# Patient Record
Sex: Male | Born: 1950 | Race: Black or African American | Hispanic: No | Marital: Married | Smoking: Former smoker
Health system: Southern US, Community
[De-identification: ages and names within clinical notes are randomized; demographics above are authoritative.]

## PROBLEM LIST (undated history)

## (undated) DIAGNOSIS — E039 Hypothyroidism, unspecified: Secondary | ICD-10-CM

## (undated) DIAGNOSIS — T7840XA Allergy, unspecified, initial encounter: Secondary | ICD-10-CM

## (undated) DIAGNOSIS — E669 Obesity, unspecified: Secondary | ICD-10-CM

## (undated) DIAGNOSIS — R112 Nausea with vomiting, unspecified: Secondary | ICD-10-CM

## (undated) DIAGNOSIS — I10 Essential (primary) hypertension: Secondary | ICD-10-CM

## (undated) DIAGNOSIS — E119 Type 2 diabetes mellitus without complications: Secondary | ICD-10-CM

## (undated) DIAGNOSIS — G4730 Sleep apnea, unspecified: Secondary | ICD-10-CM

## (undated) DIAGNOSIS — M1990 Unspecified osteoarthritis, unspecified site: Secondary | ICD-10-CM

## (undated) DIAGNOSIS — Z9889 Other specified postprocedural states: Secondary | ICD-10-CM

## (undated) DIAGNOSIS — E785 Hyperlipidemia, unspecified: Secondary | ICD-10-CM

## (undated) DIAGNOSIS — G4733 Obstructive sleep apnea (adult) (pediatric): Secondary | ICD-10-CM

## (undated) DIAGNOSIS — R079 Chest pain, unspecified: Secondary | ICD-10-CM

## (undated) DIAGNOSIS — K219 Gastro-esophageal reflux disease without esophagitis: Secondary | ICD-10-CM

## (undated) HISTORY — DX: Sleep apnea, unspecified: G47.30

## (undated) HISTORY — DX: Unspecified osteoarthritis, unspecified site: M19.90

## (undated) HISTORY — PX: SPINE SURGERY: SHX786

## (undated) HISTORY — DX: Hyperlipidemia, unspecified: E78.5

## (undated) HISTORY — DX: Type 2 diabetes mellitus without complications: E11.9

## (undated) HISTORY — PX: CERVICAL FUSION: SHX112

## (undated) HISTORY — DX: Essential (primary) hypertension: I10

## (undated) HISTORY — DX: Allergy, unspecified, initial encounter: T78.40XA

## (undated) HISTORY — DX: Chest pain, unspecified: R07.9

## (undated) HISTORY — DX: Obesity, unspecified: E66.9

## (undated) HISTORY — PX: CARDIAC CATHETERIZATION: SHX172

## (undated) HISTORY — DX: Obstructive sleep apnea (adult) (pediatric): G47.33

## (undated) HISTORY — PX: HERNIA REPAIR: SHX51

---

## 1982-05-31 HISTORY — PX: KNEE ARTHROSCOPY: SUR90

## 1999-08-14 ENCOUNTER — Encounter: Payer: Self-pay | Admitting: Interventional Cardiology

## 1999-08-14 ENCOUNTER — Emergency Department (HOSPITAL_COMMUNITY): Admission: EM | Admit: 1999-08-14 | Discharge: 1999-08-14 | Payer: Self-pay | Admitting: Emergency Medicine

## 1999-08-25 ENCOUNTER — Ambulatory Visit (HOSPITAL_COMMUNITY): Admission: RE | Admit: 1999-08-25 | Discharge: 1999-08-25 | Payer: Self-pay | Admitting: Interventional Cardiology

## 2008-04-18 ENCOUNTER — Encounter: Admission: RE | Admit: 2008-04-18 | Discharge: 2008-04-18 | Payer: Self-pay | Admitting: Family Medicine

## 2009-05-15 ENCOUNTER — Ambulatory Visit: Payer: Self-pay | Admitting: Vascular Surgery

## 2009-07-16 ENCOUNTER — Ambulatory Visit (HOSPITAL_COMMUNITY): Admission: RE | Admit: 2009-07-16 | Discharge: 2009-07-17 | Payer: Self-pay | Admitting: Orthopaedic Surgery

## 2009-09-19 ENCOUNTER — Emergency Department (HOSPITAL_COMMUNITY): Admission: EM | Admit: 2009-09-19 | Discharge: 2009-09-19 | Payer: Self-pay | Admitting: Emergency Medicine

## 2010-01-02 ENCOUNTER — Emergency Department (HOSPITAL_COMMUNITY): Admission: EM | Admit: 2010-01-02 | Discharge: 2010-01-02 | Payer: Self-pay | Admitting: Emergency Medicine

## 2010-08-14 LAB — COMPREHENSIVE METABOLIC PANEL
AST: 24 U/L (ref 0–37)
Albumin: 3.9 g/dL (ref 3.5–5.2)
Alkaline Phosphatase: 64 U/L (ref 39–117)
BUN: 8 mg/dL (ref 6–23)
CO2: 28 mEq/L (ref 19–32)
GFR calc non Af Amer: >60 mL/min (ref >60)
Glucose, Bld: 148 mg/dL — ABNORMAL HIGH (ref 70–99)
Potassium: 3.7 mEq/L (ref 3.5–5.1)
Sodium: 137 mEq/L (ref 135–145)
Total Bilirubin: 0.6 mg/dL (ref 0.3–1.2)

## 2010-08-14 LAB — URINE MICROSCOPIC-ADD ON

## 2010-08-14 LAB — URINALYSIS, ROUTINE W REFLEX MICROSCOPIC
Glucose, UA: NEGATIVE mg/dL
Ketones, ur: NEGATIVE mg/dL
Urobilinogen, UA: 0.2 mg/dL (ref 0.0–1.0)

## 2010-08-14 LAB — CULTURE, BLOOD (ROUTINE X 2): Culture: NO GROWTH

## 2010-08-14 LAB — DIFFERENTIAL
Basophils Relative: 0 % (ref 0–1)
Lymphs Abs: 1 10*3/uL (ref 0.7–4.0)
Monocytes Absolute: 0.9 10*3/uL (ref 0.1–1.0)
Neutrophils Relative %: 82 % — ABNORMAL HIGH (ref 43–77)

## 2010-08-14 LAB — CBC
MCV: 93.9 fL (ref 78.0–100.0)
Platelets: 151 10*3/uL (ref 150–400)
RBC: 4.59 MIL/uL (ref 4.22–5.81)
RDW: 13.5 % (ref 11.5–15.5)

## 2010-08-18 LAB — DIFFERENTIAL
Basophils Absolute: 0 10*3/uL (ref 0.0–0.1)
Eosinophils Relative: 3 % (ref 0–5)
Lymphs Abs: 2.4 10*3/uL (ref 0.7–4.0)
Monocytes Relative: 8 % (ref 3–12)
Neutrophils Relative %: 45 % (ref 43–77)

## 2010-08-18 LAB — BASIC METABOLIC PANEL
BUN: 6 mg/dL (ref 6–23)
Calcium: 9 mg/dL (ref 8.4–10.5)
Chloride: 106 mEq/L (ref 96–112)
Creatinine, Ser: 0.84 mg/dL (ref 0.4–1.5)
GFR calc Af Amer: >60 mL/min (ref >60)
Potassium: 4.2 mEq/L (ref 3.5–5.1)
Sodium: 141 mEq/L (ref 135–145)

## 2010-08-18 LAB — D-DIMER, QUANTITATIVE: D-Dimer, Quant: 0.33 ug/mL-FEU (ref 0.00–0.48)

## 2010-08-18 LAB — CBC
HCT: 40.5 % (ref 39.0–52.0)
Hemoglobin: 13.7 g/dL (ref 13.0–17.0)
MCHC: 33.8 g/dL (ref 30.0–36.0)

## 2010-08-18 LAB — POCT CARDIAC MARKERS
CKMB, poc: 1.2 ng/mL (ref 1.0–8.0)
Troponin i, poc: <0.05 ng/mL (ref 0.00–0.09)

## 2010-08-21 LAB — COMPREHENSIVE METABOLIC PANEL
ALT: 27 U/L (ref 0–53)
AST: 19 U/L (ref 0–37)
Albumin: 3.9 g/dL (ref 3.5–5.2)
CO2: 26 mEq/L (ref 19–32)
Calcium: 8.6 mg/dL (ref 8.4–10.5)
GFR calc Af Amer: >60 mL/min (ref >60)
GFR calc non Af Amer: >60 mL/min (ref >60)
Potassium: 3.9 mEq/L (ref 3.5–5.1)
Sodium: 135 mEq/L (ref 135–145)
Total Protein: 6.9 g/dL (ref 6.0–8.3)

## 2010-08-21 LAB — CBC
HCT: 42.7 % (ref 39.0–52.0)
MCHC: 34.6 g/dL (ref 30.0–36.0)
Platelets: 150 10*3/uL (ref 150–400)
RBC: 4.5 MIL/uL (ref 4.22–5.81)
RDW: 12.9 % (ref 11.5–15.5)

## 2010-08-21 LAB — URINALYSIS, ROUTINE W REFLEX MICROSCOPIC
Bilirubin Urine: NEGATIVE
Glucose, UA: NEGATIVE mg/dL
Hgb urine dipstick: NEGATIVE
Ketones, ur: NEGATIVE mg/dL
Nitrite: NEGATIVE
Protein, ur: NEGATIVE mg/dL
Specific Gravity, Urine: 1.022 (ref 1.005–1.030)
pH: 7.5 (ref 5.0–8.0)

## 2010-08-21 LAB — GLUCOSE, CAPILLARY

## 2010-10-13 NOTE — Procedures (Signed)
CAROTID DUPLEX EXAM   INDICATION:  Vertigo, dizziness.   HISTORY:  Diabetes:  Yes.  Cardiac:  No.  Hypertension:  Yes.  Smoking:  Previous.  Quit 15 years ago.  Previous Surgery:  No.  CV History:  No.  Amaurosis Fugax No, Paresthesias No, Hemiparesis No                                       RIGHT             LEFT  Brachial systolic pressure:         118               110  Brachial Doppler waveforms:         WNL               WNL  Vertebral direction of flow:        Antegrade         Antegrade  DUPLEX VELOCITIES (cm/sec)  CCA peak systolic                   103               119  ECA peak systolic                   110               104  ICA peak systolic                   83                85  ICA end diastolic                   16                37  PLAQUE MORPHOLOGY:                  Heterogeneous     Heterogeneous  PLAQUE AMOUNT:                      Mild              Mild  PLAQUE LOCATION:                    ICA, CCA          ICA    IMPRESSION:  1. Bilateral internal carotid arteries suggest 1%-39% stenosis.  2. Antegrade flow in bilateral vertebrals.        ___________________________________________  Di Kindle. Edilia Bo, M.D.   CB/MEDQ  D:  05/15/2009  T:  05/15/2009  Job:  [id number]

## 2010-10-13 NOTE — Consult Note (Signed)
VASCULAR SURGERY CONSULTATION   Gregory West, Gregory West A  DOB:  Dec 19, 1950                                       05/15/2009  ZOXWR#:[id number]   I saw the patient in the office today in consultation concerning his  dizziness.  He was referred by Dr. Zachery Dauer.  This is a pleasant 60-year-  old gentleman who states that he noted the gradual onset of dizziness  approximately a year ago.  Over the last 3-6 months his symptoms have  been progressively worse.  He states that he generally experiences  dizziness when he turns his head to the left side.  There are really no  other aggravating or alleviating factors that he is aware of.  He does  have a history of neck pain and has had previous surgery in his neck  back in 1997.  He believes he had a fusion in his neck with bone taken  from the hip.  He has had no history of stroke, TIAs, expressive or  receptive aphasia or amaurosis fugax.  He is unaware of any history of  anemia or inner ear problems.   PAST MEDICAL HISTORY:  Significant for type 2 diabetes.  He does take  insulin.  He denies any history of hypertension, hypercholesterolemia,  history of previous myocardial infarction, history of congestive heart  failure or history of COPD.   PAST SURGICAL HISTORY:  Significant for neck surgery in 1997, surgery on  both feet in 1989, surgery on both knees in 1984 and a hernia repair in  1972.   FAMILY HISTORY:  His mother had a stroke at age 77 and again at age 13.  He is unaware of any history of premature cardiovascular disease.   SOCIAL HISTORY:  He is married.  He has 2 children.  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  He quit tobacco in 1995.  He had  smoked up to 2 packs per day for 10 years and smoked for a total of 30  years total.   ALLERGIES:  Are Crestor and Lipitor which cause myalgias.   MEDICATIONS:  Are documented on the medical history form in his chart.   REVIEW OF SYSTEMS:  GENERAL:  He  has had no weight loss, weight gain or  problems with his appetite.  CARDIOVASCULAR:  He has had no chest pain, chest pressure, palpitations  or arrhythmias.  He has had some mild claudication in his legs with no  rest pain or history of nonhealing ulcers.  He has had no history of DVT  or phlebitis.  GU:  He has occasional urinary frequency.  NEUROLOGIC:  He has occasional headaches in the posterior aspect of his  head and occasional frontal headaches.  He has had no blackouts or  seizures.  He has had the dizziness as stated above.  MUSCULOSKELETAL:  He has had arthritis and joint pain.  ENT:  He has had occasional nosebleeds.  Pulmonary, GI, psychiatric, hematologic, skin review of systems is  unremarkable and is documented on the medical history form in his chart.   PHYSICAL EXAMINATION:  General:  This is a pleasant 60 year old  gentleman who appears his stated age.  Vital signs:  Blood pressure is  102/67 in the right arm and 109/72 in the left arm.  Heart rate is 73,  temperature is 98.  HEENT:  Pupils are equal, round, reactive to light  and accommodation.  Extraocular motions are intact.  Conjunctivae are  normal.  Neck:  There is no jugular venous distention.  Lungs:  Are  clear bilaterally to auscultation without rales, rhonchi or wheezing.  Cardiovascular:  I do not detect any carotid bruits.  He has a regular  rate and rhythm without murmur or gallop appreciated.  He has no  significant peripheral edema.  He has palpable radial, femoral and  posterior tibial pulses bilaterally.  Abdomen:  Soft and nontender with  no masses appreciated.  No aneurysm appreciated.  He has normal pitched  bowel sounds.  Musculoskeletal:  There are no major deformities or  cyanosis.  Neurological:  He has no focal weakness or paresthesias.  Skin:  There are no ulcers or rashes.  Lymphatic:  He has no significant  cervical, axillary or inguinal lymphadenopathy.   He did have a carotid duplex  scan in our office today which I  independently interpreted and this shows essentially equal arm pressures  with normally directed flow in both vertebral arteries.  Thus there was  no evidence of vertebrobasilar insufficiency.  He has no evidence of  carotid stenosis on either side.  I have also reviewed his previous  laboratory evaluation done by his primary care physician which shows a  cholesterol level of 199 with an LDL of 130.  His creatinine is 0.9.   IMPRESSION:  Based on his carotid duplex scan he has no evidence of  vertebrobasilar insufficiency or subclavian steal syndrome.  He has no  carotid disease either.  Thus I think it is unlikely that he has a  vascular source for his dizziness although it is somewhat suspicious  that he experienced these symptoms only when he turns his head to the  left side.  I think more likely his symptoms could be related to his  disk problems in his neck and he is going to try to follow up with his  orthopedic surgeon concerning this.  This could also potentially explain  his headaches.  If no other potential source for his dizziness could be  found the only other potential test we could consider would be a  cerebral arteriogram in which we could obtain images with him neutral  and then with his head turned to the left to see if there was any  evidence of kinking although I think the yield of this would be quite  low.  In addition, this would be associated with small risk of cerebral  arteriography including a 1-2% risk of stroke.  He is agreeable with  this plan.  I will be happy to see him back at any time if his symptoms  persist and no other etiology can be found.   Di Kindle. Edilia Bo, M.D.  Electronically Signed  CSD/MEDQ  D:  05/15/2009  T:  05/16/2009  Job:  [id number]   cc:   Dossie Der, MD

## 2010-10-16 NOTE — Cardiovascular Report (Signed)
[place]. [facility]  Patient:    Gregory West, Gregory West                   MRN: [id number] Proc. Date: 08/25/99 Attending:  Darci Needle, M.D. CC:         Anna Genre. Little, M.D.                        Cardiac Catheterization  CINE NUMBER:  [id number]  PROCEDURE: 1. Left heart catheterization. 2. Selective coronary angiography. 3. Left ventriculography.  INDICATIONS:  [name] is 60 years of age, and has been having atypical chest pain.  He was referred for a Cardiolite study that demonstrated perfusion abnormalities that probably represented soft tissue attenuation, although with continued symptoms with indeterminate scan, and risks factors for coronary disease, it was felt that coronary angiography was necessary to exclude important coronary obstructive disease.  DESCRIPTION OF PROCEDURE:  After informed consent, a 6-French sheath was inserted into the right femoral artery using the modified Seldinger technique.  A 6-French A2 multipurpose catheter was then used for hemodynamic recordings, left ventriculography, and selective left and right coronary angiography.  Perclose as used for percutaneous arteriotomy closure.  No complications occurred.  1 g of Ancef was given IV postprocedure.  RESULTS:  HEMODYNAMIC DATA:  Aortic pressure 111/68, left ventricular pressure 114/17 mmHg.  LEFT VENTRICULOGRAPHY:  The left ventricle was normal size and demonstrated normal contractility with ejection fraction of 65%.  SELECTIVE CORONARY ANGIOGRAPHY:  Left main coronary normal.  Left anterior descending coronary - the left anterior descending coronary artery was a large vessel that wraps around the left ventricular apex.  It gives origin to one significant diagonal branch, and two smaller more difficult diagonal branches. The entire LAD system is normal.  Ramus branch - a large bifurcating ramus intermedius coronary arises from  the left main and was free of any significant obstruction.  Circumflex artery - this vessel is large and gives origin to three moderate size obtuse marginal branches.  The circumflex system is normal.  Right coronary artery is relatively small.  Small PDA arises distally.  The vessel is free of any coronary atherosclerosis.  CONCLUSIONS: 1. Normal coronary arteries. 2. Normal left ventricular function.  RECOMMENDATIONS:  No further cardiac evaluation.  Suspect that chest discomfort is gastrointestinal or musculoskeletal.  No evidence of coronary disease was identified. DD:  08/25/99 TD:  08/25/99 Job: [id number]

## 2010-10-16 NOTE — H&P (Signed)
[place]. [facility]  Patient:    Gregory West, Gregory West                 MRN: [id number] Adm. Date:  [id number] Attending:  Lyn Records. Iii Dictator:   Anselm Lis, N.P. CC:         Anna Genre. Little, M.D., [facility]                         History and Physical  DATE OF PROCEDURE:  August 25, 1999.  HISTORY OF PRESENT ILLNESS:  Gregory West is a very pleasant 60 year old male  with possible history for dyslipidemia who was experiencing atypical chest discomfort in right anterior chest which radiates to the substernal area and is  described as sharp, initially transient but then increasing in duration, though it does have a waxing and waning quality to it.  About one month ago, however, he began  experiencing more of a typical anginal symptoms of left anterior chest pressure though not particularly associated with activity.  It can occur with exertion.  He has noted increasing wakening fatigue over the last 12 years possibly related to symptoms suspicious for sleep apnea.  The patient had followup stress Cardiolite in clinic which was indeterminate for CAD, although there was some evidence of possible inferior wall and anterior wall possibly related to attenuation, though ischemia could not be excluded.  PAST MEDICAL HISTORY:  Possible sleep apnea; investigation for this is pending completion of this cardiac workup.  He denies history of hypertension, diabetes mellitus, thyroid disease, and asthma.  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  ALLERGIES:  MULBERRY TREES.  Okay with seafood/shellfish and ______ products. No known allergies to medications.  MEDICATIONS:  Protonix 40 mg p.o. q.d.  SOCIAL HISTORY AND HABITS:  He has been married for 25 years and has a daughter and son  alive and well.  He works in Materials engineer, often working off site. Tobacco: Negative.  ETOH: Negative.  Caffeine: Not excessive.  FAMILY HISTORY:  His dad is 63 and has Parkinsons disease.  His mother is 41, has history of diabetes mellitus and hypothyroidism/goiter.  His mom did have a mild heart attack in her mid 15s.  He has three sisters without CAD.  One brother died of suicide at age 46.  REVIEW OF SYSTEMS:  Episodic lightheadedness associated with fast position changes, otherwise no significant complaints.  Negative dysphagia to food or fluid.  No complaints of diarrhea, melena, nor bright red blood per rectum.  No dysuria nor hematuria.  He denies arthritic-type complaints.  The patient does suffer from daytime fatigue; his wife states he snores wildly at night, and he is currently  undergoing workup for sleep apnea.  Of most concern to his family is increased pigmentation on his face and lower extremities noted to be gradually progressive since his 26s.  PHYSICAL EXAMINATION:  VITAL SIGNS:  Blood pressure 127/69, heart rate 78, respiratory rate 20, temperature 97.9.  He is 5 feet 9-1/2 inches and weighs 290 pounds.  GENERAL:  He is a moderately overweight, 60 year old male in no apparent distress. He is accompanied today by his wife and daughter.  NECK:  Brisk bilateral upstrokes without bruit.  No significant JVD nor thyromegaly.  CHEST:  Lung sounds clear with equal bilateral excursion.  Negative CVA tenderness.  CARDIAC:  Regular rate and rhythm without murmur, rub, or gallop.  Normal S1 and S2.  ABDOMEN:  Soft, nondistended, normoactive bowel sounds.  Negative abdominal aortic, renal, and femoral bruit.  EXTREMITIES:   Distal pulses intact.  Alert and oriented x 3.  GENITOURINARY/RECTAL:  Deferred.  LABORATORY TESTS AND DATA:  Stress Cardiolite from August 18, 1999, indeterminate with decreased uptake in the inferior wall and the anterior  wall, possibly related to attenuation, although myocardial ischemia could not be excluded particularly  involving the inferior wall.  Normal left ventricular function with ejection fraction of 55%.  Chest x-ray from August 14, 1999, was without acute abnormality.  Lab work from August 24, 1999, revealed glucose 95, BUN 10, creatinine 1.0, sodium 138, potassium 4.1, chloride 100, CO2 29, calcium 8.7.  LFTs within normal range though alkaline phosphatase slightly elevated at 66.  Hemoglobin 15.3, hematocrit 44.9, platelets 179, WBC 4.3.  Pro time 11.9 with INR 1.03 and PTT of 33.  His EKG reveals normal sinus rhythm at 78 beats per minute without ischemic changes.  IMPRESSION: 1. Chest discomfort, atypical and typical features for angina pectoris in this    60 year old obese male with possible history of dyslipidemia and with    borderline suspicious followup myocardial scan. 2. Question of dyslipidemia. 3. ______ .  PLAN:  The patient has been counselled to undergo and has accepted plans for coronary angiography with possible percutaneous intervention if indicated and able.  Risks, potential complications, benefits, and alternatives of the procedure were discussed in detail with the patient and his wife.  The Cunninghams indicate their questions and concerns have been addressed and are agreeable to proceed. DD:  08/25/99 TD:  08/25/99 Job: [id number]

## 2011-08-30 ENCOUNTER — Other Ambulatory Visit: Payer: Self-pay | Admitting: Endocrinology

## 2011-08-30 DIAGNOSIS — E291 Testicular hypofunction: Secondary | ICD-10-CM

## 2011-09-06 ENCOUNTER — Ambulatory Visit
Admission: RE | Admit: 2011-09-06 | Discharge: 2011-09-06 | Disposition: A | Discharge status: Home / Self Care | Payer: 59 | Source: Ambulatory Visit | Attending: Endocrinology | Admitting: Endocrinology

## 2011-09-06 DIAGNOSIS — E291 Testicular hypofunction: Secondary | ICD-10-CM

## 2011-09-06 MED ORDER — GADOBENATE DIMEGLUMINE 529 MG/ML IV SOLN
15.0000 mL | Freq: Once | INTRAVENOUS | Status: AC | PRN
  Administered 2011-09-06: 15 mL via INTRAVENOUS

## 2011-10-21 DIAGNOSIS — H40009 Preglaucoma, unspecified, unspecified eye: Secondary | ICD-10-CM | POA: Insufficient documentation

## 2012-05-17 ENCOUNTER — Encounter: Payer: Self-pay | Admitting: Vascular Surgery

## 2013-12-24 ENCOUNTER — Ambulatory Visit (INDEPENDENT_AMBULATORY_CARE_PROVIDER_SITE_OTHER): Payer: 59 | Admitting: Family Medicine

## 2013-12-24 ENCOUNTER — Ambulatory Visit (INDEPENDENT_AMBULATORY_CARE_PROVIDER_SITE_OTHER): Payer: 59

## 2013-12-24 VITALS — BP 118/80 | HR 85 | Temp 97.7°F | Resp 18 | Ht 70.5 in | Wt 287.2 lb

## 2013-12-24 DIAGNOSIS — S93601A Unspecified sprain of right foot, initial encounter: Secondary | ICD-10-CM

## 2013-12-24 DIAGNOSIS — M79671 Pain in right foot: Secondary | ICD-10-CM

## 2013-12-24 DIAGNOSIS — S93609A Unspecified sprain of unspecified foot, initial encounter: Secondary | ICD-10-CM

## 2013-12-24 DIAGNOSIS — M79609 Pain in unspecified limb: Secondary | ICD-10-CM

## 2013-12-24 MED ORDER — TRAMADOL HCL 50 MG PO TABS: 50.0000 mg | ORAL_TABLET | Freq: Three times a day (TID) | ORAL | Status: DC | PRN

## 2013-12-24 NOTE — Progress Notes (Addendum)
[facility] [address], [place] [address] [phone]  Date:  12/24/2013   Name:  Gregory ReadyRichard A West   DOB:  1950/08/08   MRN:  [id number]  PCP:  No primary provider on file.    Chief Complaint: Foot Injury   History of Present Illness:  Gregory West is a 63 y.o. very pleasant male patient who presents with the following:  Here today as a new pt with a foot injury.  He was playing softball yestreday and hurt his right foot and ankle.  He was trying to catch a fly ball and somehow hurt his right foot and ankle.  He is not quite sure of what he did but he had immediate pain, and now has pain mostly in the lateral foot.   He can walk on it "barely."  He is limping He has had surgery on his right foot for a congenital problem in 1989; he has a pin somewhere.  Not quite sure what type of surgery he had. No other injury.    He sees Dr. Osa CraverAlthmeimer for his DM.   There are no active problems to display for this patient.   Past Medical History  Diagnosis Date  . Arthritis   . Diabetes mellitus without complication     Past Surgical History  Procedure Laterality Date  . Hernia repair    . Knee arthroscopy  1984    both knee    History  Substance Use Topics  . Smoking status: Never Smoker   . Smokeless tobacco: Not on file  . Alcohol Use: No    Family History  Problem Relation Age of Onset  . Diabetes Mother   . Heart disease Mother   . Hypertension Mother   . Stroke Mother   . Cancer Father   . Heart disease Father   . Hyperlipidemia Sister   . Hypertension Sister   . Diabetes Maternal Grandmother   . Stroke Maternal Grandmother   . Diabetes Maternal Grandfather   . Hypertension Maternal Grandfather   . Diabetes Sister   . Hypertension Sister     Allergies  Allergen Reactions  . Statins     Cramps    Medication list has been reviewed and updated.  No current outpatient prescriptions on file prior to visit.   No  current facility-administered medications on file prior to visit.    Review of Systems:  As per HPI- otherwise negative.   Physical Examination: Filed Vitals:   12/24/13 1534  BP: 118/80  Pulse: 85  Temp: 97.7 F (36.5 C)  Resp: 18   Filed Vitals:   12/24/13 1534  Height: 5' 10.5" (1.791 m)  Weight: 287 lb 3.2 oz (130.273 kg)   Body mass index is 40.61 kg/(m^2). Ideal Body Weight: Weight in (lb) to have BMI = 25: 176.4  GEN: WDWN, NAD, Non-toxic, A & O x 3, obese, looks well HEENT: Atraumatic, Normocephalic. Neck supple. No masses, No LAD. Ears and Nose: No external deformity. CV: RRR, No M/G/R. No JVD. No thrill. No extra heart sounds. PULM: CTA B, no wheezes, crackles, rhonchi. No retractions. No resp. distress. No accessory muscle use. EXTR: No c/c/e NEURO favoring right foot PSYCH: Normally interactive. Conversant. Not depressed or anxious appearing.  Calm demeanor.  Right foot: quite tender over the 5th MT.  No swelling or bruise.  Mild tenderness lateral ankle.  Achilles is intact.  Normal sensation and perfusion of toes   [facility] reading (PRIMARY)  by  Dr. Patsy Lager. Right ankle: negative Right foot: post- op changes of 5th MT.  There are some calcification adjacent to the proximal 5th MT- likely chronic but cannot exclude a new avulsion fracture  RIGHT ANKLE - COMPLETE 3+ VIEW  COMPARISON: None.  FINDINGS: Three views of the right ankle submitted. No acute fracture or subluxation. Ankle mortise is preserved. There is posterior spurring of calcaneus.  IMPRESSION: Negative.  CLINICAL DATA: Right foot injury, pain.  EXAM: RIGHT FOOT COMPLETE - 3+ VIEW  COMPARISON: None.  FINDINGS: There is no acute bony or joint abnormality. Screw in the head of the fifth metatarsal is noted. 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 Soft tissue structures are unremarkable.  IMPRESSION: No acute  finding.  First MTP and midfoot degenerative change.  Postoperative change head of the fifth metatarsal.  Given crutches and a CAM boot; the boot felt very good to him Assessment and Plan: Right foot pain - Plan: DG Foot Complete Right, DG Ankle Complete Right  Right foot sprain, initial encounter - Plan: traMADol (ULTRAM) 50 MG tablet  Foot sprain with possible 5th MT avulsion fracture Felt much better in CAM boot.  Use this and crutches, tramadol as needed for more severe pain.  Follow-up if not better in the next 5-7 days.    Signed Abbe Amsterdam, MD

## 2013-12-24 NOTE — Patient Instructions (Signed)
Use your boot and crutches as needed.  Ice and elevate your foot when you can.    Please come and see me if your foot is still bothering you in about one week Use the tramadol as needed for pain- remember it can make you drowsy Do NOT drive with your boot on!

## 2013-12-25 ENCOUNTER — Encounter: Payer: Self-pay | Admitting: Family Medicine

## 2013-12-31 ENCOUNTER — Ambulatory Visit (INDEPENDENT_AMBULATORY_CARE_PROVIDER_SITE_OTHER): Payer: 59 | Admitting: Family Medicine

## 2013-12-31 VITALS — BP 104/70 | HR 77 | Temp 98.1°F | Resp 16 | Ht 70.0 in | Wt 289.0 lb

## 2013-12-31 DIAGNOSIS — Z5189 Encounter for other specified aftercare: Secondary | ICD-10-CM

## 2013-12-31 DIAGNOSIS — M25579 Pain in unspecified ankle and joints of unspecified foot: Secondary | ICD-10-CM

## 2013-12-31 DIAGNOSIS — S93601D Unspecified sprain of right foot, subsequent encounter: Secondary | ICD-10-CM

## 2013-12-31 DIAGNOSIS — Z8739 Personal history of other diseases of the musculoskeletal system and connective tissue: Secondary | ICD-10-CM

## 2013-12-31 DIAGNOSIS — Z8639 Personal history of other endocrine, nutritional and metabolic disease: Secondary | ICD-10-CM

## 2013-12-31 DIAGNOSIS — S93609A Unspecified sprain of unspecified foot, initial encounter: Secondary | ICD-10-CM

## 2013-12-31 DIAGNOSIS — M25571 Pain in right ankle and joints of right foot: Secondary | ICD-10-CM

## 2013-12-31 DIAGNOSIS — Z862 Personal history of diseases of the blood and blood-forming organs and certain disorders involving the immune mechanism: Secondary | ICD-10-CM

## 2013-12-31 LAB — POCT SEDIMENTATION RATE: POCT SED RATE: 30 mm/hr — AB (ref 0–22)

## 2013-12-31 MED ORDER — TRAMADOL HCL 50 MG PO TABS: 50.0000 mg | ORAL_TABLET | Freq: Three times a day (TID) | ORAL | Status: DC | PRN

## 2013-12-31 MED ORDER — COLCHICINE 0.6 MG PO TABS: ORAL_TABLET | ORAL | Status: DC

## 2013-12-31 NOTE — Progress Notes (Signed)
Subjective: Patient was here last week after injuring his foot and ankle when playing softball. He's been wearing his cam walker and using crutches. He did okay for a few days, then the last 2 days she's had increased pain which has been pretty severe in the fourth and fifth digits.  [facility].  Objective: The x-rays from last visit were negative. I review the films and did not see any fractures. Ankle has fair range of motion and is nontender. The heart has a little tenderness. There is mild erythema on the top of her foot wear the Cam Walker has given some pressure in 2 spots. The right fourth and fifth MTP joints are very tender.  Assessment: Suspicion for gout Foot pain History of possible gout  Plan: Colchicine RTC if not improving  Re xray if needed next week

## 2013-12-31 NOTE — Patient Instructions (Addendum)
Take colchicine 2 pills initially, then one twice daily. May cause diarrhea. If necessary decrease dose.  Use the tramadol for pain  Return if not much better over the next week please return  We will let you know the results of your labs

## 2014-01-01 LAB — URIC ACID: URIC ACID, SERUM: 3.8 mg/dL — AB (ref 4.0–7.8)

## 2014-05-15 ENCOUNTER — Encounter: Payer: Self-pay | Admitting: Interventional Cardiology

## 2014-05-15 ENCOUNTER — Ambulatory Visit (INDEPENDENT_AMBULATORY_CARE_PROVIDER_SITE_OTHER): Payer: 59 | Admitting: Interventional Cardiology

## 2014-05-15 VITALS — BP 118/62 | HR 76 | Ht 70.0 in | Wt 280.8 lb

## 2014-05-15 DIAGNOSIS — R0789 Other chest pain: Secondary | ICD-10-CM | POA: Insufficient documentation

## 2014-05-15 DIAGNOSIS — G4733 Obstructive sleep apnea (adult) (pediatric): Secondary | ICD-10-CM

## 2014-05-15 DIAGNOSIS — E785 Hyperlipidemia, unspecified: Secondary | ICD-10-CM

## 2014-05-15 DIAGNOSIS — E1149 Type 2 diabetes mellitus with other diabetic neurological complication: Secondary | ICD-10-CM | POA: Insufficient documentation

## 2014-05-15 DIAGNOSIS — E118 Type 2 diabetes mellitus with unspecified complications: Secondary | ICD-10-CM

## 2014-05-15 NOTE — Progress Notes (Signed)
Patient ID: Gregory Readyichard A Fern, male   DOB: 1950-09-09, 63 y.o.   MRN: [id number]    [address] [place], [place]  [address] Phone: [phone]) [phone] Fax:  [phone]  Date:  05/15/2014   ID:  Gregory ReadyRichard A Kwiecinski, DOB 1950-09-09, MRN [id number]  PCP:  Gaye AlkenBARNES,ELIZABETH STEWART, MD   ASSESSMENT:  1. Chest pain with exertion and at rest 2. Diabetes mellitus 3. Hyperlipidemia 4. Family history of coronary artery disease  PLAN:  1. Exercise treadmill test   SUBJECTIVE: Gregory West is a 63 y.o. male who is 63 years of age. His been previously evaluated by me but this is been greater than 6 years ago. When I saw him on his last visit was for exertional dyspnea. More recently he has been having chest discomfort with activity and at rest. The discomfort is poorly characterized. He did have a nuclear stress test performed in 2001 that was "abnormal". This led to coronary angiography which revealed normal arteries. He subsequently underwent an excised treadmill test in 2012 with indication being vague chest discomfort. The test was negative for evidence of ischemia. He is now back with a complaint of chest discomfort. There is some present with exertion but can also occur at rest. Episodes can last hours but also can be only for seconds to a minute or so. There is no radiation. He does have dyspnea on exertion. He has neck and arm discomfort as seem to be dissociated from chest discomfort complaint.   Wt Readings from Last 3 Encounters:  05/15/14 280 lb 12.8 oz (127.37 kg)  12/31/13 289 lb (131.09 kg)  12/24/13 287 lb 3.2 oz (130.273 kg)     Past Medical History  Diagnosis Date  . Arthritis   . Diabetes mellitus without complication   . Obstructive sleep apnea 05/15/2014    Currently untreated   . Hyperlipidemia 05/15/2014    Current Outpatient Prescriptions  Medication Sig Dispense Refill  . aspirin 81 MG tablet Take 81 mg by mouth daily.    . Canagliflozin  (INVOKANA) 300 MG TABS Take by mouth.    . colchicine 0.6 MG tablet Take 2 initially, then 1 twice daily for probable gout 20 tablet 0  . etodolac (LODINE XL) 500 MG 24 hr tablet Take 500 mg by mouth daily.    . insulin glargine (LANTUS) 100 UNIT/ML injection Inject 20 Units into the skin at bedtime.    Marland Kitchen. LIVALO 2 MG TABS Take 1 tablet by mouth.     . metFORMIN (GLUCOPHAGE) 500 MG tablet Take 1,500 mg by mouth 2 (two) times daily with a meal.    . pioglitazone (ACTOS) 15 MG tablet Take 15 mg by mouth daily.     . traMADol (ULTRAM) 50 MG tablet Take 1 tablet (50 mg total) by mouth every 8 (eight) hours as needed. 30 tablet 0  . Vitamin D, Ergocalciferol, (DRISDOL) 50000 UNITS CAPS capsule Take 50,000 Units by mouth every 7 (seven) days.     No current facility-administered medications for this visit.    Allergies:    Allergies  Allergen Reactions  . Statins     Muscle cramping all over body.    Social History:  The patient  reports that he has never smoked. He does not have any smokeless tobacco history on file. He reports that he does not drink alcohol or use illicit drugs.   ROS:  Please see the history of present illness.   Denies claudication, orthopnea, PND,  wheezing, syncope, palpitations, edema, transient neurological symptoms, and cough with wheezing. Does have sleep apnea but is not using his C Pap device.   All other systems reviewed and negative.   OBJECTIVE: VS:  BP 118/62 mmHg  Pulse 76  Ht 5\' 10"  (1.778 m)  Wt 280 lb 12.8 oz (127.37 kg)  BMI 40.29 kg/m2 Well nourished, well developed, in no acute distress, will be obesity HEENT: normal Neck: JVD flat. Carotid bruit absent  Cardiac:  normal S1, S2; RRR; no murmur Lungs:  clear to auscultation bilaterally, no wheezing, rhonchi or rales Abd: soft, nontender, no hepatomegaly Ext: Edema trace bilateral. Pulses 2+ and symmetric Skin: warm and dry Neuro:  CNs 2-12 intact, no focal abnormalities noted  EKG:  Normal sinus  rhythm with normal appearance to the tracing       Signed, Darci NeedleHenry W. B. Smith III, MD 05/15/2014 2:08 PM

## 2014-05-15 NOTE — Patient Instructions (Signed)
Your physician recommends that you continue on your current medications as directed. Please refer to the Current Medication list given to you today.  Your physician has requested that you have an exercise tolerance test. For further information please visit [email or web address]. Please also follow instruction sheet, as given.  Your physician recommends that you schedule a follow-up appointment pending results  

## 2014-06-27 ENCOUNTER — Ambulatory Visit (INDEPENDENT_AMBULATORY_CARE_PROVIDER_SITE_OTHER): Payer: 59 | Admitting: Physician Assistant

## 2014-06-27 DIAGNOSIS — R0789 Other chest pain: Secondary | ICD-10-CM

## 2014-06-27 NOTE — Progress Notes (Signed)
Exercise Treadmill Test  Pre-Exercise Testing Evaluation Rhythm: normal sinus  Rate: 87 bpm     Test  Exercise Tolerance Test Ordering MD: Verdis PrimeHenry Smith, MD  Interpreting MD: Tereso NewcomerScott Easter Kennebrew, PA-C  Unique Test No: 1  Treadmill:  1  Indication for ETT: chest pain - rule out ischemia  Contraindication to ETT: No   Stress Modality: exercise - treadmill  Cardiac Imaging Performed: non   Protocol: standard Bruce - maximal  Max BP:  230/82  Max MPHR (bpm):  158 85% MPR (bpm):  134  MPHR obtained (bpm):  137 % MPHR obtained:  86  Reached 85% MPHR (min:sec):  2:42 Total Exercise Time (min-sec):  3:00  Workload in METS:  4.6 Borg Scale: 15  Reason ETT Terminated:  exaggerated hypertensive response    ST Segment Analysis At Rest: normal ST segments - no evidence of significant ST depression With Exercise: non-specific ST changes  Other Information Arrhythmia:  No Angina during ETT:  present (1) Quality of ETT:  diagnostic  ETT Interpretation:  normal - no evidence of ischemia by ST analysis  Comments: Fair exercise capacity. Patient did c/o chest pain ("burning"). Exaggerated hypertensive BP response to exercise (138/95 >> 230/82). Insignificant up-sloping ST depression.  No significant ST changes to suggest ischemia.  Recommendations: FU with Dr. Verdis PrimeHenry West as directed. Monitor BP and FU with PCP. Signed,  Tereso NewcomerScott Safir Michalec, PA-C   06/27/2014 9:29 AM

## 2014-08-08 ENCOUNTER — Emergency Department (HOSPITAL_COMMUNITY): Payer: 59

## 2014-08-08 ENCOUNTER — Emergency Department (HOSPITAL_COMMUNITY)
Admission: EM | Admit: 2014-08-08 | Discharge: 2014-08-08 | Disposition: A | Discharge status: Home / Self Care | Payer: 59 | Attending: Emergency Medicine | Admitting: Emergency Medicine

## 2014-08-08 ENCOUNTER — Encounter (HOSPITAL_COMMUNITY): Payer: Self-pay | Admitting: *Deleted

## 2014-08-08 DIAGNOSIS — Z794 Long term (current) use of insulin: Secondary | ICD-10-CM | POA: Insufficient documentation

## 2014-08-08 DIAGNOSIS — R42 Dizziness and giddiness: Secondary | ICD-10-CM | POA: Diagnosis not present

## 2014-08-08 DIAGNOSIS — E119 Type 2 diabetes mellitus without complications: Secondary | ICD-10-CM | POA: Diagnosis not present

## 2014-08-08 DIAGNOSIS — Z7982 Long term (current) use of aspirin: Secondary | ICD-10-CM | POA: Diagnosis not present

## 2014-08-08 DIAGNOSIS — Z79899 Other long term (current) drug therapy: Secondary | ICD-10-CM | POA: Insufficient documentation

## 2014-08-08 DIAGNOSIS — M1990 Unspecified osteoarthritis, unspecified site: Secondary | ICD-10-CM | POA: Insufficient documentation

## 2014-08-08 DIAGNOSIS — R079 Chest pain, unspecified: Secondary | ICD-10-CM

## 2014-08-08 DIAGNOSIS — R0789 Other chest pain: Secondary | ICD-10-CM | POA: Diagnosis not present

## 2014-08-08 DIAGNOSIS — Z8669 Personal history of other diseases of the nervous system and sense organs: Secondary | ICD-10-CM | POA: Insufficient documentation

## 2014-08-08 LAB — COMPREHENSIVE METABOLIC PANEL
ALBUMIN: 3.4 g/dL — AB (ref 3.5–5.2)
ALK PHOS: 72 U/L (ref 39–117)
ALT: 24 U/L (ref 0–53)
AST: 21 U/L (ref 0–37)
Anion gap: 3 — ABNORMAL LOW (ref 5–15)
BILIRUBIN TOTAL: 0.4 mg/dL (ref 0.3–1.2)
BUN: 10 mg/dL (ref 6–23)
CHLORIDE: 108 mmol/L (ref 96–112)
CO2: 28 mmol/L (ref 19–32)
CREATININE: 0.93 mg/dL (ref 0.50–1.35)
Calcium: 8.7 mg/dL (ref 8.4–10.5)
GFR calc Af Amer: >90 mL/min (ref >90)
GFR, EST NON AFRICAN AMERICAN: 88 mL/min — AB (ref >90)
Glucose, Bld: 131 mg/dL — ABNORMAL HIGH (ref 70–99)
Potassium: 3.9 mmol/L (ref 3.5–5.1)
Sodium: 139 mmol/L (ref 135–145)
TOTAL PROTEIN: 6.9 g/dL (ref 6.0–8.3)

## 2014-08-08 LAB — CBC
HEMATOCRIT: 39.7 % (ref 39.0–52.0)
Hemoglobin: 13.1 g/dL (ref 13.0–17.0)
MCH: 29.1 pg (ref 26.0–34.0)
MCHC: 33 g/dL (ref 30.0–36.0)
MCV: 88.2 fL (ref 78.0–100.0)
PLATELETS: 164 10*3/uL (ref 150–400)
RBC: 4.5 MIL/uL (ref 4.22–5.81)
RDW: 14.2 % (ref 11.5–15.5)
WBC: 4.2 10*3/uL (ref 4.0–10.5)

## 2014-08-08 LAB — I-STAT TROPONIN, ED
TROPONIN I, POC: 0 ng/mL (ref 0.00–0.08)
Troponin i, poc: 0 ng/mL (ref 0.00–0.08)

## 2014-08-08 LAB — D-DIMER, QUANTITATIVE (NOT AT ARMC)

## 2014-08-08 NOTE — ED Notes (Signed)
Pt with L sided chest pain when he woke up this am, accompanied by dizziness.   Denies nausea or sob.

## 2014-08-08 NOTE — ED Provider Notes (Signed)
CSN: [id number]     Arrival date & time 08/08/14  1153 History   First MD Initiated Contact with Patient 08/08/14 1237     Chief Complaint  Patient presents with  . Chest Pain     (Consider location/radiation/quality/duration/timing/severity/associated sxs/prior Treatment) HPI Comments: Patient is a 64 year old male with a past medical history of diabetes, OSA, and hyperlipidemia who presents with chest pain that started around 4:30am this morning. The pain started suddenly and remained constant since the onset.  The pain is sharp and moderate to severe and located in his left chest with radiation to his left shoulder blade. He reports associated lightheadedness. Turning his head to the right makes the pain worse. No alleviating factors. No diaphoresis, nausea, or SOB.    Past Medical History  Diagnosis Date  . Arthritis   . Diabetes mellitus without complication   . Obstructive sleep apnea 05/15/2014    Currently untreated   . Hyperlipidemia 05/15/2014   Past Surgical History  Procedure Laterality Date  . Hernia repair    . Knee arthroscopy  1984    both knee  . Cervical fusion     Family History  Problem Relation Age of Onset  . Diabetes Mother   . Heart disease Mother   . Hypertension Mother   . Stroke Mother   . Cancer Father   . Heart disease Father   . Hyperlipidemia Sister   . Hypertension Sister   . Diabetes Maternal Grandmother   . Stroke Maternal Grandmother   . Diabetes Maternal Grandfather   . Hypertension Maternal Grandfather   . Diabetes Sister   . Hypertension Sister    History  Substance Use Topics  . Smoking status: Never Smoker   . Smokeless tobacco: Not on file  . Alcohol Use: No    Review of Systems  Constitutional: Negative for fever, chills and fatigue.  HENT: Negative for trouble swallowing.   Eyes: Negative for visual disturbance.  Respiratory: Negative for shortness of breath.   Cardiovascular: Positive for chest pain. Negative for  palpitations.  Gastrointestinal: Negative for nausea, vomiting, abdominal pain and diarrhea.  Genitourinary: Negative for dysuria and difficulty urinating.  Musculoskeletal: Negative for arthralgias and neck pain.  Skin: Negative for color change.  Neurological: Positive for light-headedness. Negative for dizziness and weakness.  Psychiatric/Behavioral: Negative for dysphoric mood.      Allergies  Statins  Home Medications   Prior to Admission medications   Medication Sig Start Date End Date Taking? Authorizing Provider  aspirin 81 MG tablet Take 81 mg by mouth daily.    Historical Provider, MD  Canagliflozin (INVOKANA) 300 MG TABS Take by mouth.    Historical Provider, MD  colchicine 0.6 MG tablet Take 2 initially, then 1 twice daily for probable gout 12/31/13   Peyton Najjaravid H Hopper, MD  etodolac (LODINE XL) 500 MG 24 hr tablet Take 500 mg by mouth daily.    Historical Provider, MD  insulin glargine (LANTUS) 100 UNIT/ML injection Inject 20 Units into the skin at bedtime.    Historical Provider, MD  LIVALO 2 MG TABS Take 1 tablet by mouth.  05/10/14   Historical Provider, MD  metFORMIN (GLUCOPHAGE) 500 MG tablet Take 1,500 mg by mouth 2 (two) times daily with a meal.    Historical Provider, MD  metFORMIN (GLUCOPHAGE-XR) 500 MG 24 hr tablet Take 500 mg by mouth 3 (three) times daily. 07/25/14   Historical Provider, MD  ONETOUCH VERIO test strip  07/30/14  Historical Provider, MD  pioglitazone (ACTOS) 15 MG tablet Take 15 mg by mouth daily.  04/07/14   Historical Provider, MD  traMADol (ULTRAM) 50 MG tablet Take 1 tablet (50 mg total) by mouth every 8 (eight) hours as needed. 12/31/13   Peyton Najjar, MD  Vitamin D, Ergocalciferol, (DRISDOL) 50000 UNITS CAPS capsule Take 50,000 Units by mouth every 7 (seven) days.    Historical Provider, MD   BP 130/58 mmHg  Pulse 72  Temp(Src) 98.2 F (36.8 C) (Oral)  Resp 18  SpO2 93% Physical Exam  Constitutional: He is oriented to person, place, and time.  He appears well-developed and well-nourished. No distress.  HENT:  Head: Normocephalic and atraumatic.  Eyes: Conjunctivae and EOM are normal.  Neck: Normal range of motion.  Cardiovascular: Normal rate and regular rhythm.  Exam reveals no gallop and no friction rub.   No murmur heard. Pulmonary/Chest: Effort normal and breath sounds normal. He has no wheezes. He has no rales. He exhibits no tenderness.  Abdominal: Soft. He exhibits no distension. There is no tenderness. There is no rebound.  Musculoskeletal: Normal range of motion.  1+ pitting edema of bilateral lower extremities.   Neurological: He is alert and oriented to person, place, and time. Coordination normal.  Speech is goal-oriented. Moves limbs without ataxia.   Skin: Skin is warm and dry.  Psychiatric: He has a normal mood and affect. His behavior is normal.  Nursing note and vitals reviewed.   ED Course  Procedures (including critical care time) Labs Review Labs Reviewed  COMPREHENSIVE METABOLIC PANEL - Abnormal; Notable for the following:    Glucose, Bld 131 (*)    Albumin 3.4 (*)    GFR calc non Af Amer 88 (*)    Anion gap 3 (*)    All other components within normal limits  CBC  D-DIMER, QUANTITATIVE  I-STAT TROPOININ, ED  Rosezena Sensor, ED    Imaging Review Dg Chest 2 View  08/08/2014   CLINICAL DATA:  Left-sided chest pain.  Hypertension.  Diabetes.  EXAM: CHEST  2 VIEW  COMPARISON:  01/02/2010  FINDINGS: Midline trachea. Normal heart size and mediastinal contours. No pleural effusion or pneumothorax. Mild biapical pleural thickening. Diffuse peribronchial thickening. Clear lungs.  IMPRESSION: No acute cardiopulmonary disease.  Chronic interstitial thickening. Clinical history describes the patient is a nonsmoker. Therefore, this could relate to chronic bronchitis or asthma.   Electronically Signed   By: Jeronimo Greaves M.D.   On: 08/08/2014 13:46     EKG Interpretation   Date/Time:  Thursday August 08 2014  11:56:19 EST Ventricular Rate:  75 PR Interval:  158 QRS Duration: 82 QT Interval:  390 QTC Calculation: 435 R Axis:   42 Text Interpretation:  Normal sinus rhythm Normal ECG No significant change  since last tracing Confirmed by Rhunette Croft, MD, Janey Genta [phone]) on 08/08/2014  4:07:21 PM      MDM   Final diagnoses:  Chest pain    3:15 PM Patient's labs and chest xray shows no acute changes. Vitals stable and patient afebrile. D-dimer negative. Patient will have delta troponin.   4:14 PM Delta trop negative. Patient will be discharged with PCP follow up.   Emilia Beck, PA-C 08/08/14 1614  Derwood Kaplan, MD 08/10/14 1655

## 2014-08-08 NOTE — Discharge Instructions (Signed)
Follow up with your doctor for further evaluation. Refer to attached documents for more information. Return to the ED with worsening or concerning symptoms.  °

## 2014-11-29 LAB — VITAMIN D 25 HYDROXY (VIT D DEFICIENCY, FRACTURES): Vit D, 25-Hydroxy: 33.3

## 2014-11-29 LAB — HEMOGLOBIN A1C: Hgb A1c MFr Bld: 6.9 % — AB (ref 4.0–6.0)

## 2014-12-30 LAB — LIPID PANEL
Cholesterol: 162 mg/dL (ref 0–200)
HDL: 44 mg/dL (ref 35–70)
LDL Cholesterol: 101 mg/dL
Triglycerides: 85 mg/dL (ref 40–160)

## 2014-12-30 LAB — TESTOSTERONE: TESTOSTERONE: 139

## 2015-03-04 ENCOUNTER — Ambulatory Visit (INDEPENDENT_AMBULATORY_CARE_PROVIDER_SITE_OTHER): Payer: 59 | Admitting: Family Medicine

## 2015-03-04 ENCOUNTER — Encounter: Payer: Self-pay | Admitting: Family Medicine

## 2015-03-04 VITALS — BP 159/92 | HR 74 | Ht 70.0 in | Wt 288.0 lb

## 2015-03-04 DIAGNOSIS — G8929 Other chronic pain: Secondary | ICD-10-CM

## 2015-03-04 DIAGNOSIS — M549 Dorsalgia, unspecified: Secondary | ICD-10-CM

## 2015-03-04 DIAGNOSIS — E118 Type 2 diabetes mellitus with unspecified complications: Secondary | ICD-10-CM | POA: Diagnosis not present

## 2015-03-04 DIAGNOSIS — Z794 Long term (current) use of insulin: Secondary | ICD-10-CM

## 2015-03-04 DIAGNOSIS — E785 Hyperlipidemia, unspecified: Secondary | ICD-10-CM

## 2015-03-04 DIAGNOSIS — G4733 Obstructive sleep apnea (adult) (pediatric): Secondary | ICD-10-CM | POA: Diagnosis not present

## 2015-03-04 DIAGNOSIS — M47816 Spondylosis without myelopathy or radiculopathy, lumbar region: Secondary | ICD-10-CM | POA: Insufficient documentation

## 2015-03-04 NOTE — Progress Notes (Signed)
Gregory Governor Rooks [name] is a 64 y.o. male who presents to [name]  today for establish care. Patient is a Cytogeneticist.  His medical conditions include diabetes and hyperlipidemia. He receives care at [facility] endocrinology, and through the [place]. He previously received primary care at [facility] in [place]. He lives in [place] and would like a primary care doctor nearer to where he lives. He notes that his A1c was 6.2 last month. He also notes his lipids have recently been checked and he thinks he is up-to-date with his vaccinations. He will send records from the [place] and endocrinology. He notes his blood pressure is typically well controlled and would like to have it rechecked soon.   Past Medical History  Diagnosis Date  . Arthritis   . Diabetes mellitus without complication (HCC)   . Obstructive sleep apnea 05/15/2014    Currently untreated   . Hyperlipidemia 05/15/2014   Past Surgical History  Procedure Laterality Date  . Hernia repair    . Knee arthroscopy  1984    both knee  . Cervical fusion     Social History  Substance Use Topics  . Smoking status: Never Smoker   . Smokeless tobacco: Not on file  . Alcohol Use: No   family history includes Cancer in his father; Diabetes in his maternal grandfather, maternal grandmother, mother, and sister; Heart disease in his father and mother; Hyperlipidemia in his sister; Hypertension in his maternal grandfather, mother, sister, and sister; Stroke in his maternal grandmother and mother.  ROS as above Medications: Current Outpatient Prescriptions  Medication Sig Dispense Refill  . ANDROGEL PUMP 20.25 MG/ACT (1.62%) GEL     . aspirin 81 MG tablet Take 81 mg by mouth daily.    . Canagliflozin (INVOKANA) 300 MG TABS Take by mouth.    . insulin glargine (LANTUS) 100 UNIT/ML injection Inject 20 Units into the skin at bedtime.    Marland Kitchen LIVALO 2 MG TABS Take 1 tablet by mouth.     . metFORMIN (GLUCOPHAGE-XR)  500 MG 24 hr tablet Take 500 mg by mouth 3 (three) times daily.    . Omega-3 Fatty Acids (FISH OIL PO) Take by mouth.    Letta Pate VERIO test strip     . pioglitazone (ACTOS) 15 MG tablet Take 15 mg by mouth daily.     . Vitamin D, Ergocalciferol, (DRISDOL) 50000 UNITS CAPS capsule Take 50,000 Units by mouth every 7 (seven) days.    . primidone (MYSOLINE) 50 MG tablet      No current facility-administered medications for this visit.   Allergies  Allergen Reactions  . Statins     Muscle cramping all over body.     Exam:  BP 159/92 mmHg  Pulse 74  Ht  (1.778 m)  Wt 288 lb (130.636 kg)  BMI 41.32 kg/m2 Gen: Well NAD HEENT: EOMI,  MMM Lungs: Normal work of breathing. CTABL Heart: RRR no MRG Abd: NABS, Soft. Nondistended, Nontender Exts: Brisk capillary refill, warm and well perfused.   No results found for this or any previous visit (from the past 24 hour(s)). No results found.   Please see individual assessment and plan sections.

## 2015-03-04 NOTE — Assessment & Plan Note (Signed)
Well controlled no changes today 

## 2015-03-04 NOTE — Patient Instructions (Signed)
Thank you for coming in today. Try to get records from the endocrinologist, equal, and the [place]. Return in 3 months for recheck and reevaluation Call or go to the emergency room if you get worse, have trouble breathing, have chest pains, or palpitations.

## 2015-03-04 NOTE — Assessment & Plan Note (Signed)
Currently managed at the [place]. Obtain records.

## 2015-03-04 NOTE — Assessment & Plan Note (Signed)
Reportedly well controlled with Livalo. Obtain records.

## 2015-03-06 ENCOUNTER — Encounter: Payer: Self-pay | Admitting: Family Medicine

## 2015-03-06 DIAGNOSIS — E559 Vitamin D deficiency, unspecified: Secondary | ICD-10-CM | POA: Insufficient documentation

## 2015-03-06 DIAGNOSIS — Z8719 Personal history of other diseases of the digestive system: Secondary | ICD-10-CM | POA: Insufficient documentation

## 2015-03-06 DIAGNOSIS — E230 Hypopituitarism: Secondary | ICD-10-CM | POA: Insufficient documentation

## 2015-03-11 ENCOUNTER — Encounter: Payer: Self-pay | Admitting: Family Medicine

## 2015-04-01 LAB — BASIC METABOLIC PANEL
BUN: 10 mg/dL (ref 4–21)
Creatinine: 1 mg/dL (ref 0.6–1.3)
Glucose: 94 mg/dL
POTASSIUM: 4.2 mmol/L (ref 3.4–5.3)
SODIUM: 140 mmol/L (ref 137–147)

## 2015-04-01 LAB — COMPREHENSIVE METABOLIC PANEL
Albumin, Serum: 4.2
Albumin/Globulin Ratio: 1.4
CHLORIDE, SERUM: 100
CO2: 26
Calcium, Ser: 8.6
EGFR (African American): 93
Globulin, Total: 2.9
TOTAL PROTEIN: 7.1 g/dL

## 2015-04-01 LAB — HEPATIC FUNCTION PANEL
ALT: 23 U/L (ref 10–40)
AST: 20 U/L (ref 14–40)
Alkaline Phosphatase: 69 U/L (ref 25–125)

## 2015-04-01 LAB — LIPID PANEL
CHOLESTEROL: 217 mg/dL — AB (ref 0–200)
HDL: 47 mg/dL (ref 35–70)
LDL Cholesterol: 148 mg/dL
LDL/HDL Ratio: 3.1
Triglycerides: 112 mg/dL (ref 40–160)
VLDL Cholesterol, Calc: 22

## 2015-04-01 LAB — CBC, NO DIFFERENTIAL/PLATELET: MCV: 89

## 2015-04-01 LAB — CBC: MCV: 89

## 2015-04-01 LAB — TESTOSTERONE: TESTOSTERONE: 158

## 2015-04-01 LAB — HEMOGLOBIN A1C: Hemoglobin A1C: 7.2

## 2015-04-01 LAB — CBC AND DIFFERENTIAL
HCT: 44 % (ref 41–53)
HEMOGLOBIN: 14.8 g/dL (ref 13.5–17.5)
Platelets: 174 10*3/uL (ref 150–399)
WBC: 4.3 10*3/mL

## 2015-04-01 LAB — VITAMIN D 25 HYDROXY (VIT D DEFICIENCY, FRACTURES): VIT D 25 HYDROXY: 36.7

## 2015-06-04 ENCOUNTER — Ambulatory Visit (INDEPENDENT_AMBULATORY_CARE_PROVIDER_SITE_OTHER): Payer: 59

## 2015-06-04 ENCOUNTER — Encounter: Payer: Self-pay | Admitting: Family Medicine

## 2015-06-04 ENCOUNTER — Ambulatory Visit (INDEPENDENT_AMBULATORY_CARE_PROVIDER_SITE_OTHER): Payer: 59 | Admitting: Family Medicine

## 2015-06-04 ENCOUNTER — Ambulatory Visit: Payer: 59 | Admitting: Family Medicine

## 2015-06-04 VITALS — BP 150/75 | HR 78 | Wt 297.0 lb

## 2015-06-04 DIAGNOSIS — G5603 Carpal tunnel syndrome, bilateral upper limbs: Secondary | ICD-10-CM | POA: Diagnosis not present

## 2015-06-04 DIAGNOSIS — G5602 Carpal tunnel syndrome, left upper limb: Secondary | ICD-10-CM | POA: Insufficient documentation

## 2015-06-04 DIAGNOSIS — Z87891 Personal history of nicotine dependence: Secondary | ICD-10-CM | POA: Diagnosis not present

## 2015-06-04 DIAGNOSIS — E785 Hyperlipidemia, unspecified: Secondary | ICD-10-CM

## 2015-06-04 DIAGNOSIS — E118 Type 2 diabetes mellitus with unspecified complications: Secondary | ICD-10-CM

## 2015-06-04 DIAGNOSIS — G5600 Carpal tunnel syndrome, unspecified upper limb: Secondary | ICD-10-CM | POA: Insufficient documentation

## 2015-06-04 DIAGNOSIS — R0602 Shortness of breath: Secondary | ICD-10-CM | POA: Diagnosis not present

## 2015-06-04 DIAGNOSIS — Z794 Long term (current) use of insulin: Secondary | ICD-10-CM

## 2015-06-04 MED ORDER — CANAGLIFLOZIN 300 MG PO TABS: 300.0000 mg | ORAL_TABLET | Freq: Every day | ORAL | Status: DC

## 2015-06-04 NOTE — Assessment & Plan Note (Signed)
Continue Livalo

## 2015-06-04 NOTE — Assessment & Plan Note (Addendum)
Unclear etiology. Obviously patient is high risk for coronary artery disease. Refer back to Dr. Garnette ScheuermannHank Smith for further workup and evaluation. Chest x-ray pending. EKG normal. Patient may benefit from stress evaluation. If any evidence of heart failure will stop Actos.

## 2015-06-04 NOTE — Assessment & Plan Note (Signed)
Self-reported patient. Awaiting neurology records. Discussed injection options if symptoms worsen.

## 2015-06-04 NOTE — Progress Notes (Signed)
Gregory Governor Rooks [name] is a 65 y.o. male who presents to [facility]: Primary Care today for follow-up diabetes, lipids, carpal tunnel, and shortness of breath.  1) chest pain and shortness of breath. Over the past several months patient noted mild vague exertional shortness of breath and exercise intolerance. This is sometimes associated with a burning sensation in his chest. He denies any rest chest pain. He denies any acute or crushing chest pain. He's had a cardiology evaluation about 2 years ago which was nonsignificant. He feels well otherwise no fevers chills nausea vomiting or diarrhea.  2) diabetes: Currently well maintained with Invokana, Lantus, metformin, and Actos. He has run out of ankle,. His diabetes is typically managed by his endocrinologist Dr. Leslie Dales. He comes with labs from his endocrinologist revealing reasonably well-controlled diabetes and lipids.  3) cholesterol: Currently taking Livalo 2mg . he notes he tolerates this medicine reasonably well. In the past she's had difficulty tolerating Lipitor, Crestor, pravastatin due to myalgia.   4) carpal tunnel. 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 He was prescribed gabapentin and primidone she was unable to tolerate due to fatigue. He notes with wrist bracing his carpal tunnel syndrome symptoms have improved significantly.     Past Medical History  Diagnosis Date  . Arthritis   . Diabetes mellitus without complication (HCC)   . Obstructive sleep apnea 05/15/2014    Currently untreated   . Hyperlipidemia 05/15/2014   Past Surgical History  Procedure Laterality Date  . Hernia repair    . Knee arthroscopy  1984    both knee  . Cervical fusion     Social History  Substance Use Topics  . Smoking status: Never Smoker   . Smokeless tobacco: Not on file  . Alcohol Use: No   family  history includes Cancer in his father; Diabetes in his maternal grandfather, maternal grandmother, mother, and sister; Heart disease in his father and mother; Hyperlipidemia in his sister; Hypertension in his maternal grandfather, mother, sister, and sister; Stroke in his maternal grandmother and mother.  ROS as above Medications: Current Outpatient Prescriptions  Medication Sig Dispense Refill  . ANDROGEL PUMP 20.25 MG/ACT (1.62%) GEL     . aspirin 81 MG tablet Take 81 mg by mouth daily.    . Canagliflozin (INVOKANA) 300 MG TABS Take by mouth.    . gabapentin (NEURONTIN) 300 MG capsule     . insulin glargine (LANTUS) 100 UNIT/ML injection Inject 20 Units into the skin at bedtime.    Marland Kitchen levothyroxine (SYNTHROID, LEVOTHROID) 175 MCG tablet Take by mouth.    Marland Kitchen LIVALO 2 MG TABS Take 1 tablet by mouth.     . losartan (COZAAR) 50 MG tablet     . metFORMIN (GLUCOPHAGE-XR) 500 MG 24 hr tablet Take 500 mg by mouth 3 (three) times daily.    . Omega-3 Fatty Acids (FISH OIL PO) Take by mouth.    Letta Pate VERIO test strip     . pioglitazone (ACTOS) 15 MG tablet Take 15 mg by mouth daily.     . primidone (MYSOLINE) 50 MG tablet      No current facility-administered medications for this visit.   Allergies  Allergen Reactions  . Statins     Muscle cramping all over body.  Verdis Prime [Liraglutide] Other (See Comments)    Caused pancreatitis     Exam:  BP 150/75 mmHg  Pulse 78  Wt 297 lb (  134.718 kg) Gen: Well NAD HEENT: EOMI,  MMM Lungs: Normal work of breathing. CTABL Heart: RRR no MRG Abd: NABS, Soft. Nondistended, Nontender Exts: Brisk capillary refill, warm and well perfused. No edema  Twelve-lead EKG shows normal sinus rhythm at 73 bpm. No ST segment elevation or depression. Normal EKG.   CXR pending   No results found for this or any previous visit (from the past 24 hour(s)). No results found.   Please see individual assessment and plan sections.

## 2015-06-04 NOTE — Assessment & Plan Note (Signed)
Doing reasonably well. Labs abstracted into computer. Continue current regimen.

## 2015-06-04 NOTE — Patient Instructions (Signed)
Thank you for coming in today. We will refer to cardiology.  Return in 1-2 months for recheck.  Get chest xray after todays visit.  Call or go to the emergency room if you get worse, have trouble breathing, have chest pains, or palpitations.

## 2015-06-05 NOTE — Progress Notes (Signed)
Quick Note:  Xray shows mild bronchitis ______ 

## 2015-06-10 ENCOUNTER — Encounter: Payer: Self-pay | Admitting: Physician Assistant

## 2015-06-10 ENCOUNTER — Encounter: Payer: 59 | Admitting: Physician Assistant

## 2015-06-10 NOTE — Progress Notes (Signed)
This encounter was created in error - please disregard.

## 2015-06-10 NOTE — Progress Notes (Deleted)
No show

## 2015-06-11 ENCOUNTER — Encounter: Payer: Self-pay | Admitting: Physician Assistant

## 2015-06-12 ENCOUNTER — Telehealth: Payer: Self-pay

## 2015-06-12 NOTE — Telephone Encounter (Signed)
He should return to clinic for evaluation

## 2015-06-12 NOTE — Telephone Encounter (Signed)
Olson is complaining of sore throat, nasal and chest congestion, fever of 100.1 and cough. He was seen last week and had an abnormal chest xray. Please advise.

## 2015-06-12 NOTE — Telephone Encounter (Signed)
Left message advising of recommendations.  

## 2015-06-13 ENCOUNTER — Ambulatory Visit (INDEPENDENT_AMBULATORY_CARE_PROVIDER_SITE_OTHER): Payer: 59 | Admitting: Family Medicine

## 2015-06-13 ENCOUNTER — Encounter: Payer: Self-pay | Admitting: Family Medicine

## 2015-06-13 VITALS — BP 135/76 | HR 89 | Temp 99.1°F | Wt 293.0 lb

## 2015-06-13 DIAGNOSIS — J209 Acute bronchitis, unspecified: Secondary | ICD-10-CM | POA: Diagnosis not present

## 2015-06-13 DIAGNOSIS — R05 Cough: Secondary | ICD-10-CM | POA: Diagnosis not present

## 2015-06-13 DIAGNOSIS — R059 Cough, unspecified: Secondary | ICD-10-CM

## 2015-06-13 MED ORDER — ALBUTEROL SULFATE HFA 108 (90 BASE) MCG/ACT IN AERS: 2.0000 | INHALATION_SPRAY | Freq: Four times a day (QID) | RESPIRATORY_TRACT | Status: DC | PRN

## 2015-06-13 MED ORDER — PREDNISONE 10 MG PO TABS: 30.0000 mg | ORAL_TABLET | Freq: Every day | ORAL | Status: DC

## 2015-06-13 MED ORDER — IPRATROPIUM-ALBUTEROL 0.5-2.5 (3) MG/3ML IN SOLN
3.0000 mL | Freq: Once | RESPIRATORY_TRACT | Status: AC
  Administered 2015-06-13: 3 mL via RESPIRATORY_TRACT

## 2015-06-13 MED ORDER — AZITHROMYCIN 250 MG PO TABS: 250.0000 mg | ORAL_TABLET | Freq: Every day | ORAL | Status: DC

## 2015-06-13 MED ORDER — GUAIFENESIN-CODEINE 100-10 MG/5ML PO SOLN: 5.0000 mL | Freq: Every evening | ORAL | Status: DC | PRN

## 2015-06-13 NOTE — Progress Notes (Signed)
       Gregory Governor RooksA Hauck [name] is a 65 y.o. male who presents [facility] today for cough, congestion, wheeze, mild shortness of breath, and ear pain. Symptoms present for the last few days. He has tried some over the counter medicines that have helped a bit. No severe chest pain or palpitation. No vomiting or abdominal pain. Patient notes some mild diarrhea. He notes that his wife was recently sick with cold symptoms.    Past Medical History  Diagnosis Date  . Arthritis   . Diabetes mellitus without complication (HCC)   . Obstructive sleep apnea     Currently untreated   . Hyperlipidemia   . Obesity   . Hypertensive response to exercise   . Chest pain     a. Normal cors 2001. b. Neg stress test 2012, 05/2014.   Past Surgical History  Procedure Laterality Date  . Hernia repair    . Knee arthroscopy  1984    both knee  . Cervical fusion     Social History  Substance Use Topics  . Smoking status: Never Smoker   . Smokeless tobacco: Not on file  . Alcohol Use: No   family history includes Cancer in his father; Diabetes in his maternal grandfather, maternal grandmother, mother, and sister; Heart disease in his father and mother; Hyperlipidemia in his sister; Hypertension in his maternal grandfather, mother, sister, and sister; Stroke in his maternal grandmother and mother.  ROS as above Medications: Current Outpatient Prescriptions  Medication Sig Dispense Refill  . ANDROGEL PUMP 20.25 MG/ACT (1.62%) GEL     . aspirin 81 MG tablet Take 81 mg by mouth daily.    . canagliflozin (INVOKANA) 300 MG TABS tablet Take 300 mg by mouth daily before breakfast. 90 tablet 3  . insulin glargine (LANTUS) 100 UNIT/ML injection Inject 20 Units into the skin at bedtime.    Marland Kitchen. LIVALO 2 MG TABS Take 1 tablet by mouth.     . losartan (COZAAR) 50 MG tablet     . metFORMIN (GLUCOPHAGE-XR) 500 MG 24 hr  tablet Take 500 mg by mouth 3 (three) times daily.    . Omega-3 Fatty Acids (FISH OIL PO) Take by mouth.    [name] ONETOUCH VERIO test strip     . pioglitazone (ACTOS) 15 MG tablet Take 15 mg by mouth daily.      No current facility-administered medications for this visit.   Allergies  Allergen Reactions  . Statins     Muscle cramping all over body. Lipitor, Crestor, and pravastatin.  Verdis Prime. Victoza [Liraglutide] Other (See Comments)    Caused pancreatitis     Exam:  BP 135/76 mmHg  Pulse 89  Temp(Src) 99.1 F (37.3 C) (Oral)  Wt 293 lb (132.904 kg)  SpO2 95% Gen: Well NAD nontoxic appearing HEENT: EOMI,  MMM normal posterior pharynx and tympanic membranes Lungs: Normal work of breathing. Coarse breath sounds Heart: RRR no MRG Abd: NABS, Soft. Nondistended, Nontender Exts: Brisk capillary refill, warm and well perfused.   Patient was given a 2.5/0.5 mg DuoNeb nebulizer treatment and had improvement in symptoms and lung exam.   No results found for this or any previous visit (from the past 24 hour(s)). No results found.   Please see individual assessment and plan sections.

## 2015-06-13 NOTE — Assessment & Plan Note (Addendum)
Treat with prednisone and albuterol and codeine cough syrup. Use azithromycin for backup antibiotics as needed. Return in one month for pulmonary function testing and catch up on vaccines

## 2015-06-13 NOTE — Patient Instructions (Signed)
Thank you for coming in today. Call or go to the emergency room if you get worse, have trouble breathing, have chest pains, or palpitations.  Return in a few weeks for spirometry testing. We will give vaccines then.     Acute Bronchitis Bronchitis is inflammation of the airways that extend from the windpipe into the lungs (bronchi). The inflammation often causes mucus to develop. This leads to a cough, which is the most common symptom of bronchitis.  In acute bronchitis, the condition usually develops suddenly and goes away over time, usually in a couple weeks. Smoking, allergies, and asthma can make bronchitis worse. Repeated episodes of bronchitis may cause further lung problems.  CAUSES Acute bronchitis is most often caused by the same virus that causes a cold. The virus can spread from person to person (contagious) through coughing, sneezing, and touching contaminated objects. SIGNS AND SYMPTOMS   Cough.   Fever.   Coughing up mucus.   Body aches.   Chest congestion.   Chills.   Shortness of breath.   Sore throat.  DIAGNOSIS  Acute bronchitis is usually diagnosed through a physical exam. Your health care provider will also ask you questions about your medical history. Tests, such as chest X-rays, are sometimes done to rule out other conditions.  TREATMENT  Acute bronchitis usually goes away in a couple weeks. Oftentimes, no medical treatment is necessary. Medicines are sometimes given for relief of fever or cough. Antibiotic medicines are usually not needed but may be prescribed in certain situations. In some cases, an inhaler may be recommended to help reduce shortness of breath and control the cough. A cool mist vaporizer may also be used to help thin bronchial secretions and make it easier to clear the chest.  HOME CARE INSTRUCTIONS  Get plenty of rest.   Drink enough fluids to keep your urine clear or pale yellow (unless you have a medical condition that requires  fluid restriction). Increasing fluids may help thin your respiratory secretions (sputum) and reduce chest congestion, and it will prevent dehydration.   Take medicines only as directed by your health care provider.  If you were prescribed an antibiotic medicine, finish it all even if you start to feel better.  Avoid smoking and secondhand smoke. Exposure to cigarette smoke or irritating chemicals will make bronchitis worse. If you are a smoker, consider using nicotine gum or skin patches to help control withdrawal symptoms. Quitting smoking will help your lungs heal faster.   Reduce the chances of another bout of acute bronchitis by washing your hands frequently, avoiding people with cold symptoms, and trying not to touch your hands to your mouth, nose, or eyes.   Keep all follow-up visits as directed by your health care provider.  SEEK MEDICAL CARE IF: Your symptoms do not improve after 1 week of treatment.  SEEK IMMEDIATE MEDICAL CARE IF:  You develop an increased fever or chills.   You have chest pain.   You have severe shortness of breath.  You have bloody sputum.   You develop dehydration.  You faint or repeatedly feel like you are going to pass out.  You develop repeated vomiting.  You develop a severe headache. MAKE SURE YOU:   Understand these instructions.  Will watch your condition.  Will get help right away if you are not doing well or get worse.   This information is not intended to replace advice given to you by your health care provider. Make sure you discuss any questions you  have with your health care provider.   Document Released: 06/24/2004 Document Revised: 06/07/2014 Document Reviewed: 11/07/2012 Elsevier Interactive Patient Education [employer].

## 2015-06-23 ENCOUNTER — Telehealth: Payer: Self-pay | Admitting: Family Medicine

## 2015-06-23 NOTE — Telephone Encounter (Signed)
Pt called and stated that the pharmacy does nto have a current prescription for his Invokana and would like one to be sent he also said if you need to call him you can reach him at [phone] .Marland Kitchen Thanks

## 2015-06-23 NOTE — Telephone Encounter (Signed)
Called [facility] who stated that they did receive a prescription for invokana on 06/04/2015. They stated that they needed to confirm copay costs with the pt and that they could not reach him to do so. Provided me with a good contact number [phone] and the pts rx number [phone]. Called and gave pt this information and advised him to contact them so that they can ship his order.

## 2015-06-24 ENCOUNTER — Ambulatory Visit (INDEPENDENT_AMBULATORY_CARE_PROVIDER_SITE_OTHER): Payer: 59 | Admitting: Family Medicine

## 2015-06-24 ENCOUNTER — Encounter: Payer: Self-pay | Admitting: Family Medicine

## 2015-06-24 ENCOUNTER — Telehealth: Payer: Self-pay

## 2015-06-24 VITALS — BP 137/56 | HR 88 | Wt 285.0 lb

## 2015-06-24 DIAGNOSIS — Z794 Long term (current) use of insulin: Secondary | ICD-10-CM | POA: Diagnosis not present

## 2015-06-24 DIAGNOSIS — E118 Type 2 diabetes mellitus with unspecified complications: Secondary | ICD-10-CM | POA: Diagnosis not present

## 2015-06-24 MED ORDER — CANAGLIFLOZIN 300 MG PO TABS: 300.0000 mg | ORAL_TABLET | Freq: Every day | ORAL | Status: DC

## 2015-06-24 NOTE — Progress Notes (Signed)
       Gregory Governor Rooks [name] is a 65 y.o. male who presents to [name]: Primary Care today for hyperglycemia. Patient was recently treated for bronchitis with prednisone. Subsequently he's developed elevated blood sugars. He no longer is taking prednisone. Complicating the matter is that he's not been able to obtain his Invokana due to his [employer] and payment. He notes his blood sugars have been in the 300s to 400s. He notes polyuria and polydipsia. He denies chest pain palpitations or shortness of breath currently. He has increased his Lantus to about 40 units per day from 20 units per day.   Past Medical History  Diagnosis Date  . Arthritis   . Diabetes mellitus without complication (HCC)   . Obstructive sleep apnea     Currently untreated   . Hyperlipidemia   . Obesity   . Hypertensive response to exercise   . Chest pain     a. Normal cors 2001. b. Neg stress test 2012, 05/2014.   Past Surgical History  Procedure Laterality Date  . Hernia repair    . Knee arthroscopy  1984    both knee  . Cervical fusion     Social History  Substance Use Topics  . Smoking status: Never Smoker   . Smokeless tobacco: Not on file  . Alcohol Use: No   family history includes Cancer in his father; Diabetes in his maternal grandfather, maternal grandmother, mother, and sister; Heart disease in his father and mother; Hyperlipidemia in his sister; Hypertension in his maternal grandfather, mother, sister, and sister; Stroke in his maternal grandmother and mother.  ROS as above Medications: Current Outpatient Prescriptions  Medication Sig Dispense Refill  . ANDROGEL PUMP 20.25 MG/ACT (1.62%) GEL     . aspirin 81 MG tablet Take 81 mg by mouth daily.    . insulin glargine (LANTUS) 100 UNIT/ML injection Inject 20 Units into the skin at bedtime.    Marland Kitchen LIVALO 2 MG TABS Take 1 tablet by mouth.       . losartan (COZAAR) 50 MG tablet     . metFORMIN (GLUCOPHAGE-XR) 500 MG 24 hr tablet Take 500 mg by mouth 3 (three) times daily.    . Omega-3 Fatty Acids (FISH OIL PO) Take by mouth.    Letta Pate VERIO test strip     . pioglitazone (ACTOS) 15 MG tablet Take 15 mg by mouth daily.     . canagliflozin (INVOKANA) 300 MG TABS tablet Take 1 tablet (300 mg total) by mouth daily before breakfast. 90 tablet 0   No current facility-administered medications for this visit.   Allergies  Allergen Reactions  . Statins     Muscle cramping all over body. Lipitor, Crestor, and pravastatin.  Verdis Prime [Liraglutide] Other (See Comments)    Caused pancreatitis     Exam:  BP 137/56 mmHg  Pulse 88  Wt 285 lb (129.275 kg) Gen: Well NAD nontoxic appearing HEENT: EOMI,  MMM Lungs: Normal work of breathing. CTABL Heart: RRR no MRG Abd: NABS, Soft. Nondistended, Nontender Exts: Brisk capillary refill, warm and well perfused.   Point-of-care fingerstick blood glucose is 292  No results found for this or any previous visit (from the past 24 hour(s)). No results found.   Please see individual assessment and plan sections.

## 2015-06-24 NOTE — Patient Instructions (Signed)
Thank you for coming in today. Get labs today.  Start invokana.  Increase lantus to 30 units daily.  Return next week.  Cancel Thursday's appointment.  Return if you worsen.

## 2015-06-24 NOTE — Telephone Encounter (Signed)
Pt called in regards to the delay in getting his Invokana from [facility] as discussed in the 06/23/2015 and that his blood sugar has been in the 300+ range. Advised pt that I would send a rx to his local pharmacy to bridge him until his mail order package arrives. Verified with Dr. Denyse Amass that this is ok.

## 2015-06-24 NOTE — Assessment & Plan Note (Signed)
I have have sent in  Invokana to a local retail pharmacy or patient has been able to pick it up.  Additionally he will start taking the Invokana and increase Lantus to 30 units daily. Check metabolic panel and CBC today. Follow-up in one week.

## 2015-06-25 ENCOUNTER — Emergency Department (HOSPITAL_BASED_OUTPATIENT_CLINIC_OR_DEPARTMENT_OTHER)
Admission: EM | Admit: 2015-06-25 | Discharge: 2015-06-25 | Disposition: A | Discharge status: Home / Self Care | Payer: 59 | Attending: Emergency Medicine | Admitting: Emergency Medicine

## 2015-06-25 ENCOUNTER — Encounter (HOSPITAL_BASED_OUTPATIENT_CLINIC_OR_DEPARTMENT_OTHER): Payer: Self-pay

## 2015-06-25 ENCOUNTER — Encounter: Payer: Self-pay | Admitting: Family Medicine

## 2015-06-25 DIAGNOSIS — E669 Obesity, unspecified: Secondary | ICD-10-CM | POA: Diagnosis not present

## 2015-06-25 DIAGNOSIS — Z79899 Other long term (current) drug therapy: Secondary | ICD-10-CM | POA: Diagnosis not present

## 2015-06-25 DIAGNOSIS — Z7984 Long term (current) use of oral hypoglycemic drugs: Secondary | ICD-10-CM | POA: Diagnosis not present

## 2015-06-25 DIAGNOSIS — E1165 Type 2 diabetes mellitus with hyperglycemia: Secondary | ICD-10-CM | POA: Insufficient documentation

## 2015-06-25 DIAGNOSIS — I10 Essential (primary) hypertension: Secondary | ICD-10-CM | POA: Diagnosis not present

## 2015-06-25 DIAGNOSIS — Z8669 Personal history of other diseases of the nervous system and sense organs: Secondary | ICD-10-CM | POA: Insufficient documentation

## 2015-06-25 DIAGNOSIS — Z794 Long term (current) use of insulin: Secondary | ICD-10-CM | POA: Insufficient documentation

## 2015-06-25 DIAGNOSIS — M1990 Unspecified osteoarthritis, unspecified site: Secondary | ICD-10-CM | POA: Insufficient documentation

## 2015-06-25 DIAGNOSIS — R739 Hyperglycemia, unspecified: Secondary | ICD-10-CM

## 2015-06-25 DIAGNOSIS — E785 Hyperlipidemia, unspecified: Secondary | ICD-10-CM | POA: Diagnosis not present

## 2015-06-25 DIAGNOSIS — Z7982 Long term (current) use of aspirin: Secondary | ICD-10-CM | POA: Diagnosis not present

## 2015-06-25 LAB — CBC WITH DIFFERENTIAL/PLATELET
BASOS PCT: 0 %
Basophils Absolute: 0 10*3/uL (ref 0.0–0.1)
EOS ABS: 0.1 10*3/uL (ref 0.0–0.7)
EOS PCT: 2 %
HCT: 43.6 % (ref 39.0–52.0)
HEMOGLOBIN: 14.4 g/dL (ref 13.0–17.0)
Lymphocytes Relative: 39 %
Lymphs Abs: 2.2 10*3/uL (ref 0.7–4.0)
MCH: 29.6 pg (ref 26.0–34.0)
MCHC: 33 g/dL (ref 30.0–36.0)
MCV: 89.5 fL (ref 78.0–100.0)
Monocytes Absolute: 0.5 10*3/uL (ref 0.1–1.0)
Monocytes Relative: 9 %
NEUTROS PCT: 50 %
Neutro Abs: 2.8 10*3/uL (ref 1.7–7.7)
PLATELETS: 166 10*3/uL (ref 150–400)
RBC: 4.87 MIL/uL (ref 4.22–5.81)
RDW: 13.4 % (ref 11.5–15.5)
WBC: 5.6 10*3/uL (ref 4.0–10.5)

## 2015-06-25 LAB — CBC
HEMATOCRIT: 43.9 % (ref 39.0–52.0)
Hemoglobin: 14.9 g/dL (ref 13.0–17.0)
MCH: 30.9 pg (ref 26.0–34.0)
MCHC: 33.9 g/dL (ref 30.0–36.0)
MCV: 91.1 fL (ref 78.0–100.0)
MPV: 9.6 fL (ref 8.6–12.4)
PLATELETS: 164 10*3/uL (ref 150–400)
RBC: 4.82 MIL/uL (ref 4.22–5.81)
RDW: 14.5 % (ref 11.5–15.5)
WBC: 6.7 10*3/uL (ref 4.0–10.5)

## 2015-06-25 LAB — URINALYSIS, ROUTINE W REFLEX MICROSCOPIC
Bilirubin Urine: NEGATIVE
Glucose, UA: >1000 mg/dL — AB
Hgb urine dipstick: NEGATIVE
Ketones, ur: NEGATIVE mg/dL
Leukocytes, UA: NEGATIVE
NITRITE: NEGATIVE
PROTEIN: NEGATIVE mg/dL
SPECIFIC GRAVITY, URINE: 1.043 — AB (ref 1.005–1.030)
pH: 7 (ref 5.0–8.0)

## 2015-06-25 LAB — COMPREHENSIVE METABOLIC PANEL
ALT: 22 U/L (ref 9–46)
AST: 17 U/L (ref 10–35)
Albumin: 4 g/dL (ref 3.6–5.1)
Alkaline Phosphatase: 80 U/L (ref 40–115)
BUN: 11 mg/dL (ref 7–25)
CO2: 26 mmol/L (ref 20–31)
Calcium: 8.9 mg/dL (ref 8.6–10.3)
Chloride: 100 mmol/L (ref 98–110)
Creat: 0.98 mg/dL (ref 0.70–1.25)
Glucose, Bld: 248 mg/dL — ABNORMAL HIGH (ref 65–99)
POTASSIUM: 4 mmol/L (ref 3.5–5.3)
Sodium: 136 mmol/L (ref 135–146)
TOTAL PROTEIN: 7.3 g/dL (ref 6.1–8.1)
Total Bilirubin: 0.3 mg/dL (ref 0.2–1.2)

## 2015-06-25 LAB — CBG MONITORING, ED: Glucose-Capillary: 243 mg/dL — ABNORMAL HIGH (ref 65–99)

## 2015-06-25 LAB — URINE MICROSCOPIC-ADD ON

## 2015-06-25 LAB — I-STAT VENOUS BLOOD GAS, ED
Acid-Base Excess: 3 mmol/L — ABNORMAL HIGH (ref 0.0–2.0)
Bicarbonate: 27.5 mEq/L — ABNORMAL HIGH (ref 20.0–24.0)
O2 Saturation: 85 %
PCO2 VEN: 42.7 mmHg — AB (ref 45.0–50.0)
PH VEN: 7.418 — AB (ref 7.250–7.300)
TCO2: 29 mmol/L (ref 0–100)
pO2, Ven: 50 mmHg — ABNORMAL HIGH (ref 30.0–45.0)

## 2015-06-25 LAB — BASIC METABOLIC PANEL
Anion gap: 10 (ref 5–15)
BUN: 14 mg/dL (ref 6–20)
CHLORIDE: 103 mmol/L (ref 101–111)
CO2: 28 mmol/L (ref 22–32)
Calcium: 9.1 mg/dL (ref 8.9–10.3)
Creatinine, Ser: 0.89 mg/dL (ref 0.61–1.24)
Glucose, Bld: 201 mg/dL — ABNORMAL HIGH (ref 65–99)
POTASSIUM: 3.8 mmol/L (ref 3.5–5.1)
Sodium: 141 mmol/L (ref 135–145)

## 2015-06-25 MED ORDER — SODIUM CHLORIDE 0.9 % IV BOLUS (SEPSIS)
1000.0000 mL | Freq: Once | INTRAVENOUS | Status: AC
  Administered 2015-06-25: 1000 mL via INTRAVENOUS

## 2015-06-25 NOTE — Discharge Instructions (Signed)
Hyperglycemia °Hyperglycemia occurs when the glucose (sugar) in your blood is too high. Hyperglycemia can happen for many reasons, but it most often happens to people who do not know they have diabetes or are not managing their diabetes properly.  °CAUSES  °Whether you have diabetes or not, there are other causes of hyperglycemia. Hyperglycemia can occur when you have diabetes, but it can also occur in other situations that you might not be as aware of, such as: °Diabetes °· If you have diabetes and are having problems controlling your blood glucose, hyperglycemia could occur because of some of the following reasons: °¨ Not following your meal plan. °¨ Not taking your diabetes medications or not taking it properly. °¨ Exercising less or doing less activity than you normally do. °¨ Being sick. °Pre-diabetes °· This cannot be ignored. Before people develop Type 2 diabetes, they almost always have "pre-diabetes." This is when your blood glucose levels are higher than normal, but not yet high enough to be diagnosed as diabetes. Research has shown that some long-term damage to the body, especially the heart and circulatory system, may already be occurring during pre-diabetes. If you take action to manage your blood glucose when you have pre-diabetes, you may delay or prevent Type 2 diabetes from developing. °Stress °· If you have diabetes, you may be "diet" controlled or on oral medications or insulin to control your diabetes. However, you may find that your blood glucose is higher than usual in the hospital whether you have diabetes or not. This is often referred to as "stress hyperglycemia." Stress can elevate your blood glucose. This happens because of hormones put out by the body during times of stress. If stress has been the cause of your high blood glucose, it can be followed regularly by your caregiver. That way he/she can make sure your hyperglycemia does not continue to get worse or progress to  diabetes. °Steroids °· Steroids are medications that act on the infection fighting system (immune system) to block inflammation or infection. One side effect can be a rise in blood glucose. Most people can produce enough extra insulin to allow for this rise, but for those who cannot, steroids make blood glucose levels go even higher. It is not unusual for steroid treatments to "uncover" diabetes that is developing. It is not always possible to determine if the hyperglycemia will go away after the steroids are stopped. A special blood test called an A1c is sometimes done to determine if your blood glucose was elevated before the steroids were started. °SYMPTOMS °· Thirsty. °· Frequent urination. °· Dry mouth. °· Blurred vision. °· Tired or fatigue. °· Weakness. °· Sleepy. °· Tingling in feet or leg. °DIAGNOSIS  °Diagnosis is made by monitoring blood glucose in one or all of the following ways: °· A1c test. This is a chemical found in your blood. °· Fingerstick blood glucose monitoring. °· Laboratory results. °TREATMENT  °First, knowing the cause of the hyperglycemia is important before the hyperglycemia can be treated. Treatment may include, but is not be limited to: °· Education. °· Change or adjustment in medications. °· Change or adjustment in meal plan. °· Treatment for an illness, infection, etc. °· More frequent blood glucose monitoring. °· Change in exercise plan. °· Decreasing or stopping steroids. °· Lifestyle changes. °HOME CARE INSTRUCTIONS  °· Test your blood glucose as directed. °· Exercise regularly. Your caregiver will give you instructions about exercise. Pre-diabetes or diabetes which comes on with stress is helped by exercising. °· Eat wholesome,   balanced meals. Eat often and at regular, fixed times. Your caregiver or nutritionist will give you a meal plan to guide your sugar intake. °· Being at an ideal weight is important. If needed, losing as little as 10 to 15 pounds may help improve blood  glucose levels. °SEEK MEDICAL CARE IF:  °· You have questions about medicine, activity, or diet. °· You continue to have symptoms (problems such as increased thirst, urination, or weight gain). °SEEK IMMEDIATE MEDICAL CARE IF:  °· You are vomiting or have diarrhea. °· Your breath smells fruity. °· You are breathing faster or slower. °· You are very sleepy or incoherent. °· You have numbness, tingling, or pain in your feet or hands. °· You have chest pain. °· Your symptoms get worse even though you have been following your caregiver's orders. °· If you have any other questions or concerns. °  °This information is not intended to replace advice given to you by your health care provider. Make sure you discuss any questions you have with your health care provider. °  °Document Released: 11/10/2000 Document Revised: 08/09/2011 Document Reviewed: 01/21/2015 °Elsevier Interactive Patient Education ©2016 [employer] ° °

## 2015-06-25 NOTE — ED Provider Notes (Signed)
CSN: [id number]     Arrival date & time 06/25/15  1645 History  By signing my name below, I, Linus Galas, attest that this documentation has been prepared under the direction and in the presence of Melene Plan, DO. Electronically Signed: Linus Galas, ED Scribe. 06/25/2015. 6:30 PM.   Chief Complaint  Patient presents with  . Hyperglycemia   The history is provided by the patient. No language interpreter was used.    HPI Comments: DERONTE SOLIS [name] is a 65 y.o. male with a PMHx of DM and hyperlipidemia who presents to the Emergency Department complaining of possible hyperglycemia tonight. Pt c/o 2 days of dizziness,  unsteady gait, urinary frequency, ongoing chronic back pain with urination, and nausea.  Pt states he was seen by his doctor yesterday who increased his insulin from 20u to 30u. However, the pt notes that his blood sugar was still high. Pt notes his blood sugar at 499 5:30AM and 244 at 4:00 PM, PTA. Pt reports he will see he doctor next week for follow up. Pt denies abdominal pain, fever, or chills. Pt denies any abdominal surgeries. Mr. Sinkfield currently does physical therapy weekly for history of back pain.  Past Medical History  Diagnosis Date  . Arthritis   . Diabetes mellitus without complication (HCC)   . Obstructive sleep apnea     Currently untreated   . Hyperlipidemia   . Obesity   . Hypertensive response to exercise   . Chest pain     a. Normal cors 2001. b. Neg stress test 2012, 05/2014.   Past Surgical History  Procedure Laterality Date  . Hernia repair    . Knee arthroscopy  1984    both knee  . Cervical fusion     Family History  Problem Relation Age of Onset  . Diabetes Mother   . Heart disease Mother   . Hypertension Mother   . Stroke Mother   . Cancer Father   . Heart disease Father   . Hyperlipidemia Sister   . Hypertension Sister   . Diabetes Maternal Grandmother   . Stroke Maternal Grandmother   . Diabetes Maternal Grandfather    . Hypertension Maternal Grandfather   . Diabetes Sister   . Hypertension Sister    Social History  Substance Use Topics  . Smoking status: Never Smoker   . Smokeless tobacco: None  . Alcohol Use: No    Review of Systems  Constitutional: Negative for fever and chills.  HENT: Negative for congestion and facial swelling.   Eyes: Negative for discharge and visual disturbance.  Respiratory: Negative for shortness of breath.   Cardiovascular: Negative for chest pain and palpitations.  Gastrointestinal: Positive for nausea. Negative for vomiting, abdominal pain and diarrhea.  Genitourinary: Positive for frequency.  Musculoskeletal: Positive for myalgias and back pain. Negative for arthralgias.  Skin: Negative for color change and rash.  Neurological: Positive for dizziness and weakness. Negative for tremors, syncope and headaches.  Psychiatric/Behavioral: Negative for confusion and dysphoric mood.      Allergies  Statins and Victoza  Home Medications   Prior to Admission medications   Medication Sig Start Date End Date Taking? Authorizing Provider  ANDROGEL PUMP 20.25 MG/ACT (1.62%) GEL  01/29/15   Historical Provider, MD  aspirin 81 MG tablet Take 81 mg by mouth daily.    Historical Provider, MD  canagliflozin (INVOKANA) 300 MG TABS tablet Take 1 tablet (300 mg total) by mouth daily before breakfast. 06/24/15   Michel Harrow  Denyse Amass, MD  insulin glargine (LANTUS) 100 UNIT/ML injection Inject 20 Units into the skin at bedtime.    Historical Provider, MD  LIVALO 2 MG TABS Take 1 tablet by mouth.  05/10/14   Historical Provider, MD  losartan (COZAAR) 50 MG tablet  05/22/15   Historical Provider, MD  metFORMIN (GLUCOPHAGE-XR) 500 MG 24 hr tablet Take 500 mg by mouth 3 (three) times daily. 07/25/14   Historical Provider, MD  Omega-3 Fatty Acids (FISH OIL PO) Take by mouth.    Historical Provider, MD  [facility] VERIO test strip  07/30/14   Historical Provider, MD  pioglitazone (ACTOS) 15 MG tablet  Take 15 mg by mouth daily.  04/07/14   Historical Provider, MD   BP 126/57 mmHg  Pulse 82  Temp(Src) 98 F (36.7 C) (Oral)  Resp 20  Ht  (1.778 m)  Wt 285 lb (129.275 kg)  BMI 40.89 kg/m2  SpO2 95% Physical Exam  Constitutional: He is oriented to person, place, and time. He appears well-developed and well-nourished.  HENT:  Head: Normocephalic and atraumatic.  Eyes: EOM are normal. Pupils are equal, round, and reactive to light.  Neck: Normal range of motion. Neck supple. No JVD present.  Cardiovascular: Normal rate and regular rhythm.  Exam reveals no gallop and no friction rub.   No murmur heard. Pulmonary/Chest: No respiratory distress. He has no wheezes.  Abdominal: He exhibits no distension. There is no rebound and no guarding.  RLQ tenderness, no guarding, mild upper quadrant tendermess.  Musculoskeletal: Normal range of motion.  Complaining of pain to the right paraspinal lumbar region. No midline tenderness   Neurological: He is alert and oriented to person, place, and time.  Skin: No rash noted. No pallor.  Psychiatric: He has a normal mood and affect. His behavior is normal.  Nursing note and vitals reviewed.   ED Course  Procedures  .DIAGNOSTIC STUDIES: Oxygen Saturation is 94% on room air, normal by my interpretation.    COORDINATION OF CARE: 6:29 PM Will order blood gas screening. Discussed treatment plan with pt at bedside and pt agreed to plan.   Labs Review Labs Reviewed  BASIC METABOLIC PANEL - Abnormal; Notable for the following:    Glucose, Bld 201 (*)    All other components within normal limits  URINALYSIS, ROUTINE W REFLEX MICROSCOPIC (NOT AT [facility]) - Abnormal; Notable for the following:    Specific Gravity, Urine 1.043 (*)    Glucose, UA >1000 (*)    All other components within normal limits  URINE MICROSCOPIC-ADD ON - Abnormal; Notable for the following:    Squamous Epithelial / LPF 0-5 (*)    Bacteria, UA RARE (*)    All other components  within normal limits  CBG MONITORING, ED - Abnormal; Notable for the following:    Glucose-Capillary 243 (*)    All other components within normal limits  I-STAT VENOUS BLOOD GAS, ED - Abnormal; Notable for the following:    pH, Ven 7.418 (*)    pCO2, Ven 42.7 (*)    pO2, Ven 50.0 (*)    Bicarbonate 27.5 (*)    Acid-Base Excess 3.0 (*)    All other components within normal limits  CBC WITH DIFFERENTIAL/PLATELET    Imaging Review No results found. I have personally reviewed and evaluated these images and lab results as part of my medical decision-making.   EKG Interpretation None      MDM   Final diagnoses:  Hyperglycemia    64  yo M with a chief complaint of hyperglycemia. Patient saw his family doctor yesterday and had his medications changed. Feels that his blood sugar continues to be elevated. Having a little bit of lightheadedness associated with this. Patient was given a fluid bolus and feels much better. Blood sugar here is in the 200s. No anion gap. No ketones. Patient is non-DKA. Feel safe for discharge home. Well-appearing and nontoxic. Able to take by mouth without difficulty. Had some abdominal pain on exam but no noted abdominal pain at baseline.  I personally performed the services described in this documentation, which was scribed in my presence. The recorded information has been reviewed and is accurate.    I have discussed the diagnosis/risks/treatment options with the patient and family and believe the pt to be eligible for discharge home to follow-up with PCP. We also discussed returning to the ED immediately if new or worsening sx occur. We discussed the sx which are most concerning (e.g., sudden worsening pain, fever, inability to tolerate by mouth) that necessitate immediate return. Medications administered to the patient during their visit and any new prescriptions provided to the patient are listed below.  Medications given during this visit Medications   sodium chloride 0.9 % bolus 1,000 mL (0 mLs Intravenous Stopped 06/25/15 1926)    Discharge Medication List as of 06/25/2015  7:11 PM      The patient appears reasonably screen and/or stabilized for discharge and I doubt any other medical condition or other [facility] requiring further screening, evaluation, or treatment in the ED at this time prior to discharge.     Melene Plan, DO 06/25/15 2325

## 2015-06-25 NOTE — ED Notes (Addendum)
C/o elevated BS, dizziness, nausea, HA x 2-3 days-NAD-steady gait to triage-seen by PCP yesterday for same-increased lantus from 20u 30u

## 2015-06-25 NOTE — ED Notes (Signed)
Pt given a warm blanket 

## 2015-06-25 NOTE — Progress Notes (Signed)
Quick Note:  Labs look ok ______ 

## 2015-06-26 ENCOUNTER — Ambulatory Visit: Payer: 59 | Admitting: Family Medicine

## 2015-07-01 ENCOUNTER — Encounter: Payer: Self-pay | Admitting: Physician Assistant

## 2015-07-01 ENCOUNTER — Ambulatory Visit (INDEPENDENT_AMBULATORY_CARE_PROVIDER_SITE_OTHER): Payer: 59 | Admitting: Physician Assistant

## 2015-07-01 VITALS — BP 110/60 | HR 80 | Ht 70.0 in | Wt 284.4 lb

## 2015-07-01 DIAGNOSIS — E785 Hyperlipidemia, unspecified: Secondary | ICD-10-CM

## 2015-07-01 DIAGNOSIS — R0602 Shortness of breath: Secondary | ICD-10-CM | POA: Diagnosis not present

## 2015-07-01 DIAGNOSIS — I10 Essential (primary) hypertension: Secondary | ICD-10-CM

## 2015-07-01 DIAGNOSIS — R079 Chest pain, unspecified: Secondary | ICD-10-CM | POA: Diagnosis not present

## 2015-07-01 DIAGNOSIS — E669 Obesity, unspecified: Secondary | ICD-10-CM | POA: Diagnosis not present

## 2015-07-01 NOTE — Progress Notes (Signed)
Cardiology Office Note Date:  07/01/2015  Patient ID:  Gregory WANDLER [name], DOB 1950-08-16, MRN [id number] PCP:  Clementeen Graham, MD  Cardiologist:  Dr. Katrinka Blazing  Chief Complaint: chest pain  History of Present Illness: Gregory GOOSTREE [name] is a 65 y.o. male with history of DM, OSA, HLD, obesity, prior hypertensive response to exercise, and chest pain with prior negative cardiac workup who presents for f/u. He had a nuclear stress test in 2001 that was reportedly abnormal leading to cardiac cath which revealed normal coronary arteries. He underwent a f/u exercise treadmill test in 2012 for vague chest discomfort which was negative for ischemia. He was last seen by cardiology in 04/2014 for recurrent atypical chest discomfort. ETT 05/2014 showed fair exercise capacity with chest burning in the setting of hypertensive response to exercise but no significant ST changes to suggest ischemia.   He recently saw his PCP with complaints of episodic SOB and chest discomfort. He described the CP as "burning"/congestion. His wife began to notice he was having to breathe harder with exertion. BP on 06/04/15 was up to 150/75 and his weight had increased to 293lb. CXR showed mild bronchitic changes. He was treated with antibiotic and steroids with gradual improvement in his chest discomfort and dyspnea. He also noticed his symptoms seemed to be easing off when his weight returned to baseline around 284-285. However, he has noticed residual dyspnea while doing PT for his low back. While doing the bike, towards the very end of exercise, he's had residual lingering dyspnea and possibly some chest discomfort as well (although describes this as a feeling of SOB rather than pain). He does feel like he is gaining endurance on the bike -- started out at "level 3," now at "level 5.5" -- but has not returned to baseline yet. BMET 06/25/15 showed glu 201, otherwise normal along with normal CBC.    Past Medical History    Diagnosis Date  . Arthritis   . Diabetes mellitus without complication (HCC)   . Obstructive sleep apnea     Currently untreated   . Hyperlipidemia   . Obesity   . Hypertensive response to exercise   . Chest pain     a. Normal cors 2001. b. Neg stress test 2012, 05/2014.    Past Surgical History  Procedure Laterality Date  . Hernia repair    . Knee arthroscopy  1984    both knee  . Cervical fusion      Current Outpatient Prescriptions  Medication Sig Dispense Refill  . ANDROGEL PUMP 20.25 MG/ACT (1.62%) GEL     . aspirin 81 MG tablet Take 81 mg by mouth daily.    . canagliflozin (INVOKANA) 300 MG TABS tablet Take 1 tablet (300 mg total) by mouth daily before breakfast. 90 tablet 0  . Cholecalciferol (VITAMIN D3) 1000 units CAPS Take 6,000 Units by mouth daily.    . insulin glargine (LANTUS) 100 UNIT/ML injection Inject 30 Units into the skin 2 (two) times daily.     Marland Kitchen LIVALO 2 MG TABS Take 1 tablet by mouth.     . losartan (COZAAR) 50 MG tablet Take 50 mg by mouth daily.     . metFORMIN (GLUCOPHAGE-XR) 500 MG 24 hr tablet Take 1,500 mg by mouth daily with breakfast.     . Omega-3 Fatty Acids (FISH OIL PO) Take 2,000 mg by mouth daily.     Letta Pate VERIO test strip 1 each by Other route as needed.     Marland Kitchen  pioglitazone (ACTOS) 15 MG tablet Take 15 mg by mouth daily.      No current facility-administered medications for this visit.    Allergies:   Statins and Victoza   Social History:  The patient  reports that he has never smoked. He does not have any smokeless tobacco history on file. He reports that he does not drink alcohol or use illicit drugs.   Family History:  The patient's family history includes Cancer in his father; Diabetes in his maternal grandfather, maternal grandmother, mother, and sister; Heart attack in his maternal grandfather, maternal grandmother, mother, paternal grandfather, and paternal grandmother; Heart disease in his father and mother; Hyperlipidemia  in his sister; Hypertension in his maternal grandfather, mother, sister, and sister; Stroke in his maternal grandmother and mother.  ROS:  Please see the history of present illness.  All other systems are reviewed and otherwise negative.   PHYSICAL EXAM:  VS:  BP 110/60 mmHg  Pulse 80  Ht  (1.778 m)  Wt 284 lb 6.4 oz (129.003 kg)  BMI 40.81 kg/m2 BMI: Body mass index is 40.81 kg/(m^2). Recheck BP by me was 132/82. Well nourished, well developed obese AAM, in no acute distress HEENT: normocephalic, atraumatic Neck: no JVD, carotid bruits or masses Cardiac:  normal S1, S2; RRR; no murmurs, rubs, or gallops Lungs:  clear to auscultation bilaterally, no wheezing, rhonchi or rales Abd: soft, nontender, no hepatomegaly, + BS MS: no deformity or atrophy Ext: no edema Skin: warm and dry, no rash Neuro:  moves all extremities spontaneously, no focal abnormalities noted, follows commands Psych: euthymic mood, full affect  EKG:  Done today shows NSR 80bpm, nonspecific T Wave changes avL, otherwise nonacute.  Recent Labs: 06/24/2015: ALT 22 06/25/2015: BUN 14; Creatinine, Ser 0.89; Hemoglobin 14.4; Platelets 166; Potassium 3.8; Sodium 141  04/01/2015: Cholesterol 217*; HDL 47; LDL Cholesterol 148; Triglycerides 112; VLDL Cholesterol, Calc 22   Estimated Creatinine Clearance: 114.6 mL/min (by C-G formula based on Cr of 0.89).   Wt Readings from Last 3 Encounters:  07/01/15 284 lb 6.4 oz (129.003 kg)  06/25/15 285 lb (129.275 kg)  06/24/15 285 lb (129.275 kg)     Other studies reviewed: Additional studies/records reviewed today include: summarized above  ASSESSMENT AND PLAN:  1. Chest pain/SOB - symptoms have gradually improved with treatment of antibiotics, steroids, weight loss, and physical therapy. However, he does not feel he has completely returned to baseline. We discussed options of surveillance (given gradual improvement) versus proceeding with updated stress testing to  further evaluate. He has elected the latter. He says he can walk on a treadmill so will order exercise myoview. Continue risk factor reduction. 2. Essential HTN - recent BP's have improved. Continue current regimen.  3. Obesity with OSA - we discussed recommendation of long-term weight loss. He has not been compliant with his Bipap for several months. His PCP recommended he try putting it on before bed so that he can get accustomed to the sensation before he lays down for the night. 4. Hyperlipidemia - followed by PCP/endocrinologist.  Disposition: F/u with Dr. Katrinka Blazing in 6 months.  Current medicines are reviewed at length with the patient today.  The patient did not have any concerns regarding medicines.  Thomasene Mohair PA-C 07/01/2015 4:18 PM     [facility] [address] [place] [address] [phone] (office)  [phone] (fax)

## 2015-07-01 NOTE — Patient Instructions (Addendum)
Medication Instructions:  Your physician recommends that you continue on your current medications as directed. Please refer to the Current Medication list given to you today.   Labwork: NONE ORDERED  Testing/Procedures: Your physician has requested that you have en exercise stress myoview (EARLY A.M. TO HOLD MEDICATIONS UNTIL AFTERWARDS)  . For further information please visit [email or web address] Please follow instruction sheet, as given.    Follow-Up: Your physician wants you to follow-up in: 6 MONTHS WITH DR. Katrinka Blazing.  You will receive a reminder letter in the mail two months in advance. If you don't receive a letter, please call our office to schedule the follow-up appointment.   Any Other Special Instructions Will Be Listed Below (If Applicable).   If you need a refill on your cardiac medications before your next appointment, please call your pharmacy.

## 2015-07-02 ENCOUNTER — Encounter: Payer: Self-pay | Admitting: Family Medicine

## 2015-07-02 ENCOUNTER — Telehealth (HOSPITAL_COMMUNITY): Payer: Self-pay | Admitting: *Deleted

## 2015-07-02 ENCOUNTER — Ambulatory Visit (INDEPENDENT_AMBULATORY_CARE_PROVIDER_SITE_OTHER): Payer: 59 | Admitting: Family Medicine

## 2015-07-02 VITALS — BP 130/80 | HR 76 | Wt 284.0 lb

## 2015-07-02 DIAGNOSIS — Z794 Long term (current) use of insulin: Secondary | ICD-10-CM | POA: Diagnosis not present

## 2015-07-02 DIAGNOSIS — E119 Type 2 diabetes mellitus without complications: Secondary | ICD-10-CM

## 2015-07-02 DIAGNOSIS — E118 Type 2 diabetes mellitus with unspecified complications: Secondary | ICD-10-CM | POA: Diagnosis not present

## 2015-07-02 LAB — GLUCOSE, POCT (MANUAL RESULT ENTRY): POC GLUCOSE: 177 mg/dL — AB (ref 70–99)

## 2015-07-02 NOTE — Telephone Encounter (Signed)
Left message on voicemail in reference to upcoming appointment scheduled for 07/08/15. Phone number given for a call back so details instructions can be given. Ayush Boulet J Aidel Davisson, RN 

## 2015-07-02 NOTE — Telephone Encounter (Signed)
Patient given detailed instructions per Myocardial Perfusion Study Information Sheet for the test on 07/08/15 at 1230. Patient notified to arrive 15 minutes early and that it is imperative to arrive on time for appointment to keep from having the test rescheduled.  If you need to cancel or reschedule your appointment, please call the office within 24 hours of your appointment. Failure to do so may result in a cancellation of your appointment, and a $50 no show fee. Patient verbalized understanding.Antionette Char, RN

## 2015-07-02 NOTE — Assessment & Plan Note (Signed)
Doing well. Follow-up with endocrinology. Get records sent over. Recheck in a few months.

## 2015-07-02 NOTE — Progress Notes (Signed)
Gregory Governor Gregory [name] is a 65 y.o. male who presents to [name] today for follow-up diabetes exacerbation. Patient was recently seen for hyperglycemia due to a combination of not being able to get his Invokana and steroids. He's feeling much better. He has accessed Invokana. No chest pain palpitations shortness of breath fevers or chills.   Past Medical History  Diagnosis Date  . Arthritis   . Diabetes mellitus without complication (HCC)   . Obstructive sleep apnea     Currently untreated   . Hyperlipidemia   . Obesity   . Hypertensive response to exercise   . Chest pain     a. Normal cors 2001. b. Neg stress test 2012, 05/2014.   Past Surgical History  Procedure Laterality Date  . Hernia repair    . Knee arthroscopy  1984    both knee  . Cervical fusion     Social History  Substance Use Topics  . Smoking status: Never Smoker   . Smokeless tobacco: Not on file  . Alcohol Use: No   family history includes Cancer in his father; Diabetes in his maternal grandfather, maternal grandmother, mother, and sister; Heart attack in his maternal grandfather, maternal grandmother, mother, paternal grandfather, and paternal grandmother; Heart disease in his father and mother; Hyperlipidemia in his sister; Hypertension in his maternal grandfather, mother, sister, and sister; Stroke in his maternal grandmother and mother.  ROS as above Medications: Current Outpatient Prescriptions  Medication Sig Dispense Refill  . ANDROGEL PUMP 20.25 MG/ACT (1.62%) GEL     . aspirin 81 MG tablet Take 81 mg by mouth daily.    . canagliflozin (INVOKANA) 300 MG TABS tablet Take 1 tablet (300 mg total) by mouth daily before breakfast. 90 tablet 0  . Cholecalciferol (VITAMIN D3) 1000 units CAPS Take 6,000 Units by mouth daily.    . insulin glargine (LANTUS) 100 UNIT/ML injection Inject 30 Units into the  skin 2 (two) times daily.     Marland Kitchen LIVALO 2 MG TABS Take 1 tablet by mouth.     . losartan (COZAAR) 50 MG tablet Take 50 mg by mouth daily.     . metFORMIN (GLUCOPHAGE-XR) 500 MG 24 hr tablet Take 1,500 mg by mouth daily with breakfast.     . Omega-3 Fatty Acids (FISH OIL PO) Take 2,000 mg by mouth daily.     Letta Pate VERIO test strip 1 each by Other route as needed.     . pioglitazone (ACTOS) 15 MG tablet Take 15 mg by mouth daily.      No current facility-administered medications for this visit.   Allergies  Allergen Reactions  . Statins     Muscle cramping all over body. Lipitor, Crestor, and pravastatin.  Verdis Prime [Liraglutide] Other (See Comments)    Caused pancreatitis     Exam:  BP 130/80 mmHg  Pulse 76  Wt 284 lb (128.822 kg) Gen: Well NAD HEENT: EOMI,  MMM Lungs: Normal work of breathing. CTABL Heart: RRR no MRG Abd: NABS, Soft. Nondistended, Nontender Exts: Brisk capillary refill, warm and well perfused.   Results for orders placed or performed in visit on 07/02/15 (from the past 24 hour(s))  POCT glucose (manual entry)     Status: Abnormal   Collection Time: 07/02/15  3:46 PM  Result Value Ref Range   POC Glucose 177 (A) 70 - 99 mg/dl   No results found.   Please see individual assessment  and plan sections.

## 2015-07-02 NOTE — Patient Instructions (Signed)
Thank you for coming in today. Return in 2 months.  You should have a 3 month supply of Invokana at the pharmacy.  Call or go to the emergency room if you get worse, have trouble breathing, have chest pains, or palpitations.  Make sure your endocrinologist sends records.

## 2015-07-08 ENCOUNTER — Ambulatory Visit (HOSPITAL_COMMUNITY): Payer: 59 | Attending: Cardiovascular Disease

## 2015-07-08 DIAGNOSIS — R0609 Other forms of dyspnea: Secondary | ICD-10-CM | POA: Diagnosis not present

## 2015-07-08 DIAGNOSIS — R0602 Shortness of breath: Secondary | ICD-10-CM

## 2015-07-08 DIAGNOSIS — R079 Chest pain, unspecified: Secondary | ICD-10-CM | POA: Diagnosis not present

## 2015-07-08 MED ORDER — TECHNETIUM TC 99M SESTAMIBI GENERIC - CARDIOLITE
32.8000 | Freq: Once | INTRAVENOUS | Status: AC | PRN
  Administered 2015-07-08: 32.8 via INTRAVENOUS

## 2015-07-09 ENCOUNTER — Ambulatory Visit (HOSPITAL_COMMUNITY): Payer: 59 | Attending: Cardiovascular Disease

## 2015-07-09 LAB — MYOCARDIAL PERFUSION IMAGING
CHL CUP NUCLEAR SDS: 1
CHL CUP NUCLEAR SRS: 0
CHL CUP NUCLEAR SSS: 1
CHL CUP RESTING HR STRESS: 76 {beats}/min
CHL RATE OF PERCEIVED EXERTION: 18
CSEPHR: 92 %
Estimated workload: 7 METS
Exercise duration (min): 5 min
Exercise duration (sec): 30 s
LV dias vol: 122 mL
LV sys vol: 57 mL
MPHR: 157 {beats}/min
NUC STRESS TID: 1.09
Peak HR: 144 {beats}/min
RATE: 0.33

## 2015-07-09 MED ORDER — TECHNETIUM TC 99M SESTAMIBI GENERIC - CARDIOLITE
31.9000 | Freq: Once | INTRAVENOUS | Status: AC | PRN
  Administered 2015-07-09: 31.9 via INTRAVENOUS

## 2015-07-31 ENCOUNTER — Encounter: Payer: Self-pay | Admitting: Family Medicine

## 2015-07-31 ENCOUNTER — Ambulatory Visit (INDEPENDENT_AMBULATORY_CARE_PROVIDER_SITE_OTHER): Payer: 59 | Admitting: Family Medicine

## 2015-07-31 VITALS — BP 134/75 | HR 89 | Wt 287.0 lb

## 2015-07-31 DIAGNOSIS — Z794 Long term (current) use of insulin: Secondary | ICD-10-CM | POA: Diagnosis not present

## 2015-07-31 DIAGNOSIS — E118 Type 2 diabetes mellitus with unspecified complications: Secondary | ICD-10-CM | POA: Diagnosis not present

## 2015-07-31 DIAGNOSIS — Z23 Encounter for immunization: Secondary | ICD-10-CM

## 2015-07-31 DIAGNOSIS — N400 Enlarged prostate without lower urinary tract symptoms: Secondary | ICD-10-CM

## 2015-07-31 MED ORDER — TAMSULOSIN HCL 0.4 MG PO CAPS: 0.4000 mg | ORAL_CAPSULE | Freq: Every day | ORAL | Status: DC

## 2015-07-31 NOTE — Patient Instructions (Signed)
Thank you for coming in today. Get PSA blood test today.  Return in 2-3 months.  Continue with diabetes doctor.  If your belly pain worsens, or you have high fever, bad vomiting, blood in your stool or black tarry stool go to the Emergency Room.   Benign Prostatic Hyperplasia An enlarged prostate (benign prostatic hyperplasia) is common in older men. You may experience the following:  Weak urine stream.  Dribbling.  Feeling like the bladder has not emptied completely.  Difficulty starting urination.  Getting up frequently at night to urinate.  Urinating more frequently during the day. HOME CARE INSTRUCTIONS  Monitor your prostatic hyperplasia for any changes. The following actions may help to alleviate any discomfort you are experiencing:  Give yourself time when you urinate.  Stay away from alcohol.  Avoid beverages containing caffeine, such as coffee, tea, and colas, because they can make the problem worse.  Avoid decongestants, antihistamines, and some prescription medicines that can make the problem worse.  Follow up with your health care provider for further treatment as recommended. SEEK MEDICAL CARE IF:  You are experiencing progressive difficulty voiding.  Your urine stream is progressively getting narrower.  You are awaking from sleep with the urge to void more frequently.  You are constantly feeling the need to void.  You experience loss of urine, especially in small amounts. SEEK IMMEDIATE MEDICAL CARE IF:   You develop increased pain with urination or are unable to urinate.  You develop severe abdominal pain, vomiting, a high fever, or fainting.  You develop back pain or blood in your urine. MAKE SURE YOU:   Understand these instructions.  Will watch your condition.  Will get help right away if you are not doing well or get worse.   This information is not intended to replace advice given to you by your health care provider. Make sure you discuss  any questions you have with your health care provider.   Document Released: 05/17/2005 Document Revised: 06/07/2014 Document Reviewed: 10/17/2012 Elsevier Interactive Patient Education [employer]

## 2015-07-31 NOTE — Progress Notes (Signed)
Gregory Governor Rooks [name] is a 65 y.o. male who presents to [facility]: Primary Care today for follow-up diabetes and discuss possibility of prostate problems.  Diabetes: Managed per endocrinology. Blood sugars are improving. He currently takes Lantus 40 units in the morning and 30 units in the PM. He notes his typical fasting blood sugars are in the 150s.  Prostate: Patient notes difficulty urinating for sometime now. He did feels as though he does not fully empty his bladder. He denies significant pelvic pain or pressure. He has never had a full evaluation for this problem yet.   AUA symptom score is 22   Past Medical History  Diagnosis Date  . Arthritis   . Diabetes mellitus without complication (HCC)   . Obstructive sleep apnea     Currently untreated   . Hyperlipidemia   . Obesity   . Hypertensive response to exercise   . Chest pain     a. Normal cors 2001. b. Neg stress test 2012, 05/2014.   Past Surgical History  Procedure Laterality Date  . Hernia repair    . Knee arthroscopy  1984    both knee  . Cervical fusion     Social History  Substance Use Topics  . Smoking status: Never Smoker   . Smokeless tobacco: Not on file  . Alcohol Use: No   family history includes Cancer in his father; Diabetes in his maternal grandfather, maternal grandmother, mother, and sister; Heart attack in his maternal grandfather, maternal grandmother, mother, paternal grandfather, and paternal grandmother; Heart disease in his father and mother; Hyperlipidemia in his sister; Hypertension in his maternal grandfather, mother, sister, and sister; Stroke in his maternal grandmother and mother.  ROS as above Medications: Current Outpatient Prescriptions  Medication Sig Dispense Refill  . ANDROGEL PUMP 20.25 MG/ACT (1.62%) GEL     . aspirin 81 MG tablet Take 81 mg by mouth daily.    . canagliflozin  (INVOKANA) 300 MG TABS tablet Take 1 tablet (300 mg total) by mouth daily before breakfast. 90 tablet 0  . Cholecalciferol (VITAMIN D3) 1000 units CAPS Take 6,000 Units by mouth daily.    . insulin glargine (LANTUS) 100 UNIT/ML injection Inject 30 Units into the skin 2 (two) times daily.     Marland Kitchen LIVALO 2 MG TABS Take 1 tablet by mouth.     . losartan (COZAAR) 50 MG tablet Take 50 mg by mouth daily.     . metFORMIN (GLUCOPHAGE-XR) 500 MG 24 hr tablet Take 1,500 mg by mouth daily with breakfast.     . ONETOUCH VERIO test strip 1 each by Other route as needed.     . pioglitazone (ACTOS) 15 MG tablet Take 15 mg by mouth daily.     . tamsulosin (FLOMAX) 0.4 MG CAPS capsule Take 1 capsule (0.4 mg total) by mouth daily. 30 capsule 3   No current facility-administered medications for this visit.   Allergies  Allergen Reactions  . Statins     Muscle cramping all over body. Lipitor, Crestor, and pravastatin.  Verdis Prime [Liraglutide] Other (See Comments)    Caused pancreatitis     Exam:  BP 134/75 mmHg  Pulse 89  Wt 287 lb (130.182 kg) Gen: Well NAD HEENT: EOMI,  MMM Lungs: Normal work of breathing. CTABL Heart: RRR no MRG Abd: NABS, Soft. Nondistended, Nontender Exts: Brisk capillary refill, warm and well perfused.  Prostate: Slightly enlarged no nodules not boggy nontender.  No  results found for this or any previous visit (from the past 24 hour(s)). No results found.   Please see individual assessment and plan sections.

## 2015-07-31 NOTE — Assessment & Plan Note (Signed)
Doing reasonably well. Continue management per endocrinology.

## 2015-07-31 NOTE — Assessment & Plan Note (Signed)
Symptoms likely due to BPH. PSA pending. Treatment with Flomax. Recheck in a month or 2.

## 2015-08-01 LAB — PSA: PSA: 1.12 ng/mL (ref <=4.00)

## 2015-08-01 NOTE — Progress Notes (Signed)
Quick Note:  PSA test was normal ______

## 2015-10-01 ENCOUNTER — Ambulatory Visit (INDEPENDENT_AMBULATORY_CARE_PROVIDER_SITE_OTHER): Payer: 59 | Admitting: Family Medicine

## 2015-10-01 ENCOUNTER — Encounter: Payer: Self-pay | Admitting: Family Medicine

## 2015-10-01 VITALS — BP 143/81 | HR 76 | Wt 287.0 lb

## 2015-10-01 DIAGNOSIS — E118 Type 2 diabetes mellitus with unspecified complications: Secondary | ICD-10-CM

## 2015-10-01 DIAGNOSIS — N400 Enlarged prostate without lower urinary tract symptoms: Secondary | ICD-10-CM | POA: Diagnosis not present

## 2015-10-01 DIAGNOSIS — F411 Generalized anxiety disorder: Secondary | ICD-10-CM | POA: Diagnosis not present

## 2015-10-01 DIAGNOSIS — Z794 Long term (current) use of insulin: Secondary | ICD-10-CM

## 2015-10-01 LAB — BASIC METABOLIC PANEL
BUN: 9 mg/dL (ref 4–21)
Creatinine: 0.8 mg/dL (ref 0.6–1.3)
GLUCOSE: 111 mg/dL

## 2015-10-01 LAB — HEMOGLOBIN A1C: HEMOGLOBIN A1C: 8.9

## 2015-10-01 LAB — CBC AND DIFFERENTIAL
HEMOGLOBIN: 14.7 g/dL (ref 13.5–17.5)
Platelets: 145 10*3/uL — AB (ref 150–399)
WBC: 4.3 10*3/mL

## 2015-10-01 LAB — LIPID PANEL
Cholesterol: 143 mg/dL (ref 0–200)
HDL: 31 mg/dL — AB (ref 35–70)
LDL CALC: 93 mg/dL
LDl/HDL Ratio: 3
TRIGLYCERIDES: 95 mg/dL (ref 40–160)

## 2015-10-01 LAB — VITAMIN D 25 HYDROXY (VIT D DEFICIENCY, FRACTURES): Vitamin D, 25-Hydroxy: 62

## 2015-10-01 LAB — TESTOSTERONE: TESTOSTERONE: 124

## 2015-10-01 MED ORDER — TAMSULOSIN HCL 0.4 MG PO CAPS: 0.4000 mg | ORAL_CAPSULE | Freq: Every day | ORAL | Status: DC

## 2015-10-01 NOTE — Patient Instructions (Signed)
Thank you for coming in today. Take flomax daily . Return in 3 months or sooner if needed.   Generalized Anxiety Disorder Generalized anxiety disorder (GAD) is a mental disorder. It interferes with life functions, including relationships, work, and school. GAD is different from normal anxiety, which everyone experiences at some point in their lives in response to specific life events and activities. Normal anxiety actually helps us prepare for and get through these life events and activities. Normal anxiety goes away after the event or activity is over.  GAD causes anxiety that is not necessarily related to specific events or activities. It also causes excess anxiety in proportion to specific events or activities. The anxiety associated with GAD is also difficult to control. GAD can vary from mild to severe. People with severe GAD can have intense waves of anxiety with physical symptoms (panic attacks).  SYMPTOMS The anxiety and worry associated with GAD are difficult to control. This anxiety and worry are related to many life events and activities and also occur more days than not for 6 months or longer. People with GAD also have three or more of the following symptoms (one or more in children):  Restlessness.   Fatigue.  Difficulty concentrating.   Irritability.  Muscle tension.  Difficulty sleeping or unsatisfying sleep. DIAGNOSIS GAD is diagnosed through an assessment by your health care provider. Your health care provider will ask you questions aboutyour mood,physical symptoms, and events in your life. Your health care provider may ask you about your medical history and use of alcohol or drugs, including prescription medicines. Your health care provider may also do a physical exam and blood tests. Certain medical conditions and the use of certain substances can cause symptoms similar to those associated with GAD. Your health care provider may refer you to a mental health specialist  for further evaluation. TREATMENT The following therapies are usually used to treat GAD:   Medication. Antidepressant medication usually is prescribed for long-term daily control. Antianxiety medicines may be added in severe cases, especially when panic attacks occur.   Talk therapy (psychotherapy). Certain types of talk therapy can be helpful in treating GAD by providing support, education, and guidance. A form of talk therapy called cognitive behavioral therapy can teach you healthy ways to think about and react to daily life events and activities.  Stress managementtechniques. These include yoga, meditation, and exercise and can be very helpful when they are practiced regularly. A mental health specialist can help determine which treatment is best for you. Some people see improvement with one therapy. However, other people require a combination of therapies.   This information is not intended to replace advice given to you by your health care provider. Make sure you discuss any questions you have with your health care provider.   Document Released: 09/11/2012 Document Revised: 06/07/2014 Document Reviewed: 09/11/2012 Elsevier Interactive Patient Education [employer]2016 Elsevier Inc.

## 2015-10-02 DIAGNOSIS — F411 Generalized anxiety disorder: Secondary | ICD-10-CM | POA: Insufficient documentation

## 2015-10-02 NOTE — Assessment & Plan Note (Signed)
Currently being laid off. Will continue to follow.

## 2015-10-02 NOTE — Assessment & Plan Note (Signed)
Awaiting labs from endocrinology. Will defer management.

## 2015-10-02 NOTE — Assessment & Plan Note (Signed)
I don't think Mr Gregory West ever took the flomax. I will re-prescribe it and we will recheck in a few months with repeat AUA score sheet.

## 2015-10-02 NOTE — Progress Notes (Signed)
Gregory Governor Rooks [name] is a 65 y.o. male who presents to [facility]: Primary Care today for follow up Diabetes, BPH, and discuss new stress.   1) Diabetes: Co-managed with endocrinology. Patient notes that the fasting sugars are typically well controlled. He denies any low or high blood sugars or excessive urination. He had labs yesterday at Endocrinology.   2) BPH: At the last visit Gregory West was thought to have BPH and was prescribed flomax. He does not think he started taking it. He essentially can remember if he took it or not. He notes that he feels about the same.   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 This is distressing obviously. He feels more anxious than is typical. He is not currently interested in medicine or counseling currently.   Past Medical History  Diagnosis Date  . Arthritis   . Diabetes mellitus without complication (HCC)   . Obstructive sleep apnea     Currently untreated   . Hyperlipidemia   . Obesity   . Hypertensive response to exercise   . Chest pain     a. Normal cors 2001. b. Neg stress test 2012, 05/2014.   Past Surgical History  Procedure Laterality Date  . Hernia repair    . Knee arthroscopy  1984    both knee  . Cervical fusion     Social History  Substance Use Topics  . Smoking status: Never Smoker   . Smokeless tobacco: Not on file  . Alcohol Use: No   family history includes Cancer in his father; Diabetes in his maternal grandfather, maternal grandmother, mother, and sister; Heart attack in his maternal grandfather, maternal grandmother, mother, paternal grandfather, and paternal grandmother; Heart disease in his father and mother; Hyperlipidemia in his sister; Hypertension in his maternal grandfather, mother, sister, and sister; Stroke in his maternal grandmother and mother.  ROS as  above Medications: Current Outpatient Prescriptions  Medication Sig Dispense Refill  . ANDROGEL PUMP 20.25 MG/ACT (1.62%) GEL     . aspirin 81 MG tablet Take 81 mg by mouth daily.    . canagliflozin (INVOKANA) 300 MG TABS tablet Take 1 tablet (300 mg total) by mouth daily before breakfast. 90 tablet 0  . Cholecalciferol (VITAMIN D3) 1000 units CAPS Take 6,000 Units by mouth daily.    . insulin glargine (LANTUS) 100 UNIT/ML injection Inject 30 Units into the skin 2 (two) times daily.     Marland Kitchen LIVALO 2 MG TABS Take 1 tablet by mouth.     . losartan (COZAAR) 50 MG tablet Take 50 mg by mouth daily.     . metFORMIN (GLUCOPHAGE-XR) 500 MG 24 hr tablet Take 1,500 mg by mouth daily with breakfast.     . ONETOUCH VERIO test strip 1 each by Other route as needed.     . pioglitazone (ACTOS) 15 MG tablet Take 15 mg by mouth daily.     . tamsulosin (FLOMAX) 0.4 MG CAPS capsule Take 1 capsule (0.4 mg total) by mouth daily. 90 capsule 1   No current facility-administered medications for this visit.   Allergies  Allergen Reactions  . Statins     Muscle cramping all over body. Lipitor, Crestor, and pravastatin.  Verdis Prime [Liraglutide] Other (See Comments)    Caused pancreatitis     Exam:  BP 143/81 mmHg  Pulse 76  Wt 287 lb (130.182 kg) Gen: Well NAD HEENT: EOMI,  MMM Lungs: Normal work of breathing. CTABL Heart: RRR no MRG Abd: NABS, Soft. Nondistended, Nontender Exts: Brisk capillary refill, warm and well perfused.  Psych: Alert and oriented. Normal affect speech and thought process.  GAD7: 7 PHQ9: 8  No results found for this or any previous visit (from the past 24 hour(s)). No results found.   Please see individual assessment and plan sections.

## 2015-10-31 ENCOUNTER — Encounter: Payer: Self-pay | Admitting: Family Medicine

## 2015-11-05 ENCOUNTER — Telehealth: Payer: Self-pay

## 2015-11-05 MED ORDER — TADALAFIL 5 MG PO TABS: 5.0000 mg | ORAL_TABLET | Freq: Every day | ORAL | Status: DC

## 2015-11-05 NOTE — Telephone Encounter (Signed)
Pt called regarding office notes from his endocrinologist scan into chart on 10/02/15. He would like to know if your agree with their recommendation to start cialis 5mg . If so he would like a rx for this sent to: [facility] [address] [facility] [place], [place]  [phone] (Phone) [phone] (Fax)  Thank you.

## 2015-11-05 NOTE — Telephone Encounter (Signed)
I have sent in Cialis. This will need to be prior authorized.

## 2015-11-06 NOTE — Telephone Encounter (Signed)
Called and left detailed vm advising pt that rx was sent and may need prior auth.

## 2015-12-31 ENCOUNTER — Ambulatory Visit (INDEPENDENT_AMBULATORY_CARE_PROVIDER_SITE_OTHER): Payer: 59 | Admitting: Family Medicine

## 2015-12-31 ENCOUNTER — Encounter: Payer: Self-pay | Admitting: Family Medicine

## 2015-12-31 VITALS — BP 106/68 | HR 76 | Wt 286.0 lb

## 2015-12-31 DIAGNOSIS — Z794 Long term (current) use of insulin: Secondary | ICD-10-CM

## 2015-12-31 DIAGNOSIS — F411 Generalized anxiety disorder: Secondary | ICD-10-CM | POA: Diagnosis not present

## 2015-12-31 DIAGNOSIS — E118 Type 2 diabetes mellitus with unspecified complications: Secondary | ICD-10-CM | POA: Diagnosis not present

## 2015-12-31 DIAGNOSIS — G4733 Obstructive sleep apnea (adult) (pediatric): Secondary | ICD-10-CM

## 2015-12-31 DIAGNOSIS — N400 Enlarged prostate without lower urinary tract symptoms: Secondary | ICD-10-CM | POA: Diagnosis not present

## 2015-12-31 DIAGNOSIS — E119 Type 2 diabetes mellitus without complications: Secondary | ICD-10-CM

## 2015-12-31 LAB — LIPID PANEL
CHOLESTEROL: 212 mg/dL — AB (ref 0–200)
HDL: 49 mg/dL (ref 35–70)
LDL Cholesterol: 141 mg/dL
Triglycerides: 110 mg/dL (ref 40–160)

## 2015-12-31 LAB — BASIC METABOLIC PANEL
BUN: 8 mg/dL (ref 4–21)
CREATININE: 0.8 mg/dL (ref 0.6–1.3)
GLUCOSE: 82 mg/dL
Potassium: 4.2 mmol/L (ref 3.4–5.3)
Sodium: 140 mmol/L (ref 137–147)

## 2015-12-31 LAB — HEPATIC FUNCTION PANEL
ALT: 18 U/L (ref 10–40)
AST: 18 U/L (ref 14–40)

## 2015-12-31 LAB — MICROALBUMIN, URINE: Microalb, Ur: <3

## 2015-12-31 LAB — CBC AND DIFFERENTIAL
Hemoglobin: 14.7 g/dL (ref 13.5–17.5)
PLATELETS: 169 10*3/uL (ref 150–399)
WBC: 4.3 10*3/mL

## 2015-12-31 LAB — TSH: TSH: 3.69 u[IU]/mL (ref 0.41–5.90)

## 2015-12-31 LAB — HEMOGLOBIN A1C: HEMOGLOBIN A1C: 7.6

## 2015-12-31 MED ORDER — TRAZODONE HCL 50 MG PO TABS: 25.0000 mg | ORAL_TABLET | Freq: Every evening | ORAL | 3 refills | Status: DC | PRN

## 2015-12-31 NOTE — Patient Instructions (Signed)
Thank you for coming in today. Get records from endocrinology.  Take trazodone at night.  Return in 3 months or sooner if needed.

## 2015-12-31 NOTE — Progress Notes (Signed)
Gregory Governor Rooks [name] is a 65 y.o. male who presents to [name]: Primary Care Sports Medicine today for follow-up diabetes, discuss new anxiety, and BPH.  Diabetes: Doing well with normal home glucose checks. Currently managed for endocrinology. He was seen by endocrinology yesterday and had labs drawn which are still pending. He will send records. Area polydipsia.  Anxiety: New worsening. Patient was recently laid off from his job. This is obviously distressed him. He notes mild anxiety symptoms including feeling nervous, worrying, trouble relaxing, difficulty with sleep. She denies any SI or HI. He has not used any medications. He notes he can fall asleep but has difficulty maintaining sleep.  He notes his BPH is well controlled with Cialis.   Past Medical History:  Diagnosis Date  . Arthritis   . Chest pain    a. Normal cors 2001. b. Neg stress test 2012, 05/2014.  . Diabetes mellitus without complication (HCC)   . Hyperlipidemia   . Hypertensive response to exercise   . Obesity   . Obstructive sleep apnea    Currently untreated    Past Surgical History:  Procedure Laterality Date  . CERVICAL FUSION    . HERNIA REPAIR    . KNEE ARTHROSCOPY  1984   both knee   Social History  Substance Use Topics  . Smoking status: Never Smoker  . Smokeless tobacco: Not on file  . Alcohol use No   family history includes Cancer in his father; Diabetes in his maternal grandfather, maternal grandmother, mother, and sister; Heart attack in his maternal grandfather, maternal grandmother, mother, paternal grandfather, and paternal grandmother; Heart disease in his father and mother; Hyperlipidemia in his sister; Hypertension in his maternal grandfather, mother, sister, and sister; Stroke in his maternal grandmother and mother.  ROS as above:  Medications: Current Outpatient Prescriptions    Medication Sig Dispense Refill  . ANDROGEL PUMP 20.25 MG/ACT (1.62%) GEL     . aspirin 81 MG tablet Take 81 mg by mouth daily.    . canagliflozin (INVOKANA) 300 MG TABS tablet Take 1 tablet (300 mg total) by mouth daily before breakfast. 90 tablet 0  . Cholecalciferol (VITAMIN D3) 1000 units CAPS Take 6,000 Units by mouth daily.    . insulin glargine (LANTUS) 100 UNIT/ML injection Inject 30 Units into the skin 2 (two) times daily.     Marland Kitchen LIVALO 2 MG TABS Take 1 tablet by mouth.     . losartan (COZAAR) 50 MG tablet Take 50 mg by mouth daily.     . metFORMIN (GLUCOPHAGE-XR) 500 MG 24 hr tablet Take 1,500 mg by mouth daily with breakfast.     . ONETOUCH VERIO test strip 1 each by Other route as needed.     . pioglitazone (ACTOS) 15 MG tablet Take 15 mg by mouth daily.     . tadalafil (CIALIS) 5 MG tablet Take 1 tablet (5 mg total) by mouth daily. For BPH 90 tablet 3  . tamsulosin (FLOMAX) 0.4 MG CAPS capsule Take 1 capsule (0.4 mg total) by mouth daily. 90 capsule 1  . traZODone (DESYREL) 50 MG tablet Take 0.5-1 tablets (25-50 mg total) by mouth at bedtime as needed for sleep. 30 tablet 3   No current facility-administered medications for this visit.    Allergies  Allergen Reactions  . Statins     Muscle cramping all over body. Lipitor, Crestor, and pravastatin.  Verdis Prime [Liraglutide] Other (See Comments)  Caused pancreatitis     Exam:  BP 106/68   Pulse 76   Wt 286 lb (129.7 kg)   BMI 41.04 kg/m  Gen: Well NAD HEENT: EOMI,  MMM Lungs: Normal work of breathing. CTABL Heart: RRR no MRG Abd: NABS, Soft. Nondistended, Nontender Exts: Brisk capillary refill, warm and well perfused.   Depression screen [facility] 2/9 12/31/2015  Decreased Interest 0  Down, Depressed, Hopeless 0  PHQ - 2 Score 0  Altered sleeping 2  Tired, decreased energy 1  Change in appetite 1  Feeling bad or failure about yourself  0  Trouble concentrating 1  Moving slowly or fidgety/restless 0  Suicidal  thoughts 0  PHQ-9 Score 5   GAD 7 : Generalized Anxiety Score 12/31/2015  Nervous, Anxious, on Edge 1  Control/stop worrying 1  Worry too much - different things 1  Trouble relaxing 2  Restless 1  Easily annoyed or irritable 0  Afraid - awful might happen 0  Total GAD 7 Score 6  Anxiety Difficulty Not difficult at all      No results found for this or any previous visit (from the past 24 hour(s)). No results found.    Assessment and Plan: 65 y.o. male with   1) diabetes: Doing well. Obtain records from endocrinology continue current regimen.  2) anxiety:  Mild situational. Insomnia seems to be the biggest issue. Will use trazodone. Recheck in 1-3 months.  3) BPH doing well. Continue current regimen.  No orders of the defined types were placed in this encounter.   Discussed warning signs or symptoms. Please see discharge instructions. Patient expresses understanding.

## 2016-01-20 ENCOUNTER — Encounter: Payer: Self-pay | Admitting: Family Medicine

## 2016-01-20 LAB — COMPREHENSIVE METABOLIC PANEL
BILIRUBIN TOTAL: 0.4 mg/dL
CALCIUM: 8.9
CHLORIDE, SERUM: 101
CO2: 24

## 2016-01-20 LAB — VITAMIN D, 1,25 + 25-HYDROXY: Vit D, 25-Hydroxy: 42.7

## 2016-01-20 LAB — TESTOSTERONE: TESTOSTERONE: 179

## 2016-01-22 ENCOUNTER — Telehealth: Payer: Self-pay | Admitting: Family Medicine

## 2016-01-22 ENCOUNTER — Encounter: Payer: Self-pay | Admitting: Family Medicine

## 2016-02-04 NOTE — Telephone Encounter (Signed)
Note opened in error.

## 2016-02-24 LAB — HM COLONOSCOPY

## 2016-02-26 ENCOUNTER — Encounter: Payer: Self-pay | Admitting: Family Medicine

## 2016-02-26 DIAGNOSIS — K5730 Diverticulosis of large intestine without perforation or abscess without bleeding: Secondary | ICD-10-CM | POA: Insufficient documentation

## 2016-03-05 ENCOUNTER — Encounter: Payer: Self-pay | Admitting: Family Medicine

## 2016-03-31 LAB — HEPATIC FUNCTION PANEL
ALK PHOS: 67 U/L (ref 25–125)
ALT: 27 U/L (ref 10–40)
AST: 25 U/L (ref 14–40)
BILIRUBIN, TOTAL: 0.4 mg/dL

## 2016-03-31 LAB — BASIC METABOLIC PANEL
BUN: 16 mg/dL (ref 4–21)
CREATININE: 0.8 mg/dL (ref 0.6–1.3)
Glucose: 100 mg/dL
Potassium: 4.4 mmol/L (ref 3.4–5.3)
Sodium: 141 mmol/L (ref 137–147)

## 2016-03-31 LAB — CBC AND DIFFERENTIAL
HEMATOCRIT: 41 % (ref 41–53)
Hemoglobin: 13.6 g/dL (ref 13.5–17.5)
PLATELETS: 190 10*3/uL (ref 150–399)
WBC: 5.3 10*3/mL

## 2016-03-31 LAB — HEMOGLOBIN A1C: HEMOGLOBIN A1C: 7.1

## 2016-03-31 LAB — TESTOSTERONE: TESTOSTERONE: 133

## 2016-04-01 ENCOUNTER — Ambulatory Visit (INDEPENDENT_AMBULATORY_CARE_PROVIDER_SITE_OTHER): Payer: 59 | Admitting: Family Medicine

## 2016-04-01 VITALS — BP 138/70 | HR 62 | Wt 284.0 lb

## 2016-04-01 DIAGNOSIS — L821 Other seborrheic keratosis: Secondary | ICD-10-CM | POA: Insufficient documentation

## 2016-04-01 DIAGNOSIS — Z794 Long term (current) use of insulin: Secondary | ICD-10-CM | POA: Diagnosis not present

## 2016-04-01 DIAGNOSIS — E118 Type 2 diabetes mellitus with unspecified complications: Secondary | ICD-10-CM | POA: Diagnosis not present

## 2016-04-01 DIAGNOSIS — F411 Generalized anxiety disorder: Secondary | ICD-10-CM

## 2016-04-01 MED ORDER — TRIAMCINOLONE ACETONIDE 0.1 % EX CREA: 1.0000 "application " | TOPICAL_CREAM | Freq: Two times a day (BID) | CUTANEOUS | 1 refills | Status: DC

## 2016-04-01 NOTE — Progress Notes (Signed)
Gregory Governor RooksA [name] is a 65 y.o. male who [facility] Medicine today for follow-up diabetes, anxiety/insomnia, and BPH.  Diabetes: Doing well with home fasting glucose in low 100s. A1C pending from earlier this week. Started exercise regimen 3 weeks ago: 30 min walking on treadmill 3x/week, stationary bike 2x/week. Reports chronic intermittent numbness of extremities and a crawling sensation in calves, worse in past 6 months.  Patient receives the majority of his diabetes care via endocrinology.  Anxiety/sleep: Trazodone has been helping sleep. Has not been using CPAP, but has f/u appt for this at the end of the month.  BPH: well-controlled with cialis and flomax.  Skin lesions: Has had multiple "moles" on his back since the 1980s that itch.   Past Medical History:  Diagnosis Date  . Arthritis   . Chest pain    a. Normal cors 2001. b. Neg stress test 2012, 05/2014.  . Diabetes mellitus without complication (HCC)   . Hyperlipidemia   . Hypertensive response to exercise   . Obesity   . Obstructive sleep apnea    Currently untreated    Past Surgical History:  Procedure Laterality Date  . CERVICAL FUSION    . HERNIA REPAIR    . KNEE ARTHROSCOPY  1984   both knee   Social History  Substance Use Topics  . Smoking status: Never Smoker  . Smokeless tobacco: Not on file  . Alcohol use No   family history includes Cancer in his father; Diabetes in his maternal grandfather, maternal grandmother, mother, and sister; Heart attack in his maternal grandfather, maternal grandmother, mother, paternal grandfather, and paternal grandmother; Heart disease in his father and mother; Hyperlipidemia in his sister; Hypertension in his maternal grandfather, mother, sister, and sister; Stroke in his maternal grandmother and mother.  ROS as above:  Medications: Current  Outpatient Prescriptions  Medication Sig Dispense Refill  . ANDROGEL PUMP 20.25 MG/ACT (1.62%) GEL     . aspirin 81 MG tablet Take 81 mg by mouth daily.    . canagliflozin (INVOKANA) 300 MG TABS tablet Take 1 tablet (300 mg total) by mouth daily before breakfast. 90 tablet 0  . Cholecalciferol (VITAMIN D3) 1000 units CAPS Take 6,000 Units by mouth daily.    . insulin glargine (LANTUS) 100 UNIT/ML injection Inject 30 Units into the skin 2 (two) times daily.     Marland Kitchen. LIVALO 2 MG TABS Take 1 tablet by mouth.     . losartan (COZAAR) 50 MG tablet Take 50 mg by mouth daily.     . metFORMIN (GLUCOPHAGE-XR) 500 MG 24 hr tablet Take 1,500 mg by mouth daily with breakfast.     . ONETOUCH VERIO test strip 1 each by Other route as needed.     . pioglitazone (ACTOS) 15 MG tablet Take 15 mg by mouth daily.     . tadalafil (CIALIS) 5 MG tablet Take 1 tablet (5 mg total) by mouth daily. For BPH 90 tablet 3  . tamsulosin (FLOMAX) 0.4 MG CAPS capsule Take 1 capsule (0.4 mg total) by mouth daily. 90 capsule 1  . traZODone (DESYREL) 50 MG tablet Take 0.5-1 tablets (25-50 mg total) by mouth at bedtime as needed for sleep. 30 tablet 3  . triamcinolone cream (KENALOG) 0.1 % Apply 1 application topically 2 (two) times daily. 453.6 g 1   No current facility-administered medications for this visit.    Allergies  Allergen Reactions  .  Statins     Muscle cramping all over body. Lipitor, Crestor, and pravastatin.  Verdis Prime. Victoza [Liraglutide] Other (See Comments)    Caused pancreatitis    Health Maintenance Health Maintenance  Topic Date Due  . Hepatitis C Screening  07/19/1950  . HIV Screening  11/24/1966  . ZOSTAVAX  11/24/2011  . OPHTHALMOLOGY EXAM  11/29/2015  . FOOT EXAM  02/02/2016  . HEMOGLOBIN A1C  07/02/2016  . TETANUS/TDAP  07/30/2025  . COLONOSCOPY  02/23/2026  . INFLUENZA VACCINE  Completed     Exam:  BP 138/70   Pulse 62   Wt 284 lb (128.8 kg)   BMI 40.75 kg/m  Gen: Well NAD HEENT: EOMI,   MMM Lungs: Normal work of breathing. CTABL Heart: RRR no MRG Abd: NABS, Soft. Nondistended, Nontender Exts: Brisk capillary refill, warm and well perfused. Hyperpigmentation over dorsal toes and medial ankles bilaterally; hypopigmented spots over lower tibial areas bilaterally. Skin: Hyperpigmented stuck on semicircular lesions across back. No erythema or tenderness.  Diabetic Foot Exam - Simple   Simple Foot Form Diabetic Foot exam was performed with the following findings:  Yes 04/01/2016  8:47 AM  Visual Inspection No deformities, no ulcerations, no other skin breakdown bilaterally:  Yes Sensation Testing Intact to touch and monofilament testing bilaterally:  Yes Pulse Check Posterior Tibialis and Dorsalis pulse intact bilaterally:  Yes Comments      No results found for this or any previous visit (from the past 72 hour(s)). No results found.    Assessment and Plan: 65 y.o. male with diabetes, anxiety, and seborrheic keratoses.  Diabetes: Normal foot exam today. Continue current regimen, f/u A1C. Co-managing with endocrinology, no changes today.  Anxiety/insomnia: Sleeping better with PRN trazodone, plan to continue. Discussed sleep hygiene.  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 However the diagnosis is somewhat uncertain. This patient is symptomatic will treat with 0.1% Triamcinolone cream.  Follow up in 6 months.   No orders of the defined types were placed in this encounter.   Discussed warning signs or symptoms. Please see discharge instructions. Patient expresses understanding.

## 2016-04-01 NOTE — Patient Instructions (Signed)
Thank you for coming in today. Try to get records from the [facility].   Call or go to the emergency room if you get worse, have trouble breathing, have chest pains, or palpitations.   Use the cream 1 or 2x daily for itching as needed.   Seborrheic Keratosis Seborrheic keratosis is a common, noncancerous (benign) skin growth. This condition causes waxy, rough, tan, brown, or black spots to appear on the skin. These skin growths can be flat or raised. CAUSES The cause of this condition is not known. RISK FACTORS This condition is more likely to develop in:  People who have a family history of seborrheic keratosis.  People who are 7750 or older.  People who are pregnant.  People who have had estrogen replacement therapy. SYMPTOMS This condition often occurs on the face, chest, shoulders, back, or other areas. These growths:  Are usually painless, but may become irritated and itchy.  Can be yellow, brown, black, or other colors.  Are slightly raised or have a flat surface.  Are sometimes rough or wart-like in texture.  Are often waxy on the surface.  Are round or oval-shaped.  Sometimes look like they are "stuck on."  Often occur in groups, but may occur as a single growth. DIAGNOSIS This condition is diagnosed with a medical history and physical exam. A sample of the growth may be tested (skin biopsy). You may need to see a skin specialist (dermatologist). TREATMENT Treatment is not usually needed for this condition, unless the growths are irritated or are often bleeding. You may also choose to have the growths removed if you do not like their appearance. Most commonly, these growths are treated with a procedure in which liquid nitrogen is applied to "freeze" off the growth (cryosurgery). They may also be burned off with electricity or cut off. HOME CARE INSTRUCTIONS  Watch your growth for any changes.  Keep all follow-up visits as told by your health care  provider. This is important.  Do not scratch or pick at the growth or growths. This can cause them to become irritated or infected. SEEK MEDICAL CARE IF:  You suddenly have many new growths.  Your growth bleeds, itches, or hurts.  Your growth suddenly becomes larger or changes color.   This information is not intended to replace advice given to you by your health care provider. Make sure you discuss any questions you have with your health care provider.   Document Released: 06/19/2010 Document Revised: 02/05/2015 Document Reviewed: 10/02/2014 Elsevier Interactive Patient Education [employer]2016 Elsevier Inc.

## 2016-04-01 NOTE — Progress Notes (Signed)
Mo,[place]

## 2016-04-15 ENCOUNTER — Encounter: Payer: Self-pay | Admitting: Family Medicine

## 2016-04-21 ENCOUNTER — Ambulatory Visit (INDEPENDENT_AMBULATORY_CARE_PROVIDER_SITE_OTHER): Payer: 59 | Admitting: Family Medicine

## 2016-04-21 VITALS — BP 125/82 | HR 80 | Wt 283.0 lb

## 2016-04-21 DIAGNOSIS — J209 Acute bronchitis, unspecified: Secondary | ICD-10-CM | POA: Diagnosis not present

## 2016-04-21 MED ORDER — IPRATROPIUM BROMIDE 0.06 % NA SOLN: 2.0000 | NASAL | 6 refills | Status: DC | PRN

## 2016-04-21 MED ORDER — GUAIFENESIN-CODEINE 100-10 MG/5ML PO SOLN: 5.0000 mL | Freq: Every evening | ORAL | 0 refills | Status: DC | PRN

## 2016-04-21 MED ORDER — BENZONATATE 200 MG PO CAPS: 200.0000 mg | ORAL_CAPSULE | Freq: Three times a day (TID) | ORAL | 0 refills | Status: DC | PRN

## 2016-04-21 NOTE — Progress Notes (Signed)
[facility] Governor RooksA Capwell [name] is a 65 y.o. male who presents to [facility] today for cough congestion sore throat and runny nose. Symptoms present for one day. Wife is sick with similar illness. No chest pain palpitations or severe shortness of breath. Patient is using aspirin and Mucinex which have helped a bit. Cough is very bothersome and interferes with sleep.   Past Medical History:  Diagnosis Date  . Arthritis   . Chest pain    a. Normal cors 2001. b. Neg stress test 2012, 05/2014.  . Diabetes mellitus without complication (HCC)   . Hyperlipidemia   . Hypertensive response to exercise   . Obesity   . Obstructive sleep apnea    Currently untreated    Past Surgical History:  Procedure Laterality Date  . CERVICAL FUSION    . HERNIA REPAIR    . KNEE ARTHROSCOPY  1984   both knee   Social History  Substance Use Topics  . Smoking status: Never Smoker  . Smokeless tobacco: Not on file  . Alcohol use No   family history includes Cancer in his father; Diabetes in his maternal grandfather, maternal grandmother, mother, and sister; Heart attack in his maternal grandfather, maternal grandmother, mother, paternal grandfather, and paternal grandmother; Heart disease in his father and mother; Hyperlipidemia in his sister; Hypertension in his maternal grandfather, mother, sister, and sister; Stroke in his maternal grandmother and mother.  ROS as above:  Medications: Current Outpatient Prescriptions  Medication Sig Dispense Refill  . ANDROGEL PUMP 20.25 MG/ACT (1.62%) GEL     . aspirin 81 MG tablet Take 81 mg by mouth daily.    . benzonatate (TESSALON) 200 MG capsule Take 1 capsule (200 mg total) by mouth 3 (three) times daily as needed for cough. 45 capsule 0  . canagliflozin (INVOKANA) 300 MG TABS tablet Take 1 tablet (300 mg total) by mouth daily before breakfast. 90  tablet 0  . Cholecalciferol (VITAMIN D3) 1000 units CAPS Take 6,000 Units by mouth daily.    Marland Kitchen. guaiFENesin-codeine 100-10 MG/5ML syrup Take 5 mLs by mouth at bedtime as needed for cough. 120 mL 0  . insulin glargine (LANTUS) 100 UNIT/ML injection Inject 30 Units into the skin 2 (two) times daily.     Marland Kitchen. ipratropium (ATROVENT) 0.06 % nasal spray Place 2 sprays into both nostrils every 4 (four) hours as needed for rhinitis. 10 mL 6  . LIVALO 2 MG TABS Take 1 tablet by mouth.     . losartan (COZAAR) 50 MG tablet Take 50 mg by mouth daily.     . metFORMIN (GLUCOPHAGE-XR) 500 MG 24 hr tablet Take 1,500 mg by mouth daily with breakfast.     . ONETOUCH VERIO test strip 1 each by Other route as needed.     . pioglitazone (ACTOS) 15 MG tablet Take 15 mg by mouth daily.     . tadalafil (CIALIS) 5 MG tablet Take 1 tablet (5 mg total) by mouth daily. For BPH 90 tablet 3  . tamsulosin (FLOMAX) 0.4 MG CAPS capsule Take 1 capsule (0.4 mg total) by mouth daily. 90 capsule 1  . traZODone (DESYREL) 50 MG tablet Take 0.5-1 tablets (25-50 mg total) by mouth at bedtime as needed for sleep. 30 tablet 3  . triamcinolone cream (KENALOG) 0.1 % Apply 1 application topically 2 (two) times daily. 453.6 g 1   No current facility-administered medications for this visit.  Allergies  Allergen Reactions  . Statins     Muscle cramping all over body. Lipitor, Crestor, and pravastatin.  Verdis Prime. Victoza [Liraglutide] Other (See Comments)    Caused pancreatitis    Health Maintenance Health Maintenance  Topic Date Due  . Hepatitis C Screening  04-Sep-1950  . HIV Screening  11/24/1966  . ZOSTAVAX  11/24/2011  . OPHTHALMOLOGY EXAM  11/29/2015  . HEMOGLOBIN A1C  09/28/2016  . FOOT EXAM  04/01/2017  . TETANUS/TDAP  07/30/2025  . COLONOSCOPY  02/23/2026  . INFLUENZA VACCINE  Completed     Exam:  BP 125/82   Pulse 80   Wt 283 lb (128.4 kg)   BMI 40.61 kg/m  Gen: Well NAD Nontoxic appearing HEENT: EOMI,  MMM or nasal  discharge. Normal posterior pharynx and tympanic membranes. Lungs: Normal work of breathing. CTABL Heart: RRR no MRG Abd: NABS, Soft. Nondistended, Nontender Exts: Brisk capillary refill, warm and well perfused.    No results found for this or any previous visit (from the past 72 hour(s)). No results found.    Assessment and Plan: 65 y.o. male with viral bronchitis. Symptomatic management with Tessalon Perles codeine cough syrup and Atrovent nasal spray. Return as needed.   No orders of the defined types were placed in this encounter.   Discussed warning signs or symptoms. Please see discharge instructions. Patient expresses understanding.

## 2016-04-21 NOTE — Patient Instructions (Signed)
Thank you for coming in today. Call or go to the emergency room if you get worse, have trouble breathing, have chest pains, or palpitations.   Acute Bronchitis, Adult Acute bronchitis is sudden (acute) swelling of the air tubes (bronchi) in the lungs. Acute bronchitis causes these tubes to fill with mucus, which can make it hard to breathe. It can also cause coughing or wheezing. In adults, acute bronchitis usually goes away within 2 weeks. A cough caused by bronchitis may last up to 3 weeks. Smoking, allergies, and asthma can make the condition worse. Repeated episodes of bronchitis may cause further lung problems, such as chronic obstructive pulmonary disease (COPD). What are the causes? This condition can be caused by germs and by substances that irritate the lungs, including:  Cold and flu viruses. This condition is most often caused by the same virus that causes a cold.  Bacteria.  Exposure to tobacco smoke, dust, fumes, and air pollution. What increases the risk? This condition is more likely to develop in people who:  Have close contact with someone with acute bronchitis.  Are exposed to lung irritants, such as tobacco smoke, dust, fumes, and vapors.  Have a weak immune system.  Have a respiratory condition such as asthma. What are the signs or symptoms? Symptoms of this condition include:  A cough.  Coughing up clear, yellow, or green mucus.  Wheezing.  Chest congestion.  Shortness of breath.  A fever.  Body aches.  Chills.  A sore throat. How is this diagnosed? This condition is usually diagnosed with a physical exam. During the exam, your health care provider may order tests, such as chest X-rays, to rule out other conditions. He or she may also:  Test a sample of your mucus for bacterial infection.  Check the level of oxygen in your blood. This is done to check for pneumonia.  Do a chest X-ray or lung function testing to rule out pneumonia and other  conditions.  Perform blood tests. Your health care provider will also ask about your symptoms and medical history. How is this treated? Most cases of acute bronchitis clear up over time without treatment. Your health care provider may recommend:  Drinking more fluids. Drinking more makes your mucus thinner, which may make it easier to breathe.  Taking a medicine for a fever or cough.  Taking an antibiotic medicine.  Using an inhaler to help improve shortness of breath and to control a cough.  Using a cool mist vaporizer or humidifier to make it easier to breathe. Follow these instructions at home: Medicines  Take over-the-counter and prescription medicines only as told by your health care provider.  If you were prescribed an antibiotic, take it as told by your health care provider. Do not stop taking the antibiotic even if you start to feel better. General instructions  Get plenty of rest.  Drink enough fluids to keep your urine clear or pale yellow.  Avoid smoking and secondhand smoke. Exposure to cigarette smoke or irritating chemicals will make bronchitis worse. If you smoke and you need help quitting, ask your health care provider. Quitting smoking will help your lungs heal faster.  Use an inhaler, cool mist vaporizer, or humidifier as told by your health care provider.  Keep all follow-up visits as told by your health care provider. This is important. How is this prevented? To lower your risk of getting this condition again:  Wash your hands often with soap and water. If soap and water are not  available, use hand sanitizer.  Avoid contact with people who have cold symptoms.  Try not to touch your hands to your mouth, nose, or eyes.  Make sure to get the flu shot every year. Contact a health care provider if:  Your symptoms do not improve in 2 weeks of treatment. Get help right away if:  You cough up blood.  You have chest pain.  You have severe shortness of  breath.  You become dehydrated.  You faint or keep feeling like you are going to faint.  You keep vomiting.  You have a severe headache.  Your fever or chills gets worse. This information is not intended to replace advice given to you by your health care provider. Make sure you discuss any questions you have with your health care provider. Document Released: 06/24/2004 Document Revised: 12/10/2015 Document Reviewed: 11/05/2015 Elsevier Interactive Patient Education  2017 [employer]

## 2016-07-06 LAB — BASIC METABOLIC PANEL
BUN: 15 mg/dL (ref 4–21)
Creatinine: 0.9 mg/dL (ref <=1.3)
GLUCOSE: 136 mg/dL
Potassium: 4.4 mmol/L (ref 3.4–5.3)
Sodium: 141 mmol/L (ref 137–147)

## 2016-07-06 LAB — CBC AND DIFFERENTIAL
HEMATOCRIT: 46 % (ref 41–53)
HEMOGLOBIN: 14.8 g/dL (ref 13.5–17.5)
PLATELETS: 165 10*3/uL (ref 150–399)
WBC: 6.9 10*3/mL

## 2016-07-06 LAB — LIPID PANEL
Cholesterol: 170 mg/dL (ref 0–200)
HDL: 8 mg/dL — AB (ref 35–70)
LDL CALC: 105 mg/dL
Triglycerides: 113 mg/dL (ref 40–160)

## 2016-07-06 LAB — HEPATIC FUNCTION PANEL
ALK PHOS: 66 U/L (ref 25–125)
ALT: 19 U/L (ref 10–40)
AST: 14 U/L (ref 14–40)

## 2016-07-13 ENCOUNTER — Encounter: Payer: Self-pay | Admitting: Family Medicine

## 2016-07-23 ENCOUNTER — Ambulatory Visit (INDEPENDENT_AMBULATORY_CARE_PROVIDER_SITE_OTHER): Payer: 59 | Admitting: Family Medicine

## 2016-07-23 VITALS — BP 131/66 | HR 75 | Wt 283.0 lb

## 2016-07-23 DIAGNOSIS — S39012A Strain of muscle, fascia and tendon of lower back, initial encounter: Secondary | ICD-10-CM

## 2016-07-23 NOTE — Patient Instructions (Signed)
Thank you for coming in today. This should improve on its own in a few weeks.  Let me know if you want to do PT.  I recommend a heating pad and a TENS unit.  Continue ibuprofen or aleve and flexeril as needed.   Come back or go to the emergency room if you notice new weakness new numbness problems walking or bowel or bladder problems.  TENS UNIT: This is helpful for muscle pain and spasm.   Search and Purchase a TENS 7000 2nd edition at  www.tenspros.com or www.Amazon.com It should be less than $30.     TENS unit instructions: Do not shower or bathe with the unit on Turn the unit off before removing electrodes or batteries If the electrodes lose stickiness add a drop of water to the electrodes after they are disconnected from the unit and place on plastic sheet. If you continued to have difficulty, call the TENS unit company to purchase more electrodes. Do not apply lotion on the skin area prior to use. Make sure the skin is clean and dry as this will help prolong the life of the electrodes. After use, always check skin for unusual red areas, rash or other skin difficulties. If there are any skin problems, does not apply electrodes to the same area. Never remove the electrodes from the unit by pulling the wires. Do not use the TENS unit or electrodes other than as directed. Do not change electrode placement without consultating your therapist or physician. Keep 2 fingers with between each electrode. Wear time ratio is 2:1, on to off times.    For example on for 30 minutes off for 15 minutes and then on for 30 minutes off for 15 minutes     Lumbosacral Strain Lumbosacral strain is an injury that causes pain in the lower back (lumbosacral spine). This injury usually occurs from overstretching the muscles or ligaments along your spine. A strain can affect one or more muscles or cord-like tissues that connect bones to other bones (ligaments). What are the causes? This condition may be  caused by:  A hard, direct hit (blow) to the back.  Excessive stretching of the lower back muscles. This may result from:  A fall.  Lifting something heavy.  Repetitive movements such as bending or crouching. What increases the risk? The following factors may increase your risk of getting this condition:  Participating in sports or activities that involve:  A sudden twist of the back.  Pushing or pulling motions.  Being overweight or obese.  Having poor strength and flexibility, especially tight hamstrings or weak muscles in the back or abdomen.  Having too much of a curve in the lower back.  Having a pelvis that is tilted forward. What are the signs or symptoms? The main symptom of this condition is pain in the lower back, at the site of the strain. Pain may extend (radiate) down one or both legs. How is this diagnosed? This condition is diagnosed based on:  Your symptoms.  Your medical history.  A physical exam.  Your health care provider may push on certain areas of your back to determine the source of your pain.  You may be asked to bend forward, backward, and side to side to assess the severity of your pain and your range of motion.  Imaging tests, such as:  X-rays.  MRI. How is this treated? Treatment for this condition may include:  Putting heat and cold on the affected area.  Medicines to  help relieve pain and relax your muscles (muscle relaxants).  NSAIDs to help reduce swelling and discomfort. When your symptoms improve, it is important to gradually return to your normal routine as soon as possible to reduce pain, avoid stiffness, and avoid loss of muscle strength. Generally, symptoms should improve within 6 weeks of treatment. However, recovery time varies. Follow these instructions at home: Managing pain, stiffness, and swelling  If directed, put ice on the injured area during the first 24 hours after your strain.  Put ice in a plastic  bag.  Place a towel between your skin and the bag.  Leave the ice on for 20 minutes, 2-3 times a day.  If directed, put heat on the affected area as often as told by your health care provider. Use the heat source that your health care provider recommends, such as a moist heat pack or a heating pad.  Place a towel between your skin and the heat source.  Leave the heat on for 20-30 minutes.  Remove the heat if your skin turns bright red. This is especially important if you are unable to feel pain, heat, or cold. You may have a greater risk of getting burned. Activity  Rest and return to your normal activities as told by your health care provider. Ask your health care provider what activities are safe for you.  Avoid activities that take a lot of energy for as long as told by your health care provider. General instructions  Take over-the-counter and prescription medicines only as told by your health care provider.  Donot drive or use heavy machinery while taking prescription pain medicine.  Do not use any products that contain nicotine or tobacco, such as cigarettes and e-cigarettes. If you need help quitting, ask your health care provider.  Keep all follow-up visits as told by your health care provider. This is important. How is this prevented?  Use correct form when playing sports and lifting heavy objects.  Use good posture when sitting and standing.  Maintain a healthy weight.  Sleep on a mattress with medium firmness to support your back.  Be safe and responsible while being active to avoid falls.  Do at least 150 minutes of moderate-intensity exercise each week, such as brisk walking or water aerobics. Try a form of exercise that takes stress off your back, such as swimming or stationary cycling.  Maintain physical fitness, including:  Strength.  Flexibility.  Cardiovascular fitness.  Endurance. Contact a health care provider if:  Your back pain does not  improve after 6 weeks of treatment.  Your symptoms get worse. Get help right away if:  Your back pain is severe.  You cannot stand or walk.  You have difficulty controlling when you urinate or when you have a bowel movement.  You feel nauseous or you vomit.  Your feet get very cold.  You have numbness, tingling, weakness, or problems using your arms or legs.  You develop any of the following:  Shortness of breath.  Dizziness.  Pain in your legs.  Weakness in your buttocks or legs.  Discoloration of the skin on your toes or legs. This information is not intended to replace advice given to you by your health care provider. Make sure you discuss any questions you have with your health care provider. Document Released: 02/24/2005 Document Revised: 12/05/2015 Document Reviewed: 10/19/2015 Elsevier Interactive Patient Education  2017 [employer]
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
## 2016-08-17 ENCOUNTER — Ambulatory Visit (INDEPENDENT_AMBULATORY_CARE_PROVIDER_SITE_OTHER): Payer: BLUE CROSS/BLUE SHIELD | Admitting: Family Medicine

## 2016-08-17 ENCOUNTER — Ambulatory Visit (INDEPENDENT_AMBULATORY_CARE_PROVIDER_SITE_OTHER): Payer: BLUE CROSS/BLUE SHIELD

## 2016-08-17 ENCOUNTER — Encounter: Payer: Self-pay | Admitting: Family Medicine

## 2016-08-17 VITALS — BP 115/80 | HR 86 | Wt 284.0 lb

## 2016-08-17 DIAGNOSIS — M545 Low back pain: Secondary | ICD-10-CM | POA: Diagnosis not present

## 2016-08-17 DIAGNOSIS — S39012A Strain of muscle, fascia and tendon of lower back, initial encounter: Secondary | ICD-10-CM

## 2016-08-17 DIAGNOSIS — S56912A Strain of unspecified muscles, fascia and tendons at forearm level, left arm, initial encounter: Secondary | ICD-10-CM

## 2016-08-17 MED ORDER — DICLOFENAC SODIUM 1 % TD GEL: 4.0000 g | Freq: Four times a day (QID) | TRANSDERMAL | 11 refills | Status: DC
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
## 2016-08-17 NOTE — Addendum Note (Signed)
Addended by: Minna AntisBRIGHAM, Robynn Marcel T on: 08/17/2016 03:59 PM   Modules accepted: Orders

## 2016-08-17 NOTE — Patient Instructions (Addendum)
Thank you for coming in today. Get xray lumbar spine today.  Apply voltaren gel for pain as needed 4x daily.  Attend PT.  Recheck in 4-6 weeks.   Wrist exercises.  Work on stretching elbow extended and hand extended Work on [employer] Communications[employer]owing the tendon to lengthen and resist lengthening at the same time. Eccentrics for both the forearm and fingers.

## 2016-08-25 ENCOUNTER — Encounter: Payer: Self-pay | Admitting: Rehabilitative and Restorative Service Providers"

## 2016-08-25 ENCOUNTER — Ambulatory Visit (INDEPENDENT_AMBULATORY_CARE_PROVIDER_SITE_OTHER): Payer: BLUE CROSS/BLUE SHIELD | Admitting: Rehabilitative and Restorative Service Providers"

## 2016-08-25 DIAGNOSIS — M545 Low back pain, unspecified: Secondary | ICD-10-CM

## 2016-08-25 DIAGNOSIS — R29898 Other symptoms and signs involving the musculoskeletal system: Secondary | ICD-10-CM | POA: Diagnosis not present

## 2016-08-25 DIAGNOSIS — M546 Pain in thoracic spine: Secondary | ICD-10-CM

## 2016-08-25 NOTE — Patient Instructions (Addendum)
Quads / HF, Prone   Lie face down. Grasp one ankle with same-side hand. Use towel if needed to reach. Gently pull foot toward buttock.  Hold 30 seconds. Repeat 3 times per session. Do 2-3 sessions per day. Supine Knee-to-Chest, Unilateral    Lie on back, hands clasped behind one knee. Pull knee in toward chest until a comfortable stretch is felt in lower back and buttocks. Hold __20-30_ seconds.  Repeat _5-10__ times per session. Do _2__ sessions per day.    Supine Knee-to-Chest, Bilateral    Lie on back, hands clasped behind both knees. Pull knees in toward chest until a comfortable stretch is felt in lower back and buttocks. Hold _20-30__ seconds. Repeat __5-10_ times per session. Do _2__ sessions per day.   SUPINE Tips A   Can place hands behind head if comfortable  Being in the supine position means to be lying on the back. Lying on the back is the position of least compression on the bones and discs of the spine, and helps to re-align the natural curves of the back. Take deep breaths in - in to count of 4 and out to count of 6-8 Repeat 5-10 times 2-3 times a day    TENS UNIT: This is helpful for muscle pain and spasm.   Search and Purchase a TENS 7000 2nd edition at www.tenspros.com. It should be less than $30.     TENS unit instructions: Do not shower or bathe with the unit on Turn the unit off before removing electrodes or batteries If the electrodes lose stickiness add a drop of water to the electrodes after they are disconnected from the unit and place on plastic sheet. If you continued to have difficulty, call the TENS unit company to purchase more electrodes. Do not apply lotion on the skin area prior to use. Make sure the skin is clean and dry as this will help prolong the life of the electrodes. After use, always check skin for unusual red areas, rash or other skin difficulties. If there are any skin problems, does not apply electrodes to the same  area. Never remove the electrodes from the unit by pulling the wires. Do not use the TENS unit or electrodes other than as directed. Do not change electrode placement without consultating your therapist or physician. Keep 2 fingers with between each electrode.   Trigger Point Dry Needling  . What is Trigger Point Dry Needling (DN)? o DN is a physical therapy technique used to treat muscle pain and dysfunction. Specifically, DN helps deactivate muscle trigger points (muscle knots).  o A thin filiform needle is used to penetrate the skin and stimulate the underlying trigger point. The goal is for a local twitch response (LTR) to occur and for the trigger point to relax. No medication of any kind is injected during the procedure.   . What Does Trigger Point Dry Needling Feel Like?  o The procedure feels different for each individual patient. Some patients report that they do not actually feel the needle enter the skin and overall the process is not painful. Very mild bleeding may occur. However, many patients feel a deep cramping in the muscle in which the needle was inserted. This is the local twitch response.   Marland Kitchen. How Will I feel after the treatment? o Soreness is normal, and the onset of soreness may not occur for a few hours. Typically this soreness does not last longer than two days.  o Bruising is uncommon, however; ice  can be used to decrease any possible bruising.  o In rare cases feeling tired or nauseous after the treatment is normal. In addition, your symptoms may get worse before they get better, this period will typically not last longer than 24 hours.   . What Can I do After My Treatment? o Increase your hydration by drinking more water for the next 24 hours. o You may place ice or heat on the areas treated that have become sore, however, do not use heat on inflamed or bruised areas. Heat often brings more relief post needling. o You can continue your regular activities, but  vigorous activity is not recommended initially after the treatment for 24 hours. o DN is best combined with other physical therapy such as strengthening, stretching, and other therapies.
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
## 2016-09-02 ENCOUNTER — Ambulatory Visit (INDEPENDENT_AMBULATORY_CARE_PROVIDER_SITE_OTHER): Payer: BLUE CROSS/BLUE SHIELD | Admitting: Rehabilitative and Restorative Service Providers"

## 2016-09-02 ENCOUNTER — Encounter: Payer: Self-pay | Admitting: Rehabilitative and Restorative Service Providers"

## 2016-09-02 DIAGNOSIS — M545 Low back pain, unspecified: Secondary | ICD-10-CM

## 2016-09-02 DIAGNOSIS — M546 Pain in thoracic spine: Secondary | ICD-10-CM | POA: Diagnosis not present

## 2016-09-02 DIAGNOSIS — R29898 Other symptoms and signs involving the musculoskeletal system: Secondary | ICD-10-CM

## 2016-09-02 NOTE — Patient Instructions (Addendum)
Scapula Adduction With Pectoralis Stretch: Low - Standing   Shoulders at 45 hands even with shoulders, keeping weight through legs, shift weight forward until you feel pull or stretch through the front of your chest. Hold _30__ seconds. Do _3__ times, _2-4__ times per day.   Scapula Adduction With Pectoralis Stretch: Mid-Range - Standing   Shoulders at 90 elbows even with shoulders, keeping weight through legs, shift weight forward until you feel pull or strength through the front of your chest. Hold __30_ seconds. Do _3__ times, __2-4_ times per day.   Scapula Adduction With Pectoralis Stretch: High - Standing   Shoulders at 120 hands up high on the doorway, keeping weight on feet, shift weight forward until you feel pull or stretch through the front of your chest. Hold _30__ seconds. Do _3__ times, _2-3__ times per day.  Shoulder Blade Squeeze    Rotate shoulders back, then squeeze shoulder blades down and back squeezing around a swim noodle or door facingr. Hold 10 sec Repeat __10__ times. Do __1-2__ sessions per day.    Scapular Retraction: Elbow Flexion (Standing)    With elbows bent to 90, pinch shoulder blades together and rotate arms out, keeping elbows bent. Repeat _10___ times per set. Do __1-2__ sets per session. Do __2-3 __ sessions per day.    Trigger Point Dry Needling  . What is Trigger Point Dry Needling (DN)? o DN is a physical therapy technique used to treat muscle pain and dysfunction. Specifically, DN helps deactivate muscle trigger points (muscle knots).  o A thin filiform needle is used to penetrate the skin and stimulate the underlying trigger point. The goal is for a local twitch response (LTR) to occur and for the trigger point to relax. No medication of any kind is injected during the procedure.   . What Does Trigger Point Dry Needling Feel Like?  o The procedure feels different for each individual patient. Some patients report that they do  not actually feel the needle enter the skin and overall the process is not painful. Very mild bleeding may occur. However, many patients feel a deep cramping in the muscle in which the needle was inserted. This is the local twitch response.   Marland Kitchen How Will I feel after the treatment? o Soreness is normal, and the onset of soreness may not occur for a few hours. Typically this soreness does not last longer than two days.  o Bruising is uncommon, however; ice can be used to decrease any possible bruising.  o In rare cases feeling tired or nauseous after the treatment is normal. In addition, your symptoms may get worse before they get better, this period will typically not last longer than 24 hours.   . What Can I do After My Treatment? o Increase your hydration by drinking more water for the next 24 hours. o You may place ice or heat on the areas treated that have become sore, however, do not use heat on inflamed or bruised areas. Heat often brings more relief post needling. o You can continue your regular activities, but vigorous activity is not recommended initially after the treatment for 24 hours. o DN is best combined with other physical therapy such as strengthening, stretching, and other therapies.

## 2016-09-02 NOTE — Therapy (Signed)
[facility] [place] [address] [place], [place], [address] Phone: [phone]   Fax:  [phone]  Physical Therapy Treatment  Patient Details  Name: Gregory GREENSTREET [name] MRN: [id number] Date of Birth: 1950-12-08 Referring Provider: Dr Clementeen Graham   Encounter Date: 09/02/2016      PT End of Session - 09/02/16 1659    Visit Number 2   Number of Visits 12   Date for PT Re-Evaluation 10/06/16   PT Start Time 1659   PT Stop Time 1754   PT Time Calculation (min) 55 min   Activity Tolerance Patient tolerated treatment well      Past Medical History:  Diagnosis Date  . Arthritis   . Chest pain    a. Normal cors 2001. b. Neg stress test 2012, 05/2014.  . Diabetes mellitus without complication (HCC)   . Hyperlipidemia   . Hypertensive response to exercise   . Obesity   . Obstructive sleep apnea    Currently untreated     Past Surgical History:  Procedure Laterality Date  . CERVICAL FUSION    . HERNIA REPAIR    . KNEE ARTHROSCOPY  1984   both knee    There were no vitals filed for this visit.      Subjective Assessment - 09/02/16 1659    Subjective Gregory West reports that the pain has continued but the pain is not 5-6/10 instead of 8-9/10 level. Had to drive 9 hours to attend a funeral. Had some increased pain with that much driving. Did a few of his exercises. Still having pain in the Lt forearm and the Rt shoulder.    Currently in Pain? Yes   Pain Score 7    Pain Location Back   Pain Orientation Lower;Right;Left   Pain Descriptors / Indicators Nagging;Spasm   Pain Type Acute pain   Pain Onset 1 to 4 weeks ago   Pain Frequency Constant  variable intensity                          [facility] Adult PT Treatment/Exercise - 09/02/16 0001      Lumbar Exercises: Stretches   Single Knee to Chest Stretch 5 reps;20 seconds  bilat - supine with strap    Double Knee to Chest Stretch 5 reps;30 seconds  supine  with strap    Lower Trunk Rotation 5 reps  5-10 sec      Lumbar Exercises: Aerobic   UBE (Upper Arm Bike) L2 x 4 min fwd/back      Lumbar Exercises: Standing   Scapular Retraction --  scap squeeze w/ noodle 10 sec x 10    Scapular Retraction Limitations active sca retraction - W's x 10      Shoulder Exercises: Stretch   Other Shoulder Stretches 3 way doorway stretch 30 sec x 2      Moist Heat Therapy   Number Minutes Moist Heat 15 Minutes   Moist Heat Location Lumbar Spine  thoracic      Electrical Stimulation   Electrical Stimulation Location bilat thoracic and lumbar paraspinals    Electrical Stimulation Action IFC   Electrical Stimulation Parameters to tolerance   Electrical Stimulation Goals Pain;Tone  tightness      Manual Therapy   Manual therapy comments pt prone   Soft tissue mobilization thoracolumbar paraspinals bilaterally   Myofascial Release lower thoracic paraspinals bilaterally           Trigger Point  Dry Needling - 09/02/16 1737    Consent Given? Yes   Education Handout Provided Yes   Muscles Treated Upper Body --  thoracic paraspinals bilat-decresaed tightness to palpation              PT Education - 09/02/16 1718    Education provided Yes   Education Details HEP; DN   Person(s) Educated Patient   Methods Explanation;Demonstration;Tactile cues;Verbal cues;Handout   Comprehension Verbalized understanding;Returned demonstration;Verbal cues required;Tactile cues required             PT Long Term Goals - 09/02/16 1658      PT LONG TERM GOAL #1   Title Improve trunk mobilty in all planes by 15-20% to allow improved motion with decreased pain 10/04/16   Time 6   Period Weeks   Status On-going     PT LONG TERM GOAL #2   Title Patient to report ability to breath deeply without pain in thoracic spine 10/06/16   Time 6   Period Weeks   Status On-going     PT LONG TERM GOAL #3   Title Patient reports return to normal functional  acitivites with no more than 2/10 pain 10/06/16   Time 6   Period Weeks   Status On-going     PT LONG TERM GOAL #4   Title Independent in HEP 10/06/16   Time 6   Period Weeks   Status On-going     PT LONG TERM GOAL #5   Title Improve FOTO </= 36% limitation 10/06/16   Time 6   Period Weeks   Status On-going               Plan - 09/02/16 1739    Clinical Impression Statement Gregory West reports continued pain but some decrease in intensity. He has not been able to do exercises consistently due to being out of town for a funeral. Tolerated exercise and DN well. Muscular tightness noted through thoracolumbar paraspinals. Will benefit from continued PT to address deficits.    Rehab Potential Good   PT Frequency 2x / week   PT Duration 6 weeks   PT Treatment/Interventions Patient/family education;ADLs/Self Care Home Management;Neuromuscular re-education;Cryotherapy;Electrical Stimulation;Iontophoresis /ml Dexamethasone;Moist Heat;Traction;Ultrasound;Dry needling;Manual techniques;Therapeutic activities;Therapeutic exercise   PT Next Visit Plan stretch for thoraco-lumbar paraspinals; assess response to DN for thoracic musculature; education in body mechanics; modalities as indicated    Consulted and Agree with Plan of Care Patient      Patient will benefit from skilled therapeutic intervention in order to improve the following deficits and impairments:  Postural dysfunction, Improper body mechanics, Pain, Decreased range of motion, Decreased mobility, Increased fascial restricitons, Increased muscle spasms, Decreased activity tolerance  Visit Diagnosis: Pain in thoracic spine  Acute bilateral low back pain without sciatica  Other symptoms and signs involving the musculoskeletal system     Problem List Patient Active Problem List   Diagnosis Date Noted  . Seborrheic keratoses 04/01/2016  . Diverticulosis of colon without hemorrhage 02/26/2016  . Anxiety state 10/02/2015  . BPH  (benign prostatic hyperplasia) 07/31/2015  . Carpal tunnel syndrome 06/04/2015  . 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 Vitamin D deficiency 03/06/2015  . History of pancreatitis 03/06/2015  . Chronic back pain 03/04/2015  . Type II diabetes mellitus with complication (HCC) 05/15/2014  . Obstructive sleep apnea 05/15/2014  . Hyperlipidemia 05/15/2014  . Glaucoma suspect 10/21/2011    Celyn Rober Minion PT, MPH  09/02/2016, 5:55 PM  [facility] [id number]  [place] [address] [place], [place], [address] Phone: [phone]   Fax:  [phone]  Name: Gregory CAIN [name] MRN: [id number] Date of Birth: Sep 05, 1950

## 2016-09-06 ENCOUNTER — Ambulatory Visit (INDEPENDENT_AMBULATORY_CARE_PROVIDER_SITE_OTHER): Payer: BLUE CROSS/BLUE SHIELD | Admitting: Rehabilitative and Restorative Service Providers"

## 2016-09-06 ENCOUNTER — Encounter: Payer: Self-pay | Admitting: Rehabilitative and Restorative Service Providers"

## 2016-09-06 DIAGNOSIS — M545 Low back pain, unspecified: Secondary | ICD-10-CM

## 2016-09-06 DIAGNOSIS — M546 Pain in thoracic spine: Secondary | ICD-10-CM

## 2016-09-06 DIAGNOSIS — R29898 Other symptoms and signs involving the musculoskeletal system: Secondary | ICD-10-CM

## 2016-09-06 NOTE — Therapy (Signed)
[facility] [place] [address] [place] [address] [place], [place], [address] Phone: [phone]   Fax:  [phone]  Physical Therapy Treatment  Patient Details  Name: Gregory SANDEN [name] MRN: [id number] Date of Birth: Jan 19, 1951 Referring Provider: Dr Clementeen Graham   Encounter Date: 09/06/2016      PT End of Session - 09/06/16 1605    Visit Number 3   Number of Visits 12   Date for PT Re-Evaluation 10/06/16   PT Start Time 1603   PT Stop Time 1658   PT Time Calculation (min) 55 min   Activity Tolerance Patient tolerated treatment well      Past Medical History:  Diagnosis Date  . Arthritis   . Chest pain    a. Normal cors 2001. b. Neg stress test 2012, 05/2014.  . Diabetes mellitus without complication (HCC)   . Hyperlipidemia   . Hypertensive response to exercise   . Obesity   . Obstructive sleep apnea    Currently untreated     Past Surgical History:  Procedure Laterality Date  . CERVICAL FUSION    . HERNIA REPAIR    . KNEE ARTHROSCOPY  1984   both knee    There were no vitals filed for this visit.      Subjective Assessment - 09/06/16 1607    Subjective Rick reports that he was sore from the DN - and pain seemed to actually increased for a couple of days. he does not have the pain that he did have today - just some soreness. He picked up about 50 pounds this morning and did not have increased pain. Gregory West has increased back pain in the mornings when getting out of bed or when he stands back up after having been sitting at the desk for a long period of time. He does not want to try DN - at least not now, maybe later.    Currently in Pain? Yes   Pain Score 6    Pain Location Back   Pain Orientation Lower;Right;Left   Pain Descriptors / Indicators Nagging;Spasm   Pain Type Acute pain   Pain Onset 1 to 4 weeks ago   Pain Frequency Constant  intensity varies             [facility] PT Assessment - 09/06/16 0001      Assessment   Medical Diagnosis Thoracolumbar strain   Referring Provider Dr Clementeen Graham    Onset Date/Surgical Date 07/20/16   Hand Dominance Right   Next MD Visit 10/08/16   Prior Therapy at [facility] for other musculoskeletal problems      AROM   Lumbar Flexion 60%   Lumbar Extension 30%   Lumbar - Right Side Bend 40%   Lumbar - Left Side Bend 40%     Palpation   Spinal mobility tight and tender through thoracic and lumbar spine with CPA mobs Grade II/III   Palpation comment significant tightness noted through the thoraco-lumbar paraspinals; lats; QL; multifidi Rt > Lt                      [facility] Adult PT Treatment/Exercise - 09/06/16 0001      Self-Care   Self-Care --  instructed in application, use, care of TENS unit      Lumbar Exercises: Stretches   Double Knee to Chest Stretch 5 reps;30 seconds  supine with strap    Lower Trunk Rotation 5 reps  5-10 sec  Lumbar Exercises: Aerobic   UBE (Upper Arm Bike) L2 x 5 min fwd/back      Lumbar Exercises: Standing   Scapular Retraction --  scap squeeze w/ noodle 10 sec x 10    Theraband Level (Scapular Retraction) Level 2 (Red)   Scapular Retraction Limitations active scap retraction - W's x 10    Row Strengthening;Both;20 reps   Theraband Level (Row) Level 3 (Green)   Shoulder Extension Strengthening;Both;20 reps   Theraband Level (Shoulder Extension) Level 3 (Green)     Shoulder Exercises: Stretch   Other Shoulder Stretches 3 way doorway stretch 30 sec x 2    Other Shoulder Stretches standing leaning forward over lg phiso ball supported on treatment table stretch through the mid to lower back; sitting for lateral trunk flexion toward either side; siting bending forward to stretch mid to low back - each stretch 20-40 sec x 3 each      Moist Heat Therapy   Number Minutes Moist Heat 15 Minutes   Moist Heat Location Lumbar Spine  thoracic      Electrical Stimulation   Electrical Stimulation Location bilat  thoracic and lumbar paraspinals    Electrical Stimulation Action IFC   Electrical Stimulation Parameters to tolerance   Electrical Stimulation Goals Pain;Tone  tightness                 PT Education - 09/06/16 1635    Education provided Yes   Education Details HEP    Person(s) Educated Patient   Methods Explanation;Demonstration;Tactile cues;Verbal cues;Handout   Comprehension Verbalized understanding;Returned demonstration;Verbal cues required;Tactile cues required             PT Long Term Goals - 09/06/16 1610      PT LONG TERM GOAL #1   Title Improve trunk mobilty in all planes by 15-20% to allow improved motion with decreased pain 10/04/16   Time 6   Period Weeks   Status On-going     PT LONG TERM GOAL #2   Title Patient to report ability to breath deeply without pain in thoracic spine 10/06/16   Time 6   Period Weeks   Status On-going     PT LONG TERM GOAL #3   Title Patient reports return to normal functional acitivites with no more than 2/10 pain 10/06/16   Time 6   Period Weeks   Status On-going     PT LONG TERM GOAL #4   Title Independent in HEP 10/06/16   Time 6   Period Weeks   Status On-going     PT LONG TERM GOAL #5   Title Improve FOTO </= 36% limitation 10/06/16   Time 6   Period Weeks   Status On-going               Plan - 09/06/16 1652    Clinical Impression Statement Gregory West reports soreness and some increased pain in the back following the DN. He responded well to the stretching and stabilization exercises today with no increase in pain reported. Instructed in application, use and care of TENS unit and tried unit in clinic. Gradually progressing toward stated goals of therapy.    Rehab Potential Good   PT Frequency 2x / week   PT Duration 6 weeks   PT Treatment/Interventions Patient/family education;ADLs/Self Care Home Management;Neuromuscular re-education;Cryotherapy;Electrical Stimulation;Iontophoresis /ml Dexamethasone;Moist  Heat;Traction;Ultrasound;Dry needling;Manual techniques;Therapeutic activities;Therapeutic exercise   PT Next Visit Plan stretch for thoraco-lumbar paraspinals; manual work to thoracic musculature as indicated; education in body  mechanics; modalities as indicated    Consulted and Agree with Plan of Care Patient      Patient will benefit from skilled therapeutic intervention in order to improve the following deficits and impairments:  Postural dysfunction, Improper body mechanics, Pain, Decreased range of motion, Decreased mobility, Increased fascial restricitons, Increased muscle spasms, Decreased activity tolerance  Visit Diagnosis: Pain in thoracic spine  Acute bilateral low back pain without sciatica  Other symptoms and signs involving the musculoskeletal system     Problem List Patient Active Problem List   Diagnosis Date Noted  . Seborrheic keratoses 04/01/2016  . Diverticulosis of colon without hemorrhage 02/26/2016  . Anxiety state 10/02/2015  . BPH (benign prostatic hyperplasia) 07/31/2015  . Carpal tunnel syndrome 06/04/2015  . 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 Vitamin D deficiency 03/06/2015  . History of pancreatitis 03/06/2015  . Chronic back pain 03/04/2015  . Type II diabetes mellitus with complication (HCC) 05/15/2014  . Obstructive sleep apnea 05/15/2014  . Hyperlipidemia 05/15/2014  . Glaucoma suspect 10/21/2011    Lavone Barrientes Rober Minion PT, MPH  09/06/2016, 4:57 PM  [facility] [address] [place] [address] [place], [place], [address] Phone: [phone]   Fax:  [phone]  Name: MARKEVIOUS EHMKE [name] MRN: [id number] Date of Birth: [birth year]

## 2016-09-06 NOTE — Patient Instructions (Addendum)
Resisted External Rotation: in Neutral - Bilateral   PALMS UP Sit or stand, tubing in both hands, elbows at sides, bent to 90, forearms forward. Pinch shoulder blades together and rotate forearms out. Keep elbows at sides. Repeat __10__ times per set. Do _2-3___ sets per session. Do _1-2___ sessions per day.   Low Row: Standing   Face anchor, feet shoulder width apart. Palms up, pull arms back, squeezing shoulder blades together. Repeat 10__ times per set. Do 2-3__ sets per session. Do 1-2_ sessions per day Anchor Height: Waist   Strengthening: Resisted Extension   Hold tubing in right hand, arm forward. Pull arm back, elbow straight. Repeat _10___ times per set. Do 2-3____ sets per session. Do  1-2____ sessions per day.   Standing at counter or sofa leaning across pillows of ball to stretch through the mid to low back - hold 30-60 sec x 3-5 reps 1-2 times/day   Side Bend, Sitting    Sit cross-legged on floor. Bend to one side, touching elbow to floor. Hold _20-30__ seconds. To increase stretch, raise other arm above head.  Repeat _2-3__ times per session. Do ___ sessions per day.   Sitting    Sit in chair with knees spread apart. Bend forward toward floor. Comfortable stretch should be felt in lower back. Hold _20-30__ seconds. Repeat _2-3__ times per session. Do _1-2__ sessions per day.

## 2016-09-09 ENCOUNTER — Encounter: Payer: BLUE CROSS/BLUE SHIELD | Admitting: Physical Therapy

## 2016-09-13 ENCOUNTER — Ambulatory Visit (INDEPENDENT_AMBULATORY_CARE_PROVIDER_SITE_OTHER): Payer: BLUE CROSS/BLUE SHIELD | Admitting: Physical Therapy

## 2016-09-13 DIAGNOSIS — R29898 Other symptoms and signs involving the musculoskeletal system: Secondary | ICD-10-CM

## 2016-09-13 DIAGNOSIS — M545 Low back pain, unspecified: Secondary | ICD-10-CM

## 2016-09-13 DIAGNOSIS — M546 Pain in thoracic spine: Secondary | ICD-10-CM

## 2016-09-13 NOTE — Therapy (Signed)
[facility] [place] [address] [place] [address] [place], [place], [address] Phone: [phone]   Fax:  [phone]  Physical Therapy Treatment  Patient Details  Name: Gregory HEWINS [name] MRN: [id number] Date of Birth: [birth year] Referring Provider: Dr. Clementeen Graham   Encounter Date: 09/13/2016      PT End of Session - 09/13/16 1518    Visit Number 4   Number of Visits 12   Date for PT Re-Evaluation 10/06/16   PT Start Time 1515   PT Stop Time 1612   PT Time Calculation (min) 57 min   Activity Tolerance Patient tolerated treatment well;No increased pain      Past Medical History:  Diagnosis Date  . Arthritis   . Chest pain    a. Normal cors 2001. b. Neg stress test 2012, 05/2014.  . Diabetes mellitus without complication (HCC)   . Hyperlipidemia   . Hypertensive response to exercise   . Obesity   . Obstructive sleep apnea    Currently untreated     Past Surgical History:  Procedure Laterality Date  . CERVICAL FUSION    . HERNIA REPAIR    . KNEE ARTHROSCOPY  1984   both knee    There were no vitals filed for this visit.      Subjective Assessment - 09/13/16 1519    Subjective Pt reports he has had increased pain today since pushing washer/dryer off of truck by himself (couldn't find help).  He hasn't done exercises for 2 days.  Has been using TENS for pain relief.   Deep breaths are still painful, as are transitional movements.    Currently in Pain? Yes   Pain Score 8    Pain Location Back   Pain Orientation Right;Left;Lower   Pain Descriptors / Indicators Spasm;Nagging   Aggravating Factors  transition motions   Pain Relieving Factors heat, TENS            [facility] PT Assessment - 09/13/16 0001      Assessment   Medical Diagnosis Thoracolumbar strain   Referring Provider Dr. Clementeen Graham            [facility] Adult PT Treatment/Exercise - 09/13/16 0001      Self-Care   Self-Care Other Self-Care Comments   Other  Self-Care Comments  Pt educated on self massage with ball to lower back.  Pt returned demo      Lumbar Exercises: Stretches   Lower Trunk Rotation 5 reps  5-10 sec, arms T, then Y.      Lumbar Exercises: Aerobic   UBE (Upper Arm Bike) L2 x 4 min fwd/back      Lumbar Exercises: Seated   Sit to Stand 10 reps  with ab set   Sit to Stand Limitations improved pain level with Rt foot forward semi tandem stance.      Lumbar Exercises: Supine   Ab Set 5 seconds;10 reps   AB Set Limitations tactile cues   Bent Knee Raise 10 reps;2 seconds  with ab set   Bent Knee Raise Limitations multiple cues for proper engagement.      Shoulder Exercises: Stretch   Other Shoulder Stretches 3 way doorway stretch 30 sec x 2    Other Shoulder Stretches standing modified childs pose with arms on back of truck.   Seated with arms on ball- rolling forward then lateral flexion x 2 reps each side.      Moist Heat Therapy   Moist Heat Location Lumbar  Spine  thoracic      Electrical Stimulation   Electrical Stimulation Location bilat thoracic and lumbar paraspinals    Electrical Stimulation Action IFC   Electrical Stimulation Parameters to tolerance    Electrical Stimulation Goals Pain;Tone  tightness                      PT Long Term Goals - 09/06/16 1610      PT LONG TERM GOAL #1   Title Improve trunk mobilty in all planes by 15-20% to allow improved motion with decreased pain 10/04/16   Time 6   Period Weeks   Status On-going     PT LONG TERM GOAL #2   Title Patient to report ability to breath deeply without pain in thoracic spine 10/06/16   Time 6   Period Weeks   Status On-going     PT LONG TERM GOAL #3   Title Patient reports return to normal functional acitivites with no more than 2/10 pain 10/06/16   Time 6   Period Weeks   Status On-going     PT LONG TERM GOAL #4   Title Independent in HEP 10/06/16   Time 6   Period Weeks   Status On-going     PT LONG TERM GOAL #5    Title Improve FOTO </= 36% limitation 10/06/16   Time 6   Period Weeks   Status On-going               Plan - 09/13/16 1559    Clinical Impression Statement Pt required frequent cues for proper engagement of core muscles in supine.  Once consistent, pt was able to engage core with sit to stand with decreased back pain.  Pt making gradual gains towards established goals.    Rehab Potential Good   PT Frequency 2x / week   PT Duration 6 weeks   PT Treatment/Interventions Patient/family education;ADLs/Self Care Home Management;Neuromuscular re-education;Cryotherapy;Electrical Stimulation;Iontophoresis /ml Dexamethasone;Moist Heat;Traction;Ultrasound;Dry needling;Manual techniques;Therapeutic activities;Therapeutic exercise   PT Next Visit Plan stretch for thoraco-lumbar paraspinals; manual work to thoracic musculature as indicated; education in Estate manager/land agent; modalities as indicated    Consulted and Agree with Plan of Care Patient      Patient will benefit from skilled therapeutic intervention in order to improve the following deficits and impairments:  Postural dysfunction, Improper body mechanics, Pain, Decreased range of motion, Decreased mobility, Increased fascial restricitons, Increased muscle spasms, Decreased activity tolerance  Visit Diagnosis: Pain in thoracic spine  Acute bilateral low back pain without sciatica  Other symptoms and signs involving the musculoskeletal system     Problem List Patient Active Problem List   Diagnosis Date Noted  . Seborrheic keratoses 04/01/2016  . Diverticulosis of colon without hemorrhage 02/26/2016  . Anxiety state 10/02/2015  . BPH (benign prostatic hyperplasia) 07/31/2015  . Carpal tunnel syndrome 06/04/2015  . 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 Vitamin D deficiency 03/06/2015  . History of pancreatitis 03/06/2015  . Chronic back pain 03/04/2015  . Type II diabetes mellitus with complication (HCC) 05/15/2014   . Obstructive sleep apnea 05/15/2014  . Hyperlipidemia 05/15/2014  . Glaucoma suspect 10/21/2011   Mayer Camel, PTA 09/13/16 4:03 PM  [facility] [place] [address] [place] [address] [place], [place], [address] Phone: [phone]   Fax:  [phone]  Name: Gregory ROSAS [name] MRN: [id number] Date of Birth: 1951-01-30

## 2016-09-16 ENCOUNTER — Ambulatory Visit (INDEPENDENT_AMBULATORY_CARE_PROVIDER_SITE_OTHER): Payer: BLUE CROSS/BLUE SHIELD | Admitting: Rehabilitative and Restorative Service Providers"

## 2016-09-16 ENCOUNTER — Encounter: Payer: Self-pay | Admitting: Rehabilitative and Restorative Service Providers"

## 2016-09-16 DIAGNOSIS — M546 Pain in thoracic spine: Secondary | ICD-10-CM

## 2016-09-16 DIAGNOSIS — M545 Low back pain, unspecified: Secondary | ICD-10-CM

## 2016-09-16 DIAGNOSIS — R29898 Other symptoms and signs involving the musculoskeletal system: Secondary | ICD-10-CM

## 2016-09-16 NOTE — Patient Instructions (Signed)
  Abdominal Bracing With Pelvic Floor (Hook-Lying)   With neutral spine, tighten pelvic floor and abdominals. Hold 10 seconds. Repeat __10_ times. Do _1__ times a day.   Knee to Chest: Transverse Plane Stability   Bring one knee up, then return. Be sure pelvis does not roll side to side. Keep pelvis still. Lift knee __10_ times each leg. Restabilize pelvis. Repeat with other leg. Do _1-2__ sets, _1__ times per day.   Hip External Rotation With Pillow: Transverse Plane Stability  Green or blue theraband  Both knees bent. Slowly roll bent knee out. Be sure pelvis does not rotate. Do _10__ times. Restabilize pelvis. Repeat with other leg. Do _1-2__ sets, _1__ times per day.    [facility] [address] [place], [place] [address]  [phone] (office) [phone] (fax)

## 2016-09-16 NOTE — Therapy (Signed)
[facility] [place] [address] [place] [address] [place], [place], [address] Phone: [phone]   Fax:  [phone]  Physical Therapy Treatment  Patient Details  Name: Gregory West [name] MRN: [id number] Date of Birth: 1951/03/12 Referring Provider: Dr. Clementeen Graham   Encounter Date: 09/16/2016      PT End of Session - 09/16/16 1622    Visit Number 5   Number of Visits 12   Date for PT Re-Evaluation 10/06/16   PT Start Time 1617   PT Stop Time 1710   PT Time Calculation (min) 53 min   Activity Tolerance Patient tolerated treatment well      Past Medical History:  Diagnosis Date  . Arthritis   . Chest pain    a. Normal cors 2001. b. Neg stress test 2012, 05/2014.  . Diabetes mellitus without complication (HCC)   . Hyperlipidemia   . Hypertensive response to exercise   . Obesity   . Obstructive sleep apnea    Currently untreated     Past Surgical History:  Procedure Laterality Date  . CERVICAL FUSION    . HERNIA REPAIR    . KNEE ARTHROSCOPY  1984   both knee    There were no vitals filed for this visit.      Subjective Assessment - 09/16/16 1625    Subjective Gregory West reports that he is still having some pain in the mid back especially on the Rt side. He has had a couple of good days. He has most pain when moving from sit to stand - but it helps the pain if he engages the core. He has not done any exercises in the past couple of days.    Currently in Pain? Yes   Pain Score 6    Pain Location Back   Pain Orientation Right;Mid   Pain Descriptors / Indicators Nagging;Tightness   Pain Type Acute pain   Pain Radiating Towards no longer radiating into the tail bone like it was whe he started therapy.    Pain Onset More than a month ago   Pain Frequency Constant  intensity of pain is decreasing   Aggravating Factors  transitional movements; steps    Pain Relieving Factors heat; TENS unit                           [facility] Adult PT Treatment/Exercise - 09/16/16 0001      Lumbar Exercises: Stretches   Double Knee to Chest Stretch 5 reps;30 seconds   Lower Trunk Rotation 5 reps  5-10 sec, arms T, then Y.      Lumbar Exercises: Aerobic   Stationary Bike UBE with arms @ 12 L5 x 5 min      Lumbar Exercises: Seated   Sit to Stand 10 reps  with core engaged      Lumbar Exercises: Supine   Ab Set --  3 part core 10 sec x 10    Clam 10 reps;2 seconds  supine core engaged one knee then other alt blue band    Bent Knee Raise 10 reps;2 seconds  with ab set     Shoulder Exercises: Stretch   Other Shoulder Stretches 3 way doorway stretch 30 sec x 2    Other Shoulder Stretches standing modified childs pose with arms on back of truck.   Seated with arms on ball- rolling forward then lateral flexion x 2 reps each side.      Moist  Heat Therapy   Number Minutes Moist Heat 15 Minutes   Moist Heat Location Lumbar Spine  thoracic      Electrical Stimulation   Electrical Stimulation Location R thoracic    Electrical Stimulation Action IFC   Electrical Stimulation Parameters to tolerance   Electrical Stimulation Goals Pain;Tone  tightness      Manual Therapy   Manual therapy comments pt prone    Soft tissue mobilization deep tissue work; IASTM with edge tool working through the Rt >> Lt thoracic musculature through mid to lower thoracic and upper lumbar    Myofascial Release Rt thoracic                 PT Education - 09/16/16 1644    Education provided Yes   Education Details HEP    Person(s) Educated Patient   Methods Explanation;Demonstration;Tactile cues;Verbal cues;Handout   Comprehension Verbalized understanding;Returned demonstration;Verbal cues required;Tactile cues required             PT Long Term Goals - 09/16/16 1629      PT LONG TERM GOAL #1   Title Improve trunk mobilty in all planes by 15-20% to allow improved motion with decreased pain 10/04/16   Time 6   Period Weeks    Status On-going     PT LONG TERM GOAL #2   Title Patient to report ability to breath deeply without pain in thoracic spine 10/06/16   Time 6   Period Weeks   Status On-going     PT LONG TERM GOAL #3   Title Patient reports return to normal functional acitivites with no more than 2/10 pain 10/06/16   Time 6   Period Weeks   Status On-going     PT LONG TERM GOAL #4   Title Independent in HEP 10/06/16   Time 6   Period Weeks   Status On-going     PT LONG TERM GOAL #5   Title Improve FOTO </= 36% limitation 10/06/16   Time 6   Period Weeks   Status On-going               Plan - 09/16/16 1631    Clinical Impression Statement Patient continues to have thoracic pain but the intensity is decreased. He is working to engage core with transitional movements which helps to decrease pain. Note decrease in muscular tightness with treatment.    Rehab Potential Good   PT Frequency 2x / week   PT Duration 6 weeks   PT Treatment/Interventions Patient/family education;ADLs/Self Care Home Management;Neuromuscular re-education;Cryotherapy;Electrical Stimulation;Iontophoresis /ml Dexamethasone;Moist Heat;Traction;Ultrasound;Dry needling;Manual techniques;Therapeutic activities;Therapeutic exercise   PT Next Visit Plan stretch for thoraco-lumbar paraspinals; manual work to thoracic musculature as indicated; education in Estate manager/land agent; modalities as indicated    Consulted and Agree with Plan of Care Patient      Patient will benefit from skilled therapeutic intervention in order to improve the following deficits and impairments:  Postural dysfunction, Improper body mechanics, Pain, Decreased range of motion, Decreased mobility, Increased fascial restricitons, Increased muscle spasms, Decreased activity tolerance  Visit Diagnosis: Pain in thoracic spine  Acute bilateral low back pain without sciatica  Other symptoms and signs involving the musculoskeletal system     Problem  List Patient Active Problem List   Diagnosis Date Noted  . Seborrheic keratoses 04/01/2016  . Diverticulosis of colon without hemorrhage 02/26/2016  . Anxiety state 10/02/2015  . BPH (benign prostatic hyperplasia) 07/31/2015  . Carpal tunnel syndrome 06/04/2015  . Hypogonadotropic hypogonadism (HCC)  03/06/2015  . Vitamin D deficiency 03/06/2015  . History of pancreatitis 03/06/2015  . Chronic back pain 03/04/2015  . Type II diabetes mellitus with complication (HCC) 05/15/2014  . Obstructive sleep apnea 05/15/2014  . Hyperlipidemia 05/15/2014  . Glaucoma suspect 10/21/2011    Camela Wich Rober Minion PT, MPH  09/16/2016, 5:01 PM  [facility] [address] [place] [address] [place], [place], [address] Phone: [phone]   Fax:  [phone]  Name: ISACC TURNEY [name] MRN: [id number] Date of Birth: 12-27-1950

## 2016-09-23 ENCOUNTER — Ambulatory Visit (INDEPENDENT_AMBULATORY_CARE_PROVIDER_SITE_OTHER): Payer: BLUE CROSS/BLUE SHIELD | Admitting: Physical Therapy

## 2016-09-23 DIAGNOSIS — M546 Pain in thoracic spine: Secondary | ICD-10-CM

## 2016-09-23 DIAGNOSIS — M545 Low back pain, unspecified: Secondary | ICD-10-CM

## 2016-09-23 DIAGNOSIS — R29898 Other symptoms and signs involving the musculoskeletal system: Secondary | ICD-10-CM | POA: Diagnosis not present

## 2016-09-23 NOTE — Therapy (Signed)
[facility] [place] [address] [place], [place], [address] Phone: [phone]   Fax:  [phone]  Physical Therapy Treatment  Patient Details  Name: Gregory GILDERSLEEVE [name] MRN: [id number] Date of Birth: 1950-08-22 Referring Provider: Dr. Clementeen Graham   Encounter Date: 09/23/2016      PT End of Session - 09/23/16 1659    Visit Number 6   Number of Visits 12   Date for PT Re-Evaluation 10/06/16   PT Start Time 1616   PT Stop Time 1717   PT Time Calculation (min) 61 min   Activity Tolerance Patient tolerated treatment well   Behavior During Therapy [facility] for tasks assessed/performed      Past Medical History:  Diagnosis Date  . Arthritis   . Chest pain    a. Normal cors 2001. b. Neg stress test 2012, 05/2014.  . Diabetes mellitus without complication (HCC)   . Hyperlipidemia   . Hypertensive response to exercise   . Obesity   . Obstructive sleep apnea    Currently untreated     Past Surgical History:  Procedure Laterality Date  . CERVICAL FUSION    . HERNIA REPAIR    . KNEE ARTHROSCOPY  1984   both knee    There were no vitals filed for this visit.      Subjective Assessment - 09/23/16 1623    Subjective Pt reports he woke up the morning and his legs were dangling off the bed. He thinks this is why his low back is feeling bad today.  He has been doing his HEP and his midback pain is gone.   He reports 80% improvement of symptoms since initial visit.    Patient Stated Goals make the back more comfortable    Currently in Pain? Yes   Pain Score 5    Pain Location Back   Pain Orientation Lower  center   Pain Descriptors / Indicators Aching   Aggravating Factors  sleep position   Pain Relieving Factors Heat, TENS unit            [facility] PT Assessment - 09/23/16 0001      Assessment   Medical Diagnosis Thoracolumbar strain   Referring Provider Dr. Clementeen Graham    Onset Date/Surgical Date 07/20/16   Hand  Dominance Right   Next MD Visit 10/08/16         [facility] Adult PT Treatment/Exercise - 09/23/16 0001      Lumbar Exercises: Stretches   Passive Hamstring Stretch 2 reps;30 seconds  supine with strap   Lower Trunk Rotation 5 reps  5-10 sec, arms T, then Y.    Piriformis Stretch 2 reps;30 seconds  strap assist     Lumbar Exercises: Aerobic   Stationary Bike UBE standing:  L5 x 5 min      Lumbar Exercises: Standing   Theraband Level (Scapular Retraction) Level 3 (Green)  core engaged   Scapular Retraction Limitations active scap retraction - W's x 20   Row Strengthening;Both;20 reps  2 sets, core engaged   Theraband Level (Row) Level 3 (Green)   Shoulder Extension Strengthening;Both;20 reps  2 sets, core engaged   Theraband Level (Shoulder Extension) Level 3 (Green)     Shoulder Exercises: Stretch   Other Shoulder Stretches 3 way doorway stretch 30 sec x 3   Other Shoulder Stretches standing modified childs pose with arms on back of truck.   Seated with arms on ball- rolling forward then lateral flexion  x 2 reps each side.      Moist Heat Therapy   Number Minutes Moist Heat 15 Minutes   Moist Heat Location Lumbar Spine     Electrical Stimulation   Electrical Stimulation Location lumbar paraspinals    Electrical Stimulation Action IFC   Electrical Stimulation Parameters  to tolerance    Electrical Stimulation Goals Pain           PT Long Term Goals - 09/23/16 1658      PT LONG TERM GOAL #1   Title Improve trunk mobilty in all planes by 15-20% to allow improved motion with decreased pain 10/04/16   Time 6   Period Weeks   Status On-going     PT LONG TERM GOAL #2   Title Patient to report ability to breath deeply without pain in thoracic spine 10/06/16   Time 6   Period Weeks   Status Achieved     PT LONG TERM GOAL #3   Title Patient reports return to normal functional acitivites with no more than 2/10 pain 10/06/16   Time 6   Period Weeks   Status On-going   reporting 3-5/10 pain: 09/23/16     PT LONG TERM GOAL #4   Title Independent in HEP 10/06/16   Time 6   Period Weeks   Status On-going     PT LONG TERM GOAL #5   Title Improve FOTO </= 36% limitation 10/06/16   Time 6   Period Weeks   Status On-going               Plan - 09/23/16 1657    Clinical Impression Statement Pt tolerated all exercises well, without any increase in pain.  He has had great response to therapy, reporting 80% improvement in symptoms since starting therapy.  Pt has met LTG#2.    Rehab Potential Good   PT Frequency 2x / week   PT Duration 6 weeks   PT Treatment/Interventions Patient/family education;ADLs/Self Care Home Management;Neuromuscular re-education;Cryotherapy;Electrical Stimulation;Iontophoresis '4mg'$ /ml Dexamethasone;Moist Heat;Traction;Ultrasound;Dry needling;Manual techniques;Therapeutic activities;Therapeutic exercise   PT Next Visit Plan stretch for thoraco-lumbar paraspinals; manual work to thoracic musculature as indicated; education in Economist; modalities as indicated    Consulted and Agree with Plan of Care Patient      Patient will benefit from skilled therapeutic intervention in order to improve the following deficits and impairments:  Postural dysfunction, Improper body mechanics, Pain, Decreased range of motion, Decreased mobility, Increased fascial restricitons, Increased muscle spasms, Decreased activity tolerance  Visit Diagnosis: Pain in thoracic spine  Acute bilateral low back pain without sciatica  Other symptoms and signs involving the musculoskeletal system     Problem List Patient Active Problem List   Diagnosis Date Noted  . Seborrheic keratoses 04/01/2016  . Diverticulosis of colon without hemorrhage 02/26/2016  . Anxiety state 10/02/2015  . BPH (benign prostatic hyperplasia) 07/31/2015  . Carpal tunnel syndrome 06/04/2015  . 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 Vitamin D deficiency 03/06/2015   . History of pancreatitis 03/06/2015  . Chronic back pain 03/04/2015  . Type II diabetes mellitus with complication ([place]) 00/17/4944  . Obstructive sleep apnea 05/15/2014  . Hyperlipidemia 05/15/2014  . Glaucoma suspect 10/21/2011   Kerin Perna, PTA 09/23/16 5:10 PM  [facility] [place], [place], [address] Phone: [phone]   Fax:  [phone]  Name: Gregory NUTTALL [name] MRN: [id number] Date of Birth: January 23, 1951

## 2016-09-29 ENCOUNTER — Encounter: Payer: BLUE CROSS/BLUE SHIELD | Admitting: Physical Therapy

## 2016-09-29 ENCOUNTER — Ambulatory Visit (INDEPENDENT_AMBULATORY_CARE_PROVIDER_SITE_OTHER): Payer: BLUE CROSS/BLUE SHIELD | Admitting: Rehabilitative and Restorative Service Providers"

## 2016-09-29 ENCOUNTER — Encounter: Payer: Self-pay | Admitting: Rehabilitative and Restorative Service Providers"

## 2016-09-29 DIAGNOSIS — M545 Low back pain, unspecified: Secondary | ICD-10-CM

## 2016-09-29 DIAGNOSIS — R29898 Other symptoms and signs involving the musculoskeletal system: Secondary | ICD-10-CM | POA: Diagnosis not present

## 2016-09-29 DIAGNOSIS — M546 Pain in thoracic spine: Secondary | ICD-10-CM | POA: Diagnosis not present

## 2016-09-29 NOTE — Therapy (Addendum)
[facility], [place], [address] Phone: [phone]   Fax:  [phone]  Physical Therapy Treatment  Patient Details  Name: Gregory GUST [name] MRN: [id number] Date of Birth: Jun 09, 1950 Referring Provider: Dr Lynne Leader   Encounter Date: 09/29/2016      PT End of Session - 09/29/16 1522    Visit Number 7   Number of Visits 12   Date for PT Re-Evaluation 10/06/16   PT Start Time 1520   PT Stop Time 1608   PT Time Calculation (min) 48 min   Activity Tolerance Patient tolerated treatment well      Past Medical History:  Diagnosis Date  . Arthritis   . Chest pain    a. Normal cors 2001. b. Neg stress test 2012, 05/2014.  . Diabetes mellitus without complication ([place])   . Hyperlipidemia   . Hypertensive response to exercise   . Obesity   . Obstructive sleep apnea    Currently untreated     Past Surgical History:  Procedure Laterality Date  . CERVICAL FUSION    . HERNIA REPAIR    . KNEE ARTHROSCOPY  1984   both knee    There were no vitals filed for this visit.      Subjective Assessment - 09/29/16 1523    Subjective Mid bick is feeling well. Less pain - has only used TENS unit twice on the mid back. Liliane Channel is increasing activity level. Has only had mid back pain a couple of times in the past week - was a couple of times a day.    Currently in Pain? Yes   Pain Score 2    Pain Location Back   Pain Orientation Mid   Pain Descriptors / Indicators --  'sticking" type pain   Pain Type Acute pain   Pain Onset More than a month ago   Pain Frequency Intermittent   Aggravating Factors  playing with grandchildren   Pain Relieving Factors heat TENS             [facility] PT Assessment - 09/29/16 0001      Assessment   Medical Diagnosis Thoracolumbar strain   Referring Provider Dr Lynne Leader    Onset Date/Surgical Date 07/20/16   Hand Dominance Right   Next MD Visit 10/08/16     Observation/Other Assessments   Focus on Therapeutic Outcomes (FOTO)  29% limitation     AROM   Lumbar Flexion 80%   Lumbar Extension 55%   Lumbar - Right Side Bend 55%   Lumbar - Left Side Bend 55%   Lumbar - Right Rotation 40%  "twinge"    Lumbar - Left Rotation 40%     Strength   Overall Strength Comments LE strength WFL's bilat      Palpation   Palpation comment decreased tightness noted through the thoraco-lumbar paraspinals; lats; QL; multifidi Rt > Lt                              PT Education - 09/29/16 1546    Education provided Yes   Education Details encouraged consistent work - focus on stretching pecs and strengthening posterior shoulder girdle musculature    Person(s) Educated Patient   Methods Explanation;Demonstration;Verbal cues   Comprehension Verbalized understanding;Returned demonstration;Verbal cues required             PT Long Term Goals - 09/29/16 1529  PT LONG TERM GOAL #1   Title Improve trunk mobilty in all planes by 15-20% to allow improved motion with decreased pain 10/04/16   Time 6   Period Weeks   Status Achieved     PT LONG TERM GOAL #2   Title Patient to report ability to breath deeply without pain in thoracic spine 10/06/16   Time 6   Period Weeks   Status Achieved     PT LONG TERM GOAL #3   Title Patient reports return to normal functional acitivites with no more than 2/10 pain 10/06/16   Time 6   Period Weeks   Status Achieved     PT LONG TERM GOAL #4   Title Independent in HEP 10/06/16   Time 6   Period Weeks   Status Achieved     PT LONG TERM GOAL #5   Title Improve FOTO </= 36% limitation 10/06/16   Time 6   Period Weeks   Status Achieved               Plan - 09/29/16 1527    Clinical Impression Statement Progressing well toward stated goals of therapy. He reports decreased pain and demonstrates increased mobilty and function. Most goals of therapy have been accomplished - see goal section  in note. Patientwil lbe placed on hold for 2 weeks to assess ability to maintain gains. He will be discharged from therapy if he does not call to reschedule. Excellent progress.    Rehab Potential Good   PT Frequency 2x / week   PT Duration 6 weeks   PT Treatment/Interventions Patient/family education;ADLs/Self Care Home Management;Neuromuscular re-education;Cryotherapy;Electrical Stimulation;Iontophoresis '4mg'$ /ml Dexamethasone;Moist Heat;Traction;Ultrasound;Dry needling;Manual techniques;Therapeutic activities;Therapeutic exercise   PT Next Visit Plan continue with stretching and strengthening with HEP - patient will be on hold x 2 weeks will call to reschedule if needed    Consulted and Agree with Plan of Care Patient      Patient will benefit from skilled therapeutic intervention in order to improve the following deficits and impairments:  Postural dysfunction, Improper body mechanics, Pain, Decreased range of motion, Decreased mobility, Increased fascial restricitons, Increased muscle spasms, Decreased activity tolerance  Visit Diagnosis: Pain in thoracic spine  Acute bilateral low back pain without sciatica  Other symptoms and signs involving the musculoskeletal system     Problem List Patient Active Problem List   Diagnosis Date Noted  . Seborrheic keratoses 04/01/2016  . Diverticulosis of colon without hemorrhage 02/26/2016  . Anxiety state 10/02/2015  . BPH (benign prostatic hyperplasia) 07/31/2015  . Carpal tunnel syndrome 06/04/2015  . 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 Vitamin D deficiency 03/06/2015  . History of pancreatitis 03/06/2015  . Chronic back pain 03/04/2015  . Type II diabetes mellitus with complication ([place]) 03/24/8526  . Obstructive sleep apnea 05/15/2014  . Hyperlipidemia 05/15/2014  . Glaucoma suspect 10/21/2011    Michae Grimley Nilda Simmer PT, MPH  09/29/2016, 4:11 PM  [facility] [place]  [place], [place], [address] Phone: [phone]   Fax:  [phone]  Name: Gregory COSTANZO [name] MRN: [id number] Date of Birth: [birth year]   PHYSICAL THERAPY DISCHARGE SUMMARY  Visits from Start of Care: 7  Current functional level related to goals / functional outcomes: See progress note for discharge status   Remaining deficits: Should continue with independent HEP    Education / Equipment: HEP  Plan: Patient agrees to discharge.  Patient goals were met. Patient is being discharged due to  meeting the stated rehab goals.  ?????    Alexsandria Kivett P. Helene Kelp PT, MPH 10/21/16 8:16 AM

## 2016-09-30 ENCOUNTER — Ambulatory Visit: Payer: 59 | Admitting: Family Medicine

## 2016-10-05 LAB — LIPID PANEL
CHOLESTEROL: 158 mg/dL (ref 0–200)
HDL: 44 mg/dL (ref 35–70)
LDL CALC: 102 mg/dL
LDl/HDL Ratio: 3.6
Triglycerides: 87 mg/dL (ref 40–160)

## 2016-10-05 LAB — HEPATIC FUNCTION PANEL
ALT: 22 U/L (ref 10–40)
AST: 17 U/L (ref 14–40)
Alkaline Phosphatase: 60 U/L (ref 25–125)
Bilirubin, Total: 0.4 mg/dL

## 2016-10-05 LAB — BASIC METABOLIC PANEL
BUN: 11 mg/dL (ref 4–21)
Creatinine: 0.8 mg/dL (ref 0.6–1.3)
GLUCOSE: 143 mg/dL
Potassium: 4 mmol/L (ref 3.4–5.3)
Sodium: 139 mmol/L (ref 137–147)

## 2016-10-05 LAB — HEMOGLOBIN A1C: Hemoglobin A1C: 8.8

## 2016-10-05 LAB — CBC AND DIFFERENTIAL: HEMOGLOBIN: 14.7 g/dL (ref 13.5–17.5)

## 2016-10-06 ENCOUNTER — Ambulatory Visit: Payer: BLUE CROSS/BLUE SHIELD | Admitting: Family Medicine

## 2016-10-07 ENCOUNTER — Ambulatory Visit (INDEPENDENT_AMBULATORY_CARE_PROVIDER_SITE_OTHER): Payer: BLUE CROSS/BLUE SHIELD | Admitting: Family Medicine

## 2016-10-07 ENCOUNTER — Encounter: Payer: BLUE CROSS/BLUE SHIELD | Admitting: Rehabilitative and Restorative Service Providers"

## 2016-10-07 ENCOUNTER — Encounter: Payer: Self-pay | Admitting: Family Medicine

## 2016-10-07 ENCOUNTER — Telehealth: Payer: Self-pay | Admitting: Family Medicine

## 2016-10-07 VITALS — BP 133/78 | HR 72 | Ht 69.5 in | Wt 285.0 lb

## 2016-10-07 DIAGNOSIS — E118 Type 2 diabetes mellitus with unspecified complications: Secondary | ICD-10-CM

## 2016-10-07 DIAGNOSIS — M25532 Pain in left wrist: Secondary | ICD-10-CM

## 2016-10-07 DIAGNOSIS — E230 Hypopituitarism: Secondary | ICD-10-CM

## 2016-10-07 DIAGNOSIS — Z794 Long term (current) use of insulin: Secondary | ICD-10-CM

## 2016-10-07 NOTE — Telephone Encounter (Signed)
Referral to a local or near by endocrinologist

## 2016-10-07 NOTE — Patient Instructions (Addendum)
Thank you for coming in today. Work on arm exercises.  Recheck in 2-3 months.  We will refer to endocrinology.  Continue PT.  We will continue to follow.   Pronator Syndrome Rehab Ask your health care provider which exercises are safe for you. Do exercises exactly as told by your health care provider and adjust them as directed. It is normal to feel mild stretching, pulling, tightness, or discomfort as you do these exercises, but you should stop right away if you feel sudden pain or your pain gets worse.Do not begin these exercises until told by your health care provider. Stretching and range of motion exercises These exercises warm up your muscles and joints and improve the movement and flexibility of your forearm. These exercises also help to relieve pain, numbness, and tingling. Exercise A: Wrist flexion and extension, active  1. Bend your left / right elbow to an "L" shape (about 90 degrees). 2. Bend your wrist so your fingers point downward. 3. Gently bring your wrist up toward the ceiling. 4. Hold this position for __________ seconds. 5. Slowly return to the starting position. Repeat __________ times. Complete this exercise __________ times a day. Exercise B: Forearm rotation, active  1. Stand with your left / right elbow at your side. 2. Bend your left / right elbow to an "L" shape (about 90 degrees). 3. Gently rotate your left / right forearm so your palm faces the floor. 4. Next, gently rotate your forearm so your palm faces the ceiling. 5. Go back and forth between the two rotation motions for __________ seconds. Repeat __________ times. Complete this exercise __________ times a day. Exercise C: Elbow flexion and extension, active  1. Stand with your left / right arm straight at your side. 2. Turn your left / right palm so it faces behind you. 3. Gently bend your left / right elbow so your palm faces the floor. 4. Hold this position for __________ seconds. 5. Slowly return to  the starting position. Repeat __________ times. Complete this exercise __________ times a day. Exercise D: Biceps stretch  1. Stand with your back to a sturdy chair. 2. Rest the back of your left / right hand on the back of the chair. Your elbow should be straight, and your palm should face the ceiling. 3. Slowly take 1-2 steps forward, stopping when you feel a gentle stretch in the top of your forearm or in your biceps. 4. Hold this position for __________ seconds. 5. Slowly return to the starting position. Repeat __________ times. Complete this exercise __________ times a day. Exercise E: Median nerve mobilization for pronator syndrome  1. Stand with your left / right elbow bent to an "L" shape (90 degrees) and your palm facing down. 2. Use your other hand to gently bend your wrist backwards. 3. Gently tilt your head so your left / right ear goes toward your left / right shoulder. As you tilt your head, gently straighten your elbow and allow your wrist to bend forward. 4. Hold this position for __________ seconds. 5. Slowly return to the starting position. Repeat __________ times. Complete this exercise __________ times a day. This information is not intended to replace advice given to you by your health care provider. Make sure you discuss any questions you have with your health care provider. Document Released: 05/17/2005 Document Revised: 01/20/2016 Document Reviewed: 01/26/2015 Elsevier Interactive Patient Education  2017 [employer]
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
## 2016-10-11 ENCOUNTER — Encounter: Payer: BLUE CROSS/BLUE SHIELD | Admitting: Rehabilitative and Restorative Service Providers"

## 2016-10-11 ENCOUNTER — Other Ambulatory Visit: Payer: Self-pay | Admitting: Family Medicine

## 2016-10-12 MED ORDER — TRAZODONE HCL 50 MG PO TABS: 25.0000 mg | ORAL_TABLET | Freq: Every evening | ORAL | 3 refills | Status: DC | PRN

## 2016-10-14 ENCOUNTER — Encounter: Payer: BLUE CROSS/BLUE SHIELD | Admitting: Rehabilitative and Restorative Service Providers"

## 2016-10-22 ENCOUNTER — Encounter: Payer: Self-pay | Admitting: Family Medicine

## 2016-10-22 LAB — COMPREHENSIVE METABOLIC PANEL
Albumin Serum: 3.9
Calcium: 9.1 mg/dL
Carbon Dioxide, Total: 28
Chloride: 103 mmol/L
Total Protein: 7 g/dL

## 2017-01-06 ENCOUNTER — Ambulatory Visit (INDEPENDENT_AMBULATORY_CARE_PROVIDER_SITE_OTHER): Payer: Medicare Other | Admitting: Family Medicine

## 2017-01-06 ENCOUNTER — Encounter: Payer: Self-pay | Admitting: Family Medicine

## 2017-01-06 VITALS — BP 138/86 | HR 76 | Ht 69.0 in | Wt 287.0 lb

## 2017-01-06 DIAGNOSIS — E118 Type 2 diabetes mellitus with unspecified complications: Secondary | ICD-10-CM

## 2017-01-06 DIAGNOSIS — E782 Mixed hyperlipidemia: Secondary | ICD-10-CM

## 2017-01-06 DIAGNOSIS — Z794 Long term (current) use of insulin: Secondary | ICD-10-CM | POA: Diagnosis not present

## 2017-01-06 NOTE — Patient Instructions (Addendum)
Thank you for coming in today. We will continue to follow up with Endocrinology.   I recommend a lower carbohydrate diet. I recommend trying to eat less than 45g of carbs per meal.  A bread roll has about 40g of carbs.   Recheck with me in 6 months or so.     Get the flu shot this fall.

## 2017-01-07 NOTE — Progress Notes (Signed)
Gregory Governor Rooks [name] is a 66 y.o. male who presents to [name]: Primary Care Sports Medicine today for follow-up diabetes hypertension and hyperlipidemia.  Diabetes: Gregory West is also receiving diabetes care with an endocrinologist. He's had several medication changes listed below. He notes most the time his blood sugar is reasonably well controlled. His last A1c was a bit elevated 3 months ago. He denies polyuria or polydipsia. He is not following a low carbohydrate diet.   Hyperlipidemia: Patient was recently switched from Livalo to Crestor. He's had trouble tolerating this medication previously but could not afford Livalo.  So far he is tolerating Crestor just fine   Past Medical History:  Diagnosis Date  . Arthritis   . Chest pain    a. Normal cors 2001. b. Neg stress test 2012, 05/2014.  . Diabetes mellitus without complication (HCC)   . Hyperlipidemia   . Hypertensive response to exercise   . Obesity   . Obstructive sleep apnea    Currently untreated    Past Surgical History:  Procedure Laterality Date  . CERVICAL FUSION    . HERNIA REPAIR    . KNEE ARTHROSCOPY  1984   both knee   Social History  Substance Use Topics  . Smoking status: Never Smoker  . Smokeless tobacco: Never Used  . Alcohol use No   family history includes Cancer in his father; Diabetes in his maternal grandfather, maternal grandmother, mother, and sister; Heart attack in his maternal grandfather, maternal grandmother, mother, paternal grandfather, and paternal grandmother; Heart disease in his father and mother; Hyperlipidemia in his sister; Hypertension in his maternal grandfather, mother, sister, and sister; Stroke in his maternal grandmother and mother.  ROS as above:  Medications: Current Outpatient Prescriptions  Medication Sig Dispense Refill  . ANDROGEL PUMP 20.25 MG/ACT (1.62%) GEL     .  aspirin 81 MG tablet Take 81 mg by mouth daily.    . Cholecalciferol (VITAMIN D3) 1000 units CAPS Take 6,000 Units by mouth daily.    . diclofenac sodium (VOLTAREN) 1 % GEL Apply 4 g topically 4 (four) times daily. To affected joint. 100 g 11  . empagliflozin (JARDIANCE) 25 MG TABS tablet Take 25 mg by mouth daily.    . insulin glargine (LANTUS) 100 UNIT/ML injection Inject 30 Units into the skin 2 (two) times daily.     Marland Kitchen losartan (COZAAR) 50 MG tablet Take 50 mg by mouth daily.     Letta Pate VERIO test strip 1 each by Other route as needed.     . pioglitazone (ACTOS) 15 MG tablet Take 15 mg by mouth daily.     . rosuvastatin (CRESTOR) 5 MG tablet Take 1 tablet by mouth daily.    . traZODone (DESYREL) 50 MG tablet Take 0.5-1 tablets (25-50 mg total) by mouth at bedtime as needed for sleep. 90 tablet 3   No current facility-administered medications for this visit.    Allergies  Allergen Reactions  . Statins     Muscle cramping all over body. Lipitor, Crestor, and pravastatin.  Verdis Prime [Liraglutide] Other (See Comments)    Caused pancreatitis    Health Maintenance Health Maintenance  Topic Date Due  . Hepatitis C Screening  [birth year]  . HIV Screening  11/24/1966  . OPHTHALMOLOGY EXAM  11/29/2015  . PNA vac Low Risk Adult (1 of 2 - PCV13) 11/23/2016  . INFLUENZA VACCINE  12/29/2016  . FOOT EXAM  04/01/2017  .  HEMOGLOBIN A1C  04/07/2017  . TETANUS/TDAP  07/30/2025  . COLONOSCOPY  02/23/2026     Exam:  BP 138/86   Pulse 76   Ht 5\' 9"  (1.753 m)   Wt 287 lb (130.2 kg)   BMI 42.38 kg/m  Gen: Well NAD HEENT: EOMI,  MMM Lungs: Normal work of breathing. CTABL Heart: RRR no MRG Abd: NABS, Soft. Nondistended, Nontender Exts: Brisk capillary refill, warm and well perfused.    No results found for this or any previous visit (from the past 72 hour(s)). No results found.    Assessment and Plan: 66 y.o. male with  Diabetes doing reasonably well. Patient will get A1c with  endocrinology next week. We'll check results.  Hyperlipidemia doing well with Crestor. We'll check lipids in 3-6 months.   No orders of the defined types were placed in this encounter.  Meds ordered this encounter  Medications  . empagliflozin (JARDIANCE) 25 MG TABS tablet    Sig: Take 25 mg by mouth daily.  . rosuvastatin (CRESTOR) 5 MG tablet    Sig: Take 1 tablet by mouth daily.     Discussed warning signs or symptoms. Please see discharge instructions. Patient expresses understanding.

## 2017-01-10 DIAGNOSIS — E782 Mixed hyperlipidemia: Secondary | ICD-10-CM | POA: Diagnosis not present

## 2017-01-10 DIAGNOSIS — E559 Vitamin D deficiency, unspecified: Secondary | ICD-10-CM | POA: Diagnosis not present

## 2017-01-10 DIAGNOSIS — E119 Type 2 diabetes mellitus without complications: Secondary | ICD-10-CM | POA: Diagnosis not present

## 2017-01-10 DIAGNOSIS — E230 Hypopituitarism: Secondary | ICD-10-CM | POA: Diagnosis not present

## 2017-01-10 LAB — HEMOGLOBIN A1C: Hemoglobin A1C: 9

## 2017-01-10 LAB — BASIC METABOLIC PANEL
ALBUMIN: 3.9
ALK PHOS: 61
ALT: 22
ANION GAP: 8
AST: 18
BUN: 7 (ref 4–21)
CALCIUM: 8.5
CHLORIDE: 105
CREATININE: 0.8 (ref 0.6–1.3)
Carbon Dioxide, Total: 28
Glucose: 144
POTASSIUM: 4.3 (ref 3.4–5.3)
Sodium: 141 (ref 137–147)
TOTBILIFLUID: 0.4
Total Protein: 6.4

## 2017-01-10 LAB — LIPID PANEL
CHOLESTEROL: 148 (ref 0–200)
HDL: 40 (ref 35–70)
LDL Cholesterol: 95
Triglycerides: 99 (ref 40–160)

## 2017-01-10 LAB — CBC AND DIFFERENTIAL
HCT: 43 (ref 41–53)
HEMOGLOBIN: 14.5 (ref 13.5–17.5)
Platelets: 126 — AB (ref 150–399)
WBC: 4.2

## 2017-01-13 DIAGNOSIS — I10 Essential (primary) hypertension: Secondary | ICD-10-CM | POA: Diagnosis not present

## 2017-01-13 DIAGNOSIS — Z6841 Body Mass Index (BMI) 40.0 and over, adult: Secondary | ICD-10-CM | POA: Diagnosis not present

## 2017-01-13 DIAGNOSIS — E230 Hypopituitarism: Secondary | ICD-10-CM | POA: Diagnosis not present

## 2017-01-13 DIAGNOSIS — E782 Mixed hyperlipidemia: Secondary | ICD-10-CM | POA: Diagnosis not present

## 2017-01-13 DIAGNOSIS — E119 Type 2 diabetes mellitus without complications: Secondary | ICD-10-CM | POA: Diagnosis not present

## 2017-01-13 DIAGNOSIS — E559 Vitamin D deficiency, unspecified: Secondary | ICD-10-CM | POA: Diagnosis not present

## 2017-01-13 DIAGNOSIS — E669 Obesity, unspecified: Secondary | ICD-10-CM | POA: Diagnosis not present

## 2017-01-19 LAB — HM DIABETES EYE EXAM

## 2017-01-20 DIAGNOSIS — Z87440 Personal history of urinary (tract) infections: Secondary | ICD-10-CM | POA: Diagnosis not present

## 2017-01-20 DIAGNOSIS — N2889 Other specified disorders of kidney and ureter: Secondary | ICD-10-CM | POA: Diagnosis not present

## 2017-01-20 DIAGNOSIS — Z79899 Other long term (current) drug therapy: Secondary | ICD-10-CM | POA: Diagnosis not present

## 2017-01-20 DIAGNOSIS — R1031 Right lower quadrant pain: Secondary | ICD-10-CM | POA: Diagnosis not present

## 2017-01-20 DIAGNOSIS — Z9660 Presence of unspecified orthopedic joint implant: Secondary | ICD-10-CM | POA: Diagnosis not present

## 2017-01-20 DIAGNOSIS — Z87891 Personal history of nicotine dependence: Secondary | ICD-10-CM | POA: Diagnosis not present

## 2017-01-20 DIAGNOSIS — N281 Cyst of kidney, acquired: Secondary | ICD-10-CM | POA: Diagnosis not present

## 2017-01-20 DIAGNOSIS — N289 Disorder of kidney and ureter, unspecified: Secondary | ICD-10-CM | POA: Diagnosis not present

## 2017-01-20 DIAGNOSIS — Z888 Allergy status to other drugs, medicaments and biological substances status: Secondary | ICD-10-CM | POA: Diagnosis not present

## 2017-01-20 DIAGNOSIS — E119 Type 2 diabetes mellitus without complications: Secondary | ICD-10-CM | POA: Diagnosis not present

## 2017-01-20 DIAGNOSIS — E669 Obesity, unspecified: Secondary | ICD-10-CM | POA: Diagnosis not present

## 2017-01-20 DIAGNOSIS — Z794 Long term (current) use of insulin: Secondary | ICD-10-CM | POA: Diagnosis not present

## 2017-01-21 ENCOUNTER — Ambulatory Visit (INDEPENDENT_AMBULATORY_CARE_PROVIDER_SITE_OTHER): Payer: Medicare Other | Admitting: Family Medicine

## 2017-01-21 ENCOUNTER — Encounter: Payer: Self-pay | Admitting: Family Medicine

## 2017-01-21 VITALS — BP 127/78 | HR 71 | Wt 288.0 lb

## 2017-01-21 DIAGNOSIS — N2889 Other specified disorders of kidney and ureter: Secondary | ICD-10-CM | POA: Diagnosis not present

## 2017-01-21 DIAGNOSIS — Z23 Encounter for immunization: Secondary | ICD-10-CM

## 2017-01-21 DIAGNOSIS — S76011A Strain of muscle, fascia and tendon of right hip, initial encounter: Secondary | ICD-10-CM | POA: Diagnosis not present

## 2017-01-21 NOTE — Progress Notes (Signed)
Gregory Governor Rooks [name] is a 66 y.o. male who presents to [facility]: Primary Care Sports Medicine today for hip pain and kidney mass.  Patient notes pain in the right lower quadrant of his abdomen and into the anterior portion of the hip. The pain is worse with hip flexion. He presented to the emergency room last night for evaluation and management and had a CT scan of the abdomen and pelvis that was normal except for a 2 cm right-sided renal mass. He denies any Weight loss fevers chills or night sweats. He feels well otherwise no vomiting or diarrhea. No blood in the urine.   Past Medical History:  Diagnosis Date  . Arthritis   . Chest pain    a. Normal cors 2001. b. Neg stress test 2012, 05/2014.  . Diabetes mellitus without complication (HCC)   . Hyperlipidemia   . Hypertensive response to exercise   . Obesity   . Obstructive sleep apnea    Currently untreated    Past Surgical History:  Procedure Laterality Date  . CERVICAL FUSION    . HERNIA REPAIR    . KNEE ARTHROSCOPY  1984   both knee   Social History  Substance Use Topics  . Smoking status: Never Smoker  . Smokeless tobacco: Never Used  . Alcohol use No   family history includes Cancer in his father; Diabetes in his maternal grandfather, maternal grandmother, mother, and sister; Heart attack in his maternal grandfather, maternal grandmother, mother, paternal grandfather, and paternal grandmother; Heart disease in his father and mother; Hyperlipidemia in his sister; Hypertension in his maternal grandfather, mother, sister, and sister; Stroke in his maternal grandmother and mother.  ROS as above:  Medications: Current Outpatient Prescriptions  Medication Sig Dispense Refill  . ANDROGEL PUMP 20.25 MG/ACT (1.62%) GEL     . aspirin 81 MG tablet Take 81 mg by mouth daily.    . Cholecalciferol (VITAMIN D3) 1000 units CAPS Take  6,000 Units by mouth daily.    . diclofenac sodium (VOLTAREN) 1 % GEL Apply 4 g topically 4 (four) times daily. To affected joint. 100 g 11  . empagliflozin (JARDIANCE) 25 MG TABS tablet Take 25 mg by mouth daily.    Marland Kitchen HYDROcodone-acetaminophen (NORCO/VICODIN) 5-325 MG tablet Take by mouth.    . insulin glargine (LANTUS) 100 UNIT/ML injection Inject 30 Units into the skin 2 (two) times daily.     Marland Kitchen losartan (COZAAR) 50 MG tablet Take 50 mg by mouth daily.     . metFORMIN (GLUCOPHAGE-XR) 500 MG 24 hr tablet TK 2 TS PO WITH BRE AND 1 T WITH DINNER FOR DIABETES  3  . omega-3 acid ethyl esters (LOVAZA) 1 g capsule Take by mouth 2 (two) times daily.    Gregory West VERIO test strip 1 each by Other route as needed.     . pioglitazone (ACTOS) 15 MG tablet Take 15 mg by mouth daily.     . rosuvastatin (CRESTOR) 5 MG tablet Take 1 tablet by mouth daily.    . traZODone (DESYREL) 50 MG tablet Take 0.5-1 tablets (25-50 mg total) by mouth at bedtime as needed for sleep. 90 tablet 3   No current facility-administered medications for this visit.    Allergies  Allergen Reactions  . Statins     Muscle cramping all over body. Lipitor, Crestor, and pravastatin.  Gregory West [Liraglutide] Other (See Comments)    Caused pancreatitis    Health Maintenance  Health Maintenance  Topic Date Due  . Hepatitis C Screening  Apr 12, 1951  . HIV Screening  11/24/1966  . OPHTHALMOLOGY EXAM  11/29/2015  . PNA vac Low Risk Adult (1 of 2 - PCV13) 11/23/2016  . INFLUENZA VACCINE  12/29/2016  . FOOT EXAM  04/01/2017  . HEMOGLOBIN A1C  04/07/2017  . TETANUS/TDAP  07/30/2025  . COLONOSCOPY  02/23/2026     Exam:  BP 127/78   Pulse 71   Wt 288 lb (130.6 kg)   SpO2 96%   BMI 42.53 kg/m  Gen: Well NAD HEENT: EOMI,  MMM Lungs: Normal work of breathing. CTABL Heart: RRR no MRG Abd: NABS, Soft. Nondistended, Nontender No CVA angle tenderness to percussion  Exts: Brisk capillary refill, warm and well perfused.  MSK:  Tender to palpation right anterior hip at the ASIS. Pain is present with resisted hip flexion.   No results found for this or any previous visit (from the past 72 hour(s)). No results found.  CT ABDOMEN PELVIS W IV CONTRAST  Narrative:  INDICATION: RLQ abdominal pain. COMPARISON: None.  TECHNIQUE: CT ABDOMEN PELVIS W IV CONTRAST - 100 cc Isovue-370 Radiation dose reduction was utilized (automated exposure control, mA or kV adjustment based on patient size, or iterative image reconstruction).  FINDINGS:   VISUALIZED LOWER THORAX: No acute abnormalities.  SOLID VISCERA: Liver: Unremarkable. Gallbladder: Unremarkable. Pancreas: Unremarkable. Adrenal glands: Unremarkable. Spleen: Unremarkable. Kidneys: The kidneys are normal size. There is a rounded hyperdense lesion within the medial aspect of the lower pole right kidney measuring approximately 2.0 cm in diameter. Central density measurements measure approximately 47 HU. Lesion is  indeterminate. Small cyst lower pole left kidney. No renal or ureteral calculi. No hydronephrosis or perinephric collections.  GI: The opacified bowel is normal caliber. No mass, obstruction or inflammatory changes. Normal appendix.  PERITONEAL CAVITY/RETROPERITONEUM: No free fluid. No pneumoperitoneum. No lymphadenopathy. Aorta, IVC, iliac arteries, and major visceral arteries are grossly normal.  PELVIS: No acute abnormalities.  MUSCULOSKELETAL: Lumbar spondylosis. No acute fracture.  MISC: N/A.  Impression:  IMPRESSION: 1. No definite acute findings appreciated within the abdomen or pelvis. 2. There is a 2.0 cm indeterminate lesion lower pole right kidney. Consider dedicated renal mass protocol CT or MRI.  Electronically Signed by: Gregory West     Creatinine  0.89   Assessment and Plan: 66 y.o. male with  Right hip pain likely strain of hip flexors. Plan for watchful waiting with Tylenol. Recheck next week.  Right renal mass  seen on CT scan. Plan for a dedicated MRI of the abdomen tomorrow to evaluate the renal mass. Will call patient with results.   Orders Placed This Encounter  Procedures  . MR Abdomen W Wo Contrast    Standing Status:   Future    Standing Expiration Date:   03/23/2018    Order Specific Question:   If indicated for the ordered procedure, I authorize the administration of contrast media per Radiology protocol    Answer:   Yes    Order Specific Question:   What is the patient's sedation requirement?    Answer:   No Sedation    Order Specific Question:   Does the patient have a pacemaker or implanted devices?    Answer:   No    Order Specific Question:   Call Results- Best Contact Number?    Answer:   [phone]    Order Specific Question:   Radiology Contrast Protocol - do NOT remove file path  Answer:   [email or web address]    Order Specific Question:   Reason for Exam additional comments    Answer:   Eval right renal mass seen on CT abd    Order Specific Question:   Preferred imaging location?    Answer:   Licensed conveyancer (table limit-350lbs)  . Flu vaccine HIGH DOSE PF  . Pneumococcal conjugate vaccine 13-valent   Meds ordered this encounter  Medications  . HYDROcodone-acetaminophen (NORCO/VICODIN) 5-325 MG tablet    Sig: Take by mouth.  . metFORMIN (GLUCOPHAGE-XR) 500 MG 24 hr tablet    Sig: TK 2 TS PO WITH BRE AND 1 T WITH DINNER FOR DIABETES    Refill:  3  . omega-3 acid ethyl esters (LOVAZA) 1 g capsule    Sig: Take by mouth 2 (two) times daily.   Pneumococcal pneumonia vaccine and influenza vaccine given.  Discussed warning signs or symptoms. Please see discharge instructions. Patient expresses understanding.  I spent 25 minutes with this patient, greater than 50% was face-to-face time counseling regarding differential diagnosis of renal mass plan and options.Marland Kitchen

## 2017-01-21 NOTE — Patient Instructions (Signed)
Thank you for coming in today. You should hear about the MRI soon.  Call me if you run into trouble. [phone]  We will likely recheck after MRI results in person.   Renal Mass A renal mass is a growth in the kidney. Some masses are harmful and may cause cancer. Others are harmless. A renal mass may be solid or filled with fluid. Those that are filled with fluid are called cysts. What are the causes? Usually, the cause of a renal mass is unknown. However, certain types of cancers and infections can cause a renal mass. What are the signs or symptoms? Symptoms may include:  Blood in the urine.  Pain in the side or back (flank pain).  Feeling full soon after eating.  Weight loss.  Swelling in the abdomen.  Some renal masses do not cause symptoms. How is this diagnosed? A renal mass may be found with a CT scan, ultrasound, or MRI of your abdomen. How is this treated? Treatment will depend on the type of renal mass.  If the renal mass is a cyst that is not causing problems, you will not need treatment.  If the renal mass is a cyst that is causing problems, it may need to be drained during a type of surgery called laparoscopic surgery.  If the renal mass is solid, it may need to be removed with a surgery to your abdomen.  If the renal mass is caused by kidney cancer, you may need surgery to remove all or part of your kidney.  You may need to see your health care provider once or twice a year to have CT scans and ultrasounds done. Having these tests will allow your health care provider to see if your renal mass has changed or gotten bigger. Follow these instructions at home: What you need to do at home will depend on the type of renal mass that you have. The treatment you had also will make a difference. Follow the instructions your health care provider gives you. In general:  Keep all follow-up visits as directed by your health care provider.  Take medicines only as directed  by your health care provider.  Contact a health care provider if:  You have abdominal pain.  You have flank pain.  You have a fever. Get help right away if:  Your pain gets worse.  There is blood in your urine.  You cannot urinate.  You have chest pain.  You have trouble breathing. This information is not intended to replace advice given to you by your health care provider. Make sure you discuss any questions you have with your health care provider. Document Released: 12/12/2013 Document Revised: 10/21/2015 Document Reviewed: 07/11/2013 Elsevier Interactive Patient Education  [employer].

## 2017-01-22 ENCOUNTER — Ambulatory Visit (HOSPITAL_BASED_OUTPATIENT_CLINIC_OR_DEPARTMENT_OTHER)
Admission: RE | Admit: 2017-01-22 | Discharge: 2017-01-22 | Disposition: A | Discharge status: Home / Self Care | Payer: Medicare Other | Source: Ambulatory Visit | Attending: Family Medicine | Admitting: Family Medicine

## 2017-01-22 DIAGNOSIS — K760 Fatty (change of) liver, not elsewhere classified: Secondary | ICD-10-CM | POA: Insufficient documentation

## 2017-01-22 DIAGNOSIS — N281 Cyst of kidney, acquired: Secondary | ICD-10-CM | POA: Insufficient documentation

## 2017-01-22 DIAGNOSIS — N2889 Other specified disorders of kidney and ureter: Secondary | ICD-10-CM

## 2017-01-22 DIAGNOSIS — Z23 Encounter for immunization: Secondary | ICD-10-CM

## 2017-01-22 MED ORDER — GADOBENATE DIMEGLUMINE 529 MG/ML IV SOLN
20.0000 mL | Freq: Once | INTRAVENOUS | Status: AC | PRN
  Administered 2017-01-22: 20 mL via INTRAVENOUS

## 2017-01-24 ENCOUNTER — Encounter: Payer: Self-pay | Admitting: Family Medicine

## 2017-01-24 ENCOUNTER — Telehealth: Payer: Self-pay | Admitting: Family Medicine

## 2017-01-24 DIAGNOSIS — N281 Cyst of kidney, acquired: Secondary | ICD-10-CM | POA: Insufficient documentation

## 2017-01-24 NOTE — Telephone Encounter (Signed)
Pt called requesting results for his MRI

## 2017-01-25 NOTE — Telephone Encounter (Signed)
Attempted to call cell # [phone] not set up.

## 2017-02-02 ENCOUNTER — Encounter: Payer: Self-pay | Admitting: Family Medicine

## 2017-03-08 ENCOUNTER — Ambulatory Visit (INDEPENDENT_AMBULATORY_CARE_PROVIDER_SITE_OTHER): Payer: Medicare Other

## 2017-03-08 ENCOUNTER — Other Ambulatory Visit: Payer: Self-pay | Admitting: Physician Assistant

## 2017-03-08 ENCOUNTER — Ambulatory Visit (INDEPENDENT_AMBULATORY_CARE_PROVIDER_SITE_OTHER): Payer: Medicare Other | Admitting: Physician Assistant

## 2017-03-08 ENCOUNTER — Encounter: Payer: Self-pay | Admitting: Physician Assistant

## 2017-03-08 VITALS — BP 121/66 | HR 77 | Temp 98.3°F | Ht 69.0 in | Wt 284.0 lb

## 2017-03-08 DIAGNOSIS — N50811 Right testicular pain: Secondary | ICD-10-CM

## 2017-03-08 DIAGNOSIS — N451 Epididymitis: Secondary | ICD-10-CM | POA: Diagnosis not present

## 2017-03-08 DIAGNOSIS — N5089 Other specified disorders of the male genital organs: Secondary | ICD-10-CM | POA: Diagnosis not present

## 2017-03-08 DIAGNOSIS — M545 Low back pain, unspecified: Secondary | ICD-10-CM

## 2017-03-08 DIAGNOSIS — N50812 Left testicular pain: Secondary | ICD-10-CM | POA: Diagnosis not present

## 2017-03-08 DIAGNOSIS — N453 Epididymo-orchitis: Secondary | ICD-10-CM

## 2017-03-08 DIAGNOSIS — N5082 Scrotal pain: Secondary | ICD-10-CM | POA: Diagnosis not present

## 2017-03-08 DIAGNOSIS — N452 Orchitis: Secondary | ICD-10-CM | POA: Diagnosis not present

## 2017-03-08 DIAGNOSIS — N50819 Testicular pain, unspecified: Secondary | ICD-10-CM | POA: Diagnosis not present

## 2017-03-08 LAB — CBC WITH DIFFERENTIAL/PLATELET
Basophils Absolute: 20 cells/uL (ref 0–200)
Basophils Relative: 0.4 %
EOS PCT: 2.2 %
Eosinophils Absolute: 110 cells/uL (ref 15–500)
HCT: 43.5 % (ref 38.5–50.0)
Hemoglobin: 15 g/dL (ref 13.2–17.1)
Lymphs Abs: 1720 cells/uL (ref 850–3900)
MCH: 31.3 pg (ref 27.0–33.0)
MCHC: 34.5 g/dL (ref 32.0–36.0)
MCV: 90.8 fL (ref 80.0–100.0)
MONOS PCT: 10.9 %
MPV: 9.8 fL (ref 7.5–12.5)
NEUTROS PCT: 52.1 %
Neutro Abs: 2605 cells/uL (ref 1500–7800)
PLATELETS: 167 10*3/uL (ref 140–400)
RBC: 4.79 10*6/uL (ref 4.20–5.80)
RDW: 13.1 % (ref 11.0–15.0)
TOTAL LYMPHOCYTE: 34.4 %
WBC mixed population: 545 cells/uL (ref 200–950)
WBC: 5 10*3/uL (ref 3.8–10.8)

## 2017-03-08 LAB — POCT URINALYSIS DIPSTICK
Bilirubin, UA: NEGATIVE
Glucose, UA: 500
Ketones, UA: NEGATIVE
Leukocytes, UA: NEGATIVE
Nitrite, UA: NEGATIVE
PROTEIN UA: NEGATIVE
RBC UA: NEGATIVE
SPEC GRAV UA: 1.015 (ref 1.010–1.025)
UROBILINOGEN UA: 0.2 U/dL
pH, UA: 7 (ref 5.0–8.0)

## 2017-03-08 LAB — BASIC METABOLIC PANEL WITH GFR
BUN: 8 mg/dL (ref 7–25)
CO2: 31 mmol/L (ref 20–32)
CREATININE: 0.74 mg/dL (ref 0.70–1.25)
Calcium: 8.8 mg/dL (ref 8.6–10.3)
Chloride: 102 mmol/L (ref 98–110)
GFR, EST NON AFRICAN AMERICAN: 97 mL/min/{1.73_m2} (ref >=60)
GFR, Est African American: 112 mL/min/{1.73_m2} (ref >=60)
GLUCOSE: 104 mg/dL — AB (ref 65–99)
Potassium: 4 mmol/L (ref 3.5–5.3)
SODIUM: 138 mmol/L (ref 135–146)

## 2017-03-08 MED ORDER — LEVOFLOXACIN 500 MG PO TABS: 500.0000 mg | ORAL_TABLET | Freq: Every day | ORAL | 0 refills | Status: DC

## 2017-03-08 NOTE — Progress Notes (Signed)
Subjective:    Patient ID: Gregory Ready [name], male    DOB: 12-Apr-1951, 66 y.o.   MRN: [id number]  HPI  Pt is a 66 yo pleasant male with DM type II who presents to the clinic with bilateral testicular swelling and pain that has been present for 3 days. Last Wednesday his right testicle did feel a little swollen and tender but quickly resolved. Sunday, 3 days ago, suddenly both testicles were swollen and very tender. He is having a lot of lower abdomen pressure and low back pain. He does admit his urine flow has decreased over the last week. Denies any fever, chills, n/v/d. He has been using hot compresses. Rate pain 8/10 today. Denies any penile discharge. When he was 66 years old he had similar symptoms and was treated for UTI and symptoms cleared. Pt has hx of inguinal hernia but denies any pressure or pain with lifting or coughing.   .. Active Ambulatory Problems    Diagnosis Date Noted  . Type II diabetes mellitus with complication (HCC) 05/15/2014  . Obstructive sleep apnea 05/15/2014  . Hyperlipidemia 05/15/2014  . Chronic back pain 03/04/2015  . 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 Vitamin D deficiency 03/06/2015  . History of pancreatitis 03/06/2015  . Glaucoma suspect 10/21/2011  . Carpal tunnel syndrome 06/04/2015  . BPH (benign prostatic hyperplasia) 07/31/2015  . Anxiety state 10/02/2015  . Diverticulosis of colon without hemorrhage 02/26/2016  . Seborrheic keratoses 04/01/2016  . Strain of flexor muscle of right hip 01/21/2017  . Kidney cyst, acquired 01/24/2017  . Orchitis, epididymitis, and epididymo-orchitis 03/08/2017   Resolved Ambulatory Problems    Diagnosis Date Noted  . Chest discomfort 05/15/2014  . SOB (shortness of breath) on exertion 06/04/2015  . Acute bronchitis 06/13/2015   Past Medical History:  Diagnosis Date  . Arthritis   . Chest pain   . Diabetes mellitus without complication (HCC)   . Hyperlipidemia   . Hypertensive  response to exercise   . Obesity   . Obstructive sleep apnea    .Marland Kitchen Family History  Problem Relation Age of Onset  . Diabetes Mother   . Heart disease Mother   . Hypertension Mother   . Stroke Mother   . Cancer Father   . Heart disease Father   . Hyperlipidemia Sister   . Hypertension Sister   . Diabetes Maternal Grandmother   . Stroke Maternal Grandmother   . Diabetes Maternal Grandfather   . Hypertension Maternal Grandfather   . Diabetes Sister   . Hypertension Sister   . Heart attack Mother   . Heart attack Maternal Grandmother   . Heart attack Maternal Grandfather   . Heart attack Paternal Grandmother   . Heart attack Paternal Grandfather      Review of Systems See HPI.     Objective:   Physical Exam  Constitutional: He appears well-developed and well-nourished.  Cardiovascular: Normal rate, regular rhythm and normal heart sounds.   Pulmonary/Chest: Effort normal and breath sounds normal.  Abdominal: He exhibits no mass.  Moderate tenderness over suprapubic, right and left lower abdomen. No rebound or guarding.  No other abdominal tenderness.    Genitourinary:             Assessment & Plan:  Marland KitchenMarland KitchenDiagnoses and all orders for this visit:  Orchitis, epididymitis, and epididymo-orchitis -     levofloxacin (LEVAQUIN) 500 MG tablet; Take 1 tablet (500 mg total) by mouth daily.  Acute midline low back pain without  sciatica -     POCT urinalysis dipstick -     Urine Culture  Testicular swelling -     CBC with Differential/Platelet -     BASIC METABOLIC PANEL WITH GFR -     [place] SCROTOM DOPPLER -     Urine Culture  Testicular pain -     CBC with Differential/Platelet -     BASIC METABOLIC PANEL WITH GFR -     [place] SCROTOM DOPPLER -     Urine Culture   .Marland Kitchen Results for orders placed or performed in visit on 03/08/17  CBC with Differential/Platelet  Result Value Ref Range   WBC 5.0 3.8 - 10.8 Thousand/uL   RBC 4.79 4.20 - 5.80 Million/uL   Hemoglobin  15.0 13.2 - 17.1 g/dL   HCT 13.2 44.0 - 10.2 %   MCV 90.8 80.0 - 100.0 fL   MCH 31.3 27.0 - 33.0 pg   MCHC 34.5 32.0 - 36.0 g/dL   RDW 72.5 36.6 - 44.0 %   Platelets 167 140 - 400 Thousand/uL   MPV 9.8 7.5 - 12.5 fL   Neutro Abs 2,605 1,500 - 7,800 cells/uL   Lymphs Abs 1,720 850 - 3,900 cells/uL   WBC mixed population 545 200 - 950 cells/uL   Eosinophils Absolute 110 15 - 500 cells/uL   Basophils Absolute 20 0 - 200 cells/uL   Neutrophils Relative % 52.1 %   Total Lymphocyte 34.4 %   Monocytes Relative 10.9 %   Eosinophils Relative 2.2 %   Basophils Relative 0.4 %  BASIC METABOLIC PANEL WITH GFR  Result Value Ref Range   Glucose, Bld 104 (H) 65 - 99 mg/dL   BUN 8 7 - 25 mg/dL   Creat 3.47 4.25 - 9.56 mg/dL   GFR, Est Non African American 97 > OR = 60 mL/min/1.64m2   GFR, Est African American 112 > OR = 60 mL/min/1.60m2   BUN/Creatinine Ratio NOT APPLICABLE 6 - 22 (calc)   Sodium 138 135 - 146 mmol/L   Potassium 4.0 3.5 - 5.3 mmol/L   Chloride 102 98 - 110 mmol/L   CO2 31 20 - 32 mmol/L   Calcium 8.8 8.6 - 10.3 mg/dL  POCT urinalysis dipstick  Result Value Ref Range   Color, UA yellow    Clarity, UA clear    Glucose, UA 500    Bilirubin, UA neg    Ketones, UA neg    Spec Grav, UA 1.015 1.010 - 1.025   Blood, UA neg    pH, UA 7.0 5.0 - 8.0   Protein, UA neg    Urobilinogen, UA 0.2 0.2 or 1.0 E.U./dL   Nitrite, UA neg    Leukocytes, UA Negative Negative   UA dipstick is normal except for glucose in urine. Pt is DM and he admits to drinking a soda before came to appt.   [place] stat results: Findings consistent with bilateral epididymo-orchitis.doppler ruled out testicular torsion although unlikely since pain present for 3 days.   Treated with levaquin for 10 days. CBC/BMP ordered for reassurance that kidney function looks good and that WBC is not out of range.  Encouraged cool compresses. Pain relief with ibuprofen/tylenol. Offered a few tablets of norco and patient  declined.  Pt did complain of some enlarging prostate symptoms.  Advised patient to follow up with PCP in 10 days to make sure symptoms resolving and to follow up on potential BPH.

## 2017-03-08 NOTE — Progress Notes (Signed)
Please culture.

## 2017-03-08 NOTE — Progress Notes (Signed)
Findings consisent with bilateral ependymitis orchitis.start levaquin for next 10 days. Follow up in 1 week with PCP if not improving or sooner if worsening. Use cool compresses over testicles. Ibuprofen can help with pain. Would you like a small quanity of norco for next 2 days while abx getting into system. Will need to pick up at front desk if so let me know.

## 2017-03-09 LAB — URINE CULTURE
MICRO NUMBER:: 81123703
RESULT: NO GROWTH
SPECIMEN QUALITY: ADEQUATE

## 2017-03-10 ENCOUNTER — Ambulatory Visit: Payer: No Typology Code available for payment source | Admitting: Family Medicine

## 2017-06-28 DIAGNOSIS — E230 Hypopituitarism: Secondary | ICD-10-CM | POA: Diagnosis not present

## 2017-06-28 DIAGNOSIS — E119 Type 2 diabetes mellitus without complications: Secondary | ICD-10-CM | POA: Diagnosis not present

## 2017-06-28 DIAGNOSIS — E782 Mixed hyperlipidemia: Secondary | ICD-10-CM | POA: Diagnosis not present

## 2017-06-28 LAB — HEPATIC FUNCTION PANEL
ALT: 22 (ref 10–40)
AST: 20 (ref 14–40)
Alkaline Phosphatase: 60 (ref 25–125)
BILIRUBIN, TOTAL: 0.3

## 2017-06-28 LAB — BASIC METABOLIC PANEL
BUN: 10 (ref 4–21)
CREATININE: 0.9 (ref 0.6–1.3)
Glucose: 99
POTASSIUM: 4.2 (ref 3.4–5.3)
Sodium: 141 (ref 137–147)

## 2017-06-28 LAB — LIPID PANEL
CHOLESTEROL: 155 (ref 0–200)
HDL: 42 (ref 35–70)
LDL Cholesterol: 92
LDL/HDL RATIO: 3.7
TRIGLYCERIDES: 90 (ref 40–160)

## 2017-06-28 LAB — HEMOGLOBIN A1C: HEMOGLOBIN A1C: 8.2

## 2017-06-30 DIAGNOSIS — I10 Essential (primary) hypertension: Secondary | ICD-10-CM | POA: Diagnosis not present

## 2017-06-30 DIAGNOSIS — E1169 Type 2 diabetes mellitus with other specified complication: Secondary | ICD-10-CM | POA: Diagnosis not present

## 2017-06-30 DIAGNOSIS — Z9989 Dependence on other enabling machines and devices: Secondary | ICD-10-CM | POA: Diagnosis not present

## 2017-06-30 DIAGNOSIS — E782 Mixed hyperlipidemia: Secondary | ICD-10-CM | POA: Diagnosis not present

## 2017-06-30 DIAGNOSIS — E669 Obesity, unspecified: Secondary | ICD-10-CM | POA: Diagnosis not present

## 2017-06-30 DIAGNOSIS — E559 Vitamin D deficiency, unspecified: Secondary | ICD-10-CM | POA: Diagnosis not present

## 2017-06-30 DIAGNOSIS — G4733 Obstructive sleep apnea (adult) (pediatric): Secondary | ICD-10-CM | POA: Diagnosis not present

## 2017-06-30 DIAGNOSIS — Z6841 Body Mass Index (BMI) 40.0 and over, adult: Secondary | ICD-10-CM | POA: Diagnosis not present

## 2017-06-30 DIAGNOSIS — Z794 Long term (current) use of insulin: Secondary | ICD-10-CM | POA: Diagnosis not present

## 2017-06-30 DIAGNOSIS — E230 Hypopituitarism: Secondary | ICD-10-CM | POA: Diagnosis not present

## 2017-07-11 ENCOUNTER — Ambulatory Visit: Payer: Medicare Other | Admitting: Family Medicine

## 2017-07-12 ENCOUNTER — Encounter: Payer: Self-pay | Admitting: Family Medicine

## 2017-07-12 ENCOUNTER — Ambulatory Visit (INDEPENDENT_AMBULATORY_CARE_PROVIDER_SITE_OTHER): Payer: Medicare Other | Admitting: Family Medicine

## 2017-07-12 VITALS — BP 134/87 | HR 87 | Ht 70.0 in | Wt 287.0 lb

## 2017-07-12 DIAGNOSIS — G250 Essential tremor: Secondary | ICD-10-CM | POA: Diagnosis not present

## 2017-07-12 DIAGNOSIS — E1159 Type 2 diabetes mellitus with other circulatory complications: Secondary | ICD-10-CM

## 2017-07-12 DIAGNOSIS — G5632 Lesion of radial nerve, left upper limb: Secondary | ICD-10-CM | POA: Diagnosis not present

## 2017-07-12 DIAGNOSIS — I152 Hypertension secondary to endocrine disorders: Secondary | ICD-10-CM

## 2017-07-12 DIAGNOSIS — I10 Essential (primary) hypertension: Secondary | ICD-10-CM

## 2017-07-12 MED ORDER — LOSARTAN POTASSIUM 25 MG PO TABS: 25.0000 mg | ORAL_TABLET | Freq: Every day | ORAL | 1 refills | Status: DC

## 2017-07-12 MED ORDER — PROPRANOLOL HCL ER 60 MG PO CP24: 60.0000 mg | ORAL_CAPSULE | Freq: Every day | ORAL | 1 refills | Status: DC

## 2017-07-12 NOTE — Progress Notes (Signed)
Gregory Governor Rooks [name] is a 67 y.o. male who presents to [facility]: Primary Care Sports Medicine today for follow up HTN and discuss hand tremor and left arm pain.  HTN: Gregory West takes losartan 50mg  every other day. He was advised to cut them in half and take 25mg  daily but he has trouble cutting them in half. He feels well with no chest pain palpitations shortness of breath lightheadedness or dizziness.  Bilateral hand tremor.  Gregory West notes bilateral feet hand tremor worse with motion better with rest present for months.  He notes it interferes with handwriting and drinking.  He feels well with no fevers or chills vomiting or diarrhea.  No weakness or numbness or loss of function.  Left forearm pain.  Gregory West notes pain in the left forearm present for months.  The pain is stabbing occurring off and on.  The pain radiates from the near the lateral epicondyle to the thumb.  He denies any pain with neck motion.  He notes he has a history of several neck fusions.   Past Medical History:  Diagnosis Date  . Arthritis   . Chest pain    a. Normal cors 2001. b. Neg stress test 2012, 05/2014.  . Diabetes mellitus without complication (HCC)   . Hyperlipidemia   . Hypertensive response to exercise   . Obesity   . Obstructive sleep apnea    Currently untreated    Past Surgical History:  Procedure Laterality Date  . CERVICAL FUSION    . HERNIA REPAIR    . KNEE ARTHROSCOPY  1984   both knee   Social History   Tobacco Use  . Smoking status: Never Smoker  . Smokeless tobacco: Never Used  Substance Use Topics  . Alcohol use: No   family history includes Cancer in his father; Diabetes in his maternal grandfather, maternal grandmother, mother, and sister; Heart attack in his maternal grandfather, maternal grandmother, mother, paternal grandfather, and paternal grandmother; Heart disease in his father  and mother; Hyperlipidemia in his sister; Hypertension in his maternal grandfather, mother, sister, and sister; Stroke in his maternal grandmother and mother.  ROS as above:  Medications: Current Outpatient Medications  Medication Sig Dispense Refill  . aspirin 81 MG tablet Take 81 mg by mouth daily.    . Cholecalciferol (VITAMIN D3) 1000 units CAPS Take 6,000 Units by mouth daily.    . empagliflozin (JARDIANCE) 25 MG TABS tablet Take 25 mg by mouth daily.    . insulin glargine (LANTUS) 100 UNIT/ML injection Inject 30 Units into the skin 2 (two) times daily.     . metFORMIN (GLUCOPHAGE-XR) 500 MG 24 hr tablet TK 2 TS PO WITH BRE AND 1 T WITH DINNER FOR DIABETES  3  . ONETOUCH VERIO test strip 1 each by Other route as needed.     . pioglitazone (ACTOS) 15 MG tablet Take 15 mg by mouth daily.     . rosuvastatin (CRESTOR) 5 MG tablet Take 1 tablet by mouth daily.    . tamsulosin (FLOMAX) 0.4 MG CAPS capsule Take 1 tablet once a day for prostate    . traZODone (DESYREL) 50 MG tablet Take 0.5-1 tablets (25-50 mg total) by mouth at bedtime as needed for sleep. 90 tablet 3  . cholecalciferol (VITAMIN D) 1000 units tablet Take by mouth.    . losartan (COZAAR) 25 MG tablet Take 1 tablet (25 mg total) by mouth daily. 90 tablet 1  . propranolol  ER (INDERAL LA) 60 MG 24 hr capsule Take 1 capsule (60 mg total) by mouth daily. 90 capsule 1   No current facility-administered medications for this visit.    Allergies  Allergen Reactions  . Statins     Muscle cramping all over body. Lipitor, Crestor, and pravastatin.  . Victoza [LirVerdis Primeaglutide] Other (See Comments)    Caused pancreatitis    Health Maintenance Health Maintenance  Topic Date Due  . Hepatitis C Screening  [birth year]  . HIV Screening  11/24/1965  . OPHTHALMOLOGY EXAM  11/29/2015  . HEMOGLOBIN A1C  07/13/2017  . FOOT EXAM  07/12/2018  . PNA vac Low Risk Adult (2 of 2 - PPSV23) 07/30/2020  . TETANUS/TDAP  07/30/2025  . COLONOSCOPY   02/23/2026  . INFLUENZA VACCINE  Completed     Exam:  BP 134/87   Pulse 87   Ht 5\' 10"  (1.778 m)   Wt 287 lb (130.2 kg)   BMI 41.18 kg/m  Gen: Well NAD HEENT: EOMI,  MMM Lungs: Normal work of breathing. CTABL Heart: RRR no MRG Abd: NABS, Soft. Nondistended, Nontender Exts: Brisk capillary refill, warm and well perfused.  MSK: C-spine nontender decreased motion negative Spurling's test Left arm normal-appearing tender to palpation at the lateral epicondyle.  Minimal pain with resisted wrist extension. Slight weakness to first digit extension strength 4+/5 Pulses capillary refill and sensation intact. Neuro: Alert and oriented normal coordination balance and gait.  Very fine tremor bilateral hands with motion.    No results found for this or any previous visit (from the past 72 hour(s)). No results found.    Assessment and Plan: 67 y.o. male with  Hypertension: Reasonably well controlled however 50 mg of losartan every other day is not ideal.  Will simplify to 25 mg daily and recheck in 1-3 months. Labs obtained by endocrinology will be abstracted.  Hand tremor: Likely essential tremor.  Trial of low-dose propranolol.  Recheck in a month or so.  Left forearm pain: Etiology is unclear.  This could be sixth cervical nerve root radiculopathy or lateral epicondylitis or radial tunnel syndrome.  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  Work on stretching and strengthening exercises and recheck in a month or so.   No orders of the defined types were placed in this encounter.  Meds ordered this encounter  Medications  . losartan (COZAAR) 25 MG tablet    Sig: Take 1 tablet (25 mg total) by mouth daily.    Dispense:  90 tablet    Refill:  1  . propranolol ER (INDERAL LA) 60 MG 24 hr capsule    Sig: Take 1 capsule (60 mg total) by mouth daily.    Dispense:  90 capsule    Refill:  1     Discussed warning signs or symptoms. Please see discharge instructions. Patient  expresses understanding.

## 2017-07-12 NOTE — Patient Instructions (Addendum)
Thank you for coming in today. STOP losartan 50 every other day and start losartan 25 daily.  Do the exercises and stretching for tennis elbow.  I am concerned for Radial Tunnel Syndrome.   I think you have essential tremor.  Take propranolol daily.  Recheck in 1-3 months.  Return sooner if not doing well.   Essential Tremor A tremor is trembling or shaking that you cannot control. Most tremors affect the hands or arms. Tremors can also affect the head, vocal cords, face, and other parts of the body. Essential tremor is a tremor without a known cause. What are the causes? Essential tremor has no known cause. What increases the risk? You may be at greater risk of essential tremor if:  You have a family member with essential tremor.  You are age 66 or older.  You take certain medicines.  What are the signs or symptoms? The main sign of a tremor is uncontrolled and unintentional rhythmic shaking of a body part.  You may have difficulty eating with a spoon or fork.  You may have difficulty writing.  You may nod your head up and down or side to side.  You may have a quivering voice.  Your tremors:  May get worse over time.  May come and go.  May be more noticeable on one side of your body.  May get worse due to stress, fatigue, caffeine, and extreme heat or cold.  How is this diagnosed? Your health care provider can diagnose essential tremor based on your symptoms, medical history, and a physical examination. There is no single test to diagnose an essential tremor. However, your health care provider may perform a variety of tests to rule out other conditions. Tests may include:  Blood and urine tests.  Imaging studies of your brain, such as: ? CT scan. ? MRI.  A test that measures involuntary muscle movement (electromyogram).  How is this treated? Your tremors may go away without treatment. Mild tremors may not need treatment if they do not affect your day-to-day  life. Severe tremors may need to be treated using one or a combination of the following options:  Medicines. This may include medicine that is injected.  Lifestyle changes.  Physical therapy.  Follow these instructions at home:  Take medicines only as directed by your health care provider.  Limit alcohol intake to no more than 1 drink per day for nonpregnant women and 2 drinks per day for men. One drink equals 12 oz of beer, 5 oz of wine, or 1 oz of hard liquor.  Do not use any tobacco products, including cigarettes, chewing tobacco, or electronic cigarettes. If you need help quitting, ask your health care provider.  Take medicines only as directed by your health care provider.  Avoid extreme heat or cold.  Limit the amount of caffeine you consumeas directed by your health care provider.  Try to get eight hours of sleep each night.  Find ways to manage your stress, such as meditation or yoga.  Keep all follow-up visits as directed by your health care provider. This is important. This includes any physical therapy visits. Contact a health care provider if:  You experience any changes in the location or intensity of your tremors.  You start having a tremor after starting a new medicine.  You have tremor with other symptoms such as: ? Numbness. ? Tingling. ? Pain. ? Weakness.  Your tremor gets worse.  Your tremor interferes with your daily life. This information  is not intended to replace advice given to you by your health care provider. Make sure you discuss any questions you have with your health care provider. Document Released: 06/07/2014 Document Revised: 10/23/2015 Document Reviewed: 11/12/2013 Elsevier Interactive Patient Education  2018 Else[employer]   Radial Tunnel Syndrome  Radial tunnel syndrome happens when the nerve that extends from the back of your upper arm to your forearm (radial nerve) gets squeezed (compressed). The condition is usually caused by  inflammation, an injury, or a tumor that puts pressure on the nerve and traps it. Radial tunnel syndrome can cause stabbing pain in the hand and arm. What are the causes? This condition may be caused by:  Repeatedly using your forearm too much, especially to twist or extend your arm.  Muscle inflammation.  An injury.  A mass of nerve tissue (ganglia).  Noncancerous fatty tumors (lipomas).  A bone tumor.  What increases the risk? This condition is more likely to develop in people who:  Play sports that involve moving their forearm a lot, such as tennis.  Have a job that involves using their forearm a lot, such as some factory jobs.  What are the signs or symptoms? Symptoms of this condition include:  Aching pain on the top of the forearm, back of the hand, or side of the elbow.  Pain while straightening the wrist or fingers.  Weakness in the forearm muscles and wrist.  How is this diagnosed? This condition may be diagnosed based on your symptoms, your medical history, and a physical exam. During the exam, you may be asked to move your hand, fingers, wrist, and arm in certain ways so your health care provider can find the source of your pain. Your health care provider may also order one or more tests to rule out other conditions. The tests may include:  An electromyogram (EMG). This test can show how well the radial nerve is working.  A nerve conduction study. This test measures how well electrical signals pass through your nerves.  An MRI. This test checks for causes of nerve problems, such as scarring from an injury, pressure on a nerve, or a tumor.  An ultrasound. This test can show certain injuries, such as tears to ligaments or tendons.  How is this treated? Treatment for this condition may include:  Avoiding activities that cause your symptoms to get worse or flare up.  Taking steroids or anti-inflammatory medicines, like ibuprofen, to help with  symptoms.  Wearing a splint or brace for support until your symptoms go away.  Doing exercises to regain strength and maintain movement and range of motion in your hand and arm.  Surgery to release the radial nerve.  Usually, surgery is not needed unless other treatments fail and symptoms last longer than 3 months. Follow these instructions at home: If You Have a Splint or Brace:  Wear it as told by your health care provider. Remove it only as told by your health care provider.  Loosen it if your fingers tingle, become numb, or turn cold and blue.  Do not let it get wet if it is not waterproof.  Keep it clean. Activity  Return to your normal activities as told by your health care provider. Ask your health care provider what activities are safe for you.  Do exercises as told by your health care provider. General Instructions  Do not use any tobacco products, including cigarettes, chewing tobacco, or e-cigarettes. If you need help quitting, ask your health care provider.  Take over-the-counter and prescription medicines only as told by your health care provider.  Keep all follow-up visits as told by your health care provider. This is important. How is this prevented?  Warm up and stretch before being active.  Cool down and stretch after being active.  Give your body time to rest between periods of activity.  Make sure to use equipment that fits you.  Be safe and responsible while being active to avoid falls.  Do at least 150 minutes of moderate-intensity exercise each week, such as brisk walking or water aerobics.  Maintain physical fitness, including: ? Strength. ? Flexibility. ? Cardiovascular fitness. ? Endurance. Contact a health care provider if:  Your symptoms do not improve within 12 weeks.  Your symptoms get worse.  Your wrist gets weak or droopy. Get help right away if:  Your pain is severe.  You cannot move part of your hand or arm. This  information is not intended to replace advice given to you by your health care provider. Make sure you discuss any questions you have with your health care provider. Document Released: 05/17/2005 Document Revised: 01/23/2016 Document Reviewed: 04/12/2015 Elsevier Interactive Patient Education  2018 Elsevier Inc.    Radial Tunnel Syndrome Rehab Ask your health care provider which exercises are safe for you. Do exercises exactly as told by your health care provider and adjust them as directed. It is normal to feel mild stretching, pulling, tightness, or discomfort as you do these exercises, but you should stop right away if you feel sudden pain or your pain gets worse.Do not begin these exercises until told by your health care provider. Stretching and range of motion exercises These exercises warm up your muscles and joints and improve the movement and flexibility of your forearm. These exercises also help to relieve pain, numbness, and tingling. Exercise A: Wrist flexion, active-assisted 1. Extend your left / right arm in front of you, and point your fingers downward. 2. If told by your health care provider, bend your left / right elbow. 3. Gently tip the palm of your hand toward your forearm. 4. If told by your health care provider, use your other hand to move your palm closer to your forearm. 5. Hold this position for __________ seconds. 6. Slowly return to the starting position. Repeat __________ times. Complete this exercise __________ times a day. Exercise B: Wrist flexion  1. Stand over a tabletop with your left / right hand resting palm-up on the tabletop and your fingers pointing away from your body. Your arm should be extended, and there should be a slight bend in your elbow. 2. Gently press the back of your hand down by straightening your elbow. You should feel a stretch in the top of your forearm. 3. Hold this position for __________ seconds. 4. Slowly return to the starting  position. Repeat __________ times. Complete this stretch __________ times a day. Strengthening exercises These exercises build strength and endurance in your forearm. Endurance is the ability to use your muscles for a long time, even after they get tired. Exercise C: Wrist flexors 1. Sit with your left / right forearm supported on a table and your hand resting palm-up over the edge of the table. Your elbow should be below the level of your shoulder. 2. Hold a __________ weight in your hand. Or, hold a rubber exercise band or tube in both hands, keeping your hands at the same level and hip distance apart. There should be a slight tension in the  exercise band or tube. 3. Slowly curl your hand up toward your forearm. 4. Hold this position for __________ seconds. 5. Slowly lower your hand to the starting position. Repeat __________ times. Complete this exercise __________ times a day. Exercise D: Radial deviators 1. Stand with a __________ weight in your hand. Or, sit with your healthy hand supported, and hold onto a rubber exercises band or tube. There should be a slight tension in the exercise band or tube. ? If you are holding a weight, move your thumb toward your forearm, raising your hand as far as it will go. ? If you are holding an exercise band or tube, pull on it as far as it will go with your thumb facing your forearm. 2. Hold this position for __________ seconds. 3. Slowly return to the starting position. Repeat __________ times. Complete this exercise __________ times a day. Exercise E: Grip  1. Hold one of these items in your hand: a dense sponge, a tennis ball, or a large, rolled sock. 2. Squeeze as hard as you can without increasing any pain. 3. Hold this position for __________ seconds. 4. Slowly release your grip. Repeat __________ times. Complete this exercise __________ times a day. This information is not intended to replace advice given to you by your health care provider.  Make sure you discuss any questions you have with your health care provider. Document Released: 05/17/2005 Document Revised: 01/22/2016 Document Reviewed: 04/12/2015 Elsevier Interactive Patient Education  [employer].

## 2017-07-13 DIAGNOSIS — I152 Hypertension secondary to endocrine disorders: Secondary | ICD-10-CM | POA: Insufficient documentation

## 2017-07-13 DIAGNOSIS — E1159 Type 2 diabetes mellitus with other circulatory complications: Secondary | ICD-10-CM | POA: Insufficient documentation

## 2017-07-13 DIAGNOSIS — I10 Essential (primary) hypertension: Secondary | ICD-10-CM

## 2017-07-13 DIAGNOSIS — G250 Essential tremor: Secondary | ICD-10-CM | POA: Insufficient documentation

## 2017-07-18 ENCOUNTER — Encounter: Payer: Self-pay | Admitting: Family Medicine

## 2017-07-18 LAB — HEMOGLOBIN A1C W/OUT EAG
ALBUMIN: 3.9
ANION GAP: 7
CO2: 28
Calcium: 8.7
Est. average glucose Bld gHb Est-mCnc: 189

## 2017-07-26 ENCOUNTER — Ambulatory Visit (INDEPENDENT_AMBULATORY_CARE_PROVIDER_SITE_OTHER): Payer: Medicare Other | Admitting: Sports Medicine

## 2017-07-26 ENCOUNTER — Encounter: Payer: Self-pay | Admitting: Sports Medicine

## 2017-07-26 VITALS — BP 120/73 | HR 81 | Resp 18 | Wt 283.0 lb

## 2017-07-26 DIAGNOSIS — R1030 Lower abdominal pain, unspecified: Secondary | ICD-10-CM

## 2017-07-26 DIAGNOSIS — N451 Epididymitis: Secondary | ICD-10-CM | POA: Diagnosis not present

## 2017-07-26 LAB — POCT URINALYSIS DIPSTICK
Bilirubin, UA: NEGATIVE
Blood, UA: NEGATIVE
Glucose, UA: 500
Ketones, UA: NEGATIVE
Leukocytes, UA: NEGATIVE
Nitrite, UA: NEGATIVE
Protein, UA: NEGATIVE
Spec Grav, UA: 1.02 (ref 1.010–1.025)
Urobilinogen, UA: 0.2 U/dL
pH, UA: 6 (ref 5.0–8.0)

## 2017-07-26 MED ORDER — CIPROFLOXACIN HCL 750 MG PO TABS: 750.0000 mg | ORAL_TABLET | Freq: Two times a day (BID) | ORAL | 0 refills | Status: DC

## 2017-07-26 MED ORDER — EMPAGLIFLOZIN-LINAGLIPTIN 25-5 MG PO TABS: 1.0000 | ORAL_TABLET | Freq: Every day | ORAL | 11 refills | Status: DC

## 2017-07-26 MED ORDER — HYDROCODONE-ACETAMINOPHEN 5-325 MG PO TABS: 1.0000 | ORAL_TABLET | Freq: Three times a day (TID) | ORAL | 0 refills | Status: DC | PRN

## 2017-07-26 MED ORDER — KETOROLAC TROMETHAMINE 30 MG/ML IJ SOLN
30.0000 mg | Freq: Once | INTRAMUSCULAR | Status: AC
  Administered 2017-07-26: 30 mg via INTRAMUSCULAR

## 2017-07-26 MED ORDER — METFORMIN HCL ER 750 MG PO TB24: 1500.0000 mg | ORAL_TABLET | Freq: Every day | ORAL | 3 refills | Status: DC

## 2017-07-26 NOTE — Assessment & Plan Note (Signed)
Severe, adding a urine culture. He has been having more of these episodes since starting Jardiance. Switching to Glyxambi. Pancreatitis with Victoza (GLP1s). Increasing metformin to 1000mg  BID. Ultrasound of the urinary tract and testicles. Cipro twice a day for 14 days. Toradol 30 intramuscular, hydrocodone for initial pain. Return to see me next week.

## 2017-07-26 NOTE — Progress Notes (Signed)
Subjective:    CC: Right testicular pain  HPI: This is a pleasant 67 year old male with a history of uncontrolled diabetes, he had an episode of epididymitis about 3 or 4 months ago.  This resolved with levofloxacin.  Ultimately over the past several days he has had a recurrence of severe pain, swelling in his right hemiscrotum, radiation to the perineum, and up to the right flank when he voids.  No overt dysuria, he has been voiding more often than usual, no burning, no penile discharge.  No constitutional symptoms.  I reviewed the past medical history, family history, social history, surgical history, and allergies today and no changes were needed.  Please see the problem list section below in epic for further details.  Past Medical History: Past Medical History:  Diagnosis Date  . Arthritis   . Chest pain    a. Normal cors 2001. b. Neg stress test 2012, 05/2014.  . Diabetes mellitus without complication (HCC)   . Hyperlipidemia   . Hypertensive response to exercise   . Obesity   . Obstructive sleep apnea    Currently untreated    Past Surgical History: Past Surgical History:  Procedure Laterality Date  . CERVICAL FUSION    . HERNIA REPAIR    . KNEE ARTHROSCOPY  1984   both knee   Social History: Social History   Socioeconomic History  . Marital status: Married    Spouse name: None  . Number of children: None  . Years of education: None  . Highest education level: None  Social Needs  . Financial resource strain: None  . Food insecurity - worry: None  . Food insecurity - inability: None  . Transportation needs - medical: None  . Transportation needs - non-medical: None  Occupational History  . None  Tobacco Use  . Smoking status: Never Smoker  . Smokeless tobacco: Never Used  Substance and Sexual Activity  . Alcohol use: No  . Drug use: No  . Sexual activity: None  Other Topics Concern  . None  Social History Narrative  . None   Family History: Family  History  Problem Relation Age of Onset  . Diabetes Mother   . Heart disease Mother   . Hypertension Mother   . Stroke Mother   . Cancer Father   . Heart disease Father   . Hyperlipidemia Sister   . Hypertension Sister   . Diabetes Maternal Grandmother   . Stroke Maternal Grandmother   . Diabetes Maternal Grandfather   . Hypertension Maternal Grandfather   . Diabetes Sister   . Hypertension Sister   . Heart attack Mother   . Heart attack Maternal Grandmother   . Heart attack Maternal Grandfather   . Heart attack Paternal Grandmother   . Heart attack Paternal Grandfather    Allergies: Allergies  Allergen Reactions  . Statins     Muscle cramping all over body. Lipitor, Crestor, and pravastatin.  Verdis Prime. Victoza [Liraglutide] Other (See Comments)    Caused pancreatitis   Medications: See med rec.  Review of Systems: No fevers, chills, night sweats, weight loss, chest pain, or shortness of breath.   Objective:    General: Well Developed, well nourished, and in no acute distress.  Neuro: Alert and oriented x3, extra-ocular muscles intact, sensation grossly intact.  HEENT: Normocephalic, atraumatic, pupils equal round reactive to light, neck supple, no masses, no lymphadenopathy, thyroid nonpalpable.  Skin: Warm and dry, no rashes. Cardiac: Regular rate and rhythm, no murmurs rubs or  gallops, no lower extremity edema.  Respiratory: Clear to auscultation bilaterally. Not using accessory muscles, speaking in full sentences. Scrotum: Swollen on the right side, palpable and tender right-sided epididymitis, no penile discharge.  No penile lesions.  Urinalysis is positive for glycosuria, likely from his Jardiance.  Impression and Recommendations:    Right epididymitis Severe, adding a urine culture. He has been having more of these episodes since starting Jardiance. Switching to Glyxambi. Pancreatitis with Victoza (GLP1s). Increasing metformin to 1000mg  BID. Ultrasound of the  urinary tract and testicles. Cipro twice a day for 14 days. Toradol 30 intramuscular, hydrocodone for initial pain. Return to see me next week. ___________________________________________ Ihor Austin. Benjamin Stain, M.D., ABFM., CAQSM. Primary Care and Sports Medicine [facility]  Adjunct Instructor of Family Medicine  [employer]

## 2017-07-27 ENCOUNTER — Other Ambulatory Visit: Payer: Self-pay | Admitting: Sports Medicine

## 2017-07-27 ENCOUNTER — Ambulatory Visit: Payer: PRIVATE HEALTH INSURANCE | Admitting: Family Medicine

## 2017-07-27 DIAGNOSIS — N289 Disorder of kidney and ureter, unspecified: Secondary | ICD-10-CM | POA: Diagnosis not present

## 2017-07-27 DIAGNOSIS — E119 Type 2 diabetes mellitus without complications: Secondary | ICD-10-CM | POA: Diagnosis not present

## 2017-07-27 DIAGNOSIS — N281 Cyst of kidney, acquired: Secondary | ICD-10-CM | POA: Diagnosis not present

## 2017-07-27 DIAGNOSIS — Z87891 Personal history of nicotine dependence: Secondary | ICD-10-CM | POA: Diagnosis not present

## 2017-07-27 DIAGNOSIS — N453 Epididymo-orchitis: Secondary | ICD-10-CM | POA: Diagnosis not present

## 2017-07-27 DIAGNOSIS — Z79899 Other long term (current) drug therapy: Secondary | ICD-10-CM | POA: Diagnosis not present

## 2017-07-27 DIAGNOSIS — R1031 Right lower quadrant pain: Secondary | ICD-10-CM | POA: Diagnosis not present

## 2017-07-27 DIAGNOSIS — N451 Epididymitis: Secondary | ICD-10-CM

## 2017-07-27 DIAGNOSIS — N5089 Other specified disorders of the male genital organs: Secondary | ICD-10-CM | POA: Diagnosis not present

## 2017-07-27 DIAGNOSIS — Z794 Long term (current) use of insulin: Secondary | ICD-10-CM | POA: Diagnosis not present

## 2017-07-27 DIAGNOSIS — Z87440 Personal history of urinary (tract) infections: Secondary | ICD-10-CM | POA: Diagnosis not present

## 2017-07-27 DIAGNOSIS — N5082 Scrotal pain: Secondary | ICD-10-CM | POA: Diagnosis not present

## 2017-07-27 DIAGNOSIS — N433 Hydrocele, unspecified: Secondary | ICD-10-CM | POA: Diagnosis not present

## 2017-07-28 ENCOUNTER — Other Ambulatory Visit: Payer: PRIVATE HEALTH INSURANCE

## 2017-07-28 DIAGNOSIS — N433 Hydrocele, unspecified: Secondary | ICD-10-CM | POA: Diagnosis not present

## 2017-07-28 DIAGNOSIS — N451 Epididymitis: Secondary | ICD-10-CM | POA: Diagnosis not present

## 2017-07-28 DIAGNOSIS — N5089 Other specified disorders of the male genital organs: Secondary | ICD-10-CM | POA: Diagnosis not present

## 2017-07-28 LAB — URINE CULTURE
MICRO NUMBER:: 90252137
SPECIMEN QUALITY:: ADEQUATE

## 2017-08-01 DIAGNOSIS — N451 Epididymitis: Secondary | ICD-10-CM | POA: Diagnosis not present

## 2017-08-02 ENCOUNTER — Encounter: Payer: Self-pay | Admitting: Sports Medicine

## 2017-08-02 ENCOUNTER — Ambulatory Visit (INDEPENDENT_AMBULATORY_CARE_PROVIDER_SITE_OTHER): Payer: Medicare Other | Admitting: Sports Medicine

## 2017-08-02 ENCOUNTER — Ambulatory Visit (INDEPENDENT_AMBULATORY_CARE_PROVIDER_SITE_OTHER): Payer: Medicare Other

## 2017-08-02 DIAGNOSIS — E118 Type 2 diabetes mellitus with unspecified complications: Secondary | ICD-10-CM | POA: Diagnosis not present

## 2017-08-02 DIAGNOSIS — R69 Illness, unspecified: Secondary | ICD-10-CM

## 2017-08-02 DIAGNOSIS — J111 Influenza due to unidentified influenza virus with other respiratory manifestations: Secondary | ICD-10-CM | POA: Insufficient documentation

## 2017-08-02 DIAGNOSIS — R05 Cough: Secondary | ICD-10-CM

## 2017-08-02 DIAGNOSIS — N451 Epididymitis: Secondary | ICD-10-CM | POA: Diagnosis not present

## 2017-08-02 DIAGNOSIS — Z794 Long term (current) use of insulin: Secondary | ICD-10-CM

## 2017-08-02 MED ORDER — BENZONATATE 200 MG PO CAPS: 200.0000 mg | ORAL_CAPSULE | Freq: Three times a day (TID) | ORAL | 0 refills | Status: DC | PRN

## 2017-08-02 MED ORDER — OSELTAMIVIR PHOSPHATE 75 MG PO CAPS
75.0000 mg | ORAL_CAPSULE | Freq: Two times a day (BID) | ORAL | 0 refills | Status: DC
Start: 2017-08-02 — End: 2017-09-13

## 2017-08-02 NOTE — Assessment & Plan Note (Signed)
Right base coarse sounds, chest x-ray. Tessalon Perles, Tamiflu. Still on doxycycline.

## 2017-08-02 NOTE — Progress Notes (Signed)
Subjective:    CC: Follow-up  HPI: This is a pleasant 67 year old male, he returns with regards to his orchitis/epididymitis.  Ultimately when I saw him we ordered an MRI, for some reason he did not get this done right away, proceeded eventually to the emergency department where an ultrasound was done that showed findings consistent with epididymoorchitis, he then followed up with urology, they had no suspicion for torsion, he was switched to doxycycline, returns today feeling significantly better.  With diabetes mellitus type 2: Was uncontrolled on Jardiance, metformin, we switched him to [facility], increased his metformin.  He can follow-up with his endocrinologist.  Coughing: Present for 1 day, muscle aches, body aches, flu like symptoms.  No real shortness of breath or chest pain.  Constitutional symptoms are present with subjective fevers and chills.  I reviewed the past medical history, family history, social history, surgical history, and allergies today and no changes were needed.  Please see the problem list section below in epic for further details.  Past Medical History: Past Medical History:  Diagnosis Date  . Arthritis   . Chest pain    a. Normal cors 2001. b. Neg stress test 2012, 05/2014.  . Diabetes mellitus without complication (HCC)   . Hyperlipidemia   . Hypertensive response to exercise   . Obesity   . Obstructive sleep apnea    Currently untreated    Past Surgical History: Past Surgical History:  Procedure Laterality Date  . CERVICAL FUSION    . HERNIA REPAIR    . KNEE ARTHROSCOPY  1984   both knee   Social History: Social History   Socioeconomic History  . Marital status: Married    Spouse name: Not on file  . Number of children: Not on file  . Years of education: Not on file  . Highest education level: Not on file  Social Needs  . Financial resource strain: Not on file  . Food insecurity - worry: Not on file  . Food insecurity - inability: Not on  file  . Transportation needs - medical: Not on file  . Transportation needs - non-medical: Not on file  Occupational History  . Not on file  Tobacco Use  . Smoking status: Never Smoker  . Smokeless tobacco: Never Used  Substance and Sexual Activity  . Alcohol use: No  . Drug use: No  . Sexual activity: Not on file  Other Topics Concern  . Not on file  Social History Narrative  . Not on file   Family History: Family History  Problem Relation Age of Onset  . Diabetes Mother   . Heart disease Mother   . Hypertension Mother   . Stroke Mother   . Cancer Father   . Heart disease Father   . Hyperlipidemia Sister   . Hypertension Sister   . Diabetes Maternal Grandmother   . Stroke Maternal Grandmother   . Diabetes Maternal Grandfather   . Hypertension Maternal Grandfather   . Diabetes Sister   . Hypertension Sister   . Heart attack Mother   . Heart attack Maternal Grandmother   . Heart attack Maternal Grandfather   . Heart attack Paternal Grandmother   . Heart attack Paternal Grandfather    Allergies: Allergies  Allergen Reactions  . Statins     Muscle cramping all over body. Lipitor, Crestor, and pravastatin.  Verdis Prime [Liraglutide] Other (See Comments)    Caused pancreatitis   Medications: See med rec.  Review of Systems: No fevers, chills,  night sweats, weight loss, chest pain, or shortness of breath.   Objective:    General: Well Developed, well nourished, and in no acute distress.  Neuro: Alert and oriented x3, extra-ocular muscles intact, sensation grossly intact.  HEENT: Normocephalic, atraumatic, pupils equal round reactive to light, neck supple, no masses, no lymphadenopathy, thyroid nonpalpable.  Oropharynx, nasopharynx, ear canals unremarkable Skin: Warm and dry, no rashes. Cardiac: Regular rate and rhythm, no murmurs rubs or gallops, no lower extremity edema.  Respiratory: Coarse sounds in the right lung field.  Not using accessory muscles, speaking in  full sentences.  Impression and Recommendations:    Right epididymitis Improving considerably with the switch to doxycycline. Continue follow-up with urology for this, ultrasound showed epididymoorchitis.   No suspicion for torsion clinically.  Type II diabetes mellitus with complication He will note better control with the increase in his metformin and the [facility]iance [facility]. If recurrence of epididymoorchitis with we will consider discontinuing his SGLT-2 inhibitor, picking something else. Of note he did get pancreatitis on Victoza.  Influenza-like illness Right base coarse sounds, chest x-ray. Tessalon Perles, Tamiflu. Still on doxycycline.  ___________________________________________ Ihor Austinhomas J. Benjamin Stainhekkekandam, M.D., ABFM., CAQSM. Primary Care and Sports Medicine [facility]  Adjunct Instructor of Family Medicine  [employer]

## 2017-08-02 NOTE — Assessment & Plan Note (Signed)
He will note better control with the increase in his metformin and the [facility]ardiance [facility]. If recurrence of epididymoorchitis with we will consider discontinuing his SGLT-2 inhibitor, picking something else. Of note he did get pancreatitis on Victoza.

## 2017-08-02 NOTE — Assessment & Plan Note (Signed)
Improving considerably with the switch to doxycycline. Continue follow-up with urology for this, ultrasound showed epididymoorchitis.   No suspicion for torsion clinically.

## 2017-08-16 ENCOUNTER — Ambulatory Visit: Payer: PRIVATE HEALTH INSURANCE | Admitting: Sports Medicine

## 2017-08-16 DIAGNOSIS — Z0189 Encounter for other specified special examinations: Secondary | ICD-10-CM

## 2017-08-18 ENCOUNTER — Ambulatory Visit (INDEPENDENT_AMBULATORY_CARE_PROVIDER_SITE_OTHER): Payer: Medicare Other | Admitting: Sports Medicine

## 2017-08-18 ENCOUNTER — Encounter: Payer: Self-pay | Admitting: Sports Medicine

## 2017-08-18 DIAGNOSIS — J111 Influenza due to unidentified influenza virus with other respiratory manifestations: Secondary | ICD-10-CM

## 2017-08-18 DIAGNOSIS — R69 Illness, unspecified: Secondary | ICD-10-CM | POA: Diagnosis not present

## 2017-08-18 DIAGNOSIS — R040 Epistaxis: Secondary | ICD-10-CM | POA: Diagnosis not present

## 2017-08-18 DIAGNOSIS — N451 Epididymitis: Secondary | ICD-10-CM

## 2017-08-18 NOTE — Progress Notes (Signed)
Subjective:    CC: Follow-up  HPI: Influenza-like illness: Resolved with Tamiflu.  Epididymoorchitis: Resolved with doxycycline.  Epistaxis: Began today, has been on and off over the past couple of days however, significant bleeding all over himself, over the floor.  No history of significant epistaxis however on review of the chart he has had an episode of thrombocytopenia but mild, only to the 120s.  I reviewed the past medical history, family history, social history, surgical history, and allergies today and no changes were needed.  Please see the problem list section below in epic for further details.  Past Medical History: Past Medical History:  Diagnosis Date  . Arthritis   . Chest pain    a. Normal cors 2001. b. Neg stress test 2012, 05/2014.  . Diabetes mellitus without complication (HCC)   . Hyperlipidemia   . Hypertensive response to exercise   . Obesity   . Obstructive sleep apnea    Currently untreated    Past Surgical History: Past Surgical History:  Procedure Laterality Date  . CERVICAL FUSION    . HERNIA REPAIR    . KNEE ARTHROSCOPY  1984   both knee   Social History: Social History   Socioeconomic History  . Marital status: Married    Spouse name: Not on file  . Number of children: Not on file  . Years of education: Not on file  . Highest education level: Not on file  Occupational History  . Not on file  Social Needs  . Financial resource strain: Not on file  . Food insecurity:    Worry: Not on file    Inability: Not on file  . Transportation needs:    Medical: Not on file    Non-medical: Not on file  Tobacco Use  . Smoking status: Never Smoker  . Smokeless tobacco: Never Used  Substance and Sexual Activity  . Alcohol use: No  . Drug use: No  . Sexual activity: Not on file  Lifestyle  . Physical activity:    Days per week: Not on file    Minutes per session: Not on file  . Stress: Not on file  Relationships  . Social connections:   Talks on phone: Not on file    Gets together: Not on file    Attends religious service: Not on file    Active member of club or organization: Not on file    Attends meetings of clubs or organizations: Not on file    Relationship status: Not on file  Other Topics Concern  . Not on file  Social History Narrative  . Not on file   Family History: Family History  Problem Relation Age of Onset  . Diabetes Mother   . Heart disease Mother   . Hypertension Mother   . Stroke Mother   . Cancer Father   . Heart disease Father   . Hyperlipidemia Sister   . Hypertension Sister   . Diabetes Maternal Grandmother   . Stroke Maternal Grandmother   . Diabetes Maternal Grandfather   . Hypertension Maternal Grandfather   . Diabetes Sister   . Hypertension Sister   . Heart attack Mother   . Heart attack Maternal Grandmother   . Heart attack Maternal Grandfather   . Heart attack Paternal Grandmother   . Heart attack Paternal Grandfather    Allergies: Allergies  Allergen Reactions  . Statins     Muscle cramping all over body. Lipitor, Crestor, and pravastatin.  Verdis Prime. Victoza [Liraglutide] Other (  See Comments)    Caused pancreatitis   Medications: See med rec.  Review of Systems: No fevers, chills, night sweats, weight loss, chest pain, or shortness of breath.   Objective:    General: Well Developed, well nourished, and in no acute distress.  Neuro: Alert and oriented x3, extra-ocular muscles intact, sensation grossly intact.  HEENT: Normocephalic, atraumatic, pupils equal round reactive to light, neck supple, no masses, no lymphadenopathy, thyroid nonpalpable.  Right-sided copious epistaxis, after about 10 minutes of pressure I was able to visualize some bleeding from the septum, but my visual field was quickly obscured. Skin: Warm and dry, no rashes. Cardiac: Regular rate and rhythm, no murmurs rubs or gallops, no lower extremity edema.  Respiratory: Clear to auscultation bilaterally. Not  using accessory muscles, speaking in full sentences.  Procedure: Nasal packing Consent obtained, alternatives explained. Using a small tampon I advanced it into the right nasal canal, I then soaked it with lidocaine and epinephrine. We were able to achieve hemostasis. Aftercare advised.  Impression and Recommendations:    Epistaxis This occurs frequently, on review of his chart he has had some thrombocytopenia. We are going to check a CBC today, PT, PTT. We achieved hemostasis with a tampon soaked with lidocaine and epinephrine.  Influenza-like illness Resolved with Tamiflu.  Right epididymitis Resolved with antibiotics. ___________________________________________ Ihor Austin. Benjamin Stain, M.D., ABFM., CAQSM. Primary Care and Sports Medicine [facility]  Adjunct Instructor of Family Medicine  [employer]

## 2017-08-18 NOTE — Assessment & Plan Note (Signed)
Resolved with antibiotics 

## 2017-08-18 NOTE — Patient Instructions (Signed)
Nosebleed, Adult A nosebleed is when blood comes out of the nose. Nosebleeds are common. Usually, they are not a sign of a serious condition. Nosebleeds can happen if a small blood vessel in your nose starts to bleed or if the lining of your nose (mucous membrane) cracks. They are commonly caused by:  Allergies.  Colds.  Picking your nose.  Blowing your nose too hard.  An injury from sticking an object into your nose or getting hit in the nose.  Dry or cold air.  Less common causes of nosebleeds include:  Toxic fumes.  Something abnormal in the nose or in the air-filled spaces in the bones of the face (sinuses).  Growths in the nose, such as polyps.  Medicines or conditions that cause blood to clot slowly.  Certain illnesses or procedures that irritate or dry out the nasal passages.  Follow these instructions at home: When you have a nosebleed:  Sit down and tilt your head slightly forward.  Use a clean towel or tissue to pinch your nostrils under the bony part of your nose. After 10 minutes, let go of your nose and see if bleeding starts again. Do not release pressure before that time. If there is still bleeding, repeat the pinching and holding for 10 minutes until the bleeding stops.  Do not place tissues or gauze in the nose to stop bleeding.  Avoid lying down and avoid tilting your head backward. That may make blood collect in the throat and cause gagging or coughing.  Use a nasal spray decongestant to help with a nosebleed as told by your health care provider.  Do not use petroleum jelly or mineral oil in your nose. It can drip into your lungs. After a nosebleed:  Avoid blowing your nose or sniffing for a number of hours.  Avoid straining, lifting, or bending at the waist for several days. You may resume other normal activities as you are able.  Use saline spray or a humidifier as told by your health care provider.  Aspirinand blood thinners make bleeding more  likely. If you are prescribed these medicines and you suffer from nosebleeds: ? Ask your health care provider if you should stop taking the medicines or if you should adjust the dose. ? Do not stop taking medicines that your health care provider has recommended unless told by your health care provider.  If your nosebleed was caused by dry mucous membranes, use over-the-counter saline nasal spray or gel. This will keep the mucous membranes moist and allow them to heal. If you must use a lubricant: ? Choose one that is water-soluble. ? Use only as much as you need and use it only as often as needed. ? Do not lie down until several hours after you use it. Contact a health care provider if:  You have a fever.  You get nosebleeds often or more often than usual.  You bruise very easily.  You have a nosebleed from having something stuck in your nose.  You have bleeding in your mouth.  You vomit or cough up brown material.  You have a nosebleed after you start a new medicine. Get help right away if:  You have a nosebleed after a fall or a head injury.  Your nosebleed does not go away after 20 minutes.  You feel dizzy or weak.  You have unusual bleeding from other parts of your body.  You have unusual bruising on other parts of your body.  You become sweaty.    You vomit blood. This information is not intended to replace advice given to you by your health care provider. Make sure you discuss any questions you have with your health care provider. Document Released: 02/24/2005 Document Revised: 01/15/2016 Document Reviewed: 12/02/2015 Elsevier Interactive Patient Education  2018 Elsevier Inc.  

## 2017-08-18 NOTE — Assessment & Plan Note (Signed)
Resolved with Tamiflu.

## 2017-08-18 NOTE — Assessment & Plan Note (Addendum)
This occurs frequently, on review of his chart he has had some thrombocytopenia. We are going to check a CBC today, PT, PTT. We achieved hemostasis with a tampon soaked with lidocaine and epinephrine.

## 2017-08-19 LAB — CBC
HCT: 43 % (ref 38.5–50.0)
Hemoglobin: 14.8 g/dL (ref 13.2–17.1)
MCH: 30.1 pg (ref 27.0–33.0)
MCHC: 34.4 g/dL (ref 32.0–36.0)
MCV: 87.6 fL (ref 80.0–100.0)
MPV: 9.6 fL (ref 7.5–12.5)
Platelets: 180 10*3/uL (ref 140–400)
RBC: 4.91 Million/uL (ref 4.20–5.80)
RDW: 13.2 % (ref 11.0–15.0)
WBC: 4.4 Thousand/uL (ref 3.8–10.8)

## 2017-08-19 LAB — PROTIME-INR
INR: 1
Prothrombin Time: 10.7 s (ref 9.0–11.5)

## 2017-08-19 LAB — APTT: aPTT: 27 s (ref 22–34)

## 2017-09-13 ENCOUNTER — Ambulatory Visit (INDEPENDENT_AMBULATORY_CARE_PROVIDER_SITE_OTHER): Payer: Medicare Other | Admitting: Family Medicine

## 2017-09-13 ENCOUNTER — Encounter: Payer: Self-pay | Admitting: Family Medicine

## 2017-09-13 VITALS — BP 138/86 | HR 86 | Temp 97.7°F | Ht 70.0 in | Wt 286.0 lb

## 2017-09-13 DIAGNOSIS — E118 Type 2 diabetes mellitus with unspecified complications: Secondary | ICD-10-CM | POA: Diagnosis not present

## 2017-09-13 DIAGNOSIS — I152 Hypertension secondary to endocrine disorders: Secondary | ICD-10-CM

## 2017-09-13 DIAGNOSIS — G250 Essential tremor: Secondary | ICD-10-CM

## 2017-09-13 DIAGNOSIS — E1159 Type 2 diabetes mellitus with other circulatory complications: Secondary | ICD-10-CM

## 2017-09-13 DIAGNOSIS — E230 Hypopituitarism: Secondary | ICD-10-CM | POA: Diagnosis not present

## 2017-09-13 DIAGNOSIS — N451 Epididymitis: Secondary | ICD-10-CM

## 2017-09-13 DIAGNOSIS — M25511 Pain in right shoulder: Secondary | ICD-10-CM | POA: Diagnosis not present

## 2017-09-13 DIAGNOSIS — Z794 Long term (current) use of insulin: Secondary | ICD-10-CM | POA: Diagnosis not present

## 2017-09-13 DIAGNOSIS — E782 Mixed hyperlipidemia: Secondary | ICD-10-CM

## 2017-09-13 DIAGNOSIS — I10 Essential (primary) hypertension: Secondary | ICD-10-CM | POA: Diagnosis not present

## 2017-09-13 MED ORDER — METFORMIN HCL ER 750 MG PO TB24: 750.0000 mg | ORAL_TABLET | Freq: Two times a day (BID) | ORAL | 3 refills | Status: DC

## 2017-09-13 NOTE — Patient Instructions (Addendum)
Thank you for coming in today. [facility].  Return in 3 months or sooner if needed.  Work on the shoulder exercises up to the front, up to the side, internal and external rotation with bands or light hand weights.  If not better we can do physical therapy and or injection.

## 2017-09-13 NOTE — Progress Notes (Signed)
Gregory Governor RooksA Gregory [name] is a 67 y.o. male who [facility] Medicine today for follow up DM, HTN, HLD, and epidymidis.   DM: Taking Glyxambi, Metformin XR 750 bid, Actos, and invokana. He additionally takes Lantus 40 units BID. He notes that his fasting sugars has been from 140-160s typically. He denies any fever, chills polyuria or polydyspia recently. He has a follow up appointment with his endocrinologist soon for recheck labs.   HTN: Taking Losartan 25, and propranolol (for essential tremor). He denies any chest pain or palpitations or SOB.   HLD: Mild managed with Crestor 5. He has trouble tolerating higher doses of statins.   He recently had significant trouble with orchitis and epidymidis but is feeling much better now after management by urology.   He notes mild right shoulder pain for the last few weeks which is getting a bit better now. He notes that the pain is located in the right upper arm and is worse with overhead motion. He denies any radiating pain weakness or numbness. He denies any injury to explain the pain.  He has not tried much treatment yet.    Past Medical History:  Diagnosis Date  . Arthritis   . Chest pain    a. Normal cors 2001. b. Neg stress test 2012, 05/2014.  . Diabetes mellitus without complication (HCC)   . Hyperlipidemia   . Hypertensive response to exercise   . Obesity   . Obstructive sleep apnea    Currently untreated    Past Surgical History:  Procedure Laterality Date  . CERVICAL FUSION    . HERNIA REPAIR    . KNEE ARTHROSCOPY  1984   both knee   Social History   Tobacco Use  . Smoking status: Never Smoker  . Smokeless tobacco: Never Used  Substance Use Topics  . Alcohol use: No   family history includes Cancer in his father; Diabetes in his maternal grandfather, maternal grandmother, mother, and sister; Heart  attack in his maternal grandfather, maternal grandmother, mother, paternal grandfather, and paternal grandmother; Heart disease in his father and mother; Hyperlipidemia in his sister; Hypertension in his maternal grandfather, mother, sister, and sister; Stroke in his maternal grandmother and mother.  ROS as above:  Medications: Current Outpatient Medications  Medication Sig Dispense Refill  . aspirin 81 MG tablet Take 81 mg by mouth daily.    . benzonatate (TESSALON) 200 MG capsule Take 1 capsule (200 mg total) by mouth 3 (three) times daily as needed for cough. 45 capsule 0  . cholecalciferol (VITAMIN D) 1000 units tablet Take by mouth.    . Cholecalciferol (VITAMIN D3) 1000 units CAPS Take 6,000 Units by mouth daily.    . Empagliflozin-Linagliptin 25-5 MG TABS Take 1 tablet by mouth daily. 30 tablet 11  . HYDROcodone-acetaminophen (NORCO/VICODIN) 5-325 MG tablet Take 1 tablet by mouth every 8 (eight) hours as needed for moderate pain. (Patient not taking: Reported on 08/18/2017) 15 tablet 0  . insulin glargine (LANTUS) 100 UNIT/ML injection Inject 30 Units into the skin 2 (two) times daily.     Marland Kitchen. losartan (COZAAR) 25 MG tablet Take 1 tablet (25 mg total) by mouth daily. 90 tablet 1  . metFORMIN (GLUCOPHAGE XR) 750 MG 24 hr tablet Take 2 tablets (1,500 mg total) by mouth daily with breakfast. 60 tablet 3  . ONETOUCH VERIO test strip 1 each by Other route as needed.     .Marland Kitchen  oseltamivir (TAMIFLU) 75 MG capsule Take 1 capsule (75 mg total) by mouth 2 (two) times daily. (Patient not taking: Reported on 08/18/2017) 10 capsule 0  . pioglitazone (ACTOS) 15 MG tablet Take 15 mg by mouth daily.     . propranolol ER (INDERAL LA) 60 MG 24 hr capsule Take 1 capsule (60 mg total) by mouth daily. 90 capsule 1  . rosuvastatin (CRESTOR) 5 MG tablet Take 1 tablet by mouth daily.    . tamsulosin (FLOMAX) 0.4 MG CAPS capsule Take 1 tablet once a day for prostate    . traZODone (DESYREL) 50 MG tablet Take 0.5-1  tablets (25-50 mg total) by mouth at bedtime as needed for sleep. 90 tablet 3   No current facility-administered medications for this visit.    Allergies  Allergen Reactions  . Statins     Muscle cramping all over body. Lipitor, Crestor, and pravastatin.  Verdis Prime [Liraglutide] Other (See Comments)    Caused pancreatitis    Health Maintenance Health Maintenance  Topic Date Due  . Hepatitis C Screening  21-Jul-1950  . HIV Screening  11/24/1965  . OPHTHALMOLOGY EXAM  11/29/2015  . HEMOGLOBIN A1C  12/26/2017  . INFLUENZA VACCINE  12/29/2017  . FOOT EXAM  07/12/2018  . PNA vac Low Risk Adult (2 of 2 - PPSV23) 07/30/2020  . TETANUS/TDAP  07/30/2025  . COLONOSCOPY  02/23/2026     Exam:  BP 138/86   Pulse 86   Temp 97.7 F (36.5 C) (Oral)   Ht 5\' 10"  (1.778 m)   Wt 286 lb (129.7 kg)   BMI 41.04 kg/m  Gen: Well NAD HEENT: EOMI,  MMM Lungs: Normal work of breathing. CTABL Heart: RRR no MRG Abd: NABS, Soft. Nondistended, Nontender Exts: Brisk capillary refill, warm and well perfused.  MSK Cspine: Non-tender normal ROM.  Right shoulder normal appearing.  Non-tender.  ROM intact but pain with abduction.  Strength 4/5 abduction and external rotation and 5/5 internal rotation. Compared to 5/5 throughout left shoulder.  Positive Hawkins and Neer's test. Negative Yergason's and speeds test. Pulses capillary refill and sensation intact distally BL UE.      Assessment and Plan: 67 y.o. male with  DM: Stable but likely room for higher insulin doses. Pt intolerant to GLP1 in the past. Follow up with endocrinology and recheck with me in 3 months.  HTN: Doing well consider increasing losartan in the future if BP remains slightly elevated as today.    HLD: Doing well recheck lipids in the near future.   Epidymidis: Much improved. Watchful waiting.  Right Shoulder pain: RTC tendonitis. Plan for home exercise program and recheck PRN. Consider injection if not better. Also  consider formal PT.       Discussed warning signs or symptoms. Please see discharge instructions. Patient expresses understanding.  CC: Altheimer, Vale Haven, MD  [address] [place], [place] [address]  [phone]  [phone] (Fax)

## 2017-09-14 NOTE — Progress Notes (Signed)
Faxed

## 2017-09-19 DIAGNOSIS — E230 Hypopituitarism: Secondary | ICD-10-CM | POA: Diagnosis not present

## 2017-09-19 DIAGNOSIS — E782 Mixed hyperlipidemia: Secondary | ICD-10-CM | POA: Diagnosis not present

## 2017-09-19 DIAGNOSIS — E119 Type 2 diabetes mellitus without complications: Secondary | ICD-10-CM | POA: Diagnosis not present

## 2017-09-22 DIAGNOSIS — Z794 Long term (current) use of insulin: Secondary | ICD-10-CM | POA: Diagnosis not present

## 2017-09-22 DIAGNOSIS — E782 Mixed hyperlipidemia: Secondary | ICD-10-CM | POA: Diagnosis not present

## 2017-09-22 DIAGNOSIS — I10 Essential (primary) hypertension: Secondary | ICD-10-CM | POA: Diagnosis not present

## 2017-09-22 DIAGNOSIS — E559 Vitamin D deficiency, unspecified: Secondary | ICD-10-CM | POA: Diagnosis not present

## 2017-09-22 DIAGNOSIS — E230 Hypopituitarism: Secondary | ICD-10-CM | POA: Diagnosis not present

## 2017-09-22 DIAGNOSIS — E119 Type 2 diabetes mellitus without complications: Secondary | ICD-10-CM | POA: Diagnosis not present

## 2017-09-26 ENCOUNTER — Ambulatory Visit (INDEPENDENT_AMBULATORY_CARE_PROVIDER_SITE_OTHER): Payer: Medicare Other | Admitting: Podiatry

## 2017-09-26 VITALS — BP 141/78 | HR 74 | Ht 70.0 in | Wt 286.0 lb

## 2017-09-26 DIAGNOSIS — L600 Ingrowing nail: Secondary | ICD-10-CM

## 2017-09-26 DIAGNOSIS — B351 Tinea unguium: Secondary | ICD-10-CM

## 2017-09-26 DIAGNOSIS — M6700 Short Achilles tendon (acquired), unspecified ankle: Secondary | ICD-10-CM

## 2017-09-26 DIAGNOSIS — E0800 Diabetes mellitus due to underlying condition with hyperosmolarity without nonketotic hyperglycemic-hyperosmolar coma (NKHHC): Secondary | ICD-10-CM | POA: Diagnosis not present

## 2017-09-26 NOTE — Patient Instructions (Signed)
Seen for diabetic foot care. Noted of tight Achilles tendon, abnormal irregular discolored skin surface, and hypertrophic and ingrown nails. All nails debrided. Need daily stretch exercise for tight Achilles tendon. Return in 3 months or as needed.

## 2017-09-26 NOTE — Progress Notes (Signed)
SUBJECTIVE: 67 y.o. year old male presents for diabetic foot care. This morning blood sugar was 143. Been up and being monitored closely.  Review of Systems  HENT: Negative.   Eyes: Negative.   Respiratory: Negative.   Genitourinary: Negative.   Musculoskeletal:       Neck surgery x 2, fused 2, 3, 4th spine. Both knee surgery done and wearing brace on left, pain starting on right. Both feet surgery done in 1989.  Skin: Negative.      OBJECTIVE: DERMATOLOGIC EXAMINATION: Hypertrophic nails x 10. Painful ingrown hallucal nails without open skin or inflamed ungual labia.  VASCULAR EXAMINATION OF LOWER LIMBS: All pedal pulses are palpable with normal pulsation.  Capillary Filling times within 3 seconds in all digits.  No edema or erythema noted. Temperature gradient from tibial crest to dorsum of foot is within normal bilateral.  NEUROLOGIC EXAMINATION OF THE LOWER LIMBS: Achilles DTR is present and within normal. Monofilament (Semmes-Weinstein 10-gm) sensory testing positive 6 out of 6, bilateral. Vibratory sensations(128Hz  turning fork) intact at medial and lateral forefoot bilateral.  Sharp and Dull discriminatory sensations at the plantar ball of hallux is intact bilateral.   MUSCULOSKELETAL EXAMINATION: Positive for tight Achilles tendon bilateral.  ASSESSMENT: Onychomycosis x 10. Ingrown hallucal nails. Painful feet. Achilles tendon contracture bilateral.  PLAN: Reviewed findings and available treatment options. All nails debrided. Reviewed daily stretch exercise for tight Achilles tendon bilateral. Return in 3 month or sooner if needed.

## 2017-09-27 ENCOUNTER — Encounter: Payer: Self-pay | Admitting: Podiatry

## 2017-10-04 ENCOUNTER — Telehealth: Payer: Self-pay | Admitting: Family Medicine

## 2017-10-04 ENCOUNTER — Encounter: Payer: Self-pay | Admitting: Family Medicine

## 2017-10-04 ENCOUNTER — Ambulatory Visit (INDEPENDENT_AMBULATORY_CARE_PROVIDER_SITE_OTHER): Payer: Medicare Other

## 2017-10-04 ENCOUNTER — Ambulatory Visit (INDEPENDENT_AMBULATORY_CARE_PROVIDER_SITE_OTHER): Payer: Medicare Other | Admitting: Family Medicine

## 2017-10-04 VITALS — BP 147/82 | HR 84 | Temp 98.1°F | Ht 70.0 in | Wt 284.0 lb

## 2017-10-04 DIAGNOSIS — R0989 Other specified symptoms and signs involving the circulatory and respiratory systems: Secondary | ICD-10-CM

## 2017-10-04 DIAGNOSIS — R05 Cough: Secondary | ICD-10-CM

## 2017-10-04 DIAGNOSIS — R059 Cough, unspecified: Secondary | ICD-10-CM

## 2017-10-04 DIAGNOSIS — R509 Fever, unspecified: Secondary | ICD-10-CM

## 2017-10-04 MED ORDER — PREDNISONE 10 MG PO TABS: 30.0000 mg | ORAL_TABLET | Freq: Every day | ORAL | 0 refills | Status: DC

## 2017-10-04 MED ORDER — IPRATROPIUM-ALBUTEROL 0.5-2.5 (3) MG/3ML IN SOLN
3.0000 mL | Freq: Once | RESPIRATORY_TRACT | Status: AC
  Administered 2017-10-04: 3 mL via RESPIRATORY_TRACT

## 2017-10-04 MED ORDER — IPRATROPIUM-ALBUTEROL 0.5-2.5 (3) MG/3ML IN SOLN: 3.0000 mL | Freq: Four times a day (QID) | RESPIRATORY_TRACT | Status: DC

## 2017-10-04 MED ORDER — CEFDINIR 300 MG PO CAPS: 300.0000 mg | ORAL_CAPSULE | Freq: Two times a day (BID) | ORAL | 0 refills | Status: DC

## 2017-10-04 MED ORDER — GUAIFENESIN-CODEINE 100-10 MG/5ML PO SOLN: 5.0000 mL | Freq: Four times a day (QID) | ORAL | 0 refills | Status: DC | PRN

## 2017-10-04 NOTE — Patient Instructions (Signed)
Thank you for coming in today. Take the prednisone and cough medicine as needed.  Get xray today.  If worse let me know and I will send in antibiotics.  Call or go to the emergency room if you get worse, have trouble breathing, have chest pains, or palpitations.    Acute Bronchitis, Adult Acute bronchitis is when air tubes (bronchi) in the lungs suddenly get swollen. The condition can make it hard to breathe. It can also cause these symptoms:  A cough.  Coughing up clear, yellow, or green mucus.  Wheezing.  Chest congestion.  Shortness of breath.  A fever.  Body aches.  Chills.  A sore throat.  Follow these instructions at home: Medicines  Take over-the-counter and prescription medicines only as told by your doctor.  If you were prescribed an antibiotic medicine, take it as told by your doctor. Do not stop taking the antibiotic even if you start to feel better. General instructions  Rest.  Drink enough fluids to keep your pee (urine) clear or pale yellow.  Avoid smoking and secondhand smoke. If you smoke and you need help quitting, ask your doctor. Quitting will help your lungs heal faster.  Use an inhaler, cool mist vaporizer, or humidifier as told by your doctor.  Keep all follow-up visits as told by your doctor. This is important. How is this prevented? To lower your risk of getting this condition again:  Wash your hands often with soap and water. If you cannot use soap and water, use hand sanitizer.  Avoid contact with people who have cold symptoms.  Try not to touch your hands to your mouth, nose, or eyes.  Make sure to get the flu shot every year.  Contact a doctor if:  Your symptoms do not get better in 2 weeks. Get help right away if:  You cough up blood.  You have chest pain.  You have very bad shortness of breath.  You become dehydrated.  You faint (pass out) or keep feeling like you are going to pass out.  You keep throwing up  (vomiting).  You have a very bad headache.  Your fever or chills gets worse. This information is not intended to replace advice given to you by your health care provider. Make sure you discuss any questions you have with your health care provider. Document Released: 11/03/2007 Document Revised: 12/24/2015 Document Reviewed: 11/05/2015 Elsevier Interactive Patient Education  [employer].

## 2017-10-04 NOTE — Progress Notes (Signed)
Gregory Governor Rooks [name] is a 67 y.o. male who presents to [name] today for cough congestion wheezing.  Symptoms present for 4 days.  Multiple family members sick with similar illness.  Patient has tried OTC chloracidin which has not helped much.  Cough interferes with sleep.  No chest pain or palpitations or severe SOB.    Past Medical History:  Diagnosis Date  . Arthritis   . Chest pain    a. Normal cors 2001. b. Neg stress test 2012, 05/2014.  . Diabetes mellitus without complication (HCC)   . Hyperlipidemia   . Hypertensive response to exercise   . Obesity   . Obstructive sleep apnea    Currently untreated    Past Surgical History:  Procedure Laterality Date  . CERVICAL FUSION    . HERNIA REPAIR    . KNEE ARTHROSCOPY  1984   both knee   Social History   Tobacco Use  . Smoking status: Never Smoker  . Smokeless tobacco: Never Used  Substance Use Topics  . Alcohol use: No   family history includes Cancer in his father; Diabetes in his maternal grandfather, maternal grandmother, mother, and sister; Heart attack in his maternal grandfather, maternal grandmother, mother, paternal grandfather, and paternal grandmother; Heart disease in his father and mother; Hyperlipidemia in his sister; Hypertension in his maternal grandfather, mother, sister, and sister; Stroke in his maternal grandmother and mother.  ROS as above:  Medications: Current Outpatient Medications  Medication Sig Dispense Refill  . aspirin 81 MG tablet Take 81 mg by mouth daily.    . cholecalciferol (VITAMIN D) 1000 units tablet Take by mouth.    . Cholecalciferol (VITAMIN D3) 1000 units CAPS Take 6,000 Units by mouth daily.    . Empagliflozin-Linagliptin 25-5 MG TABS Take 1 tablet by mouth daily. 30 tablet 11  . HYDROcodone-acetaminophen (NORCO/VICODIN) 5-325 MG tablet Take 1 tablet by  mouth every 8 (eight) hours as needed for moderate pain. 15 tablet 0  . insulin glargine (LANTUS) 100 UNIT/ML injection Inject 40 Units into the skin 2 (two) times daily.    Marland Kitchen losartan (COZAAR) 25 MG tablet Take 1 tablet (25 mg total) by mouth daily. 90 tablet 1  . metFORMIN (GLUCOPHAGE XR) 750 MG 24 hr tablet Take 1 tablet (750 mg total) by mouth 2 (two) times daily. 180 tablet 3  . ONETOUCH VERIO test strip 1 each by Other route as needed.     . pioglitazone (ACTOS) 15 MG tablet Take 15 mg by mouth daily.     . propranolol ER (INDERAL LA) 60 MG 24 hr capsule Take 1 capsule (60 mg total) by mouth daily. 90 capsule 1  . rosuvastatin (CRESTOR) 5 MG tablet Take 1 tablet by mouth daily.    . tamsulosin (FLOMAX) 0.4 MG CAPS capsule Take 1 tablet once a day for prostate    . traZODone (DESYREL) 50 MG tablet Take 0.5-1 tablets (25-50 mg total) by mouth at bedtime as needed for sleep. 90 tablet 3  . guaiFENesin-codeine 100-10 MG/5ML syrup Take 5 mLs by mouth every 6 (six) hours as needed for cough. 120 mL 0  . predniSONE (DELTASONE) 10 MG tablet Take 3 tablets (30 mg total) by mouth daily with breakfast. 15 tablet 0   No current facility-administered medications for this visit.    Allergies  Allergen Reactions  . Statins     Muscle cramping all over body. Lipitor, Crestor, and  pravastatin.  Verdis Prime [Liraglutide] Other (See Comments)    Caused pancreatitis    Health Maintenance Health Maintenance  Topic Date Due  . Hepatitis C Screening  1950-08-02  . HIV Screening  11/[birth year]/1966  . HEMOGLOBIN A1C  12/26/2017  . INFLUENZA VACCINE  12/29/2017  . OPHTHALMOLOGY EXAM  01/19/2018  . FOOT EXAM  07/12/2018  . PNA vac Low Risk Adult (2 of 2 - PPSV23) 07/30/2020  . TETANUS/TDAP  07/30/2025  . COLONOSCOPY  02/23/2026     Exam:  BP (!) 147/82   Pulse 84   Temp 98.1 F (36.7 C) (Oral)   Ht  (1.778 m)   Wt 284 lb (128.8 kg)   SpO2 95%   BMI 40.75 kg/m  Gen: Well NAD HEENT: EOMI,   MMM clear nasal discharge. No increased JVD.  Lungs: Normal work of breathing. Course breath sounds BL.  Heart: RRR no MRG Abd: NABS, Soft. Nondistended, Nontender Exts: Brisk capillary refill, warm and well perfused.   2v CXR my personal independent evaluation of images: Concern for infiltrate right lower lung. No other acute findings.  Awaiting formal radiology review.     Assessment and Plan: 67 y.o. male with cough and congestion. Concerning for viral bronchitis. CXR concerning for PNA likely secondary.  Plan for treatment with codeine cough suppression and  Prednisone. Will also send in omnicef.  Awaiting radiology read. Recheck if not better.    Orders Placed This Encounter  Procedures  . DG Chest 2 View    Order Specific Question:   Reason for exam:    Answer:   Cough, assess intra-thoracic pathology    Order Specific Question:   Preferred imaging location?    Answer:   [facility]   Meds ordered this encounter  Medications  . DISCONTD: ipratropium-albuterol (DUONEB) 0.5-2.5 (3) MG/3ML nebulizer solution 3 mL  . ipratropium-albuterol (DUONEB) 0.5-2.5 (3) MG/3ML nebulizer solution 3 mL  . guaiFENesin-codeine 100-10 MG/5ML syrup    Sig: Take 5 mLs by mouth every 6 (six) hours as needed for cough.    Dispense:  120 mL    Refill:  0  . predniSONE (DELTASONE) 10 MG tablet    Sig: Take 3 tablets (30 mg total) by mouth daily with breakfast.    Dispense:  15 tablet    Refill:  0     Discussed warning signs or symptoms. Please see discharge instructions. Patient expresses understanding.

## 2017-10-04 NOTE — Telephone Encounter (Signed)
Pt advised.

## 2017-10-04 NOTE — Telephone Encounter (Signed)
I looked at the chest xray and I am worried about pneumonia.  We are still waiting on radiology but I sent in antibiotics to your pharmacy.

## 2017-10-25 ENCOUNTER — Ambulatory Visit (INDEPENDENT_AMBULATORY_CARE_PROVIDER_SITE_OTHER): Payer: Medicare Other | Admitting: Family Medicine

## 2017-10-25 VITALS — BP 114/74 | HR 86 | Temp 98.1°F | Wt 283.0 lb

## 2017-10-25 DIAGNOSIS — G8929 Other chronic pain: Secondary | ICD-10-CM

## 2017-10-25 DIAGNOSIS — N451 Epididymitis: Secondary | ICD-10-CM | POA: Diagnosis not present

## 2017-10-25 DIAGNOSIS — M545 Low back pain: Secondary | ICD-10-CM | POA: Diagnosis not present

## 2017-10-25 DIAGNOSIS — R053 Chronic cough: Secondary | ICD-10-CM

## 2017-10-25 DIAGNOSIS — S81802A Unspecified open wound, left lower leg, initial encounter: Secondary | ICD-10-CM | POA: Diagnosis not present

## 2017-10-25 DIAGNOSIS — R05 Cough: Secondary | ICD-10-CM

## 2017-10-25 MED ORDER — GUAIFENESIN-CODEINE 100-10 MG/5ML PO SOLN: 5.0000 mL | Freq: Four times a day (QID) | ORAL | 0 refills | Status: DC | PRN

## 2017-10-25 MED ORDER — OMEPRAZOLE 40 MG PO CPDR: 40.0000 mg | DELAYED_RELEASE_CAPSULE | Freq: Every day | ORAL | 1 refills | Status: DC

## 2017-10-25 MED ORDER — HYDROCODONE-ACETAMINOPHEN 5-325 MG PO TABS: 1.0000 | ORAL_TABLET | Freq: Three times a day (TID) | ORAL | 0 refills | Status: DC | PRN

## 2017-10-25 NOTE — Progress Notes (Signed)
Gregory Governor Rooks [name] is a 67 y.o. male who presents to [name]: [facility] today for follow-up cough.   Gregory West has had a cough waxing and waning since February.  Both [name] and his wife became sick with what was likely a viral illness at that point.  Since then has had a cough.  The cough is quite obnoxious at times and interferes with sleep.  He had several visits for this and had a chest x-ray recently that showed a bronchitic pattern.  He has had trials of over-the-counter cough and allergy medicines, albuterol, codeine-containing cough medications, and antibiotics.  He notes the albuterol is helped a bit.  Additionally the codeine-containing cough medication does allow him to sleep and the antibiotics did not help much at all.  He denies significant acid reflux but does note some postnasal drainage that is now improving.  He notes occasional wheezing but denies severe shortness of breath chest pain or palpitations.  Additionally he notes a wound on his left leg that is healing.  About a week ago he hit his shin on an object and notes that the wound started to heal with topical antibiotic ointment.  He did notes that is not very painful.  Lastly he notes chronic back pain.  This is managed with very intermittent hydrocodone.  He would like a refill if possible.   ROS as above:  Exam:  BP 114/74   Pulse 86   Temp 98.1 F (36.7 C)   Wt 283 lb (128.4 kg)   SpO2 95%   BMI 40.61 kg/m  Gen: Well NAD HEENT: EOMI,  MMM normal posterior pharynx.  Nasal turbinates mildly inflamed with clear discharge present.  Normal tympanic membranes.  No cervical lymphadenopathy. Lungs: Normal work of breathing. CTABL Heart: RRR no MRG Abd: NABS, Soft. Nondistended, Nontender Exts: Brisk capillary refill, warm and well perfused.  Well-appearing healing wound left shin.  Trace edema  bilateral lower extremities.      Assessment and Plan: 67 y.o. male with  Cough: Patient is currently experiencing chronic cough.  The etiology is somewhat unclear.  It has been going on now for over 3 months.  I think is reasonable to refer to pulmonology for further testing.  We will broaden treatment to include PPI for possible silent reflux.  Continue using over-the-counter allergy and cough medications.  Use codeine-containing cough medications at bedtime as needed.  Leg wound: Doing quite well.  Continue topical Vaseline as well as compression stockings.  Recheck as needed.  Chronic back pain: Doing quite well.  Very intermittent use of Norco.  Recheck as needed.  Patient has a follow-up appointment scheduled for July.  This is a good check back point for several of these issues including diabetes.   Orders Placed This Encounter  Procedures  . Ambulatory referral to Pulmonology    Referral Priority:   Routine    Referral Type:   Consultation    Referral Reason:   Specialty Services Required    Requested Specialty:   Pulmonary Disease    Number of Visits Requested:   1   Meds ordered this encounter  Medications  . guaiFENesin-codeine 100-10 MG/5ML syrup    Sig: Take 5 mLs by mouth every 6 (six) hours as needed for cough. Do not take with hydrocodone pain medicine    Dispense:  236 mL    Refill:  0  . HYDROcodone-acetaminophen (NORCO/VICODIN) 5-325 MG tablet  Sig: Take 1 tablet by mouth every 8 (eight) hours as needed for moderate pain. Do not take with codeine cough medicine    Dispense:  15 tablet    Refill:  0  . omeprazole (PRILOSEC) 40 MG capsule    Sig: Take 1 capsule (40 mg total) by mouth daily.    Dispense:  90 capsule    Refill:  1     Historical information moved to improve visibility of documentation.  Past Medical History:  Diagnosis Date  . Arthritis   . Chest pain    a. Normal cors 2001. b. Neg stress test 2012, 05/2014.  . Diabetes mellitus  without complication (HCC)   . Hyperlipidemia   . Hypertensive response to exercise   . Obesity   . Obstructive sleep apnea    Currently untreated    Past Surgical History:  Procedure Laterality Date  . CERVICAL FUSION    . HERNIA REPAIR    . KNEE ARTHROSCOPY  1984   both knee   Social History   Tobacco Use  . Smoking status: Never Smoker  . Smokeless tobacco: Never Used  Substance Use Topics  . Alcohol use: No   family history includes Cancer in his father; Diabetes in his maternal grandfather, maternal grandmother, mother, and sister; Heart attack in his maternal grandfather, maternal grandmother, mother, paternal grandfather, and paternal grandmother; Heart disease in his father and mother; Hyperlipidemia in his sister; Hypertension in his maternal grandfather, mother, sister, and sister; Stroke in his maternal grandmother and mother.  Medications: Current Outpatient Medications  Medication Sig Dispense Refill  . Cholecalciferol (VITAMIN D3) 5000 units CAPS Take 1 capsule by mouth daily.    Marland Kitchen aspirin 81 MG tablet Take 81 mg by mouth daily.    . Empagliflozin-Linagliptin 25-5 MG TABS Take 1 tablet by mouth daily. 30 tablet 11  . guaiFENesin-codeine 100-10 MG/5ML syrup Take 5 mLs by mouth every 6 (six) hours as needed for cough. Do not take with hydrocodone pain medicine 236 mL 0  . HYDROcodone-acetaminophen (NORCO/VICODIN) 5-325 MG tablet Take 1 tablet by mouth every 8 (eight) hours as needed for moderate pain. Do not take with codeine cough medicine 15 tablet 0  . insulin glargine (LANTUS) 100 UNIT/ML injection Inject 40 Units into the skin 2 (two) times daily.    Marland Kitchen losartan (COZAAR) 25 MG tablet Take 1 tablet (25 mg total) by mouth daily. 90 tablet 1  . metFORMIN (GLUCOPHAGE XR) 750 MG 24 hr tablet Take 1 tablet (750 mg total) by mouth 2 (two) times daily. 180 tablet 3  . omeprazole (PRILOSEC) 40 MG capsule Take 1 capsule (40 mg total) by mouth daily. 90 capsule 1  . ONETOUCH  VERIO test strip 1 each by Other route as needed.     . pioglitazone (ACTOS) 15 MG tablet Take 15 mg by mouth daily.     . propranolol ER (INDERAL LA) 60 MG 24 hr capsule Take 1 capsule (60 mg total) by mouth daily. 90 capsule 1  . rosuvastatin (CRESTOR) 5 MG tablet Take 1 tablet by mouth daily.    . tamsulosin (FLOMAX) 0.4 MG CAPS capsule Take 1 tablet once a day for prostate    . traZODone (DESYREL) 50 MG tablet Take 0.5-1 tablets (25-50 mg total) by mouth at bedtime as needed for sleep. 90 tablet 3   No current facility-administered medications for this visit.    Allergies  Allergen Reactions  . Statins     Muscle cramping all  over body. Lipitor, Crestor, and pravastatin.  Verdis Prime [Liraglutide] Other (See Comments)    Caused pancreatitis    Health Maintenance Health Maintenance  Topic Date Due  . Hepatitis C Screening  24-Mar-1951  . HIV Screening  11/24/1966  . HEMOGLOBIN A1C  12/26/2017  . INFLUENZA VACCINE  12/29/2017  . OPHTHALMOLOGY EXAM  01/19/2018  . FOOT EXAM  07/12/2018  . PNA vac Low Risk Adult (2 of 2 - PPSV23) 07/30/2020  . TETANUS/TDAP  07/30/2025  . COLONOSCOPY  02/23/2026    Discussed warning signs or symptoms. Please see discharge instructions. Patient expresses understanding.

## 2017-10-25 NOTE — Patient Instructions (Signed)
Thank you for coming in today. Use the cough medicine .  You should hear form pulmonology about appointment.  Use over the counter allergy medicine.  Use acid blocking medicine.  Recheck as scheduled in July.  Recheck sooner if needed.    Cough, Adult Coughing is a reflex that clears your throat and your airways. Coughing helps to heal and protect your lungs. It is normal to cough occasionally, but a cough that happens with other symptoms or lasts a long time may be a sign of a condition that needs treatment. A cough may last only 2-3 weeks (acute), or it may last longer than 8 weeks (chronic). What are the causes? Coughing is commonly caused by:  Breathing in substances that irritate your lungs.  A viral or bacterial respiratory infection.  Allergies.  Asthma.  Postnasal drip.  Smoking.  Acid backing up from the stomach into the esophagus (gastroesophageal reflux).  Certain medicines.  Chronic lung problems, including COPD (or rarely, lung cancer).  Other medical conditions such as heart failure.  Follow these instructions at home: Pay attention to any changes in your symptoms. Take these actions to help with your discomfort:  Take medicines only as told by your health care provider. ? If you were prescribed an antibiotic medicine, take it as told by your health care provider. Do not stop taking the antibiotic even if you start to feel better. ? Talk with your health care provider before you take a cough suppressant medicine.  Drink enough fluid to keep your urine clear or pale yellow.  If the air is dry, use a cold steam vaporizer or humidifier in your bedroom or your home to help loosen secretions.  Avoid anything that causes you to cough at work or at home.  If your cough is worse at night, try sleeping in a semi-upright position.  Avoid cigarette smoke. If you smoke, quit smoking. If you need help quitting, ask your health care provider.  Avoid  caffeine.  Avoid alcohol.  Rest as needed.  Contact a health care provider if:  You have new symptoms.  You cough up pus.  Your cough does not get better after 2-3 weeks, or your cough gets worse.  You cannot control your cough with suppressant medicines and you are losing sleep.  You develop pain that is getting worse or pain that is not controlled with pain medicines.  You have a fever.  You have unexplained weight loss.  You have night sweats. Get help right away if:  You cough up blood.  You have difficulty breathing.  Your heartbeat is very fast. This information is not intended to replace advice given to you by your health care provider. Make sure you discuss any questions you have with your health care provider. Document Released: 11/13/2010 Document Revised: 10/23/2015 Document Reviewed: 07/24/2014 Elsevier Interactive Patient Education  [employer].

## 2017-10-31 ENCOUNTER — Encounter: Payer: Self-pay | Admitting: Pulmonary Disease

## 2017-10-31 ENCOUNTER — Ambulatory Visit (INDEPENDENT_AMBULATORY_CARE_PROVIDER_SITE_OTHER): Payer: Medicare Other | Admitting: Pulmonary Disease

## 2017-10-31 ENCOUNTER — Encounter (INDEPENDENT_AMBULATORY_CARE_PROVIDER_SITE_OTHER): Payer: Self-pay

## 2017-10-31 VITALS — BP 120/64 | HR 89 | Ht 70.0 in | Wt 285.1 lb

## 2017-10-31 DIAGNOSIS — R05 Cough: Secondary | ICD-10-CM | POA: Diagnosis not present

## 2017-10-31 DIAGNOSIS — J471 Bronchiectasis with (acute) exacerbation: Secondary | ICD-10-CM | POA: Diagnosis not present

## 2017-10-31 DIAGNOSIS — K219 Gastro-esophageal reflux disease without esophagitis: Secondary | ICD-10-CM

## 2017-10-31 DIAGNOSIS — R059 Cough, unspecified: Secondary | ICD-10-CM

## 2017-10-31 NOTE — Progress Notes (Signed)
Synopsis: Referred in June 2019 for chronic cough, he has a past medical history significant for gastroesophageal reflux disease.  Subjective:   PATIENT ID: Gregory Ready [name] GENDER: male DOB: 24-Jan-1951, MRN: [id number]   HPI  Chief Complaint  Patient presents with  . pulmonary consult    referred by Dr. Denyse Amass for cough   This is a pleasant 67 year old male who comes to my clinic today for evaluation of cough.  He says it as a child he had recurrent episodes of bronchitis and this has been a problem for him for most of his life.  He says that typically he will get bronchitis every 4 to 6 months or so and have to come to the doctor for management.  He says that this did not improve when he joined the [employer] and started smoking cigarettes at a rate of 1.5 packs/day.  He did that for a total of 30 years and quit in the early 90s.  He has worked as a Quarry manager for decades, he started while working in [employer].  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  He says that this spring has been particularly bad for him.  He had 2 episodes of bronchitis and he said a persistent cough ever since.  His wife says that he coughs "all day".  He has not noted a specific time of day when the cough is worse.  Sometimes foods that contain acidic components will make him cough a bit more.  The cough is been productive of yellow to brown mucus.  He says for the last 2 weeks he has continued to cough up brown mucus.  No fevers or chills.  He has some itching eyes but no scratchy throat or postnasal drip.  He says that his sinuses are typically dry.  He does have acid reflux and he recently started taking an antacid which has helped with this.  However, he says that the cough has not improved since taking the  antacid.    Past Medical History:  Diagnosis Date  . Arthritis   . Chest pain    a. Normal cors 2001. b. Neg stress test 2012, 05/2014.  . Diabetes mellitus without complication (HCC)   . Hyperlipidemia   . Hypertensive response to exercise   . Obesity   . Obstructive sleep apnea    Currently untreated      Family History  Problem Relation Age of Onset  . Diabetes Mother   . Heart disease Mother   . Hypertension Mother   . Stroke Mother   . Cancer Father   . Heart disease Father   . Hyperlipidemia Sister   . Hypertension Sister   . Diabetes Maternal Grandmother   . Stroke Maternal Grandmother   . Diabetes Maternal Grandfather   . Hypertension Maternal Grandfather   . Diabetes Sister   . Hypertension Sister   . Heart attack Mother   . Heart attack Maternal Grandmother   . Heart attack Maternal Grandfather   . Heart attack Paternal Grandmother   . Heart attack Paternal Grandfather      Social History   Socioeconomic History  . Marital status: Married    Spouse name: Not on file  . Number of children: Not on file  . Years of  education: Not on file  . Highest education level: Not on file  Occupational History  . Not on file  Social Needs  . Financial resource strain: Not on file  . Food insecurity:    Worry: Not on file    Inability: Not on file  . Transportation needs:    Medical: Not on file    Non-medical: Not on file  Tobacco Use  . Smoking status: Former Smoker    Packs/day: 1.50    Years: 30.00    Pack years: 45.00    Types: Cigarettes    Last attempt to quit: 1990    Years since quitting: 29.4  . Smokeless tobacco: Never Used  Substance and Sexual Activity  . Alcohol use: No  . Drug use: No  . Sexual activity: Not on file  Lifestyle  . Physical activity:    Days per week: Not on file    Minutes per session: Not on file  . Stress: Not on file  Relationships  . Social connections:    Talks on phone: Not on file    Gets together: Not on  file    Attends religious service: Not on file    Active member of club or organization: Not on file    Attends meetings of clubs or organizations: Not on file    Relationship status: Not on file  . Intimate partner violence:    Fear of current or ex partner: Not on file    Emotionally abused: Not on file    Physically abused: Not on file    Forced sexual activity: Not on file  Other Topics Concern  . Not on file  Social History Narrative  . Not on file     Allergies  Allergen Reactions  . Statins     Muscle cramping all over body. Lipitor, Crestor, and pravastatin.  Verdis Prime [Liraglutide] Other (See Comments)    Caused pancreatitis     Outpatient Medications Prior to Visit  Medication Sig Dispense Refill  . aspirin 81 MG tablet Take 81 mg by mouth daily.    . Cholecalciferol (VITAMIN D3) 5000 units CAPS Take 1 capsule by mouth daily.    . Empagliflozin-Linagliptin 25-5 MG TABS Take 1 tablet by mouth daily. 30 tablet 11  . guaiFENesin-codeine 100-10 MG/5ML syrup Take 5 mLs by mouth every 6 (six) hours as needed for cough. Do not take with hydrocodone pain medicine 236 mL 0  . HYDROcodone-acetaminophen (NORCO/VICODIN) 5-325 MG tablet Take 1 tablet by mouth every 8 (eight) hours as needed for moderate pain. Do not take with codeine cough medicine 15 tablet 0  . insulin glargine (LANTUS) 100 UNIT/ML injection Inject 40 Units into the skin 2 (two) times daily.    Marland Kitchen losartan (COZAAR) 25 MG tablet Take 1 tablet (25 mg total) by mouth daily. 90 tablet 1  . metFORMIN (GLUCOPHAGE XR) 750 MG 24 hr tablet Take 1 tablet (750 mg total) by mouth 2 (two) times daily. 180 tablet 3  . omeprazole (PRILOSEC) 40 MG capsule Take 1 capsule (40 mg total) by mouth daily. 90 capsule 1  . ONETOUCH VERIO test strip 1 each by Other route as needed.     . pioglitazone (ACTOS) 15 MG tablet Take 15 mg by mouth daily.     . propranolol ER (INDERAL LA) 60 MG 24 hr capsule Take 1 capsule (60 mg total) by mouth  daily. 90 capsule 1  . rosuvastatin (CRESTOR) 5 MG tablet Take 1 tablet by  mouth daily.    . tamsulosin (FLOMAX) 0.4 MG CAPS capsule Take 1 tablet once a day for prostate    . traZODone (DESYREL) 50 MG tablet Take 0.5-1 tablets (25-50 mg total) by mouth at bedtime as needed for sleep. 90 tablet 3   No facility-administered medications prior to visit.     Review of Systems  Constitutional: Negative for chills, fever, malaise/fatigue and weight loss.  HENT: Negative for congestion, nosebleeds, sinus pain and sore throat.   Eyes: Negative for photophobia, pain and discharge.  Respiratory: Positive for cough and sputum production. Negative for hemoptysis, shortness of breath and wheezing.   Cardiovascular: Negative for chest pain, palpitations, orthopnea and leg swelling.  Gastrointestinal: Negative for abdominal pain, constipation, diarrhea, nausea and vomiting.  Genitourinary: Negative for dysuria, frequency, hematuria and urgency.  Musculoskeletal: Negative for back pain, joint pain, myalgias and neck pain.  Skin: Negative for itching and rash.  Neurological: Negative for tingling, tremors, sensory change, speech change, focal weakness, seizures, weakness and headaches.  Psychiatric/Behavioral: Negative for memory loss, substance abuse and suicidal ideas. The patient is not nervous/anxious.       Objective:  Physical Exam   Vitals:   10/31/17 1555  BP: 120/64  Pulse: 89  SpO2: 97%  Weight: 285 lb 1.9 oz (129.3 kg)  Height: 5\' 10"  (1.778 m)   RA  Gen: obese but well appearing, no acute distress HENT: NCAT, OP clear, neck supple without masses Eyes: PERRL, EOMi Lymph: no cervical lymphadenopathy PULM: CTA B CV: RRR, no mgr, no JVD GI: BS+, soft, nontender, no hsm Derm: no rash or skin breakdown MSK: normal bulk and tone Neuro: A&Ox4, CN II-XII intact, strength 5/5 in all 4 extremities Psyche: normal mood and affect   CBC    Component Value Date/Time   WBC 4.4  08/18/2017 1221   RBC 4.91 08/18/2017 1221   HGB 14.8 08/18/2017 1221   HCT 43.0 08/18/2017 1221   PLT 180 08/18/2017 1221   MCV 87.6 08/18/2017 1221   MCV 89 04/01/2015   MCV 89 04/01/2015   MCH 30.1 08/18/2017 1221   MCHC 34.4 08/18/2017 1221   RDW 13.2 08/18/2017 1221   LYMPHSABS 1,720 03/08/2017 1124   MONOABS 0.5 06/25/2015 1805   EOSABS 110 03/08/2017 1124   BASOSABS 20 03/08/2017 1124     Chest imaging: Oct 04, 2017 chest x-ray images independently reviewed showing airway thickening in the bases bilaterally  PFT:  Labs:  Path:  Echo:  Heart Catheterization:  Records from his visit with his primary care physician reviewed where chronic cough was discussed.  He has some symptoms of gastroesophageal reflux disease as well as postnasal drip.     Assessment & Plan:   Gastroesophageal reflux disease, esophagitis presence not specified  Bronchiectasis with acute exacerbation (HCC) - Plan: CT CHEST HIGH RESOLUTION, CANCELED: CT CHEST HIGH RESOLUTION  Cough - Plan: Pulmonary Function Test, CANCELED: CT CHEST HIGH RESOLUTION  Discussion: Mr. Tomasa RandCunningham presents for evaluation of a chronic cough which is likely due to cyclical cough from repeated episodes of bronchitis causing ongoing laryngeal irritation further exacerbated by gastroesophageal reflux disease.  However, it puzzles me why he has been coughing up mucus and his reported history of recurrent bronchitis many times throughout his life raises concern for bronchiectasis.  In addition, he smoked for many years so I am concerned about a diagnosis of COPD.  In order to evaluate further we need to get a CT scan and a lung function test.  I think we can probably help him with a dry hacking cough by encouraging him to rest his voice to help heal some of the laryngeal irritation from cyclical cough.  Plan: Cough with mucus production: I am concerned about the fact that you have had recurrent episodes of bronchitis over  the years. Given your smoking history I am concerned about the possibility of COPD or a condition called bronchiectasis: We are going to check a lung function test as well as a CT scan of the chest to investigate further.  Dry hacking cough in the setting of gastroesophageal reflux disease: Keep taking the antacid you were prescribed You need to try to suppress your cough to allow your larynx (voice box) to heal.  For three days don't talk, laugh, sing, or clear your throat. Do everything you can to suppress the cough during this time. Use hard candies (sugarless Jolly Ranchers) or non-mint or non-menthol containing cough drops during this time to soothe your throat.  Use a cough suppressant (Delsym or what I have prescribed you) around the clock during this time.  After three days, gradually increase the use of your voice and back off on the cough suppressants.  We will see you back in 2-3 weeks to go over these results    Current Outpatient Medications:  .  aspirin 81 MG tablet, Take 81 mg by mouth daily., Disp: , Rfl:  .  Cholecalciferol (VITAMIN D3) 5000 units CAPS, Take 1 capsule by mouth daily., Disp: , Rfl:  .  Empagliflozin-Linagliptin 25-5 MG TABS, Take 1 tablet by mouth daily., Disp: 30 tablet, Rfl: 11 .  guaiFENesin-codeine 100-10 MG/5ML syrup, Take 5 mLs by mouth every 6 (six) hours as needed for cough. Do not take with hydrocodone pain medicine, Disp: 236 mL, Rfl: 0 .  HYDROcodone-acetaminophen (NORCO/VICODIN) 5-325 MG tablet, Take 1 tablet by mouth every 8 (eight) hours as needed for moderate pain. Do not take with codeine cough medicine, Disp: 15 tablet, Rfl: 0 .  insulin glargine (LANTUS) 100 UNIT/ML injection, Inject 40 Units into the skin 2 (two) times daily., Disp: , Rfl:  .  losartan (COZAAR) 25 MG tablet, Take 1 tablet (25 mg total) by mouth daily., Disp: 90 tablet, Rfl: 1 .  metFORMIN (GLUCOPHAGE XR) 750 MG 24 hr tablet, Take 1 tablet (750 mg total) by mouth 2 (two) times  daily., Disp: 180 tablet, Rfl: 3 .  omeprazole (PRILOSEC) 40 MG capsule, Take 1 capsule (40 mg total) by mouth daily., Disp: 90 capsule, Rfl: 1 .  ONETOUCH VERIO test strip, 1 each by Other route as needed. , Disp: , Rfl:  .  pioglitazone (ACTOS) 15 MG tablet, Take 15 mg by mouth daily. , Disp: , Rfl:  .  propranolol ER (INDERAL LA) 60 MG 24 hr capsule, Take 1 capsule (60 mg total) by mouth daily., Disp: 90 capsule, Rfl: 1 .  rosuvastatin (CRESTOR) 5 MG tablet, Take 1 tablet by mouth daily., Disp: , Rfl:  .  tamsulosin (FLOMAX) 0.4 MG CAPS capsule, Take 1 tablet once a day for prostate, Disp: , Rfl:  .  traZODone (DESYREL) 50 MG tablet, Take 0.5-1 tablets (25-50 mg total) by mouth at bedtime as needed for sleep., Disp: 90 tablet, Rfl: 3

## 2017-10-31 NOTE — Progress Notes (Deleted)
   Subjective:    Patient ID: Gregory Readyichard A Velasques [name], male    DOB: 12/08/1950, 67 y.o.   MRN: [id number]  HPI    Review of Systems  Constitutional: Positive for fatigue.  HENT: Positive for congestion, sneezing and sore throat.   Respiratory: Positive for cough, chest tightness, shortness of breath and wheezing.   Neurological: Positive for light-headedness.       Objective:   Physical Exam        Assessment & Plan:

## 2017-10-31 NOTE — Patient Instructions (Signed)
Cough with mucus production: I am concerned about the fact that you have had recurrent episodes of bronchitis over the years. Given your smoking history I am concerned about the possibility of COPD or a condition called bronchiectasis: We are going to check a lung function test as well as a CT scan of the chest to investigate further.  Dry hacking cough in the setting of gastroesophageal reflux disease: Keep taking the antacid you were prescribed You need to try to suppress your cough to allow your larynx (voice box) to heal.  For three days don't talk, laugh, sing, or clear your throat. Do everything you can to suppress the cough during this time. Use hard candies (sugarless Jolly Ranchers) or non-mint or non-menthol containing cough drops during this time to soothe your throat.  Use a cough suppressant (Delsym or what I have prescribed you) around the clock during this time.  After three days, gradually increase the use of your voice and back off on the cough suppressants.  We will see you back in 2-3 weeks to go over these results

## 2017-11-04 ENCOUNTER — Ambulatory Visit (INDEPENDENT_AMBULATORY_CARE_PROVIDER_SITE_OTHER): Payer: Medicare Other

## 2017-11-04 ENCOUNTER — Ambulatory Visit (INDEPENDENT_AMBULATORY_CARE_PROVIDER_SITE_OTHER): Payer: Medicare Other | Admitting: Family Medicine

## 2017-11-04 ENCOUNTER — Encounter: Payer: Self-pay | Admitting: Family Medicine

## 2017-11-04 VITALS — BP 124/68 | HR 78 | Wt 283.0 lb

## 2017-11-04 DIAGNOSIS — M4803 Spinal stenosis, cervicothoracic region: Secondary | ICD-10-CM | POA: Diagnosis not present

## 2017-11-04 DIAGNOSIS — M542 Cervicalgia: Secondary | ICD-10-CM

## 2017-11-04 DIAGNOSIS — M47819 Spondylosis without myelopathy or radiculopathy, site unspecified: Secondary | ICD-10-CM

## 2017-11-04 DIAGNOSIS — M2578 Osteophyte, vertebrae: Secondary | ICD-10-CM

## 2017-11-04 DIAGNOSIS — W19XXXA Unspecified fall, initial encounter: Secondary | ICD-10-CM

## 2017-11-04 DIAGNOSIS — S199XXA Unspecified injury of neck, initial encounter: Secondary | ICD-10-CM | POA: Diagnosis not present

## 2017-11-04 NOTE — Patient Instructions (Signed)
Thank you for coming in today. Take it easy this weekend.  Use existing medications for pain and spasm.  Heating pad will likely help.  If not getting better next week let me know and I will order PT.

## 2017-11-04 NOTE — Progress Notes (Signed)
Gregory Governor RooksA Urbanik [name] is Gregory 67 y.o. male who [facility] today for fall posterior neck pain.  Gregory West got up this morning at about 3:00 in the morning go to the bathroom.  He slipped on the carpet and fell backwards striking the posterior aspect of his neck against the bed rail.  He notes that he has pain at the central portion of his neck near C7.  He denies any radiating pain weakness or numbness down the upper or lower extremities.  He has no trouble walking or new bowel or bladder dysfunction.  His neck is sore with neck motion.  He notes Gregory pertinent past surgical history for neck fusion most recently in 2011.    ROS:  As above  Exam:  BP 124/68   Pulse 78   Wt 283 lb (128.4 kg)   SpO2 97%   BMI 40.61 kg/m  General: Well Developed, well nourished, and in no acute distress.  Neuro/Psych: Alert and oriented x3, extra-ocular muscles intact, able to move all 4 extremities, sensation grossly intact. Skin: Warm and dry, no rashes noted.  Respiratory: Not using accessory muscles, speaking in full sentences, trachea midline.  Cardiovascular: Pulses palpable, no extremity edema. Abdomen: Does not appear distended. MSK:  C-spine: Tender to palpation along cervical spine midline at C7  Range of motion rotation bilaterally is intact but somewhat limited.  Extension and flexion are mildly limited but only mildly painful. Upper extremity strength reflexes and sensation is equal normal throughout.   Lab and Radiology Results X-ray images from C-spine x-ray obtained today are personally independent reviewed by myself Multilevel fusion present at C3-4 and C4-5.  Degenerative changes present.  No acute fractures.  Old bone fragment or accessory ossicle present at posterior soft tissue between C6 and C7 spinous processes.  No acute fractures.  Awaiting formal radiology review  Assessment and Plan: 67 y.o. male with neck pain secondary  to fall and injury.  No fracture today.  Neurologically intact.  Plan for watchful waiting and treatment with over-the-counter medications as well as existing hydrocodone and Flexeril.  Use muscle relaxers as needed.  Recheck as needed.    Orders Placed This Encounter  Procedures  . DG Cervical Spine Complete    Standing Status:   Future    Number of Occurrences:   1    Standing Expiration Date:   01/05/2019    Order Specific Question:   Reason for Exam (SYMPTOM  OR DIAGNOSIS REQUIRED)    Answer:   eval posteror neck pain after fall    Order Specific Question:   Preferred imaging location?    Answer:   [facility]    Order Specific Question:   Radiology Contrast Protocol - do NOT remove file path    Answer:   [email or web address]   No orders of the defined types were placed in this encounter.   Historical information moved to improve visibility of documentation.  Past Medical History:  Diagnosis Date  . Arthritis   . Chest pain    Gregory. Normal cors 2001. b. Neg stress test 2012, 05/2014.  . Diabetes mellitus without complication (HCC)   . Hyperlipidemia   . Hypertensive response to exercise   . Obesity   . Obstructive sleep apnea    Currently untreated    Past Surgical History:  Procedure Laterality Date  . CERVICAL FUSION    . HERNIA REPAIR    . KNEE ARTHROSCOPY  1984  both knee   Social History   Tobacco Use  . Smoking status: Former Smoker    Packs/day: 1.50    Years: 30.00    Pack years: 45.00    Types: Cigarettes    Last attempt to quit: 1990    Years since quitting: 29.4  . Smokeless tobacco: Never Used  Substance Use Topics  . Alcohol use: No   family history includes Cancer in his father; Diabetes in his maternal grandfather, maternal grandmother, mother, and sister; Heart attack in his maternal grandfather, maternal grandmother, mother, paternal grandfather, and paternal grandmother; Heart disease in his father  and mother; Hyperlipidemia in his sister; Hypertension in his maternal grandfather, mother, sister, and sister; Stroke in his maternal grandmother and mother.  Medications: Current Outpatient Medications  Medication Sig Dispense Refill  . aspirin 81 MG tablet Take 81 mg by mouth daily.    . Cholecalciferol (VITAMIN D3) 5000 units CAPS Take 1 capsule by mouth daily.    . Empagliflozin-Linagliptin 25-5 MG TABS Take 1 tablet by mouth daily. 30 tablet 11  . guaiFENesin-codeine 100-10 MG/5ML syrup Take 5 mLs by mouth every 6 (six) hours as needed for cough. Do not take with hydrocodone pain medicine 236 mL 0  . HYDROcodone-acetaminophen (NORCO/VICODIN) 5-325 MG tablet Take 1 tablet by mouth every 8 (eight) hours as needed for moderate pain. Do not take with codeine cough medicine 15 tablet 0  . insulin glargine (LANTUS) 100 UNIT/ML injection Inject 40 Units into the skin 2 (two) times daily.    Marland Kitchen losartan (COZAAR) 25 MG tablet Take 1 tablet (25 mg total) by mouth daily. 90 tablet 1  . metFORMIN (GLUCOPHAGE XR) 750 MG 24 hr tablet Take 1 tablet (750 mg total) by mouth 2 (two) times daily. 180 tablet 3  . omeprazole (PRILOSEC) 40 MG capsule Take 1 capsule (40 mg total) by mouth daily. 90 capsule 1  . ONETOUCH VERIO test strip 1 each by Other route as needed.     . pioglitazone (ACTOS) 15 MG tablet Take 15 mg by mouth daily.     . propranolol ER (INDERAL LA) 60 MG 24 hr capsule Take 1 capsule (60 mg total) by mouth daily. 90 capsule 1  . rosuvastatin (CRESTOR) 5 MG tablet Take 1 tablet by mouth daily.    . tamsulosin (FLOMAX) 0.4 MG CAPS capsule Take 1 tablet once Gregory day for prostate    . traZODone (DESYREL) 50 MG tablet Take 0.5-1 tablets (25-50 mg total) by mouth at bedtime as needed for sleep. 90 tablet 3   No current facility-administered medications for this visit.    Allergies  Allergen Reactions  . Statins     Muscle cramping all over body. Lipitor, Crestor, and pravastatin.  Verdis Prime  [Liraglutide] Other (See Comments)    Caused pancreatitis      Discussed warning signs or symptoms. Please see discharge instructions. Patient expresses understanding.

## 2017-11-07 ENCOUNTER — Ambulatory Visit (HOSPITAL_BASED_OUTPATIENT_CLINIC_OR_DEPARTMENT_OTHER)
Admission: RE | Admit: 2017-11-07 | Discharge: 2017-11-07 | Disposition: A | Discharge status: Home / Self Care | Payer: Medicare Other | Source: Ambulatory Visit | Attending: Pulmonary Disease | Admitting: Pulmonary Disease

## 2017-11-07 DIAGNOSIS — R918 Other nonspecific abnormal finding of lung field: Secondary | ICD-10-CM | POA: Diagnosis not present

## 2017-11-07 DIAGNOSIS — J471 Bronchiectasis with (acute) exacerbation: Secondary | ICD-10-CM | POA: Insufficient documentation

## 2017-11-07 DIAGNOSIS — I700 Atherosclerosis of aorta: Secondary | ICD-10-CM | POA: Insufficient documentation

## 2017-11-07 DIAGNOSIS — I358 Other nonrheumatic aortic valve disorders: Secondary | ICD-10-CM | POA: Diagnosis not present

## 2017-11-29 DIAGNOSIS — Z1509 Genetic susceptibility to other malignant neoplasm: Secondary | ICD-10-CM | POA: Diagnosis not present

## 2017-11-29 DIAGNOSIS — Z808 Family history of malignant neoplasm of other organs or systems: Secondary | ICD-10-CM | POA: Diagnosis not present

## 2017-12-07 ENCOUNTER — Ambulatory Visit (INDEPENDENT_AMBULATORY_CARE_PROVIDER_SITE_OTHER): Payer: Medicare Other | Admitting: Pulmonary Disease

## 2017-12-07 ENCOUNTER — Other Ambulatory Visit: Payer: Self-pay | Admitting: Family Medicine

## 2017-12-07 DIAGNOSIS — R05 Cough: Secondary | ICD-10-CM

## 2017-12-07 DIAGNOSIS — R059 Cough, unspecified: Secondary | ICD-10-CM

## 2017-12-07 LAB — PULMONARY FUNCTION TEST
DL/VA % PRED: 142 %
DL/VA: 6.58 ml/min/mmHg/L
DLCO unc % pred: 83 %
DLCO unc: 26.98 ml/min/mmHg
FEF 25-75 POST: 4.35 L/s
FEF 25-75 Pre: 2.89 L/sec
FEF2575-%Change-Post: 50 %
FEF2575-%Pred-Post: 163 %
FEF2575-%Pred-Pre: 108 %
FEV1-%CHANGE-POST: 14 %
FEV1-%PRED-PRE: 69 %
FEV1-%Pred-Post: 78 %
FEV1-PRE: 2.06 L
FEV1-Post: 2.36 L
FEV1FVC-%CHANGE-POST: 2 %
FEV1FVC-%Pred-Pre: 113 %
FEV6-%Change-Post: 11 %
FEV6-%PRED-PRE: 63 %
FEV6-%Pred-Post: 70 %
FEV6-PRE: 2.37 L
FEV6-Post: 2.66 L
FEV6FVC-%Pred-Post: 104 %
FEV6FVC-%Pred-Pre: 104 %
FVC-%Change-Post: 11 %
FVC-%PRED-POST: 67 %
FVC-%PRED-PRE: 60 %
FVC-POST: 2.66 L
FVC-PRE: 2.37 L
POST FEV6/FVC RATIO: 100 %
Post FEV1/FVC ratio: 89 %
Pre FEV1/FVC ratio: 87 %
Pre FEV6/FVC Ratio: 100 %
RV % pred: 70 %
RV: 1.66 L
TLC % pred: 61 %
TLC: 4.33 L

## 2017-12-07 NOTE — Progress Notes (Addendum)
PFT obtained 12/07/17 unable to complete PLETH portion of the test.  Patient fell asleep during breaks in between tests as well as during a test. Dr. Kendrick FriesMcquaid was made aware of this. Patient was offered to be taken home by EMS or to call someone. Patient declined both. Patient stated that he could safely get home. This has been sent to Dr. Kendrick FriesMcquaid.

## 2017-12-08 ENCOUNTER — Ambulatory Visit (INDEPENDENT_AMBULATORY_CARE_PROVIDER_SITE_OTHER): Payer: Medicare Other | Admitting: Pulmonary Disease

## 2017-12-08 ENCOUNTER — Encounter: Payer: Self-pay | Admitting: Pulmonary Disease

## 2017-12-08 VITALS — BP 118/72 | HR 74 | Ht 70.0 in | Wt 286.0 lb

## 2017-12-08 DIAGNOSIS — J849 Interstitial pulmonary disease, unspecified: Secondary | ICD-10-CM | POA: Diagnosis not present

## 2017-12-08 DIAGNOSIS — K219 Gastro-esophageal reflux disease without esophagitis: Secondary | ICD-10-CM

## 2017-12-08 DIAGNOSIS — G4733 Obstructive sleep apnea (adult) (pediatric): Secondary | ICD-10-CM | POA: Diagnosis not present

## 2017-12-08 DIAGNOSIS — R059 Cough, unspecified: Secondary | ICD-10-CM

## 2017-12-08 DIAGNOSIS — R9389 Abnormal findings on diagnostic imaging of other specified body structures: Secondary | ICD-10-CM

## 2017-12-08 DIAGNOSIS — R05 Cough: Secondary | ICD-10-CM

## 2017-12-08 NOTE — Progress Notes (Signed)
Synopsis: Referred in June 2019 for chronic cough, he has a past medical history significant for gastroesophageal reflux disease.  He has obstructive sleep apnea.  A CT scan of his chest performed in 2019 showed nonspecific interstitial disease, very mild in the bases of his lungs.  Subjective:   PATIENT ID: Gregory West. GENDER: male DOB: 1950/11/02, MRN: [id number]   HPI  Chief Complaint  Patient presents with  . Follow-up    Pt had PFT yesterday prior to OV today. Pt cough has improved.   Theadore says he has had complete resolution of his symptoms.  Specifically he says that the cough eventually went away.  He has no more mucus production and no more cough. He says that he took the medications as we prescribed. We have noted that he is quite sleepy in the office both today and yesterday.  His wife says this is the way he is all the time.  He has obstructive sleep apnea but does not use his CPAP machine.  There have not been any changes to his medications or habits otherwise.   Past Medical History:  Diagnosis Date  . Arthritis   . Chest pain    a. Normal cors 2001. b. Neg stress test 2012, 05/2014.  . Diabetes mellitus without complication (HCC)   . Hyperlipidemia   . Hypertensive response to exercise   . Obesity   . Obstructive sleep apnea    Currently untreated      Review of Systems  Constitutional: Negative for chills, fever, malaise/fatigue and weight loss.  HENT: Negative for congestion, nosebleeds, sinus pain and sore throat.   Eyes: Negative for photophobia, pain and discharge.  Respiratory: Positive for cough and sputum production. Negative for hemoptysis, shortness of breath and wheezing.   Cardiovascular: Negative for chest pain, palpitations, orthopnea and leg swelling.  Gastrointestinal: Negative for abdominal pain, constipation, diarrhea, nausea and vomiting.  Genitourinary: Negative for dysuria, frequency, hematuria and urgency.  Musculoskeletal:  Negative for back pain, joint pain, myalgias and neck pain.  Skin: Negative for itching and rash.  Neurological: Negative for tingling, tremors, sensory change, speech change, focal weakness, seizures, weakness and headaches.  Psychiatric/Behavioral: Negative for memory loss, substance abuse and suicidal ideas. The patient is not nervous/anxious.       Objective:  Physical Exam   Vitals:   12/08/17 1601  BP: 118/72  Pulse: 74  SpO2: 93%  Weight: 286 lb (129.7 kg)  Height: 5\' 10"  (1.778 m)   RA  Gen: well appearing HENT: OP clear, TM's clear, neck supple PULM: CTA B, normal percussion CV: RRR, no mgr, trace edema GI: BS+, soft, nontender Derm: no cyanosis or rash Psyche: normal mood and affect   CBC    Component Value Date/Time   WBC 4.4 08/18/2017 1221   RBC 4.91 08/18/2017 1221   HGB 14.8 08/18/2017 1221   HCT 43.0 08/18/2017 1221   PLT 180 08/18/2017 1221   MCV 87.6 08/18/2017 1221   MCV 89 04/01/2015   MCV 89 04/01/2015   MCH 30.1 08/18/2017 1221   MCHC 34.4 08/18/2017 1221   RDW 13.2 08/18/2017 1221   LYMPHSABS 1,720 03/08/2017 1124   MONOABS 0.5 06/25/2015 1805   EOSABS 110 03/08/2017 1124   BASOSABS 20 03/08/2017 1124     Chest imaging: Oct 04, 2017 chest x-ray images independently reviewed showing airway thickening in the bases bilaterally June 2019 high-resolution CT scanning of the chest shows nonspecific patchy groundglass in the bases of  his lungs near the periphery, some reticulation but overall minimal changes.  The majority of his pulmonary parenchyma is within normal limits.  PFT: July 2019 lung volumes were limited because he kept falling asleep.  Forced vital capacity 2.66 L 67% predicted, normal ratio, DLCO 26.98 83% predicted  Labs:  Path:  Echo:  Heart Catheterization:  Records from his visit with his primary care physician reviewed where chronic cough was discussed.  He has some symptoms of gastroesophageal reflux disease as well  as postnasal drip.     Assessment & Plan:   Cough  Gastroesophageal reflux disease, esophagitis presence not specified  OSA (obstructive sleep apnea)  Abnormal CT of the chest  Discussion: Gerlene BurdockRichard has completely recovered from what I believe was an upper airway cough.  We have incidentally discovered nonspecific interstitial changes in his lungs.  These findings are quite mild and given the fact he has no symptoms I think are just incidental findings of no clinical significance.  However, because he had symptoms earlier this year I think it is best to follow these for 1 year.  Specifically we will repeat a CT scan of the chest 1 year from now as well as a lung function test.  Plan: Cough: I am glad this has resolved  Gastroesophageal reflux disease: Continue taking omeprazole  Nonspecific interstitial abnormality seen on the CT scan of your chest: Again, I think these are mild and nothing to be too concerned about.  However, I would like to follow them by repeating a lung function test and another CT scan in 1 year to make sure they do not get any worse.  Obstructive sleep apnea: I recommend that you restart CPAP, let us know if you need us to help with orders for the DME company.  However, it may be best to do coordinate this through Dr. Clarene Reamersborn's office as he was the one who performed the original study    Follow up in one year or sooner if needed    Current Outpatient Medications:  .  aspirin 81 MG tablet, Take 81 mg by mouth daily., Disp: , Rfl:  .  Cholecalciferol (VITAMIN D3) 5000 units CAPS, Take 1 capsule by mouth daily., Disp: , Rfl:  .  Empagliflozin-Linagliptin 25-5 MG TABS, Take 1 tablet by mouth daily., Disp: 30 tablet, Rfl: 11 .  guaiFENesin-codeine 100-10 MG/5ML syrup, Take 5 mLs by mouth every 6 (six) hours as needed for cough. Do not take with hydrocodone pain medicine, Disp: 236 mL, Rfl: 0 .  HYDROcodone-acetaminophen (NORCO/VICODIN) 5-325 MG tablet, Take 1  tablet by mouth every 8 (eight) hours as needed for moderate pain. Do not take with codeine cough medicine, Disp: 15 tablet, Rfl: 0 .  insulin glargine (LANTUS) 100 UNIT/ML injection, Inject 40 Units into the skin 2 (two) times daily., Disp: , Rfl:  .  losartan (COZAAR) 25 MG tablet, Take 1 tablet (25 mg total) by mouth daily., Disp: 90 tablet, Rfl: 1 .  metFORMIN (GLUCOPHAGE XR) 750 MG 24 hr tablet, Take 1 tablet (750 mg total) by mouth 2 (two) times daily., Disp: 180 tablet, Rfl: 3 .  omeprazole (PRILOSEC) 40 MG capsule, Take 1 capsule (40 mg total) by mouth daily., Disp: 90 capsule, Rfl: 1 .  ONETOUCH VERIO test strip, 1 each by Other route as needed. , Disp: , Rfl:  .  pioglitazone (ACTOS) 15 MG tablet, Take 15 mg by mouth daily. , Disp: , Rfl:  .  propranolol ER (INDERAL LA) 60 MG  24 hr capsule, Take 1 capsule (60 mg total) by mouth daily., Disp: 90 capsule, Rfl: 1 .  rosuvastatin (CRESTOR) 5 MG tablet, Take 1 tablet by mouth daily., Disp: , Rfl:  .  tamsulosin (FLOMAX) 0.4 MG CAPS capsule, Take 1 tablet once a day for prostate, Disp: , Rfl:  .  traZODone (DESYREL) 50 MG tablet, TAKE 1/2 TO 1 TABLET BY MOUTH AT BEDTIME AS NEEDED FOR SLEEP, Disp: 90 tablet, Rfl: 3

## 2017-12-08 NOTE — Patient Instructions (Signed)
Cough: I am glad this has resolved  Gastroesophageal reflux disease: Continue taking omeprazole  Nonspecific interstitial abnormality seen on the CT scan of your chest: Again, I think these are mild and nothing to be too concerned about.  However, I would like to follow them by repeating a lung function test and another CT scan in 1 year to make sure they do not get any worse.  Obstructive sleep apnea: I recommend that you restart CPAP, let us know if you need us to help with orders for the DME company.  However, it may be best to do coordinate this through Dr. Clarene Reamersborn's office as he was the one who performed the original study    Follow up in one year or sooner if needed

## 2017-12-09 ENCOUNTER — Other Ambulatory Visit: Payer: Self-pay | Admitting: Pulmonary Disease

## 2017-12-09 DIAGNOSIS — J849 Interstitial pulmonary disease, unspecified: Secondary | ICD-10-CM

## 2017-12-09 NOTE — Progress Notes (Signed)
high

## 2017-12-13 ENCOUNTER — Ambulatory Visit: Payer: PRIVATE HEALTH INSURANCE | Admitting: Family Medicine

## 2017-12-15 DIAGNOSIS — Z1509 Genetic susceptibility to other malignant neoplasm: Secondary | ICD-10-CM | POA: Diagnosis not present

## 2017-12-15 DIAGNOSIS — Z800 Family history of malignant neoplasm of digestive organs: Secondary | ICD-10-CM | POA: Diagnosis not present

## 2017-12-19 ENCOUNTER — Ambulatory Visit (INDEPENDENT_AMBULATORY_CARE_PROVIDER_SITE_OTHER): Payer: Medicare Other | Admitting: Podiatry

## 2017-12-19 DIAGNOSIS — B351 Tinea unguium: Secondary | ICD-10-CM | POA: Diagnosis not present

## 2017-12-19 DIAGNOSIS — E0800 Diabetes mellitus due to underlying condition with hyperosmolarity without nonketotic hyperglycemic-hyperosmolar coma (NKHHC): Secondary | ICD-10-CM

## 2017-12-19 DIAGNOSIS — E782 Mixed hyperlipidemia: Secondary | ICD-10-CM | POA: Diagnosis not present

## 2017-12-19 DIAGNOSIS — E119 Type 2 diabetes mellitus without complications: Secondary | ICD-10-CM | POA: Diagnosis not present

## 2017-12-19 LAB — BASIC METABOLIC PANEL
BUN: 13 (ref 4–21)
Creatinine: 0.9 (ref 0.6–1.3)
GLUCOSE: 68
Glucose: 68
POTASSIUM: 4.4 (ref 3.4–5.3)
Sodium: 141 (ref 137–147)

## 2017-12-19 LAB — HEPATIC FUNCTION PANEL
ALT: 17 (ref 10–40)
AST: 17 (ref 14–40)
Alkaline Phosphatase: 49 (ref 25–125)
Bilirubin, Total: 0.3

## 2017-12-19 LAB — LIPID PANEL
Cholesterol: 137 (ref 0–200)
HDL: 39 (ref 35–70)
LDL Cholesterol: 90
Triglycerides: 49 (ref 40–160)

## 2017-12-19 LAB — HEMOGLOBIN A1C: Hemoglobin A1C: 7.8

## 2017-12-19 NOTE — Progress Notes (Signed)
Subjective: 67 y.o. year old male patient presents complaining of painful nails.  Patient requests toe nails trimmed.  Patient is diabetic and his blood sugar was 102.  Objective: Dermatologic: Thick yellow deformed nails  X 10. Vascular: Pedal pulses are all palpable. Orthopedic: No gross deformities. Neurologic: All epicritic and tactile sensations grossly intact.  Assessment: Dystrophic mycotic nails x 10. Painful toes.  Treatment: All mycotic nails debrided.  Return in 3 months or as needed.

## 2017-12-19 NOTE — Patient Instructions (Signed)
Seen for hypertrophic nails. All nails debrided. Return in 3 months or as needed.  

## 2017-12-20 ENCOUNTER — Encounter: Payer: Self-pay | Admitting: Podiatry

## 2017-12-22 DIAGNOSIS — E669 Obesity, unspecified: Secondary | ICD-10-CM | POA: Diagnosis not present

## 2017-12-22 DIAGNOSIS — E230 Hypopituitarism: Secondary | ICD-10-CM | POA: Diagnosis not present

## 2017-12-22 DIAGNOSIS — E119 Type 2 diabetes mellitus without complications: Secondary | ICD-10-CM | POA: Diagnosis not present

## 2017-12-22 DIAGNOSIS — E559 Vitamin D deficiency, unspecified: Secondary | ICD-10-CM | POA: Diagnosis not present

## 2017-12-22 DIAGNOSIS — I10 Essential (primary) hypertension: Secondary | ICD-10-CM | POA: Diagnosis not present

## 2017-12-22 DIAGNOSIS — E782 Mixed hyperlipidemia: Secondary | ICD-10-CM | POA: Diagnosis not present

## 2017-12-22 DIAGNOSIS — Z6839 Body mass index (BMI) 39.0-39.9, adult: Secondary | ICD-10-CM | POA: Diagnosis not present

## 2017-12-23 ENCOUNTER — Ambulatory Visit (INDEPENDENT_AMBULATORY_CARE_PROVIDER_SITE_OTHER): Payer: Medicare Other | Admitting: Family Medicine

## 2017-12-23 ENCOUNTER — Encounter: Payer: Self-pay | Admitting: Family Medicine

## 2017-12-23 VITALS — BP 128/72 | HR 69 | Ht 70.0 in | Wt 286.0 lb

## 2017-12-23 DIAGNOSIS — E1159 Type 2 diabetes mellitus with other circulatory complications: Secondary | ICD-10-CM

## 2017-12-23 DIAGNOSIS — E118 Type 2 diabetes mellitus with unspecified complications: Secondary | ICD-10-CM | POA: Diagnosis not present

## 2017-12-23 DIAGNOSIS — E230 Hypopituitarism: Secondary | ICD-10-CM | POA: Diagnosis not present

## 2017-12-23 DIAGNOSIS — I152 Hypertension secondary to endocrine disorders: Secondary | ICD-10-CM

## 2017-12-23 DIAGNOSIS — I10 Essential (primary) hypertension: Secondary | ICD-10-CM

## 2017-12-23 DIAGNOSIS — E782 Mixed hyperlipidemia: Secondary | ICD-10-CM

## 2017-12-23 DIAGNOSIS — Z794 Long term (current) use of insulin: Secondary | ICD-10-CM | POA: Diagnosis not present

## 2017-12-23 DIAGNOSIS — G4733 Obstructive sleep apnea (adult) (pediatric): Secondary | ICD-10-CM | POA: Diagnosis not present

## 2017-12-23 NOTE — Patient Instructions (Signed)
Thank you for coming in today.    

## 2017-12-23 NOTE — Progress Notes (Signed)
Gregory Governor Rooks [name] is a 67 y.o. male who presents to [name] today for follow-up hypertension hyperlipidemia diabetes and fatigue.  Dmonte's diabetes is co-managed with endocrinology.  He was seen earlier this week and had an A1c at 7.8.  He has reduced his carbohydrate intake and notes that his blood sugar is much better controlled.  He feels well with no polyuria or polydipsia.  Hypertension well managed with blood pressure medication as below.  Recent labs at [facility] with stable creatinine.  Labs will be abstracted.  Hyperlipidemia: Tolerating Crestor 5 mg.  Recent labs at [facility] showed LDL less than 100 but greater than 70.  Patient is on maximum tolerated statin and doing well without significant muscle aches or pains.  Patient notes continued daytime sleepiness.  He currently using CPAP but notes still fatigue.  He has an appointment in the near future's with his sleep medicine provider.    ROS as above:  Exam:  BP 128/72   Pulse 69   Ht 5\' 10"  (1.778 m)   Wt 286 lb (129.7 kg)   BMI 41.04 kg/m   Wt Readings from Last 5 Encounters:  12/23/17 286 lb (129.7 kg)  12/08/17 286 lb (129.7 kg)  11/04/17 283 lb (128.4 kg)  10/31/17 285 lb 1.9 oz (129.3 kg)  10/25/17 283 lb (128.4 kg)    Gen: Well NAD obese HEENT: EOMI,  MMM Lungs: Normal work of breathing. CTABL Heart: RRR no MRG Abd: NABS, Soft. Nondistended, Nontender Exts: Brisk capillary refill, warm and well perfused.   Lab and Radiology Results Labs reviewed from [facility] will be abstracted.   Assessment and Plan: 67 y.o. male with  Diabetes: Doing well.  Continue current regimen low carbohydrate diet.  Hyperlipidemia: Doing well continue Crestor.  Current maximum tolerated dose.  Hypertension: Doing well on current regimen.  Labs will be abstracted.  Excessive sleepiness.  Agree with  CPAP adjustment with sleep medicine doctor.  Check 6 months Medicare wellness exam.  Return sooner if needed.  Orders Placed This Encounter  Procedures  . Basic metabolic panel    This external order was created through the Results Console.  . Hemoglobin A1c    This external order was created through the Results Console.   No orders of the defined types were placed in this encounter.    Historical information moved to improve visibility of documentation.  Past Medical History:  Diagnosis Date  . Arthritis   . Chest pain    a. Normal cors 2001. b. Neg stress test 2012, 05/2014.  . Diabetes mellitus without complication (HCC)   . Hyperlipidemia   . Hypertensive response to exercise   . Obesity   . Obstructive sleep apnea    Currently untreated    Past Surgical History:  Procedure Laterality Date  . CERVICAL FUSION    . HERNIA REPAIR    . KNEE ARTHROSCOPY  1984   both knee   Social History   Tobacco Use  . Smoking status: Former Smoker    Packs/day: 1.50    Years: 30.00    Pack years: 45.00    Types: Cigarettes    Last attempt to quit: 1990    Years since quitting: 29.5  . Smokeless tobacco: Never Used  Substance Use Topics  . Alcohol use: No   family history includes Cancer in his father; Diabetes in his maternal grandfather, maternal grandmother, mother, and sister; Heart attack in  his maternal grandfather, maternal grandmother, mother, paternal grandfather, and paternal grandmother; Heart disease in his father and mother; Hyperlipidemia in his sister; Hypertension in his maternal grandfather, mother, sister, and sister; Stroke in his maternal grandmother and mother.  Medications: Current Outpatient Medications  Medication Sig Dispense Refill  . aspirin 81 MG tablet Take 81 mg by mouth daily.    . Cholecalciferol (VITAMIN D3) 5000 units CAPS Take 1 capsule by mouth daily.    . Empagliflozin-Linagliptin 25-5 MG TABS Take 1 tablet by mouth daily. 30 tablet 11  .  insulin glargine (LANTUS) 100 UNIT/ML injection Inject 40 Units into the skin 2 (two) times daily.    Marland Kitchen. losartan (COZAAR) 25 MG tablet Take 1 tablet (25 mg total) by mouth daily. 90 tablet 1  . metFORMIN (GLUCOPHAGE XR) 750 MG 24 hr tablet Take 1 tablet (750 mg total) by mouth 2 (two) times daily. 180 tablet 3  . omeprazole (PRILOSEC) 40 MG capsule Take 1 capsule (40 mg total) by mouth daily. 90 capsule 1  . ONETOUCH VERIO test strip 1 each by Other route as needed.     . pioglitazone (ACTOS) 15 MG tablet Take 15 mg by mouth daily.     . propranolol ER (INDERAL LA) 60 MG 24 hr capsule Take 1 capsule (60 mg total) by mouth daily. 90 capsule 1  . rosuvastatin (CRESTOR) 5 MG tablet Take 1 tablet by mouth daily.    . tamsulosin (FLOMAX) 0.4 MG CAPS capsule Take 1 tablet once a day for prostate    . traZODone (DESYREL) 50 MG tablet TAKE 1/2 TO 1 TABLET BY MOUTH AT BEDTIME AS NEEDED FOR SLEEP 90 tablet 3   No current facility-administered medications for this visit.    Allergies  Allergen Reactions  . Statins     Muscle cramping all over body. Lipitor, Crestor, and pravastatin.  Verdis Prime. Victoza [Liraglutide] Other (See Comments)    Caused pancreatitis     Discussed warning signs or symptoms. Please see discharge instructions. Patient expresses understanding.

## 2018-01-04 ENCOUNTER — Encounter: Payer: Self-pay | Admitting: Family Medicine

## 2018-01-04 LAB — URINE MICROSCOPIC-ADD ON: MICROALB UR: NEGATIVE

## 2018-01-04 LAB — CO2, TOTAL: CO2: 31

## 2018-01-12 ENCOUNTER — Ambulatory Visit (INDEPENDENT_AMBULATORY_CARE_PROVIDER_SITE_OTHER): Payer: Medicare Other | Admitting: Family Medicine

## 2018-01-12 ENCOUNTER — Encounter: Payer: Self-pay | Admitting: Family Medicine

## 2018-01-12 ENCOUNTER — Other Ambulatory Visit: Payer: Self-pay | Admitting: Family Medicine

## 2018-01-12 VITALS — BP 143/67 | HR 74 | Ht 70.0 in | Wt 288.0 lb

## 2018-01-12 DIAGNOSIS — R220 Localized swelling, mass and lump, head: Secondary | ICD-10-CM | POA: Diagnosis not present

## 2018-01-12 DIAGNOSIS — Z1159 Encounter for screening for other viral diseases: Secondary | ICD-10-CM | POA: Diagnosis not present

## 2018-01-12 DIAGNOSIS — Z23 Encounter for immunization: Secondary | ICD-10-CM

## 2018-01-12 DIAGNOSIS — K111 Hypertrophy of salivary gland: Secondary | ICD-10-CM

## 2018-01-12 NOTE — Progress Notes (Signed)
Gregory West [name] is a 67 y.o. male who [facility] today for jaw swelling.  Patient notes that his angle of his jaws have become more prominent over the last several months.  He denies any pain but notes that he has swelling at the angle of the jaw worse in the left than the right.  He denies any pain fevers chills nausea vomiting or diarrhea.  He thinks he had mumps as a child.  He denies any trouble swallowing and notes normal saliva.  He denies any trouble breathing.  Treatment tried yet.  No fevers chills or Weight loss.   ROS as above:  Exam:  BP (!) 143/67   Pulse 74   Ht 5\' 10"  (1.778 m)   Wt 288 lb (130.6 kg)   BMI 41.32 kg/m  Gen: Well NAD HEENT: EOMI,  MMM mild diffuse swelling bilateral angles of the jaws.  Swelling is in the area of the parotid gland and nontender with no nodules.  No cervical lymphadenopathy present.  Lungs: Normal work of breathing. CTABL Heart: RRR no MRG Abd: NABS, Soft. Nondistended, Nontender Exts: Brisk capillary refill, warm and well perfused.   Lab and Radiology Results No results found for this or any previous visit (from the past 72 hour(s)). No results found.    Assessment and Plan: 67 y.o. male with jaw swelling likely parotid glands however exact cause is somewhat unclear.  I am not sure if it was seen here is just a normal change or representing something more dangerous.  Exam today is reassuring.  Plan for labs listed below including lipase and amylase CMP CBC sed rate mumps antibody panel.  Additionally will proceed with soft tissue ultrasound.  As we are obtaining labs we will also check hepatitis C antibody as routine screening. Flu vaccine given today prior to discharge.   Orders Placed This Encounter  Procedures  . US Soft Tissue Head/Neck    Standing Status:   Future    Standing  Expiration Date:   03/15/2019    Order Specific Question:   Reason for Exam (SYMPTOM  OR DIAGNOSIS REQUIRED)    Answer:   Jaw swelling BL L>R    Order Specific Question:   Preferred imaging location?    Answer[facility]  . Flu Vaccine QUAD 36+ mos IM    [place]  . Lipase  . Amylase  . COMPLETE METABOLIC PANEL WITH GFR  . CBC with Differential/Platelet  . Sedimentation rate  . Mumps antibody, IgG  . Mumps Antibody, IgM  . Hepatitis C antibody   No orders of the defined types were placed in this encounter.    Historical information moved to improve visibility of documentation.  Past Medical History:  Diagnosis Date  . Arthritis   . Chest pain    a. Normal cors 2001. b. Neg stress test 2012, 05/2014.  . Diabetes mellitus without complication (HCC)   . Hyperlipidemia   . Hypertensive response to exercise   . Obesity   . Obstructive sleep apnea    Currently untreated    Past Surgical History:  Procedure Laterality Date  . CERVICAL FUSION    . HERNIA REPAIR    . KNEE ARTHROSCOPY  1984   both knee   Social History   Tobacco Use  . Smoking status: Former Smoker    Packs/day: 1.50    Years: 30.00    Pack  years: 45.00    Types: Cigarettes    Last attempt to quit: 1990    Years since quitting: 29.6  . Smokeless tobacco: Never Used  Substance Use Topics  . Alcohol use: No   family history includes Cancer in his father; Diabetes in his maternal grandfather, maternal grandmother, mother, and sister; Heart attack in his maternal grandfather, maternal grandmother, mother, paternal grandfather, and paternal grandmother; Heart disease in his father and mother; Hyperlipidemia in his sister; Hypertension in his maternal grandfather, mother, sister, and sister; Stroke in his maternal grandmother and mother.  Medications: Current Outpatient Medications  Medication Sig Dispense Refill  . aspirin 81 MG tablet Take 81 mg by mouth daily.    . Cholecalciferol  (VITAMIN D3) 5000 units CAPS Take 1 capsule by mouth daily.    . Empagliflozin-Linagliptin 25-5 MG TABS Take 1 tablet by mouth daily. 30 tablet 11  . insulin glargine (LANTUS) 100 UNIT/ML injection Inject 40 Units into the skin 2 (two) times daily.    Marland Kitchen. losartan (COZAAR) 25 MG tablet Take 1 tablet (25 mg total) by mouth daily. 90 tablet 1  . metFORMIN (GLUCOPHAGE XR) 750 MG 24 hr tablet Take 1 tablet (750 mg total) by mouth 2 (two) times daily. 180 tablet 3  . omeprazole (PRILOSEC) 40 MG capsule Take 1 capsule (40 mg total) by mouth daily. 90 capsule 1  . ONETOUCH VERIO test strip 1 each by Other route as needed.     . pioglitazone (ACTOS) 15 MG tablet Take 15 mg by mouth daily.     . propranolol ER (INDERAL LA) 60 MG 24 hr capsule Take 1 capsule (60 mg total) by mouth daily. 90 capsule 1  . rosuvastatin (CRESTOR) 5 MG tablet Take 1 tablet by mouth daily.    . tamsulosin (FLOMAX) 0.4 MG CAPS capsule Take 1 tablet once a day for prostate    . traZODone (DESYREL) 50 MG tablet TAKE 1/2 TO 1 TABLET BY MOUTH AT BEDTIME AS NEEDED FOR SLEEP 90 tablet 3   No current facility-administered medications for this visit.    Allergies  Allergen Reactions  . Statins     Muscle cramping all over body. Lipitor, Crestor, and pravastatin.  Verdis Prime. Victoza [Liraglutide] Other (See Comments)    Caused pancreatitis     Discussed warning signs or symptoms. Please see discharge instructions. Patient expresses understanding.

## 2018-01-12 NOTE — Patient Instructions (Addendum)
Thank you for coming in today. Get blood work today.  Schedule ultrasound.,  Recheck as scheduled.  Return sooner if needed.  Next step depending on what we find may be CT scan.  I think we are seeing is normal for you.

## 2018-01-14 LAB — CBC WITH DIFFERENTIAL/PLATELET
BASOS PCT: 0.3 %
Basophils Absolute: 11 cells/uL (ref 0–200)
Eosinophils Absolute: 100 cells/uL (ref 15–500)
Eosinophils Relative: 2.7 %
HEMATOCRIT: 41.3 % (ref 38.5–50.0)
HEMOGLOBIN: 13.6 g/dL (ref 13.2–17.1)
LYMPHS ABS: 1809 {cells}/uL (ref 850–3900)
MCH: 29.5 pg (ref 27.0–33.0)
MCHC: 32.9 g/dL (ref 32.0–36.0)
MCV: 89.6 fL (ref 80.0–100.0)
MPV: 10 fL (ref 7.5–12.5)
Monocytes Relative: 9.6 %
NEUTROS ABS: 1425 {cells}/uL — AB (ref 1500–7800)
Neutrophils Relative %: 38.5 %
PLATELETS: 157 10*3/uL (ref 140–400)
RBC: 4.61 10*6/uL (ref 4.20–5.80)
RDW: 12.8 % (ref 11.0–15.0)
Total Lymphocyte: 48.9 %
WBC: 3.7 10*3/uL — ABNORMAL LOW (ref 3.8–10.8)
WBCMIX: 355 {cells}/uL (ref 200–950)

## 2018-01-14 LAB — COMPLETE METABOLIC PANEL WITH GFR
AG Ratio: 1.2 (calc) (ref 1.0–2.5)
ALKALINE PHOSPHATASE (APISO): 69 U/L (ref 40–115)
ALT: 18 U/L (ref 9–46)
AST: 13 U/L (ref 10–35)
Albumin: 3.9 g/dL (ref 3.6–5.1)
BUN: 10 mg/dL (ref 7–25)
CALCIUM: 8.8 mg/dL (ref 8.6–10.3)
CO2: 29 mmol/L (ref 20–32)
CREATININE: 0.79 mg/dL (ref 0.70–1.25)
Chloride: 104 mmol/L (ref 98–110)
GFR, Est African American: 108 mL/min/{1.73_m2} (ref >=60)
GFR, Est Non African American: 94 mL/min/{1.73_m2} (ref >=60)
GLUCOSE: 171 mg/dL — AB (ref 65–99)
Globulin: 3.2 g/dL (calc) (ref 1.9–3.7)
Potassium: 4.3 mmol/L (ref 3.5–5.3)
Sodium: 140 mmol/L (ref 135–146)
Total Bilirubin: 0.3 mg/dL (ref 0.2–1.2)
Total Protein: 7.1 g/dL (ref 6.1–8.1)

## 2018-01-14 LAB — SEDIMENTATION RATE: Sed Rate: 9 mm/h (ref 0–20)

## 2018-01-14 LAB — MUMPS ANTIBODY, IGM: Mumps IgM Value: <1:20 {titer}

## 2018-01-14 LAB — HEPATITIS C ANTIBODY
HEP C AB: NONREACTIVE
SIGNAL TO CUT-OFF: 0.04 (ref <1.00)

## 2018-01-14 LAB — LIPASE: LIPASE: 10 U/L (ref 7–60)

## 2018-01-14 LAB — MUMPS ANTIBODY, IGG: Mumps IgG: 271 AU/mL

## 2018-01-14 LAB — AMYLASE: Amylase: 26 U/L (ref 21–101)

## 2018-01-16 ENCOUNTER — Ambulatory Visit (INDEPENDENT_AMBULATORY_CARE_PROVIDER_SITE_OTHER): Payer: Medicare Other

## 2018-01-16 DIAGNOSIS — R220 Localized swelling, mass and lump, head: Secondary | ICD-10-CM | POA: Diagnosis not present

## 2018-01-16 DIAGNOSIS — K111 Hypertrophy of salivary gland: Secondary | ICD-10-CM | POA: Diagnosis not present

## 2018-02-02 ENCOUNTER — Encounter: Payer: Self-pay | Admitting: Family Medicine

## 2018-02-13 DIAGNOSIS — Z136 Encounter for screening for cardiovascular disorders: Secondary | ICD-10-CM | POA: Diagnosis not present

## 2018-02-13 DIAGNOSIS — I714 Abdominal aortic aneurysm, without rupture: Secondary | ICD-10-CM | POA: Diagnosis not present

## 2018-02-13 DIAGNOSIS — Z87891 Personal history of nicotine dependence: Secondary | ICD-10-CM | POA: Diagnosis not present

## 2018-03-20 ENCOUNTER — Ambulatory Visit (INDEPENDENT_AMBULATORY_CARE_PROVIDER_SITE_OTHER): Payer: Medicare Other | Admitting: Podiatry

## 2018-03-20 DIAGNOSIS — E0800 Diabetes mellitus due to underlying condition with hyperosmolarity without nonketotic hyperglycemic-hyperosmolar coma (NKHHC): Secondary | ICD-10-CM

## 2018-03-20 DIAGNOSIS — E559 Vitamin D deficiency, unspecified: Secondary | ICD-10-CM | POA: Diagnosis not present

## 2018-03-20 DIAGNOSIS — B351 Tinea unguium: Secondary | ICD-10-CM

## 2018-03-20 DIAGNOSIS — E119 Type 2 diabetes mellitus without complications: Secondary | ICD-10-CM | POA: Diagnosis not present

## 2018-03-20 DIAGNOSIS — E782 Mixed hyperlipidemia: Secondary | ICD-10-CM | POA: Diagnosis not present

## 2018-03-21 ENCOUNTER — Telehealth: Payer: Self-pay | Admitting: Family Medicine

## 2018-03-21 ENCOUNTER — Encounter: Payer: Self-pay | Admitting: Podiatry

## 2018-03-21 NOTE — Patient Instructions (Signed)
Seen for hypertrophic nails. All nails debrided. Instructed to seek another podiatrist for further podiatric care.

## 2018-03-21 NOTE — Telephone Encounter (Signed)
I received a letter from [facility] saying that you have not been refilling the metformin.  Sometimes I get these letters because people just have too much of it and do not need a refill.  Please let me know if you have stopped taking it.

## 2018-03-21 NOTE — Progress Notes (Signed)
Subjective: 67 y.o. year old male patient presents complaining of painful nails and request for trimming.  Blood sugar is under control.  Objective: Dermatologic: Thick yellow deformed nails  X 10. Vascular: Pedal pulses are all palpable. Orthopedic: No gross deformities. Neurologic: All epicritic and tactile sensations grossly intact.  Assessment: Dystrophic mycotic nails x 10. Painful toes.  Treatment: All mycotic nails debrided.

## 2018-03-21 NOTE — Telephone Encounter (Signed)
Spoke to pt, still taking medication, he just had extra.  No needs at this time

## 2018-03-23 DIAGNOSIS — E119 Type 2 diabetes mellitus without complications: Secondary | ICD-10-CM | POA: Diagnosis not present

## 2018-03-23 DIAGNOSIS — I10 Essential (primary) hypertension: Secondary | ICD-10-CM | POA: Diagnosis not present

## 2018-03-23 DIAGNOSIS — E230 Hypopituitarism: Secondary | ICD-10-CM | POA: Diagnosis not present

## 2018-03-23 DIAGNOSIS — E559 Vitamin D deficiency, unspecified: Secondary | ICD-10-CM | POA: Diagnosis not present

## 2018-03-23 DIAGNOSIS — E782 Mixed hyperlipidemia: Secondary | ICD-10-CM | POA: Diagnosis not present

## 2018-03-28 DIAGNOSIS — G4733 Obstructive sleep apnea (adult) (pediatric): Secondary | ICD-10-CM | POA: Diagnosis not present

## 2018-04-13 ENCOUNTER — Other Ambulatory Visit: Payer: Self-pay | Admitting: Family Medicine

## 2018-06-08 ENCOUNTER — Encounter: Payer: Self-pay | Admitting: Interventional Cardiology

## 2018-06-08 ENCOUNTER — Ambulatory Visit (INDEPENDENT_AMBULATORY_CARE_PROVIDER_SITE_OTHER): Payer: Medicare Other | Admitting: Interventional Cardiology

## 2018-06-08 VITALS — BP 146/82 | HR 82 | Ht 70.0 in | Wt 289.4 lb

## 2018-06-08 DIAGNOSIS — G4733 Obstructive sleep apnea (adult) (pediatric): Secondary | ICD-10-CM

## 2018-06-08 DIAGNOSIS — F411 Generalized anxiety disorder: Secondary | ICD-10-CM

## 2018-06-08 DIAGNOSIS — I10 Essential (primary) hypertension: Secondary | ICD-10-CM | POA: Diagnosis not present

## 2018-06-08 DIAGNOSIS — R079 Chest pain, unspecified: Secondary | ICD-10-CM | POA: Diagnosis not present

## 2018-06-08 DIAGNOSIS — R072 Precordial pain: Secondary | ICD-10-CM

## 2018-06-08 DIAGNOSIS — E1159 Type 2 diabetes mellitus with other circulatory complications: Secondary | ICD-10-CM | POA: Diagnosis not present

## 2018-06-08 DIAGNOSIS — E782 Mixed hyperlipidemia: Secondary | ICD-10-CM

## 2018-06-08 DIAGNOSIS — E118 Type 2 diabetes mellitus with unspecified complications: Secondary | ICD-10-CM | POA: Diagnosis not present

## 2018-06-08 DIAGNOSIS — I152 Hypertension secondary to endocrine disorders: Secondary | ICD-10-CM

## 2018-06-08 NOTE — Progress Notes (Signed)
Cardiology Office Note:    Date:  06/08/2018   ID:  Gregory West., DOB [birth year], MRN [id number]  PCP:  Gregory Bongorey, Evan S, MD  Cardiologist:  No primary care provider on file.   Referring MD: Gregory Bongorey, Evan S, MD   Chief Complaint  Patient presents with  . Coronary Artery Disease  . Chest Pain  . Hypertension    History of Present Illness:    Gregory West. is a 68 y.o. male with a hx of DM, OSA, HLD, obesity, prior hypertensive response to exercise, and chest pain with prior negative cardiac workup who presents for f/u due to concerns about left parasternal chest pain.  [name] has a 2-week history of near continuous left parasternal discomfort that he says is associated with shortness of breath on exertion.  The intensity of the discomfort does not change with physical activity.  The concerning discomfort is present during the exam today and was present at the time of EKG performance.  He denies exertion related increase in chest discomfort or any other type discomfort with activity.  Prior cardiac evaluation has included a low risk myocardial perfusion study 2017, negative exercise treadmill test 2016, abnormal myocardial perfusion study 2001, and coronary angiography in 2001.  Evaluations have been for atypical chest pain.  He is not wearing CPAP.  He has a diagnosis of obstructive sleep apnea.  He denies lower extremity pain and discomfort with walking.  No peripheral edema is been noted.  He advocates compliance with his current medical regimen.  He is diabetic and is on an SGLT2/Crestor/Cozaar/and aspirin.  He does not smoke.  Father died of myocardial infarction at age 67.  Stop smoking cigarettes 1995.  Has been diabetic for than 20 years.  Past Medical History:  Diagnosis Date  . Arthritis   . Chest pain    a. Normal cors 2001. b. Neg stress test 2012, 05/2014.  . Diabetes mellitus without complication (HCC)   . Hyperlipidemia   . Hypertensive response  to exercise   . Obesity   . Obstructive sleep apnea    Currently untreated     Past Surgical History:  Procedure Laterality Date  . CERVICAL FUSION    . HERNIA REPAIR    . KNEE ARTHROSCOPY  1984   both knee    Current Medications: Current Meds  Medication Sig  . aspirin 81 MG tablet Take 81 mg by mouth daily.  . Cholecalciferol (VITAMIN D3) 5000 units CAPS Take 1 capsule by mouth daily.  . empagliflozin (JARDIANCE) 25 MG TABS tablet Take 25 mg by mouth daily.  . Empagliflozin-Linagliptin 25-5 MG TABS Take 1 tablet by mouth daily.  . insulin glargine (LANTUS) 100 UNIT/ML injection Inject 40 Units into the skin 2 (two) times daily.  Marland Kitchen. losartan (COZAAR) 25 MG tablet Take 1 tablet (25 mg total) by mouth daily.  . metFORMIN (GLUCOPHAGE XR) 750 MG 24 hr tablet Take 1 tablet (750 mg total) by mouth 2 (two) times daily.  Marland Kitchen. omeprazole (PRILOSEC) 40 MG capsule Take 1 capsule (40 mg total) by mouth daily.  [name] ONETOUCH VERIO test strip 1 each by Other route as needed.   . pioglitazone (ACTOS) 15 MG tablet Take 15 mg by mouth daily.   . propranolol ER (INDERAL LA) 60 MG 24 hr capsule TAKE 1 CAPSULE(60 MG) BY MOUTH DAILY  . rosuvastatin (CRESTOR) 5 MG tablet Take 1 tablet by mouth daily.  . tamsulosin (FLOMAX) 0.4 MG CAPS capsule Take 1 tablet  once a day for prostate  . traZODone (DESYREL) 50 MG tablet TAKE 1/2 TO 1 TABLET BY MOUTH AT BEDTIME AS NEEDED FOR SLEEP     Allergies:   Statins and Victoza [liraglutide]   Social History   Socioeconomic History  . Marital status: Married    Spouse name: Not on file  . Number of children: Not on file  . Years of education: Not on file  . Highest education level: Not on file  Occupational History  . Not on file  Social Needs  . Financial resource strain: Not on file  . Food insecurity:    Worry: Not on file    Inability: Not on file  . Transportation needs:    Medical: Not on file    Non-medical: Not on file  Tobacco Use  . Smoking  status: Former Smoker    Packs/day: 1.50    Years: 30.00    Pack years: 45.00    Types: Cigarettes    Last attempt to quit: 1990    Years since quitting: 30.0  . Smokeless tobacco: Never Used  Substance and Sexual Activity  . Alcohol use: No  . Drug use: No  . Sexual activity: Not on file  Lifestyle  . Physical activity:    Days per week: Not on file    Minutes per session: Not on file  . Stress: Not on file  Relationships  . Social connections:    Talks on phone: Not on file    Gets together: Not on file    Attends religious service: Not on file    Active member of club or organization: Not on file    Attends meetings of clubs or organizations: Not on file    Relationship status: Not on file  Other Topics Concern  . Not on file  Social History Narrative  . Not on file     Family History: The patient'West family history includes Cancer in his father; Diabetes in his maternal grandfather, maternal grandmother, mother, and sister; Heart attack in his maternal grandfather, maternal grandmother, mother, paternal grandfather, and paternal grandmother; Heart disease in his father and mother; Hyperlipidemia in his sister; Hypertension in his maternal grandfather, mother, sister, and sister; Stroke in his maternal grandmother and mother.  ROS:   Please see the history of present illness.    Guarded with vision, cough, shortness of breath with activity, abdominal pain, diarrhea, back pain, muscle pain, dizziness, waking up at night short of breath, chills, fatigue, fever, leg pain, snoring, constipation, anxiety, difficulty with balance and headaches.  Recurring nosebleeds.  All other systems reviewed and are negative.  EKGs/Labs/Other Studies Reviewed:    The following studies were reviewed today: CT scan of the chest 2019, June: IMPRESSION: 1. Today'West study does demonstrate some subtle fibrotic changes in the lungs, which are nonspecific. CT pattern is considered indeterminate for  usual interstitial pneumonia (UIP). Repeat high-resolution chest CT is recommended in 12 months to assess for temporal changes in the appearance of the lung parenchyma. 2. Aortic atherosclerosis. 3. There are calcifications of the aortic valve. Echocardiographic correlation for evaluation of potential valvular dysfunction may be warranted if clinically indicated.  Aortic Atherosclerosis (ICD10-I70.0).  EKG:  EKG is performed today during chest pain and is entirely normal.  Heart rate 82 bpm sinus rhythm, no ST-T wave change or prior evidence of infarction.  Recent Labs: 01/12/2018: ALT 18; BUN 10; Creat 0.79; Hemoglobin 13.6; Platelets 157; Potassium 4.3; Sodium 140  Recent Lipid Panel  Component Value Date/Time   CHOL 137 12/19/2017   TRIG 49 12/19/2017   HDL 39 12/19/2017   VLDL 22 04/01/2015   LDLCALC 90 12/19/2017    Physical Exam:    VS:  BP (!) 146/82   Pulse 82   Ht 5\' 10"  (1.778 m)   Wt 289 lb 6.4 oz (131.3 kg)   SpO2 96%   BMI 41.52 kg/m     Wt Readings from Last 3 Encounters:  06/08/18 289 lb 6.4 oz (131.3 kg)  01/12/18 288 lb (130.6 kg)  12/23/17 286 lb (129.7 kg)     GEN: Obese. No acute distress HEENT: Normal NECK: No JVD. LYMPHATICS: No lymphadenopathy CARDIAC: No palpable chest wall tenderness.  RRR.  1/6 to 2/6 systolic right upper sternal border murmur.  No gallop.  Trace edema VASCULAR: Pulses are 1-2+ bilateral radial., Bruits are absent in the carotid region RESPIRATORY:  Clear to auscultation without rales, wheezing or rhonchi  ABDOMEN: Soft, non-tender, non-distended, No pulsatile mass, MUSCULOSKELETAL: No deformity  SKIN: Warm and dry NEUROLOGIC:  Alert and oriented x 3 PSYCHIATRIC:  Normal affect   ASSESSMENT:    1. Chest pain, unspecified type   2. Mixed hyperlipidemia   3. Hypertension associated with diabetes (HCC)   4. Type II diabetes mellitus with complication (HCC)   5. Obstructive sleep apnea   6. Anxiety state    PLAN:      In order of problems listed above:  1. Coronary CTA with morphology with FFR if indicated.  He has had multiple cardiac evaluations in the past without demonstration of ischemia or obstructive disease.  Last evaluation greater than 3 years ago.  He has multiple significant risk factors as noted above including longstanding diabetes, obesity, sleep apnea which is untreated, hyperlipidemia, hypertension, and family history.  Furthermore anxiety concerning and early demise related to ischemic heart disease requires that some testing be performed to further enlighten the patient with reference to etiology. 2. Target LDL less than 70 3. Target blood pressure less than 130/80 4. Hemoglobin A1c less than 7.  Most recent A1c in July was 7.8. 5. Encouraged compliance with CPAP.  Encourage re-evaluation by his sleep physician to see if newer equipment may be more comfortably worn to improve compliance.  Overall education and awareness concerning primary/secondary risk prevention was discussed in detail: LDL less than 70, hemoglobin A1c less than 7, blood pressure target less than 130/80 mmHg, >150 minutes of moderate aerobic activity per week, avoidance of smoking, weight control (via diet and exercise), and continued surveillance/management of/for obstructive sleep apnea.    Medication Adjustments/Labs and Tests Ordered: Current medicines are reviewed at length with the patient today.  Concerns regarding medicines are outlined above.  Orders Placed This Encounter  Procedures  . CT CORONARY MORPH W/CTA COR W/SCORE W/CA W/CM &/OR WO/CM  . CT CORONARY FRACTIONAL FLOW RESERVE DATA PREP  . CT CORONARY FRACTIONAL FLOW RESERVE FLUID ANALYSIS  . Basic metabolic panel  . EKG 12-Lead   No orders of the defined types were placed in this encounter.   Patient Instructions  Medication Instructions:  Your physician recommends that you continue on your current medications as directed. Please refer to the  Current Medication list given to you today.  If you need a refill on your cardiac medications before your next appointment, please call your pharmacy.   Lab work: You will need a BMET prior to CT  If you have labs (blood work) drawn today and your tests  are completely normal, you will receive your results only by: Marland Kitchen MyChart Message (if you have MyChart) OR . A paper copy in the mail If you have any lab test that is abnormal or we need to change your treatment, we will call you to review the results.  Testing/Procedures: Your physician has requested that you have cardiac CT. Cardiac computed tomography (CT) is a painless test that uses an x-ray machine to take clear, detailed pictures of your heart. For further information please visit [email or web address] Please follow instruction sheet as given.   Follow-Up: At  [facility], you and your health needs are our priority.  As part of our continuing mission to provide you with exceptional heart care, we have created designated Provider Care Teams.  These Care Teams include your primary Cardiologist (physician) and Advanced Practice Providers (APPs -  Physician Assistants and Nurse Practitioners) who all work together to provide you with the care you need, when you need it. You will need a follow up appointment in 9-12 months.  Please call our office 2 months in advance to schedule this appointment.  You may see Dr. Katrinka Blazing or one of the following Advanced Practice Providers on your designated Care Team:   Norma Fredrickson, NP Nada Boozer, NP . Georgie Chard, NP  Any Other Special Instructions Will Be Listed Below (If Applicable).  Please arrive at the [facility] main entrance of [facility] 30-45 minutes prior to test start time  [facility] [address] [place], [place] [address] [phone]  Proceed to the [facility] Radiology Department (First Floor).  Please follow these instructions carefully (unless  otherwise directed):  Hold all erectile dysfunction medications at least 48 hours prior to test.  On the Night Before the Test: . Be sure to Drink plenty of water. . Do not consume any caffeinated/decaffeinated beverages or chocolate 12 hours prior to your test. . Do not take any antihistamines 12 hours prior to your test. . If you take Metformin do not take 24 hours prior to test.   On the Day of the Test: . Drink plenty of water. Do not drink any water within one hour of the test. . Do not eat any food 4 hours prior to the test. . You may take your regular medications prior to the test.  . Take two Propranolol the morning of your test. . HOLD Furosemide/Hydrochlorothiazide morning of the test.       After the Test: . Drink plenty of water. . After receiving IV contrast, you may experience a mild flushed feeling. This is normal. . On occasion, you may experience a mild rash up to 24 hours after the test. This is not dangerous. If this occurs, you can take Benadryl 25 mg and increase your fluid intake. . If you experience trouble breathing, this can be serious. If it is severe call 911 IMMEDIATELY. If it is mild, please call our office. . If you take any of these medications: Glipizide/Metformin, Avandament, Glucavance, please do not take 48 hours after completing test.       Signed, Lesleigh Noe, MD  06/08/2018 5:29 PM    [facility]

## 2018-06-08 NOTE — Patient Instructions (Addendum)
Medication Instructions:  Your physician recommends that you continue on your current medications as directed. Please refer to the Current Medication list given to you today.  If you need a refill on your cardiac medications before your next appointment, please call your pharmacy.   Lab work: You will need a BMET prior to CT  If you have labs (blood work) drawn today and your tests are completely normal, you will receive your results only by: Marland Kitchen MyChart Message (if you have MyChart) OR . A paper copy in the mail If you have any lab test that is abnormal or we need to change your treatment, we will call you to review the results.  Testing/Procedures: Your physician has requested that you have cardiac CT. Cardiac computed tomography (CT) is a painless test that uses an x-ray machine to take clear, detailed pictures of your heart. For further information please visit [email or web address] Please follow instruction sheet as given.   Follow-Up: At [facility], you and your health needs are our priority.  As part of our continuing mission to provide you with exceptional heart care, we have created designated Provider Care Teams.  These Care Teams include your primary Cardiologist (physician) and Advanced Practice Providers (APPs -  Physician Assistants and Nurse Practitioners) who all work together to provide you with the care you need, when you need it. You will need a follow up appointment in 9-12 months.  Please call our office 2 months in advance to schedule this appointment.  You may see Dr. Katrinka Blazing or one of the following Advanced Practice Providers on your designated Care Team:   Norma Fredrickson, NP Nada Boozer, NP . Georgie Chard, NP  Any Other Special Instructions Will Be Listed Below (If Applicable).  Please arrive at the [facility] main entrance of [facility] 30-45 minutes prior to test start time  [facility] [address] [place], [place] [address] [phone]  Proceed to the [facility] Radiology Department (First Floor).  Please follow these instructions carefully (unless otherwise directed):  Hold all erectile dysfunction medications at least 48 hours prior to test.  On the Night Before the Test: . Be sure to Drink plenty of water. . Do not consume any caffeinated/decaffeinated beverages or chocolate 12 hours prior to your test. . Do not take any antihistamines 12 hours prior to your test. . If you take Metformin do not take 24 hours prior to test.   On the Day of the Test: . Drink plenty of water. Do not drink any water within one hour of the test. . Do not eat any food 4 hours prior to the test. . You may take your regular medications prior to the test.  . Take two Propranolol the morning of your test. . HOLD Furosemide/Hydrochlorothiazide morning of the test.       After the Test: . Drink plenty of water. . After receiving IV contrast, you may experience a mild flushed feeling. This is normal. . On occasion, you may experience a mild rash up to 24 hours after the test. This is not dangerous. If this occurs, you can take Benadryl 25 mg and increase your fluid intake. . If you experience trouble breathing, this can be serious. If it is severe call 911 IMMEDIATELY. If it is mild, please call our office. . If you take any of these medications: Glipizide/Metformin, Avandament, Glucavance, please do not take 48 hours after completing test.

## 2018-06-20 DIAGNOSIS — E119 Type 2 diabetes mellitus without complications: Secondary | ICD-10-CM | POA: Diagnosis not present

## 2018-06-20 DIAGNOSIS — E782 Mixed hyperlipidemia: Secondary | ICD-10-CM | POA: Diagnosis not present

## 2018-06-20 LAB — HEMOGLOBIN A1C: HEMOGLOBIN A1C: 8.5

## 2018-06-20 LAB — BASIC METABOLIC PANEL: Creatinine: 0.8 (ref 0.6–1.3)

## 2018-06-20 LAB — LIPID PANEL
Cholesterol: 180 (ref 0–200)
HDL: 37 (ref 35–70)
LDL Cholesterol: 98
Triglycerides: 158 (ref 40–160)

## 2018-06-22 ENCOUNTER — Other Ambulatory Visit: Payer: Self-pay | Admitting: Family Medicine

## 2018-06-22 DIAGNOSIS — E782 Mixed hyperlipidemia: Secondary | ICD-10-CM | POA: Diagnosis not present

## 2018-06-22 DIAGNOSIS — I10 Essential (primary) hypertension: Secondary | ICD-10-CM | POA: Diagnosis not present

## 2018-06-22 DIAGNOSIS — E1165 Type 2 diabetes mellitus with hyperglycemia: Secondary | ICD-10-CM | POA: Diagnosis not present

## 2018-06-22 DIAGNOSIS — E559 Vitamin D deficiency, unspecified: Secondary | ICD-10-CM | POA: Diagnosis not present

## 2018-06-22 DIAGNOSIS — Z794 Long term (current) use of insulin: Secondary | ICD-10-CM | POA: Diagnosis not present

## 2018-06-22 DIAGNOSIS — E230 Hypopituitarism: Secondary | ICD-10-CM | POA: Diagnosis not present

## 2018-06-22 NOTE — Addendum Note (Signed)
Addended by: Julio Sicks on: 06/22/2018 09:53 AM   Modules accepted: Orders

## 2018-06-26 ENCOUNTER — Other Ambulatory Visit: Payer: Medicare Other | Admitting: *Deleted

## 2018-06-26 DIAGNOSIS — R079 Chest pain, unspecified: Secondary | ICD-10-CM

## 2018-06-26 LAB — BASIC METABOLIC PANEL
BUN / CREAT RATIO: 10 (ref 10–24)
BUN: 9 mg/dL (ref 8–27)
CO2: 26 mmol/L (ref 20–29)
Calcium: 9.1 mg/dL (ref 8.6–10.2)
Chloride: 98 mmol/L (ref 96–106)
Creatinine, Ser: 0.9 mg/dL (ref 0.76–1.27)
GFR calc Af Amer: 103 mL/min/{1.73_m2} (ref >59)
GFR calc non Af Amer: 89 mL/min/{1.73_m2} (ref >59)
Glucose: 138 mg/dL — ABNORMAL HIGH (ref 65–99)
Potassium: 4.1 mmol/L (ref 3.5–5.2)
Sodium: 138 mmol/L (ref 134–144)

## 2018-06-27 ENCOUNTER — Telehealth: Payer: Self-pay | Admitting: Family Medicine

## 2018-06-27 NOTE — Telephone Encounter (Signed)
Received labs from endocrinology.  Results quick abstracted.  A1c 8.5.  Lipids abstracted with LDL less than 100.

## 2018-06-28 ENCOUNTER — Ambulatory Visit (INDEPENDENT_AMBULATORY_CARE_PROVIDER_SITE_OTHER): Payer: Medicare Other | Admitting: Family Medicine

## 2018-06-28 ENCOUNTER — Encounter: Payer: Medicare Other | Admitting: Family Medicine

## 2018-06-28 ENCOUNTER — Encounter: Payer: Self-pay | Admitting: Family Medicine

## 2018-06-28 VITALS — BP 133/77 | HR 79 | Ht 70.0 in | Wt 287.0 lb

## 2018-06-28 DIAGNOSIS — M25511 Pain in right shoulder: Secondary | ICD-10-CM | POA: Diagnosis not present

## 2018-06-28 DIAGNOSIS — M545 Low back pain, unspecified: Secondary | ICD-10-CM

## 2018-06-28 DIAGNOSIS — M25512 Pain in left shoulder: Secondary | ICD-10-CM

## 2018-06-28 DIAGNOSIS — Z0000 Encounter for general adult medical examination without abnormal findings: Secondary | ICD-10-CM

## 2018-06-28 DIAGNOSIS — M542 Cervicalgia: Secondary | ICD-10-CM | POA: Diagnosis not present

## 2018-06-28 DIAGNOSIS — E230 Hypopituitarism: Secondary | ICD-10-CM | POA: Diagnosis not present

## 2018-06-28 DIAGNOSIS — J849 Interstitial pulmonary disease, unspecified: Secondary | ICD-10-CM | POA: Diagnosis not present

## 2018-06-28 NOTE — Progress Notes (Signed)
HPI: Gregory WINKLE [name] is a 68 y.o. male  who presents to [name] today, 06/29/18,  for Medicare Annual Wellness Exam  Patient presents for annual physical/Medicare wellness exam.  Gregory West had a history of fall June 2018.  He had initial evaluation for the neck pain resulting from the fall with x-ray.  He notes although the pain has improved but he still has significant pain in his posterior cervical spine.  He notes pain at the lower aspect of his spine near his upper mid back and lower neck.  He notes the pain is been slowly worsening recently despite over-the-counter medication.  He is developed some slight pain into his thumbs bilaterally without injury to his hands.  He is not sure if that is related to his neck.  He has an extensive history of multiple level cervical fusion.  Additionally he has a history of sleep apnea.  This is currently managed by Dr. Earl Gala.  He has had multiple adjustments.  He currently is using BiPAP with several adjustments to his settings.  He notes that he has difficulty keeping the mask on at night.  He sometimes wakes up finds it obnoxious and wants of removing it in his sleep.  He feels significant fatigue and sleepiness during the day.  He will often fall asleep when left alone.  He is occasionally falling asleep at stop lights while driving.    Health maintenance/lifestyle: Patient is exercising somewhat.  About 1 day a week he is walking 10 to 20 minutes.  He is planning on starting a water aerobics class with his wife. He is not very careful with his diet however his wife has started a lower carbohydrate diet with careful protein counting.  He is thinking about joining her with a lower carbohydrate diet. He has a history of one fall in 2019 with injury.    Past medical, surgical, social and family history reviewed:  Patient Active Problem List   Diagnosis Date Noted  . Hypertension associated with  diabetes (HCC) 07/13/2017  . Benign essential tremor 07/13/2017  . Kidney cyst, acquired 01/24/2017  . Seborrheic keratoses 04/01/2016  . Diverticulosis of colon without hemorrhage 02/26/2016  . Anxiety state 10/02/2015  . BPH (benign prostatic hyperplasia) 07/31/2015  . 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 Vitamin D deficiency 03/06/2015  . History of pancreatitis 03/06/2015  . Chronic back pain 03/04/2015  . Type II diabetes mellitus with complication (HCC) 05/15/2014  . Obstructive sleep apnea 05/15/2014  . Hyperlipidemia 05/15/2014  . Glaucoma suspect 10/21/2011    Past Surgical History:  Procedure Laterality Date  . CERVICAL FUSION    . HERNIA REPAIR    . KNEE ARTHROSCOPY  1984   both knee    Social History   Socioeconomic History  . Marital status: Married    Spouse name: Not on file  . Number of children: Not on file  . Years of education: Not on file  . Highest education level: Not on file  Occupational History  . Not on file  Social Needs  . Financial resource strain: Not on file  . Food insecurity:    Worry: Not on file    Inability: Not on file  . Transportation needs:    Medical: Not on file    Non-medical: Not on file  Tobacco Use  . Smoking status: Former Smoker    Packs/day: 1.50    Years: 30.00    Pack years: 45.00  Types: Cigarettes    Last attempt to quit: 1990    Years since quitting: 30.0  . Smokeless tobacco: Never Used  Substance and Sexual Activity  . Alcohol use: No  . Drug use: No  . Sexual activity: Not on file  Lifestyle  . Physical activity:    Days per week: Not on file    Minutes per session: Not on file  . Stress: Not on file  Relationships  . Social connections:    Talks on phone: Not on file    Gets together: Not on file    Attends religious service: Not on file    Active member of club or organization: Not on file    Attends meetings of clubs or organizations: Not on file    Relationship status: Not  on file  . Intimate partner violence:    Fear of current or ex partner: Not on file    Emotionally abused: Not on file    Physically abused: Not on file    Forced sexual activity: Not on file  Other Topics Concern  . Not on file  Social History Narrative  . Not on file    Family History  Problem Relation Age of Onset  . Diabetes Mother   . Heart disease Mother   . Hypertension Mother   . Stroke Mother   . Heart attack Mother   . Cancer Father   . Heart disease Father   . Hyperlipidemia Sister   . Hypertension Sister   . Diabetes Maternal Grandmother   . Stroke Maternal Grandmother   . Heart attack Maternal Grandmother   . Diabetes Maternal Grandfather   . Hypertension Maternal Grandfather   . Heart attack Maternal Grandfather   . Diabetes Sister   . Hypertension Sister   . Heart attack Paternal Grandmother   . Heart attack Paternal Grandfather      Current medication list and allergy/intolerance information reviewed:    Outpatient Encounter Medications as of 06/28/2018  Medication Sig  . aspirin 81 MG tablet Take 81 mg by mouth daily.  . Cholecalciferol (VITAMIN D3) 5000 units CAPS Take 1 capsule by mouth daily.  . empagliflozin (JARDIANCE) 25 MG TABS tablet Take 25 mg by mouth daily.  . Empagliflozin-Linagliptin 25-5 MG TABS Take 1 tablet by mouth daily.  . insulin glargine (LANTUS) 100 UNIT/ML injection Inject 40 Units into the skin 2 (two) times daily.  Marland Kitchen. losartan (COZAAR) 25 MG tablet Take 1 tablet (25 mg total) by mouth daily.  . metFORMIN (GLUCOPHAGE XR) 750 MG 24 hr tablet Take 1 tablet (750 mg total) by mouth 2 (two) times daily.  Marland Kitchen. omeprazole (PRILOSEC) 40 MG capsule Take 1 capsule (40 mg total) by mouth daily.  [name] ONETOUCH VERIO test strip 1 each by Other route as needed.   . pioglitazone (ACTOS) 15 MG tablet Take 15 mg by mouth daily.   . propranolol ER (INDERAL LA) 60 MG 24 hr capsule TAKE 1 CAPSULE(60 MG) BY MOUTH DAILY  . rosuvastatin (CRESTOR) 5 MG  tablet Take 1 tablet by mouth daily.  . tamsulosin (FLOMAX) 0.4 MG CAPS capsule Take 1 tablet once a day for prostate  . traZODone (DESYREL) 50 MG tablet TAKE 1/2 TO 1 TABLET BY MOUTH AT BEDTIME AS NEEDED FOR SLEEP   No facility-administered encounter medications on file as of 06/28/2018.     Allergies  Allergen Reactions  . Statins     Muscle cramping all over body. Lipitor, Crestor, and pravastatin.  .Marland Kitchen  Victoza [Liraglutide] Other (See Comments)    Caused pancreatitis       Review of Systems: No headache, visual changes, nausea, vomiting, diarrhea, constipation, dizziness, abdominal pain, skin rash, fevers, chills, night sweats, weight loss, swollen lymph nodes, body aches, joint swelling, muscle aches, chest pain, shortness of breath, mood changes, visual or auditory hallucinations.     Medicare Wellness Questionnaire  Are there smokers in your home (other than you)? no  Depression screen [facility] 2/9 12/23/2017 07/12/2017 10/07/2016 12/31/2015  Decreased Interest 0 0 0 0  Down, Depressed, Hopeless 0 0 0 0  PHQ - 2 Score 0 0 0 0  Altered sleeping - - 0 2  Tired, decreased energy - - 0 1  Change in appetite - - 0 1  Feeling bad or failure about yourself  - - 0 0  Trouble concentrating - - 0 1  Moving slowly or fidgety/restless - - 0 0  Suicidal thoughts - - 0 0  PHQ-9 Score - - 0 5        Activities of Daily Living In your present state of health, do you have any difficulty performing the following activities?:  Driving? no Managing money?  no Feeding yourself? no Getting from bed to chair? no Climbing a flight of stairs? yes Preparing food and eating?: no Bathing or showering? no Getting dressed: no Getting to the toilet? no Using the toilet: no Moving around from place to place: yes In the past year have you fallen or had a near fall?: no  Hearing Difficulties:  Do you often ask people to speak up or repeat themselves? no Do you experience ringing or noises in  your ears? yes  Do you have difficulty understanding soft or whispered voices? no  Memory Difficulties:  Do you feel that you have a problem with memory? no  Do you often misplace items? no  Do you feel safe at home?  yes  Sexual Health:   Are you sexually active?  Yes  Do you have more than one partner?  No   Risk Factors  Current exercise habits: as above  Dietary issues discussed:yes  Cardiac risk factors: present   Exam:  BP 133/77   Pulse 79   Ht 5\' 10"  (1.778 m)   Wt 287 lb (130.2 kg)   BMI 41.18 kg/m  Vision by Snellen chart: right eye:see nurse notes, left eye:see nurse notes  Constitutional: VS see above. General Appearance: alert, well-developed, well-nourished, NAD  Ears, Nose, Mouth, Throat: MMM  Neck: No masses, trachea midline.   Respiratory: Normal respiratory effort. no wheeze, no rhonchi, no rales  Cardiovascular:No lower extremity edema.   Musculoskeletal: Gait normal. No clubbing/cyanosis of digits.   Neurological: Normal balance/coordination. No tremor. Recalls 3 objects and able to read face of watch with correct time.   Skin: warm, dry, intact. No rash/ulcer.   Psychiatric: Normal judgment/insight. Normal mood and affect. Oriented x3  C-spine: Nontender to spinal midline.  Tender palpation lower cervical paraspinal musculature.  Decreased cervical motion.  Upper extremity strength reflexes sensation are equal normal throughout.  EXAM: CERVICAL SPINE - COMPLETE 4+ VIEW  COMPARISON:  Intraoperative images July 16, 2009; April 18, 2008  FINDINGS: Frontal, lateral, open-mouth odontoid, and bilateral oblique views were obtained. The patient is status post anterior screw and plate fixation from C3-C5 support hardware intact. There is no evident acute fracture or spondylolisthesis. Prevertebral soft tissues and predental space regions are normal. There is ankylosis of the C3-4, C4-5, C5-6,  and C6-7 disc spaces. There is severe disc  space narrowing at C7-T1. There are large anterior osteophytes at C2 and C3 with pseudoarthrosis anterior at C2-3 disc space level. On the oblique views, there is a is a foraminal narrowing at all levels bilaterally except for C2-3. There is no bony destruction or erosion. Bones are osteoporotic. There is reversal of lordotic curvature. Lung apices are clear. There is nuchal ligament calcification posterior to C6-7.  IMPRESSION: Postoperative change with multilevel ankylosis of disc spaces. Severe disc space narrowing noted at C7-T1. Large anterior osteophytes at C2 and C3 with pseudoarthrosis between the osteophytes anteriorly at C2-3. Multilevel facet arthropathy noted.  No acute fracture or spondylolisthesis. Bones osteoporotic. Reversal of lordotic curvature, likely due to chronic muscle spasm.   Electronically Signed   By: Bretta BangWilliam  Woodruff III M.D.   On: 11/05/2017 08:43 I personally (independently) visualized and performed the interpretation of the images attached in this note.  .     ASSESSMENT/PLAN:   Encounter for Medicare annual wellness exam  Cervical pain: Proceed with trial of physical therapy.  If not better consider MRI for epidural steroid injection or facet injection planning.  Fatigue: Related to inadequately controlled sleep apnea.  Patient is receiving good care via sleep medicine provider.  However encouraged continued use of BiPAP.  Will write letter to sleep medicine physician.  Dietary and lifestyle changes discussed.  Plan for low-carb diet and increased exercise.  Labs updated from outside records.  Continue current regimen.  Recheck 1 month.  Continue to follow along significant health concerns including interstitial lung disease seen on CT scan.  Acute bilateral low back pain without sciatica - Plan: Ambulatory referral to Physical Therapy  Neck pain - Plan: Ambulatory referral to Physical Therapy  Acute pain of both shoulders - Plan:  Ambulatory referral to Physical Therapy  Health Maintenance Health Maintenance  Topic Date Due  . OPHTHALMOLOGY EXAM  01/19/2018  . FOOT EXAM  07/12/2018  . HEMOGLOBIN A1C  12/19/2018  . PNA vac Low Risk Adult (2 of 2 - PPSV23) 07/30/2020  . TETANUS/TDAP  07/30/2025  . COLONOSCOPY  02/23/2026  . INFLUENZA VACCINE  Completed  . Hepatitis C Screening  Completed    Immunization History  Administered Date(s) Administered  . Influenza, High Dose Seasonal PF 01/21/2017  . Influenza,inj,Quad PF,6+ Mos 01/12/2018  . Influenza-Unspecified 02/28/2015, 03/14/2016  . Pneumococcal Conjugate-13 01/21/2017  . Pneumococcal Polysaccharide-23 07/31/2015  . Tdap 07/31/2015     During the course of the visit the patient was educated and counseled about appropriate screening and preventive services as noted above.   Patient Instructions (the written plan) was given to the patient.  Medicare Attestation I have personally reviewed: The patient's medical and social history Their use of alcohol, tobacco or illicit drugs Their current medications and supplements The patient's functional ability including ADLs,fall risks, home safety risks, cognitive, and hearing and visual impairment Diet and physical activities Evidence for depression or mood disorders  The patient's weight, height, BMI, and visual acuity have been recorded in the chart.  I have made referrals, counseling, and provided education to the patient based on review of the above and I have provided the patient with a written personalized care plan for preventive services.

## 2018-06-28 NOTE — Patient Instructions (Addendum)
Thank you for coming in today. Lets do some PT for the neck and back before MRI cervical spine.   I will contact Dr Laretta Bolster  [facility] especially [facility].   Try to set carb goal of 60g per meal.  Lower carb and higher ratio of protein is helpful.  Reduce sugars.   Recheck with me in 1 month to go over neck pain after PT and think about MRI.  We will also discuss diet and exercise again.

## 2018-06-29 ENCOUNTER — Other Ambulatory Visit: Payer: Self-pay | Admitting: Family Medicine

## 2018-06-29 DIAGNOSIS — J849 Interstitial pulmonary disease, unspecified: Secondary | ICD-10-CM | POA: Insufficient documentation

## 2018-06-30 ENCOUNTER — Telehealth (HOSPITAL_COMMUNITY): Payer: Self-pay | Admitting: Emergency Medicine

## 2018-06-30 NOTE — Telephone Encounter (Signed)
Reaching out to patient to offer assistance regarding upcoming cardiac imaging study; pt verbalizes understanding of appt date/time, parking situation and where to check in, pre-test NPO status and medications ordered, and verified current allergies; name and call back number provided for further questions should they arise Rockwell AlexandriaSara Tyan Dy RN Navigator Cardiac [facility] [phone] office [phone] cell  Pt instructed to take propanolol at 7:30a And to skip metformin starting Monday, can resume on Wednesday morning

## 2018-07-04 ENCOUNTER — Ambulatory Visit (HOSPITAL_COMMUNITY)
Admission: RE | Admit: 2018-07-04 | Discharge: 2018-07-04 | Disposition: A | Discharge status: Home / Self Care | Payer: Medicare Other | Source: Ambulatory Visit | Attending: Interventional Cardiology | Admitting: Interventional Cardiology

## 2018-07-04 ENCOUNTER — Ambulatory Visit (HOSPITAL_COMMUNITY): Admission: RE | Admit: 2018-07-04 | Payer: Medicare Other | Source: Ambulatory Visit

## 2018-07-04 DIAGNOSIS — R072 Precordial pain: Secondary | ICD-10-CM | POA: Insufficient documentation

## 2018-07-04 MED ORDER — METOPROLOL TARTRATE 5 MG/5ML IV SOLN
INTRAVENOUS | Status: AC
  Filled 2018-07-04: qty 10

## 2018-07-04 MED ORDER — IOPAMIDOL (ISOVUE-370) INJECTION 76%
80.0000 mL | Freq: Once | INTRAVENOUS | Status: AC | PRN
  Administered 2018-07-04: 80 mL via INTRAVENOUS

## 2018-07-04 MED ORDER — NITROGLYCERIN 0.4 MG SL SUBL
SUBLINGUAL_TABLET | SUBLINGUAL | Status: AC
  Filled 2018-07-04: qty 2

## 2018-07-04 MED ORDER — METOPROLOL TARTRATE 5 MG/5ML IV SOLN
5.0000 mg | INTRAVENOUS | Status: DC | PRN
  Administered 2018-07-04: 5 mg via INTRAVENOUS
  Filled 2018-07-04 (×2): qty 5

## 2018-07-04 MED ORDER — NITROGLYCERIN 0.4 MG SL SUBL
0.8000 mg | SUBLINGUAL_TABLET | Freq: Once | SUBLINGUAL | Status: AC
  Administered 2018-07-04: 0.8 mg via SUBLINGUAL
  Filled 2018-07-04: qty 25

## 2018-07-04 NOTE — Progress Notes (Signed)
CT scan completed. Tolerated well. D/C home walking, awake and alert. In no distress. 

## 2018-07-05 ENCOUNTER — Telehealth: Payer: Self-pay | Admitting: *Deleted

## 2018-07-05 DIAGNOSIS — G4733 Obstructive sleep apnea (adult) (pediatric): Secondary | ICD-10-CM | POA: Diagnosis not present

## 2018-07-05 NOTE — Telephone Encounter (Signed)
-----   Message from Lyn Records, MD sent at 07/05/2018  1:32 PM EST ----- Let the patient know the heart arteries are normal.  No plaque buildup.  The pain is not related to blocked arteries.  This information is very reassuring. A copy will be sent to [name], MD

## 2018-07-05 NOTE — Telephone Encounter (Signed)
Patient notified of result.  Please refer to phone note from today for complete details.   Danielle Rankin, CMA 07/05/2018 2:01 PM   Pt has been notified Coronary Ct results by phone with verbal understanding. Pt thanked me for the call and the good news.

## 2018-07-06 ENCOUNTER — Encounter: Payer: Self-pay | Admitting: Physical Therapy

## 2018-07-06 ENCOUNTER — Ambulatory Visit (INDEPENDENT_AMBULATORY_CARE_PROVIDER_SITE_OTHER): Payer: Medicare Other | Admitting: Physical Therapy

## 2018-07-06 ENCOUNTER — Other Ambulatory Visit: Payer: Self-pay

## 2018-07-06 DIAGNOSIS — M546 Pain in thoracic spine: Secondary | ICD-10-CM | POA: Diagnosis not present

## 2018-07-06 DIAGNOSIS — R29898 Other symptoms and signs involving the musculoskeletal system: Secondary | ICD-10-CM | POA: Diagnosis not present

## 2018-07-06 NOTE — Patient Instructions (Signed)
Access Code: [email or web address]  URL: [email or web address]  Date: 07/06/2018  Prepared by: Moshe Cipro   Exercises  Seated Scapular Retraction - 10 reps - 1 sets - 5 sec hold - 2x daily - 7x weekly  Standing Backward Shoulder Rolls - 10 reps - 1 sets - 2x daily - 7x weekly  Standing Upper Trapezius Mobilization with Small Ball - 10 reps - 3 sets - 1x daily - 7x weekly
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
## 2018-07-11 ENCOUNTER — Ambulatory Visit (INDEPENDENT_AMBULATORY_CARE_PROVIDER_SITE_OTHER): Payer: Medicare Other | Admitting: Family Medicine

## 2018-07-11 ENCOUNTER — Ambulatory Visit (INDEPENDENT_AMBULATORY_CARE_PROVIDER_SITE_OTHER): Payer: Medicare Other | Admitting: Physical Therapy

## 2018-07-11 VITALS — BP 131/69 | HR 64 | Temp 97.9°F | Wt 288.0 lb

## 2018-07-11 DIAGNOSIS — M545 Low back pain, unspecified: Secondary | ICD-10-CM

## 2018-07-11 DIAGNOSIS — R29898 Other symptoms and signs involving the musculoskeletal system: Secondary | ICD-10-CM

## 2018-07-11 DIAGNOSIS — H60392 Other infective otitis externa, left ear: Secondary | ICD-10-CM | POA: Diagnosis not present

## 2018-07-11 DIAGNOSIS — M546 Pain in thoracic spine: Secondary | ICD-10-CM | POA: Diagnosis not present

## 2018-07-11 MED ORDER — NEOMYCIN-POLYMYXIN-HC 3.5-10000-1 OT SUSP: 3.0000 [drp] | Freq: Four times a day (QID) | OTIC | 0 refills | Status: DC

## 2018-07-11 MED ORDER — DOXYCYCLINE HYCLATE 100 MG PO TABS: 100.0000 mg | ORAL_TABLET | Freq: Two times a day (BID) | ORAL | 0 refills | Status: DC

## 2018-07-11 NOTE — Progress Notes (Signed)
Gregory Governor Rooks [name] is a 68 y.o. male who presents to [name] today for left ear pain.  Gregory West notes a 3-day history of pain and swelling of the left external ear and external portion of the ear canal.  He cannot think of any injury or abrasion.  He notes that he does clean his ear with Q-tips although cannot recall any especially traumatic Q-tip experience recently.  He notes pain and swelling starting last few days.  Symptoms are somewhat worsening.  He is tried some hot compress which has helped only a little.  He denies any drainage fevers chills nausea vomiting or diarrhea.   ROS as above:  Exam:  BP 131/69   Pulse 64   Temp 97.9 F (36.6 C) (Oral)   Wt 288 lb (130.6 kg)   BMI 41.32 kg/m  Wt Readings from Last 5 Encounters:  07/11/18 288 lb (130.6 kg)  06/28/18 287 lb (130.2 kg)  06/08/18 289 lb 6.4 oz (131.3 kg)  01/12/18 288 lb (130.6 kg)  12/23/17 286 lb (129.7 kg)    Gen: Well NAD HEENT: EOMI,  MMM normal-appearing right ear, ear canal, and tympanic membrane. Left ear canal mildly erythematous and swollen.  Swelling located at the anti-tragus and conchia cavus.  Erythematous.  The more internal portion of the ear canal is normal-appearing.  Tympanic membrane is normal-appearing.  No discharge.  No vesicles.  Mastoids are nontender laterally. Lungs: Normal work of breathing. CTABL Heart: RRR no MRG Abd: NABS, Soft. Nondistended, Nontender Exts: Brisk capillary refill, warm and well perfused.   Lab and Radiology Results No results found for this or any previous visit (from the past 72 hour(s)). No results found.    Assessment and Plan: 68 y.o. male with cellulitis of the external ear versus otitis externa.  Plan for treatment with Cortisporin eardrops and oral doxycycline.  Recheck if not improving.  If not improving would go ahead and send  in oral Omnicef to double cover strep species a bit better than doxycycline.   Meds ordered this encounter  Medications  . neomycin-polymyxin-hydrocortisone (CORTISPORIN) 3.5-10000-1 OTIC suspension    Sig: Place 3 drops into the left ear 4 (four) times daily.    Dispense:  10 mL    Refill:  0  . doxycycline (VIBRA-TABS) 100 MG tablet    Sig: Take 1 tablet (100 mg total) by mouth 2 (two) times daily.    Dispense:  14 tablet    Refill:  0     Historical information moved to improve visibility of documentation.  Past Medical History:  Diagnosis Date  . Arthritis   . Chest pain    a. Normal cors 2001. b. Neg stress test 2012, 05/2014.  . Diabetes mellitus without complication (HCC)   . Hyperlipidemia   . Hypertensive response to exercise   . Obesity   . Obstructive sleep apnea    Currently untreated    Past Surgical History:  Procedure Laterality Date  . CERVICAL FUSION    . HERNIA REPAIR    . KNEE ARTHROSCOPY  1984   both knee   Social History   Tobacco Use  . Smoking status: Former Smoker    Packs/day: 1.50    Years: 30.00    Pack years: 45.00    Types: Cigarettes    Last attempt to quit: 1990    Years since quitting: 30.1  . Smokeless tobacco: Never Used  Substance  Use Topics  . Alcohol use: No   family history includes Cancer in his father; Diabetes in his maternal grandfather, maternal grandmother, mother, and sister; Heart attack in his maternal grandfather, maternal grandmother, mother, paternal grandfather, and paternal grandmother; Heart disease in his father and mother; Hyperlipidemia in his sister; Hypertension in his maternal grandfather, mother, sister, and sister; Stroke in his maternal grandmother and mother.  Medications: Current Outpatient Medications  Medication Sig Dispense Refill  . aspirin 81 MG tablet Take 81 mg by mouth daily.    . Cholecalciferol (VITAMIN D3) 5000 units CAPS Take 1 capsule by mouth daily.    . empagliflozin (JARDIANCE) 25  MG TABS tablet Take 25 mg by mouth daily.    . Empagliflozin-Linagliptin 25-5 MG TABS Take 1 tablet by mouth daily. 30 tablet 11  . insulin glargine (LANTUS) 100 UNIT/ML injection Inject 40 Units into the skin 2 (two) times daily.    Marland Kitchen. losartan (COZAAR) 25 MG tablet Take 1 tablet (25 mg total) by mouth daily. 90 tablet 1  . metFORMIN (GLUCOPHAGE XR) 750 MG 24 hr tablet Take 1 tablet (750 mg total) by mouth 2 (two) times daily. 180 tablet 3  . omeprazole (PRILOSEC) 40 MG capsule TAKE 1 CAPSULE(40 MG) BY MOUTH DAILY 90 capsule 1  . ONETOUCH VERIO test strip 1 each by Other route as needed.     . pioglitazone (ACTOS) 15 MG tablet Take 15 mg by mouth daily.     . propranolol ER (INDERAL LA) 60 MG 24 hr capsule TAKE 1 CAPSULE(60 MG) BY MOUTH DAILY 90 capsule 0  . rosuvastatin (CRESTOR) 5 MG tablet Take 1 tablet by mouth daily.    . tamsulosin (FLOMAX) 0.4 MG CAPS capsule Take 1 tablet once a day for prostate    . traZODone (DESYREL) 50 MG tablet TAKE 1/2 TO 1 TABLET BY MOUTH AT BEDTIME AS NEEDED FOR SLEEP 90 tablet 3  . doxycycline (VIBRA-TABS) 100 MG tablet Take 1 tablet (100 mg total) by mouth 2 (two) times daily. 14 tablet 0  . neomycin-polymyxin-hydrocortisone (CORTISPORIN) 3.5-10000-1 OTIC suspension Place 3 drops into the left ear 4 (four) times daily. 10 mL 0   No current facility-administered medications for this visit.    Allergies  Allergen Reactions  . Statins     Muscle cramping all over body. Lipitor, Crestor, and pravastatin.  Verdis Prime. Victoza [Liraglutide] Other (See Comments)    Caused pancreatitis     Discussed warning signs or symptoms. Please see discharge instructions. Patient expresses understanding.

## 2018-07-11 NOTE — Patient Instructions (Addendum)
Thank you for coming in today.  Use the ear drops up to 4x daily.  Take the oral doxycycline twice daily.  Ok to use warm compress.  Recheck if not improving.  Let me know if worsening.    Otitis Externa  Otitis externa is an infection of the outer ear canal. The outer ear canal is the area between the outside of the ear and the eardrum. Otitis externa is sometimes called swimmer's ear. What are the causes? Common causes of this condition include:  Swimming in dirty water.  Moisture in the ear.  An injury to the inside of the ear.  An object stuck in the ear.  A cut or scrape on the outside of the ear. What increases the risk? You are more likely to develop this condition if you go swimming often. What are the signs or symptoms? The first symptom of this condition is often itching in the ear. Later symptoms of the condition include:  Swelling of the ear.  Redness in the ear.  Ear pain. The pain may get worse when you pull on your ear.  Pus coming from the ear. How is this diagnosed? This condition may be diagnosed by examining the ear and testing fluid from the ear for bacteria and funguses. How is this treated? This condition may be treated with:  Antibiotic ear drops. These are often given for 10-14 days.  Medicines to reduce itching and swelling. Follow these instructions at home:  If you were prescribed antibiotic ear drops, use them as told by your health care provider. Do not stop using the antibiotic even if your condition improves.  Take over-the-counter and prescription medicines only as told by your health care provider.  Avoid getting water in your ears as told by your health care provider. This may include avoiding swimming or water sports for a few days.  Keep all follow-up visits as told by your health care provider. This is important. How is this prevented?  Keep your ears dry. Use the corner of a towel to dry your ears after you swim or  bathe.  Avoid scratching or putting things in your ear. Doing these things can damage the ear canal or remove the protective wax that lines it, which makes it easier for bacteria and funguses to grow.  Avoid swimming in lakes, polluted water, or pools that may not have enough chlorine. Contact a health care provider if:  You have a fever.  Your ear is still red, swollen, painful, or draining pus after 3 days.  Your redness, swelling, or pain gets worse.  You have a severe headache.  You have redness, swelling, pain, or tenderness in the area behind your ear. Summary  Otitis externa is an infection of the outer ear canal.  Common causes include swimming in dirty water, moisture in the ear, or a cut or scrape in the ear.  Symptoms include pain, redness, and swelling of the ear.  If you were prescribed antibiotic ear drops, use them as told by your health care provider. Do not stop using the antibiotic even if your condition improves. This information is not intended to replace advice given to you by your health care provider. Make sure you discuss any questions you have with your health care provider. Document Released: 05/17/2005 Document Revised: 10/21/2017 Document Reviewed: 10/21/2017 Elsevier Interactive Patient Education  2019 [employer] Inc[employer]   Cellulitis, Adult  Cellulitis is a skin infection. The infected area is usually warm, red, swollen, and tender. This  condition occurs most often in the arms and lower legs. The infection can travel to the muscles, blood, and underlying tissue and become serious. It is very important to get treated for this condition. What are the causes? Cellulitis is caused by bacteria. The bacteria enter through a break in the skin, such as a cut, burn, insect bite, open sore, or crack. What increases the risk? This condition is more likely to occur in people who:  Have a weak body defense system (immune system).  Have open wounds on the skin, such  as cuts, burns, bites, and scrapes. Bacteria can enter the body through these open wounds.  Are older than 67 years of age.  Have diabetes.  Have a type of long-lasting (chronic) liver disease (cirrhosis) or kidney disease.  Are obese.  Have a skin condition such as: ? Itchy rash (eczema). ? Slow movement of blood in the veins (venous stasis). ? Fluid buildup below the skin (edema).  Have had radiation therapy.  Use IV drugs. What are the signs or symptoms? Symptoms of this condition include:  Redness, streaking, or spotting on the skin.  Swollen area of the skin.  Tenderness or pain when an area of the skin is touched.  Warm skin.  A fever.  Chills.  Blisters. How is this diagnosed? This condition is diagnosed based on a medical history and physical exam. You may also have tests, including:  Blood tests.  Imaging tests. How is this treated? Treatment for this condition may include:  Medicines, such as antibiotic medicines or medicines to treat allergies (antihistamines).  Supportive care, such as rest and application of cold or warm cloths (compresses) to the skin.  Hospital care, if the condition is severe. The infection usually starts to get better within 1-2 days of treatment. Follow these instructions at home:  Medicines  Take over-the-counter and prescription medicines only as told by your health care provider.  If you were prescribed an antibiotic medicine, take it as told by your health care provider. Do not stop taking the antibiotic even if you start to feel better. General instructions  Drink enough fluid to keep your urine pale yellow.  Do not touch or rub the infected area.  Raise (elevate) the infected area above the level of your heart while you are sitting or lying down.  Apply warm or cold compresses to the affected area as told by your health care provider.  Keep all follow-up visits as told by your health care provider. This is  important. These visits let your health care provider make sure a more serious infection is not developing. Contact a health care provider if:  You have a fever.  Your symptoms do not begin to improve within 1-2 days of starting treatment.  Your bone or joint underneath the infected area becomes painful after the skin has healed.  Your infection returns in the same area or another area.  You notice a swollen bump in the infected area.  You develop new symptoms.  You have a general ill feeling (malaise) with muscle aches and pains. Get help right away if:  Your symptoms get worse.  You feel very sleepy.  You develop vomiting or diarrhea that persists.  You notice red streaks coming from the infected area.  Your red area gets larger or turns dark in color. These symptoms may represent a serious problem that is an emergency. Do not wait to see if the symptoms will go away. Get medical help right away. Call  your local emergency services (911 in the [place] Do not drive yourself to the hospital. Summary  Cellulitis is a skin infection. This condition occurs most often in the arms and lower legs.  Treatment for this condition may include medicines, such as antibiotic medicines or antihistamines.  Take over-the-counter and prescription medicines only as told by your health care provider. If you were prescribed an antibiotic medicine, do not stop taking the antibiotic even if you start to feel better.  Contact a health care provider if your symptoms do not begin to improve within 1-2 days of starting treatment or your symptoms get worse.  Keep all follow-up visits as told by your health care provider. This is important. These visits let your health care provider make sure that a more serious infection is not developing. This information is not intended to replace advice given to you by your health care provider. Make sure you discuss any questions you have with your health care  provider. Document Released: 02/24/2005 Document Revised: 10/06/2017 Document Reviewed: 10/06/2017 Elsevier Interactive Patient Education  2019 [employer].

## 2018-07-11 NOTE — Therapy (Signed)
[facility] [address] [place], [place], [address] Phone: [phone]   Fax:  [phone]  Physical Therapy Treatment  Patient Details  Name: Gregory ReadyRichard A Happel [name] MRN: [id number] Date of Birth: 1950/09/03 Referring Provider (PT): Rodolph Bongorey, Evan S, MD   Encounter Date: 07/11/2018  PT End of Session - 07/11/18 0927    Visit Number  2    Number of Visits  12    Date for PT Re-Evaluation  08/17/18    PT Start Time  0843    PT Stop Time  0933    PT Time Calculation (min)  50 min    Activity Tolerance  Patient tolerated treatment well    Behavior During Therapy  [facility] for tasks assessed/performed       Past Medical History:  Diagnosis Date  . Arthritis   . Chest pain    a. Normal cors 2001. b. Neg stress test 2012, 05/2014.  . Diabetes mellitus without complication (HCC)   . Hyperlipidemia   . Hypertensive response to exercise   . Obesity   . Obstructive sleep apnea    Currently untreated     Past Surgical History:  Procedure Laterality Date  . CERVICAL FUSION    . HERNIA REPAIR    . KNEE ARTHROSCOPY  1984   both knee    There were no vitals filed for this visit.  Subjective Assessment - 07/11/18 0925    Subjective  Pt presenting today with upper thoracic pain of 5/10. Pt also reporting pain in his left ear and has an appointment this morning with his PCP. Pt did report falling out of his chair yesterday due to balance issues. Pt feels it's releated to whatever is going on with his ear. Pt reported working on his HEP issued at last session.     Pertinent History  hx spinal surgery    Limitations  Sitting;Standing;Walking    How long can you sit comfortably?  couple hours    How long can you stand comfortably?  30 min    Patient Stated Goals  to find out if therapy will resolve the issue, improve pain    Currently in Pain?  Yes    Pain Score  5     Pain Location  Back    Pain Orientation  Upper;Mid    Pain  Type  Chronic pain    Pain Onset  More than a month ago    Multiple Pain Sites  No                       [facility] Adult PT Treatment/Exercise - 07/11/18 0001      Posture/Postural Control   Posture/Postural Control  Postural limitations    Postural Limitations  Rounded Shoulders;Forward head      Neck Exercises: Seated   Other Seated Exercise  scapular retraction 3x5 sec    Other Seated Exercise  Rows with green theraband x 15 reps      Neck Exercises: Supine   Neck Retraction  10 reps;5 secs    Other Supine Exercise  lying on blue pool noodle pec stretch and thoracic and lumbar extension      Neck Exercises: Sidelying   Other Sidelying Exercise  open book stretch: rotation of thoracic and lumbar spine      Modalities   Modalities  Electrical Stimulation;Moist Heat      Moist Heat Therapy   Number Minutes Moist Heat  15  Minutes      Programme researcher, broadcasting/film/videolectrical Stimulation   Electrical Stimulation Location  Rt thoracic spine/rhomboids    Electrical Stimulation Action  IFC    Electrical Stimulation Parameters  80-150 Hz x 15 minutes intensity to tolerance    Electrical Stimulation Goals  Pain      Manual Therapy   Manual Therapy  Soft tissue mobilization    Manual therapy comments  IASTM to thoroacic and cervical spine, medial scapular borders    Soft tissue mobilization  IASTM to cervical and thoracic paraspinals and medial scapular borders x 10 minutes                  PT Long Term Goals - 07/11/18 0929      PT LONG TERM GOAL #1   Title  independent with HEP    Time  6    Period  Weeks    Status  On-going      PT LONG TERM GOAL #2   Title  FOTO score improved to 35% limited for improved function    Time  6    Period  Weeks    Status  New      PT LONG TERM GOAL #3   Title  perform thoracolumbar ROM without pain for improved function    Time  6    Period  Weeks    Status  New      PT LONG TERM GOAL #4   Title  report ability to stand > 1 hour without  increase in pain for improved function    Time  6    Period  Weeks    Status  New            Plan - 07/11/18 [id number]    Clinical Impression Statement  Pt presenting with 5/10 thoracic back pain with worse pain on the right side. Pt instructed in postural awareness throughout the day and tolerating mat exercises and IASTM well. Noraml response to E-stim and heat. Continue skilled PT.     Rehab Potential  Good    PT Frequency  2x / week    PT Duration  6 weeks    PT Next Visit Plan  review HEP, manual/modalities PRN (consider DN), needs more posture exercises and seated posture education    PT Home Exercise Plan  Access Code: [email or web address]    Consulted and Agree with Plan of Care  Patient       Patient will benefit from skilled therapeutic intervention in order to improve the following deficits and impairments:  Increased muscle spasms, Increased fascial restricitons, Pain, Postural dysfunction, Improper body mechanics, Decreased strength  Visit Diagnosis: Pain in thoracic spine  Other symptoms and signs involving the musculoskeletal system  Acute bilateral low back pain without sciatica     Problem List Patient Active Problem List   Diagnosis Date Noted  . Interstitial pulmonary disease (HCC) 06/29/2018  . Hypertension associated with diabetes (HCC) 07/13/2017  . Benign essential tremor 07/13/2017  . Kidney cyst, acquired 01/24/2017  . Seborrheic keratoses 04/01/2016  . Diverticulosis of colon without hemorrhage 02/26/2016  . Anxiety state 10/02/2015  . BPH (benign prostatic hyperplasia) 07/31/2015  . 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 Vitamin D deficiency 03/06/2015  . History of pancreatitis 03/06/2015  . Chronic back pain 03/04/2015  . Type II diabetes mellitus with complication (HCC) 05/15/2014  . Obstructive sleep apnea 05/15/2014  . Hyperlipidemia 05/15/2014  . Glaucoma suspect 10/21/2011    Sharmon LeydenJennifer R Kalinda Romaniello, PT  07/11/2018, 9:36 AM  [facility] [address] [place] [address] [place], [place], [address] Phone: [phone]   Fax:  [phone]  Name: Gregory IRWIN [name] MRN: [id number] Date of Birth: 12-23-1950

## 2018-07-12 ENCOUNTER — Other Ambulatory Visit: Payer: Self-pay | Admitting: Family Medicine

## 2018-07-13 ENCOUNTER — Telehealth: Payer: Self-pay

## 2018-07-13 ENCOUNTER — Ambulatory Visit (INDEPENDENT_AMBULATORY_CARE_PROVIDER_SITE_OTHER): Payer: Medicare Other | Admitting: Physical Therapy

## 2018-07-13 DIAGNOSIS — M546 Pain in thoracic spine: Secondary | ICD-10-CM | POA: Diagnosis not present

## 2018-07-13 DIAGNOSIS — R29898 Other symptoms and signs involving the musculoskeletal system: Secondary | ICD-10-CM

## 2018-07-13 MED ORDER — CEFDINIR 300 MG PO CAPS: 300.0000 mg | ORAL_CAPSULE | Freq: Two times a day (BID) | ORAL | 0 refills | Status: DC

## 2018-07-13 NOTE — Telephone Encounter (Signed)
Gregory West states he was advised to call back if not better in a couple of days. He is taking the medication as prescribed. He reports a slight decrease in the left ear pain. He now has a terrible sore throat, sneezing, watery eyes and a slight cough. Denies fever, chills or sweats. Please advise.

## 2018-07-13 NOTE — Telephone Encounter (Signed)
Patient advised of recommendations.  
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
## 2018-07-13 NOTE — Telephone Encounter (Signed)
We will send in second antibiotic that we discussed in clinic.  This will help if symptoms are due to bacterial infection.  If symptoms due to virus it will simply have to run its course.  Continue over-the-counter medications as needed for symptom control otherwise.

## 2018-07-13 NOTE — Patient Instructions (Signed)
Updated HEP: Access Code: [email or web address]  URL: [email or web address]  Date: 07/13/2018  Prepared by: Mayer Camel   Exercises  Seated Scapular Retraction - 10 reps - 1 sets - 5 sec hold - 2x daily - 7x weekly  Standing Backward Shoulder Rolls - 10 reps - 1 sets - 2x daily - 7x weekly  Standing Upper Trapezius Mobilization with Small Ball - 10 reps - 1 sets - 1x daily - 7x weekly  Doorway Pec Stretch at 90 Degrees Abduction - 5 reps - 1 sets - 1x daily - 7x weekly  Sidelying Thoracic Rotation with Open Book - 5 reps - 1 sets - 5 seconds. hold - 1x daily - 7x weekly

## 2018-07-18 ENCOUNTER — Encounter: Payer: Self-pay | Admitting: Physical Therapy

## 2018-07-18 ENCOUNTER — Ambulatory Visit (INDEPENDENT_AMBULATORY_CARE_PROVIDER_SITE_OTHER): Payer: Medicare Other | Admitting: Physical Therapy

## 2018-07-18 DIAGNOSIS — M545 Low back pain, unspecified: Secondary | ICD-10-CM

## 2018-07-18 DIAGNOSIS — R29898 Other symptoms and signs involving the musculoskeletal system: Secondary | ICD-10-CM

## 2018-07-18 DIAGNOSIS — M546 Pain in thoracic spine: Secondary | ICD-10-CM

## 2018-07-18 NOTE — Therapy (Signed)
[facility] [place], [place], [address] Phone: [phone]   Fax:  [phone]  Physical Therapy Treatment  Patient Details  Name: Gregory DETWEILER [name] MRN: [id number] Date of Birth: 1950-09-27 Referring Provider (PT): [name], MD   Encounter Date: 07/18/2018  PT End of Session - 07/18/18 0938    Visit Number  4    Number of Visits  12    Date for PT Re-Evaluation  08/17/18    PT Start Time  0848    PT Stop Time  0943    PT Time Calculation (min)  55 min    Activity Tolerance  Patient tolerated treatment well    Behavior During Therapy  [facility] for tasks assessed/performed       Past Medical History:  Diagnosis Date  . Arthritis   . Chest pain    a. Normal cors 2001. b. Neg stress test 2012, 05/2014.  . Diabetes mellitus without complication ([place])   . Hyperlipidemia   . Hypertensive response to exercise   . Obesity   . Obstructive sleep apnea    Currently untreated     Past Surgical History:  Procedure Laterality Date  . CERVICAL FUSION    . HERNIA REPAIR    . KNEE ARTHROSCOPY  1984   both knee    There were no vitals filed for this visit.  Subjective Assessment - 07/18/18 0850    Subjective  feeling "great" then reports having pain in upper back up to neck c/o pain 8/10.  now 6/10    Patient Stated Goals  to find out if therapy will resolve the issue, improve pain    Currently in Pain?  Yes    Pain Score  6     Pain Location  Back    Pain Orientation  Right;Mid;Upper    Pain Descriptors / Indicators  Aching    Pain Type  Chronic pain    Pain Onset  More than a month ago    Pain Frequency  Constant    Aggravating Factors   pushing;     Pain Relieving Factors  hot shower and heat; doorway stretch helps                       [facility] Adult PT Treatment/Exercise - 07/18/18 0851      Exercises   Exercises  Neck;Lumbar      Neck Exercises: Theraband   Rows  20  reps;Green    Rows Limitations  standing; 5 sec hold      Neck Exercises: Seated   Other Seated Exercise  reverse fly, external rotation x 10 bil around noodle with 3 sec hold      Lumbar Exercises: Stretches   Quadruped Mid Back Stretch Limitations  seated with red physioball mid/Lt/Rt 3x30 sec      Lumbar Exercises: Aerobic   Tread Mill  2.5 mph x 5 min      Moist Heat Therapy   Number Minutes Moist Heat  15 Minutes    Moist Heat Location  --   thoracic     Electrical Stimulation   Electrical Stimulation Location  bil thoracic/lumbar spine    Electrical Stimulation Action  IFC    Electrical Stimulation Parameters  to tolerance x 15 min    Electrical Stimulation Goals  Pain      Neck Exercises: Stretches   Other Neck Stretches  doorway stretch mid and lower level  x 20 sec x 2 reps each position    Other Neck Stretches  rhomboid stretch x 30 sec x 2 reps each side (single side doorway stretch)                  PT Long Term Goals - 07/11/18 0929      PT LONG TERM GOAL #1   Title  independent with HEP    Time  6    Period  Weeks    Status  On-going      PT LONG TERM GOAL #2   Title  FOTO score improved to 35% limited for improved function    Time  6    Period  Weeks    Status  New      PT LONG TERM GOAL #3   Title  perform thoracolumbar ROM without pain for improved function    Time  6    Period  Weeks    Status  New      PT LONG TERM GOAL #4   Title  report ability to stand > 1 hour without increase in pain for improved function    Time  6    Period  Weeks    Status  New            Plan - 07/18/18 0623    Clinical Impression Statement  Pt reported pain decreased to 3/10 after exercises today, and adjusting rhomboid stretch to single arm decreased strain and pain in neck.  Overall progressing well with decreasing pain, and no goals met at this time.    Rehab Potential  Good    PT Frequency  2x / week    PT Duration  6 weeks    PT  Treatment/Interventions  ADLs/Self Care Home Management;Cryotherapy;Ultrasound;Traction;Moist Heat;Electrical Stimulation;Functional mobility training;Therapeutic activities;Therapeutic exercise;Patient/family education;Manual techniques;Dry needling;Passive range of motion;Taping    PT Next Visit Plan  continue postural strengthening and education; modalities as indicated.     PT Home Exercise Plan  Access Code: [email or web address]    Consulted and Agree with Plan of Care  Patient       Patient will benefit from skilled therapeutic intervention in order to improve the following deficits and impairments:  Increased muscle spasms, Increased fascial restricitons, Pain, Postural dysfunction, Improper body mechanics, Decreased strength  Visit Diagnosis: Pain in thoracic spine  Other symptoms and signs involving the musculoskeletal system  Acute bilateral low back pain without sciatica     Problem List Patient Active Problem List   Diagnosis Date Noted  . Interstitial pulmonary disease ([place]) 06/29/2018  . Hypertension associated with diabetes ([place]) 07/13/2017  . Benign essential tremor 07/13/2017  . Kidney cyst, acquired 01/24/2017  . Seborrheic keratoses 04/01/2016  . Diverticulosis of colon without hemorrhage 02/26/2016  . Anxiety state 10/02/2015  . BPH (benign prostatic hyperplasia) 07/31/2015  . Hypogonadotropic hypogonadism ([place]) 03/06/2015  . Vitamin D deficiency 03/06/2015  . History of pancreatitis 03/06/2015  . Chronic back pain 03/04/2015  . Type II diabetes mellitus with complication ([place]) 76/16/0737  . Obstructive sleep apnea 05/15/2014  . Hyperlipidemia 05/15/2014  . Glaucoma suspect 10/21/2011      Laureen Abrahams, PT, DPT 07/18/18 9:42 AM    [facility] [place], [place], [address] Phone: [phone]   Fax:  [phone]  Name: Gregory PAVEY [name] MRN: [id number] Date of Birth:  1951-05-31

## 2018-07-20 ENCOUNTER — Encounter: Payer: Medicare Other | Admitting: Physical Therapy

## 2018-07-21 ENCOUNTER — Encounter: Payer: Self-pay | Admitting: Physical Therapy

## 2018-07-21 ENCOUNTER — Ambulatory Visit (INDEPENDENT_AMBULATORY_CARE_PROVIDER_SITE_OTHER): Payer: Medicare Other | Admitting: Physical Therapy

## 2018-07-21 DIAGNOSIS — R29898 Other symptoms and signs involving the musculoskeletal system: Secondary | ICD-10-CM | POA: Diagnosis not present

## 2018-07-21 DIAGNOSIS — M546 Pain in thoracic spine: Secondary | ICD-10-CM | POA: Diagnosis not present
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
## 2018-07-25 ENCOUNTER — Ambulatory Visit (INDEPENDENT_AMBULATORY_CARE_PROVIDER_SITE_OTHER): Payer: Medicare Other | Admitting: Physical Therapy

## 2018-07-25 DIAGNOSIS — R29898 Other symptoms and signs involving the musculoskeletal system: Secondary | ICD-10-CM

## 2018-07-25 DIAGNOSIS — M546 Pain in thoracic spine: Secondary | ICD-10-CM | POA: Diagnosis not present
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
## 2018-07-27 ENCOUNTER — Ambulatory Visit (INDEPENDENT_AMBULATORY_CARE_PROVIDER_SITE_OTHER): Payer: Medicare Other | Admitting: Physical Therapy

## 2018-07-27 DIAGNOSIS — M546 Pain in thoracic spine: Secondary | ICD-10-CM

## 2018-07-27 DIAGNOSIS — R29898 Other symptoms and signs involving the musculoskeletal system: Secondary | ICD-10-CM
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
## 2018-07-31 ENCOUNTER — Ambulatory Visit (INDEPENDENT_AMBULATORY_CARE_PROVIDER_SITE_OTHER): Payer: Medicare Other | Admitting: Family Medicine

## 2018-07-31 ENCOUNTER — Encounter: Payer: Self-pay | Admitting: Family Medicine

## 2018-07-31 VITALS — BP 123/60 | HR 69 | Wt 284.0 lb

## 2018-07-31 DIAGNOSIS — M542 Cervicalgia: Secondary | ICD-10-CM

## 2018-07-31 DIAGNOSIS — E118 Type 2 diabetes mellitus with unspecified complications: Secondary | ICD-10-CM | POA: Diagnosis not present

## 2018-07-31 DIAGNOSIS — H60392 Other infective otitis externa, left ear: Secondary | ICD-10-CM

## 2018-07-31 NOTE — Progress Notes (Signed)
Gregory Governor Rooks [name] is a 68 y.o. male who presents to [facility]: Primary Care Sports Medicine today for thoracic back pain and left ear pain.  Patient was seen over a month ago for thoracic back pain.  He is received physical therapy and notes significant symptom improvement.  He has been doing home exercise program and is happy with how things are going.  Additionally he was seen on February 11 for left ear pain and is feeling much better.  He thinks the swelling and pain has completely resolved.  Patient notes that he needs a referral to podiatry for management of his diabetic foot exams and screening and treatment.  His last podiatrist has retired. ROS as above:  Exam:  BP 123/60   Pulse 69   Wt 284 lb (128.8 kg)   BMI 40.75 kg/m  Wt Readings from Last 5 Encounters:  07/31/18 284 lb (128.8 kg)  07/11/18 288 lb (130.6 kg)  06/28/18 287 lb (130.2 kg)  06/08/18 289 lb 6.4 oz (131.3 kg)  01/12/18 288 lb (130.6 kg)    Gen: Well NAD HEENT: EOMI,  MMM left ear normal external ear and ear canal. Lungs: Normal work of breathing. CTABL Heart: RRR no MRG Abd: NABS, Soft. Nondistended, Nontender Exts: Brisk capillary refill, warm and well perfused.  C-spine: Limited cervical motion.  Lab and Radiology Results No results found for this or any previous visit (from the past 72 hour(s)). No results found.    Assessment and Plan: 68 y.o. male with  Thoracic and cervical spine pain.  Significant improvement with physical therapy.  Patient does still have some chronic pain related to degenerative changes and surgical fusion.  Plan to continue home exercise program and recheck as needed.  Ear pain: Resolved with oral and topical antibiotics.  Recheck as needed.   Referral placed for local podiatrist. PDMP not reviewed this encounter. Orders Placed This Encounter  Procedures  .  Ambulatory referral to Podiatry    Referral Priority:   Routine    Referral Type:   Consultation    Referral Reason:   Specialty Services Required    Requested Specialty:   Podiatry    Number of Visits Requested:   1   No orders of the defined types were placed in this encounter.    Historical information moved to improve visibility of documentation.  Past Medical History:  Diagnosis Date  . Arthritis   . Chest pain    a. Normal cors 2001. b. Neg stress test 2012, 05/2014.  . Diabetes mellitus without complication (HCC)   . Hyperlipidemia   . Hypertensive response to exercise   . Obesity   . Obstructive sleep apnea    Currently untreated    Past Surgical History:  Procedure Laterality Date  . CERVICAL FUSION    . HERNIA REPAIR    . KNEE ARTHROSCOPY  1984   both knee   Social History   Tobacco Use  . Smoking status: Former Smoker    Packs/day: 1.50    Years: 30.00    Pack years: 45.00    Types: Cigarettes    Last attempt to quit: 1990    Years since quitting: 30.1  . Smokeless tobacco: Never Used  Substance Use Topics  . Alcohol use: No   family history includes Cancer in his father; Diabetes in his maternal grandfather, maternal grandmother, mother, and sister; Heart attack in his maternal grandfather, maternal grandmother, mother, paternal grandfather,  and paternal grandmother; Heart disease in his father and mother; Hyperlipidemia in his sister; Hypertension in his maternal grandfather, mother, sister, and sister; Stroke in his maternal grandmother and mother.  Medications: Current Outpatient Medications  Medication Sig Dispense Refill  . aspirin 81 MG tablet Take 81 mg by mouth daily.    . Cholecalciferol (VITAMIN D3) 5000 units CAPS Take 1 capsule by mouth daily.    . empagliflozin (JARDIANCE) 25 MG TABS tablet Take 25 mg by mouth daily.    . Empagliflozin-Linagliptin 25-5 MG TABS Take 1 tablet by mouth daily. 30 tablet 11  . insulin glargine (LANTUS) 100  UNIT/ML injection Inject 40 Units into the skin 2 (two) times daily.    Marland Kitchen losartan (COZAAR) 25 MG tablet Take 1 tablet (25 mg total) by mouth daily. 90 tablet 1  . metFORMIN (GLUCOPHAGE XR) 750 MG 24 hr tablet Take 1 tablet (750 mg total) by mouth 2 (two) times daily. 180 tablet 3  . neomycin-polymyxin-hydrocortisone (CORTISPORIN) 3.5-10000-1 OTIC suspension Place 3 drops into the left ear 4 (four) times daily. 10 mL 0  . omeprazole (PRILOSEC) 40 MG capsule TAKE 1 CAPSULE(40 MG) BY MOUTH DAILY 90 capsule 1  . ONETOUCH VERIO test strip 1 each by Other route as needed.     . pioglitazone (ACTOS) 15 MG tablet Take 15 mg by mouth daily.     . propranolol ER (INDERAL LA) 60 MG 24 hr capsule TAKE 1 CAPSULE(60 MG) BY MOUTH DAILY 90 capsule 0  . rosuvastatin (CRESTOR) 5 MG tablet Take 1 tablet by mouth daily.    . tamsulosin (FLOMAX) 0.4 MG CAPS capsule Take 1 tablet once a day for prostate    . traZODone (DESYREL) 50 MG tablet TAKE 1/2 TO 1 TABLET BY MOUTH AT BEDTIME AS NEEDED FOR SLEEP 90 tablet 3   No current facility-administered medications for this visit.    Allergies  Allergen Reactions  . Statins     Muscle cramping all over body. Lipitor, Crestor, and pravastatin.  Verdis Prime [Liraglutide] Other (See Comments)    Caused pancreatitis     Discussed warning signs or symptoms. Please see discharge instructions. Patient expresses understanding.

## 2018-07-31 NOTE — Patient Instructions (Signed)
Thank you for coming in today. You should hear from podiatry soon.  Let me know if you dont hear anything.  Recheck in 6 months if all is well.  Return sooner if needed.

## 2018-08-21 ENCOUNTER — Other Ambulatory Visit: Payer: Self-pay

## 2018-08-21 ENCOUNTER — Ambulatory Visit (INDEPENDENT_AMBULATORY_CARE_PROVIDER_SITE_OTHER): Payer: Medicare Other | Admitting: Family Medicine

## 2018-08-21 VITALS — BP 122/75 | HR 64 | Temp 98.5°F

## 2018-08-21 DIAGNOSIS — R059 Cough, unspecified: Secondary | ICD-10-CM

## 2018-08-21 DIAGNOSIS — R05 Cough: Secondary | ICD-10-CM | POA: Diagnosis not present

## 2018-08-21 MED ORDER — GUAIFENESIN-CODEINE 100-10 MG/5ML PO SOLN: 5.0000 mL | Freq: Four times a day (QID) | ORAL | 0 refills | Status: DC | PRN

## 2018-08-21 NOTE — Progress Notes (Addendum)
Virtual Visit  via Video or Phone Note  I connected with@ on 08/21/18 at 142 by telemedicine and verified that I am speaking with the correct person using two identifiers.   I discussed the limitations of evaluation and management by telemedicine and the availability of in person appointments. The patient expressed understanding and agreed to proceed.  History of Present Illness: Gregory GUT [name] is a 68 y.o. male who would like to discuss Cough worsening over the past 1.5 weeks.   [facility] has a 1-1/2-week history of worsening dry nonproductive cough.  Initially he had some mild body aches and diarrhea but denies any fever.  The symptoms of the past but he continues to have a cough.  He has some chest congestion but denies shortness of breath or wheezing.  The cough is interfering with sleep as it is keeping him up at night.  He is used some leftover codeine cough syrup which is helped.  His wife is experiencing mild body aches but does not have a cough and feels well otherwise with no fevers or chills.  He notes in the past he is used [employer] which have not helped very well at all.  Patient denies any exposure to people suspected of having COVID-19.    Observations/Objective: BP 122/75   Pulse 64   Temp 98.5 F (36.9 C)  Wt Readings from Last 5 Encounters:  07/31/18 284 lb (128.8 kg)  07/11/18 288 lb (130.6 kg)  06/28/18 287 lb (130.2 kg)  06/08/18 289 lb 6.4 oz (131.3 kg)  01/12/18 288 lb (130.6 kg)   Exam: Normal Speech.  No significant shortness of breath.  Able to complete sentences.  No audible wheezing or stridor.  Lab and Radiology Results No results found for this or any previous visit (from the past 72 hour(s)). No results found.   Assessment and Plan: 68 y.o. male with  Cough.  Patient had initial viral prodrome with systemic symptoms and now has a dry nonproductive cough.  This is obviously concerning for COVID-19.  However we do not have an  obvious good way to test and testing at this time would likely not change management.  Plan for symptomatic management and watchful waiting.  She was over-the-counter medications and codeine-based cough syrup.  Recheck sooner if needed.  PDMP reviewed during this encounter. No orders of the defined types were placed in this encounter.  Meds ordered this encounter  Medications  . guaiFENesin-codeine 100-10 MG/5ML syrup    Sig: Take 5 mLs by mouth every 6 (six) hours as needed for cough. Take mostly at bedtime    Dispense:  120 mL    Refill:  0    Follow Up Instructions:    I discussed the assessment and treatment plan with the patient. The patient was provided an opportunity to ask questions and all were answered. The patient agreed with the plan and demonstrated an understanding of the instructions.   The patient was advised to call back or seek an in-person evaluation if the symptoms worsen or if the condition fails to improve as anticipated.  I provided 15 minutes of non-face-to-face time during this encounter.    Historical information moved to improve visibility of documentation.  Past Medical History:  Diagnosis Date  . Arthritis   . Chest pain    a. Normal cors 2001. b. Neg stress test 2012, 05/2014.  . Diabetes mellitus without complication (HCC)   . Hyperlipidemia   . Hypertensive response to  exercise   . Obesity   . Obstructive sleep apnea    Currently untreated    Past Surgical History:  Procedure Laterality Date  . CERVICAL FUSION    . HERNIA REPAIR    . KNEE ARTHROSCOPY  1984   both knee   Social History   Tobacco Use  . Smoking status: Former Smoker    Packs/day: 1.50    Years: 30.00    Pack years: 45.00    Types: Cigarettes    Last attempt to quit: 1990    Years since quitting: 30.2  . Smokeless tobacco: Never Used  Substance Use Topics  . Alcohol use: No   family history includes Cancer in his father; Diabetes in his maternal grandfather,  maternal grandmother, mother, and sister; Heart attack in his maternal grandfather, maternal grandmother, mother, paternal grandfather, and paternal grandmother; Heart disease in his father and mother; Hyperlipidemia in his sister; Hypertension in his maternal grandfather, mother, sister, and sister; Stroke in his maternal grandmother and mother.  Medications: Current Outpatient Medications  Medication Sig Dispense Refill  . aspirin 81 MG tablet Take 81 mg by mouth daily.    . Cholecalciferol (VITAMIN D3) 5000 units CAPS Take 1 capsule by mouth daily.    . empagliflozin (JARDIANCE) 25 MG TABS tablet Take 25 mg by mouth daily.    . Empagliflozin-Linagliptin 25-5 MG TABS Take 1 tablet by mouth daily. 30 tablet 11  . guaiFENesin-codeine 100-10 MG/5ML syrup Take 5 mLs by mouth every 6 (six) hours as needed for cough. Take mostly at bedtime 120 mL 0  . insulin glargine (LANTUS) 100 UNIT/ML injection Inject 40 Units into the skin 2 (two) times daily.    Marland Kitchen losartan (COZAAR) 25 MG tablet Take 1 tablet (25 mg total) by mouth daily. 90 tablet 1  . metFORMIN (GLUCOPHAGE XR) 750 MG 24 hr tablet Take 1 tablet (750 mg total) by mouth 2 (two) times daily. 180 tablet 3  . neomycin-polymyxin-hydrocortisone (CORTISPORIN) 3.5-10000-1 OTIC suspension Place 3 drops into the left ear 4 (four) times daily. 10 mL 0  . omeprazole (PRILOSEC) 40 MG capsule TAKE 1 CAPSULE(40 MG) BY MOUTH DAILY 90 capsule 1  . ONETOUCH VERIO test strip 1 each by Other route as needed.     . pioglitazone (ACTOS) 15 MG tablet Take 15 mg by mouth daily.     . propranolol ER (INDERAL LA) 60 MG 24 hr capsule TAKE 1 CAPSULE(60 MG) BY MOUTH DAILY 90 capsule 0  . rosuvastatin (CRESTOR) 5 MG tablet Take 1 tablet by mouth daily.    . tamsulosin (FLOMAX) 0.4 MG CAPS capsule Take 1 tablet once a day for prostate    . traZODone (DESYREL) 50 MG tablet TAKE 1/2 TO 1 TABLET BY MOUTH AT BEDTIME AS NEEDED FOR SLEEP 90 tablet 3   No current  facility-administered medications for this visit.    Allergies  Allergen Reactions  . Statins     Muscle cramping all over body. Lipitor, Crestor, and pravastatin.  Verdis Prime [Liraglutide] Other (See Comments)    Caused pancreatitis    PDMP reviewed during this encounter. No orders of the defined types were placed in this encounter.  Meds ordered this encounter  Medications  . guaiFENesin-codeine 100-10 MG/5ML syrup    Sig: Take 5 mLs by mouth every 6 (six) hours as needed for cough. Take mostly at bedtime    Dispense:  120 mL    Refill:  0  Clementeen Graham, MD  Addendum to correct incorrect date due to templating error

## 2018-10-03 ENCOUNTER — Other Ambulatory Visit: Payer: Self-pay | Admitting: Family Medicine

## 2018-11-13 DIAGNOSIS — M25572 Pain in left ankle and joints of left foot: Secondary | ICD-10-CM | POA: Diagnosis not present

## 2018-11-13 DIAGNOSIS — M2042 Other hammer toe(s) (acquired), left foot: Secondary | ICD-10-CM | POA: Diagnosis not present

## 2018-11-13 DIAGNOSIS — M2041 Other hammer toe(s) (acquired), right foot: Secondary | ICD-10-CM | POA: Diagnosis not present

## 2018-11-13 DIAGNOSIS — B351 Tinea unguium: Secondary | ICD-10-CM | POA: Diagnosis not present

## 2018-11-13 DIAGNOSIS — M25571 Pain in right ankle and joints of right foot: Secondary | ICD-10-CM | POA: Diagnosis not present

## 2018-11-13 DIAGNOSIS — E1142 Type 2 diabetes mellitus with diabetic polyneuropathy: Secondary | ICD-10-CM | POA: Diagnosis not present

## 2018-11-20 DIAGNOSIS — Z794 Long term (current) use of insulin: Secondary | ICD-10-CM | POA: Diagnosis not present

## 2018-11-20 DIAGNOSIS — E1165 Type 2 diabetes mellitus with hyperglycemia: Secondary | ICD-10-CM | POA: Diagnosis not present

## 2018-11-20 DIAGNOSIS — E782 Mixed hyperlipidemia: Secondary | ICD-10-CM | POA: Diagnosis not present

## 2018-11-20 LAB — HEPATIC FUNCTION PANEL
ALT: 20 (ref 10–40)
AST: 16 (ref 14–40)
Alkaline Phosphatase: 58 (ref 25–125)
Bilirubin, Total: 0.4

## 2018-11-20 LAB — BASIC METABOLIC PANEL
BUN: 15 (ref 4–21)
Creatinine: 0.8 (ref 0.6–1.3)
Glucose: 124
Potassium: 4.2 (ref 3.4–5.3)
Sodium: 138 (ref 137–147)

## 2018-11-20 LAB — LIPID PANEL
Cholesterol: 170 (ref 0–200)
HDL: 41 (ref 35–70)
LDL Cholesterol: 102
Triglycerides: 105 (ref 40–160)

## 2018-11-20 LAB — HEMOGLOBIN A1C: Hemoglobin A1C: 8

## 2018-11-24 ENCOUNTER — Other Ambulatory Visit: Payer: Self-pay | Admitting: Family Medicine

## 2018-11-24 DIAGNOSIS — E230 Hypopituitarism: Secondary | ICD-10-CM | POA: Diagnosis not present

## 2018-11-24 DIAGNOSIS — E782 Mixed hyperlipidemia: Secondary | ICD-10-CM | POA: Diagnosis not present

## 2018-11-24 DIAGNOSIS — N451 Epididymitis: Secondary | ICD-10-CM

## 2018-11-24 DIAGNOSIS — E1165 Type 2 diabetes mellitus with hyperglycemia: Secondary | ICD-10-CM | POA: Diagnosis not present

## 2018-11-24 DIAGNOSIS — I10 Essential (primary) hypertension: Secondary | ICD-10-CM | POA: Diagnosis not present

## 2018-11-24 DIAGNOSIS — E559 Vitamin D deficiency, unspecified: Secondary | ICD-10-CM | POA: Diagnosis not present

## 2018-11-24 DIAGNOSIS — Z794 Long term (current) use of insulin: Secondary | ICD-10-CM | POA: Diagnosis not present

## 2018-12-11 ENCOUNTER — Other Ambulatory Visit (HOSPITAL_BASED_OUTPATIENT_CLINIC_OR_DEPARTMENT_OTHER): Payer: Medicare Other

## 2018-12-12 ENCOUNTER — Ambulatory Visit (HOSPITAL_BASED_OUTPATIENT_CLINIC_OR_DEPARTMENT_OTHER)
Admission: RE | Admit: 2018-12-12 | Discharge: 2018-12-12 | Disposition: A | Discharge status: Home / Self Care | Payer: Medicare Other | Source: Ambulatory Visit | Attending: Pulmonary Disease | Admitting: Pulmonary Disease

## 2018-12-12 ENCOUNTER — Other Ambulatory Visit: Payer: Self-pay

## 2018-12-12 DIAGNOSIS — R0602 Shortness of breath: Secondary | ICD-10-CM | POA: Diagnosis not present

## 2018-12-12 DIAGNOSIS — J849 Interstitial pulmonary disease, unspecified: Secondary | ICD-10-CM | POA: Diagnosis not present

## 2018-12-15 ENCOUNTER — Telehealth: Payer: Self-pay | Admitting: Pulmonary Disease

## 2018-12-15 NOTE — Telephone Encounter (Signed)
LMTCB  Please let the patient know this showed some scarring in his lungs, unchanged which is good. Discuss next visit.  Thanks,  B

## 2018-12-15 NOTE — Telephone Encounter (Signed)
Pt made aware of results of CT scan. Nothing further needed.

## 2018-12-18 ENCOUNTER — Encounter: Payer: Medicare Other | Admitting: Physician Assistant

## 2018-12-18 ENCOUNTER — Other Ambulatory Visit: Payer: Self-pay

## 2018-12-18 DIAGNOSIS — Z20822 Contact with and (suspected) exposure to covid-19: Secondary | ICD-10-CM

## 2018-12-18 DIAGNOSIS — Z20828 Contact with and (suspected) exposure to other viral communicable diseases: Secondary | ICD-10-CM

## 2018-12-19 NOTE — Progress Notes (Signed)
This encounter was created in error - please disregard.  This encounter was created in error - please disregard.

## 2018-12-21 LAB — NOVEL CORONAVIRUS, NAA: SARS-CoV-2, NAA: NOT DETECTED

## 2018-12-21 LAB — SPECIMEN STATUS REPORT

## 2019-01-23 ENCOUNTER — Telehealth: Payer: Self-pay | Admitting: Family Medicine

## 2019-01-23 DIAGNOSIS — Z8249 Family history of ischemic heart disease and other diseases of the circulatory system: Secondary | ICD-10-CM | POA: Diagnosis not present

## 2019-01-23 DIAGNOSIS — E785 Hyperlipidemia, unspecified: Secondary | ICD-10-CM | POA: Diagnosis not present

## 2019-01-23 DIAGNOSIS — E0921 Drug or chemical induced diabetes mellitus with diabetic nephropathy: Secondary | ICD-10-CM | POA: Diagnosis not present

## 2019-01-23 NOTE — Telephone Encounter (Signed)
Received order for cardiovascular health diagnosis next generation genetic testing request form from [facility] labs.  Will complete form and fax back.  Will send copy to scan as well.

## 2019-01-26 DIAGNOSIS — Z8249 Family history of ischemic heart disease and other diseases of the circulatory system: Secondary | ICD-10-CM | POA: Diagnosis not present

## 2019-01-26 DIAGNOSIS — E785 Hyperlipidemia, unspecified: Secondary | ICD-10-CM | POA: Diagnosis not present

## 2019-01-26 DIAGNOSIS — E0921 Drug or chemical induced diabetes mellitus with diabetic nephropathy: Secondary | ICD-10-CM | POA: Diagnosis not present

## 2019-01-27 ENCOUNTER — Encounter: Payer: Self-pay | Admitting: Family Medicine

## 2019-01-29 MED ORDER — TAMSULOSIN HCL 0.4 MG PO CAPS: 0.4000 mg | ORAL_CAPSULE | Freq: Every day | ORAL | 3 refills | Status: DC

## 2019-01-31 ENCOUNTER — Encounter: Payer: Self-pay | Admitting: Family Medicine

## 2019-01-31 ENCOUNTER — Ambulatory Visit (INDEPENDENT_AMBULATORY_CARE_PROVIDER_SITE_OTHER): Payer: Medicare Other | Admitting: Family Medicine

## 2019-01-31 ENCOUNTER — Other Ambulatory Visit: Payer: Self-pay

## 2019-01-31 VITALS — BP 133/60 | HR 76 | Temp 98.2°F | Wt 286.0 lb

## 2019-01-31 DIAGNOSIS — Z23 Encounter for immunization: Secondary | ICD-10-CM | POA: Diagnosis not present

## 2019-01-31 DIAGNOSIS — J329 Chronic sinusitis, unspecified: Secondary | ICD-10-CM

## 2019-01-31 DIAGNOSIS — I152 Hypertension secondary to endocrine disorders: Secondary | ICD-10-CM

## 2019-01-31 DIAGNOSIS — H1012 Acute atopic conjunctivitis, left eye: Secondary | ICD-10-CM

## 2019-01-31 DIAGNOSIS — I10 Essential (primary) hypertension: Secondary | ICD-10-CM

## 2019-01-31 DIAGNOSIS — E1159 Type 2 diabetes mellitus with other circulatory complications: Secondary | ICD-10-CM | POA: Diagnosis not present

## 2019-01-31 MED ORDER — FLUTICASONE PROPIONATE 50 MCG/ACT NA SUSP: NASAL | 3 refills | Status: DC

## 2019-01-31 NOTE — Progress Notes (Signed)
Gregory Governor Rooks [name] is a 68 y.o. male who presents to [facility]: Primary Care Sports Medicine today for follow-up hypertension, eye burning, phlegm in back of throat.  Patient has diabetes which is managed with endocrinology.  He takes medications listed below.  His last A1c was checked in late June and was 8.0.  He also had fasting lipid and metabolic panel listed below.  He tolerates his medications well.  He has a burning sensation in his left eye.  He can sometimes this causes blurry vision however normally he does not have any blurry vision at all.  Sometimes he has a foreign body sensation although he denies any incident or foreign body got in his eye.  He has a history of dry eye and has been using Systane eyedrops.  He also notes some phlegm in the back of his throat.  He notes this occurs when he is sleeping and interferes with his ability to use a CPAP.  Has not been using his CPAP much as result.  He denies significant nasal drainage during the day.  No fevers or chills.   ROS as above:  Exam:  BP 133/60   Pulse 76   Temp 98.2 F (36.8 C) (Oral)   Wt 286 lb (129.7 kg)   BMI 41.04 kg/m  Wt Readings from Last 5 Encounters:  01/31/19 286 lb (129.7 kg)  07/31/18 284 lb (128.8 kg)  07/11/18 288 lb (130.6 kg)  06/28/18 287 lb (130.2 kg)  06/08/18 289 lb 6.4 oz (131.3 kg)    Gen: Well NAD HEENT: EOMI,  MMM normal-appearing eyes bilaterally.  No foreign body visible.  No significant conjunctival injection.  Normal posterior pharynx with no significant postnasal drainage.  Normal nasal turbinates. Lungs: Normal work of breathing. CTABL Heart: RRR no MRG Abd: NABS, Soft. Nondistended, Nontender Exts: Brisk capillary refill, warm and well perfused.   Lab and Radiology Results No results found for this or any previous visit (from the past 72 hour(s)). No results found.  Care  Everywhere Result Report Lipid ProfileResulted: 11/20/2018 2:40 PM [facility] Component Name Value Ref Range  LDL Direct 102 <130 mg/dL  Total Cholesterol 155 25 - 199 MG/DL  Triglycerides 208 10 - 150 MG/DL  HDL Cholesterol 41 35 - 135 MG/DL  Total Chol / HDL Cholesterol 4.1 <4.5   Non-HDL Cholesterol 129  Comment:  TARGET: <(LDL-C TARGET + 30)MG/DL MG/DL  Coronary Heart Disease Risk Table   Comment: TOTAL CHOLESTEROL/HDL RATIO:CHD RISK CORONARY HEART DISEASE RISK TABLE            MEN   WOMEN 1/2 AVERAGE RISK  3.4   3.3 AVERAGE RISK    5.8   4.4 2 X AVERAGE RISK  9.6   7.1 3 X AVERAGE RISK  23.4   11.0  Use the calculated Patient Ratio and the CHD Risk Table to determine the patient's CHD Risk.  ATP III Classification (LDL): <100    mg/dl   Optimal 022-336  mg/dl   Near or Above Optimal 130-159  mg/dl   Borderline 122-449  mg/dl   High >753    mg/dl   Very High   Specimen Collected on  Blood specimen (specimen) 11/20/2018 9:27 AM   Care Everywhere Result Report Comprehensive Metabolic PanelResulted: 11/20/2018 2:40 PM [facility] Component Name Value Ref Range  Sodium 138 135 - 146 MMOL/L  Potassium 4.2  Comment: NO VISIBLE  HEMOLYSIS 3.5 - 5.3 MMOL/L  Chloride 102 98 - 110 MMOL/L  CO2 28 23 - 30 MMOL/L  BUN 15 8 - 24 MG/DL  Glucose 161124 (H) 70 - 99 MG/DL  Creatinine 0.960450.80138 0.5 - 1.5 MG/DL  Calcium 8.8 8.5 - 40.910.5 MG/DL  Total Protein 6.9 6 - 8.3 G/DL  Albumin  4.0 3.5 - 5 G/DL  Total Bilirubin 0.4 0.1 - 1.2 MG/DL  Alkaline Phosphatase 58 25 - 125 IU/L or U/L  AST (SGOT) 16 5 - 40 IU/L or U/L  ALT (SGPT) 20 5 - 50 IU/L or U/L  Anion Gap 8 4 - 14 MMOL/L  Est. GFR African American >=90  Comment: GFR estimated by CKD-EPI equations, reportable up to 90 ML/MIN/1.73 M*2 >=60 ML/MIN/1.73 M*2   Specimen Collected on  Blood specimen (specimen) 11/20/2018 9:27 AM      Assessment and Plan: 68 y.o. male with  Eye burning sensation could certainly be dry eye or allergic conjunctivitis.  Plan to use Systane artificial tears along with Zaditor eyedrops and Zyrtec.  Recheck if not improving.  Phlegm in back of throat: Very likely either reflux or postnasal drainage.  Recommend Protonix, Flonase nasal spray and Zyrtec.  If not improving let me know we will proceed with further evaluation.  Continue comanagement of diabetes with endocrinology.  We will abstract basic labs.  Influenza vaccine given today prior to discharge.   Recheck 6 months with Dr. Lyn HollingsheadAlexander.  I informed patient that I am transitioning to sports medicine only [place] [facility] [place] starting in November.  Happy to see patient for continued sports medicine needs.  Discussed need for new PCP.  Provided some recommendations.   PDMP not reviewed this encounter. Orders Placed This Encounter  Procedures  . Flu Vaccine QUAD High Dose(Fluad)  . Hemoglobin A1c    This external order was created through the Results Console.  . Basic metabolic panel    This external order was created through the Results Console.  . Lipid panel    This external order was created through the Results Console.  . Hepatic function panel    This external order was created through the Results Console.   Meds ordered this encounter  Medications  . fluticasone (FLONASE) 50 MCG/ACT nasal spray    Sig: One spray in each nostril twice a day, use left hand for right nostril, and right hand for left nostril.    Dispense:  48 g    Refill:  3     Historical information moved to improve visibility of documentation.  Past Medical History:  Diagnosis Date  . Arthritis   . Chest pain    a. Normal cors 2001. b. Neg stress test 2012, 05/2014.  . Diabetes mellitus without complication (HCC)   . Hyperlipidemia   . Hypertensive response to exercise   . Obesity   . Obstructive sleep apnea    Currently  untreated    Past Surgical History:  Procedure Laterality Date  . CERVICAL FUSION    . HERNIA REPAIR    . KNEE ARTHROSCOPY  1984   both knee   Social History   Tobacco Use  . Smoking status: Former Smoker    Packs/day: 1.50    Years: 30.00    Pack years: 45.00    Types: Cigarettes    Quit date: 1990    Years since quitting: 30.6  . Smokeless tobacco: Never Used  Substance Use Topics  . Alcohol use: No   family  history includes Cancer in his father; Diabetes in his maternal grandfather, maternal grandmother, mother, and sister; Heart attack in his maternal grandfather, maternal grandmother, mother, paternal grandfather, and paternal grandmother; Heart disease in his father and mother; Hyperlipidemia in his sister; Hypertension in his maternal grandfather, mother, sister, and sister; Stroke in his maternal grandmother and mother.  Medications: Current Outpatient Medications  Medication Sig Dispense Refill  . Omega-3 Fatty Acids (FISH OIL) 1000 MG CAPS Take by mouth.    . pantoprazole (PROTONIX) 40 MG tablet Take 40 mg by mouth daily.    Marland Kitchen aspirin 81 MG tablet Take 81 mg by mouth daily.    . Cholecalciferol (VITAMIN D3) 5000 units CAPS Take 1 capsule by mouth daily.    . Empagliflozin-Linagliptin 25-5 MG TABS Take 1 tablet by mouth daily. 30 tablet 11  . fluticasone (FLONASE) 50 MCG/ACT nasal spray One spray in each nostril twice a day, use left hand for right nostril, and right hand for left nostril. 48 g 3  . guaiFENesin-codeine 100-10 MG/5ML syrup Take 5 mLs by mouth every 6 (six) hours as needed for cough. Take mostly at bedtime 120 mL 0  . insulin glargine (LANTUS) 100 UNIT/ML injection Inject 40 Units into the skin 2 (two) times daily.    Marland Kitchen losartan (COZAAR) 25 MG tablet Take 1 tablet (25 mg total) by mouth daily. 90 tablet 1  . metFORMIN (GLUCOPHAGE-XR) 750 MG 24 hr tablet TAKE 1 TABLET BY MOUTH TWICE DAILY BEFORE A MEAL. FOLLOW UP VISIT WITH PCP 180 tablet 0  .  neomycin-polymyxin-hydrocortisone (CORTISPORIN) 3.5-10000-1 OTIC suspension Place 3 drops into the left ear 4 (four) times daily. 10 mL 0  . omeprazole (PRILOSEC) 40 MG capsule TAKE ONE CAPSULE BY MOUTH DAILY 90 capsule 1  . ONETOUCH VERIO test strip 1 each by Other route as needed.     . pioglitazone (ACTOS) 15 MG tablet Take 15 mg by mouth daily.     . propranolol ER (INDERAL LA) 60 MG 24 hr capsule TAKE 1 CAPSULE(60 MG) BY MOUTH DAILY 90 capsule 0  . rosuvastatin (CRESTOR) 5 MG tablet Take 1 tablet by mouth daily.    . tamsulosin (FLOMAX) 0.4 MG CAPS capsule Take 1 capsule (0.4 mg total) by mouth daily. 90 capsule 3  . traZODone (DESYREL) 50 MG tablet TAKE 1/2 TO 1 TABLET BY MOUTH AT BEDTIME AS NEEDED FOR SLEEP 90 tablet 3   No current facility-administered medications for this visit.    Allergies  Allergen Reactions  . Statins     Muscle cramping all over body. Lipitor, Crestor, and pravastatin.  Donna Bernard [Liraglutide] Other (See Comments)    Caused pancreatitis     Discussed warning signs or symptoms. Please see discharge instructions. Patient expresses understanding.

## 2019-01-31 NOTE — Patient Instructions (Addendum)
Thank you for coming in today.   [facility].   Use over-the-counter Zaditor eyedrops (Ketotifen) Use over-the-counter Zyrtec (cetirizine)  Use Systane artificial tears as needed  The phlegm could be post nasal drainage.  Also give Flonase nasal spray a try for a few weeks.   Continue protonix.   Keep me updated.    I will be moving to full time Sports Medicine in [place] starting on November 1st.  You will still be able to see me for your Sports Medicine or Orthopedic needs at [facility] in [place]. I will still be part of [place].    If you want to stay locally for your Sports Medicine issues Dr. Dianah Field here in [place] will be happy to see you.  Additionally Dr. Clearance Coots at [facility] will be happy to see you for sports medicine issues more locally.   For your primary care needs you are welcome to establish care with Dr. Emeterio Reeve.  We are working quickly to hire more physicians to cover the primary care needs however if you cannot get an appointment with Dr. Sheppard Coil in a timely manner [place] has locations and openings for primary care services nearby.   [facility] [address] . [employer] , [place]: [phone] . Behavioral Medicine: [phone] . Fax: [place] at [employer] [address] . [place], [place]: [phone] . Behavioral Medicine: [phone] . Fax: [phone] . Hours (M-F): 7am - Academic librarian At [facility]. [place], [place]: [phone] . Behavioral Medicine: [phone] . Fax: [phone] . Hours (M-F): 8am - Optician, dispensing at [employer] . [place], [place] Phone: [phone] . Behavioral Medicine: [phone] . Fax: [phone]

## 2019-02-19 DIAGNOSIS — G4733 Obstructive sleep apnea (adult) (pediatric): Secondary | ICD-10-CM | POA: Diagnosis not present

## 2019-02-22 ENCOUNTER — Other Ambulatory Visit: Payer: Self-pay | Admitting: Family Medicine

## 2019-02-22 DIAGNOSIS — N451 Epididymitis: Secondary | ICD-10-CM

## 2019-02-23 DIAGNOSIS — Z794 Long term (current) use of insulin: Secondary | ICD-10-CM | POA: Diagnosis not present

## 2019-02-23 DIAGNOSIS — E782 Mixed hyperlipidemia: Secondary | ICD-10-CM | POA: Diagnosis not present

## 2019-02-23 DIAGNOSIS — E1165 Type 2 diabetes mellitus with hyperglycemia: Secondary | ICD-10-CM | POA: Diagnosis not present

## 2019-02-23 LAB — HEMOGLOBIN A1C: Hemoglobin A1C: 9.5

## 2019-02-27 DIAGNOSIS — E782 Mixed hyperlipidemia: Secondary | ICD-10-CM | POA: Diagnosis not present

## 2019-02-27 DIAGNOSIS — Z794 Long term (current) use of insulin: Secondary | ICD-10-CM | POA: Diagnosis not present

## 2019-02-27 DIAGNOSIS — E559 Vitamin D deficiency, unspecified: Secondary | ICD-10-CM | POA: Diagnosis not present

## 2019-02-27 DIAGNOSIS — E1165 Type 2 diabetes mellitus with hyperglycemia: Secondary | ICD-10-CM | POA: Diagnosis not present

## 2019-02-27 DIAGNOSIS — I10 Essential (primary) hypertension: Secondary | ICD-10-CM | POA: Diagnosis not present

## 2019-02-27 DIAGNOSIS — E230 Hypopituitarism: Secondary | ICD-10-CM | POA: Diagnosis not present

## 2019-03-04 DIAGNOSIS — E785 Hyperlipidemia, unspecified: Secondary | ICD-10-CM | POA: Diagnosis not present

## 2019-03-06 DIAGNOSIS — G4733 Obstructive sleep apnea (adult) (pediatric): Secondary | ICD-10-CM | POA: Diagnosis not present

## 2019-03-12 ENCOUNTER — Other Ambulatory Visit (HOSPITAL_BASED_OUTPATIENT_CLINIC_OR_DEPARTMENT_OTHER): Payer: Self-pay

## 2019-03-12 DIAGNOSIS — E1142 Type 2 diabetes mellitus with diabetic polyneuropathy: Secondary | ICD-10-CM | POA: Diagnosis not present

## 2019-03-12 DIAGNOSIS — B351 Tinea unguium: Secondary | ICD-10-CM | POA: Diagnosis not present

## 2019-03-12 DIAGNOSIS — G4733 Obstructive sleep apnea (adult) (pediatric): Secondary | ICD-10-CM

## 2019-03-13 ENCOUNTER — Ambulatory Visit (INDEPENDENT_AMBULATORY_CARE_PROVIDER_SITE_OTHER): Payer: Medicare Other | Admitting: Family Medicine

## 2019-03-13 ENCOUNTER — Encounter: Payer: Self-pay | Admitting: Family Medicine

## 2019-03-13 ENCOUNTER — Other Ambulatory Visit: Payer: Self-pay

## 2019-03-13 ENCOUNTER — Encounter: Payer: Self-pay | Admitting: Osteopathic Medicine

## 2019-03-13 VITALS — BP 125/63 | HR 78 | Temp 98.4°F | Wt 286.0 lb

## 2019-03-13 DIAGNOSIS — H60391 Other infective otitis externa, right ear: Secondary | ICD-10-CM | POA: Diagnosis not present

## 2019-03-13 MED ORDER — NEOMYCIN-POLYMYXIN-HC 3.5-10000-1 OT SUSP: 3.0000 [drp] | Freq: Four times a day (QID) | OTIC | 0 refills | Status: DC

## 2019-03-13 MED ORDER — DOXYCYCLINE HYCLATE 100 MG PO TABS: 100.0000 mg | ORAL_TABLET | Freq: Two times a day (BID) | ORAL | 0 refills | Status: DC

## 2019-03-13 NOTE — Telephone Encounter (Signed)
Pt scheduled  

## 2019-03-13 NOTE — Patient Instructions (Addendum)
Thank you for coming in today. Take the oral doxycycline antibiotic.  Use the ear drops as well.  Let me know if not better.    Otitis Externa  Otitis externa is an infection of the outer ear canal. The outer ear canal is the area between the outside of the ear and the eardrum. Otitis externa is sometimes called swimmer's ear. What are the causes? Common causes of this condition include:  Swimming in dirty water.  Moisture in the ear.  An injury to the inside of the ear.  An object stuck in the ear.  A cut or scrape on the outside of the ear. What increases the risk? You are more likely to get this condition if you go swimming often. What are the signs or symptoms?  Itching in the ear. This is often the first symptom.  Swelling of the ear.  Redness in the ear.  Ear pain. The pain may get worse when you pull on your ear.  Pus coming from the ear. How is this treated? This condition may be treated with:  Antibiotic ear drops. These are often given for 10-14 days.  Medicines to reduce itching and swelling. Follow these instructions at home:  If you were given antibiotic ear drops, use them as told by your doctor. Do not stop using them even if your condition gets better.  Take over-the-counter and prescription medicines only as told by your doctor.  Avoid getting water in your ears as told by your doctor. You may be told to avoid swimming or water sports for a few days.  Keep all follow-up visits as told by your doctor. This is important. How is this prevented?  Keep your ears dry. Use the corner of a towel to dry your ears after you swim or bathe.  Try not to scratch or put things in your ear. Doing these things makes it easier for germs to grow in your ear.  Avoid swimming in lakes, dirty water, or pools that may not have the right amount of a chemical called chlorine. Contact a doctor if:  You have a fever.  Your ear is still red, swollen, or painful after  3 days.  You still have pus coming from your ear after 3 days.  Your redness, swelling, or pain gets worse.  You have a really bad headache.  You have redness, swelling, pain, or tenderness behind your ear. Summary  Otitis externa is an infection of the outer ear canal.  Symptoms include pain, redness, and swelling of the ear.  If you were given antibiotic ear drops, use them as told by your doctor. Do not stop using them even if your condition gets better.  Try not to scratch or put things in your ear. This information is not intended to replace advice given to you by your health care provider. Make sure you discuss any questions you have with your health care provider. Document Released: 11/03/2007 Document Revised: 10/21/2017 Document Reviewed: 10/21/2017 Elsevier Patient Education  2020 [employer].

## 2019-03-13 NOTE — Progress Notes (Signed)
Gregory Eusebio Me [name] is a 68 y.o. male who presents to [facility] [place] [facility] today for right ear pain.  Right ear pain starting Sunday.  Patient picked what he thought was a blackhead in his external most ear canal.  He developed pain and swelling on Sunday.  He notes worsening pain and swelling over the last 2 days.  He denies fevers or chills.  He notes a little bit of pain worse with chewing.  He is not tried much treatment yet.  He feels well otherwise.    ROS as above:  Exam:  BP 125/63   Pulse 78   Temp 98.4 F (36.9 C) (Oral)   Wt 286 lb (129.7 kg)   BMI 41.04 kg/m  Wt Readings from Last 5 Encounters:  03/13/19 286 lb (129.7 kg)  01/31/19 286 lb (129.7 kg)  07/31/18 284 lb (128.8 kg)  07/11/18 288 lb (130.6 kg)  06/28/18 287 lb (130.2 kg)    Gen: Well NAD HEENT: EOMI,  MMM right ear canal erythematous and swollen.  No discharge.  Tympanic membrane is normal-appearing.  Pain worse with ear motion.  Mastoid nontender to palpation.  Left ear and ear canal are normal-appearing Lungs: Normal work of breathing. CTABL Heart: RRR no MRG Abd: NABS, Soft. Nondistended, Nontender Exts: Brisk capillary refill, warm and well perfused.   Lab and Radiology Results No results found for this or any previous visit (from the past 72 hour(s)). No results found.    Assessment and Plan: 68 y.o. male with right otitis externa/cellulitis.  Treatment with doxycycline and Cortisporin eardrops.  Recheck if not improving.  Precautions reviewed.  Return as needed.  PDMP not reviewed this encounter. Orders Placed This Encounter  Procedures  . Hemoglobin A1c    This external order was created through the Results Console.   Meds ordered this encounter  Medications  . doxycycline (VIBRA-TABS) 100 MG tablet    Sig: Take 1 tablet (100 mg total) by mouth 2 (two) times daily.   Dispense:  14 tablet    Refill:  0  . neomycin-polymyxin-hydrocortisone (CORTISPORIN) 3.5-10000-1 OTIC suspension    Sig: Place 3 drops into the left ear 4 (four) times daily.    Dispense:  10 mL    Refill:  0     Historical information moved to improve visibility of documentation.  Past Medical History:  Diagnosis Date  . Arthritis   . Chest pain    a. Normal cors 2001. b. Neg stress test 2012, 05/2014.  . Diabetes mellitus without complication ([place])   . Hyperlipidemia   . Hypertensive response to exercise   . Obesity   . Obstructive sleep apnea    Currently untreated    Past Surgical History:  Procedure Laterality Date  . CERVICAL FUSION    . HERNIA REPAIR    . KNEE ARTHROSCOPY  1984   both knee   Social History   Tobacco Use  . Smoking status: Former Smoker    Packs/day: 1.50    Years: 30.00    Pack years: 45.00    Types: Cigarettes    Quit date: 1990    Years since quitting: 30.8  . Smokeless tobacco: Never Used  Substance Use Topics  . Alcohol use: No   family history includes Cancer in his father; Diabetes in his maternal grandfather, maternal grandmother, mother, and sister; Heart attack in his maternal grandfather, maternal grandmother, mother, paternal grandfather, and paternal  grandmother; Heart disease in his father and mother; Hyperlipidemia in his sister; Hypertension in his maternal grandfather, mother, sister, and sister; Stroke in his maternal grandmother and mother.  Medications: Current Outpatient Medications  Medication Sig Dispense Refill  . aspirin 81 MG tablet Take 81 mg by mouth daily.    . Cholecalciferol (VITAMIN D3) 5000 units CAPS Take 1 capsule by mouth daily.    . Empagliflozin-Linagliptin 25-5 MG TABS Take 1 tablet by mouth daily. 30 tablet 11  . fluticasone (FLONASE) 50 MCG/ACT nasal spray One spray in each nostril twice a day, use left hand for right nostril, and right hand for left nostril. 48 g 3  . guaiFENesin-codeine 100-10 MG/5ML  syrup Take 5 mLs by mouth every 6 (six) hours as needed for cough. Take mostly at bedtime 120 mL 0  . insulin glargine (LANTUS) 100 UNIT/ML injection Inject 40 Units into the skin 2 (two) times daily.    Marland Kitchen losartan (COZAAR) 25 MG tablet Take 1 tablet (25 mg total) by mouth daily. 90 tablet 1  . metFORMIN (GLUCOPHAGE-XR) 750 MG 24 hr tablet TAKE 1 TABLET BY MOUTH TWICE DAILY BEFORE A MEAL. FOLLOW UP VISIT WITH PCP 180 tablet 0  . neomycin-polymyxin-hydrocortisone (CORTISPORIN) 3.5-10000-1 OTIC suspension Place 3 drops into the left ear 4 (four) times daily. 10 mL 0  . Omega-3 Fatty Acids (FISH OIL) 1000 MG CAPS Take by mouth.    Marland Kitchen omeprazole (PRILOSEC) 40 MG capsule TAKE ONE CAPSULE BY MOUTH DAILY 90 capsule 1  . ONETOUCH VERIO test strip 1 each by Other route as needed.     . pantoprazole (PROTONIX) 40 MG tablet Take 40 mg by mouth daily.    . pioglitazone (ACTOS) 15 MG tablet Take 15 mg by mouth daily.     . propranolol ER (INDERAL LA) 60 MG 24 hr capsule TAKE ONE CAPSULE BY MOUTH DAILY 90 capsule 0  . rosuvastatin (CRESTOR) 5 MG tablet Take 1 tablet by mouth daily.    . tamsulosin (FLOMAX) 0.4 MG CAPS capsule Take 1 capsule (0.4 mg total) by mouth daily. 90 capsule 3  . traZODone (DESYREL) 50 MG tablet TAKE 1/2 TO 1 TABLET BY MOUTH AT BEDTIME AS NEEDED FOR SLEEP 90 tablet 3  . doxycycline (VIBRA-TABS) 100 MG tablet Take 1 tablet (100 mg total) by mouth 2 (two) times daily. 14 tablet 0   No current facility-administered medications for this visit.    Allergies  Allergen Reactions  . Statins     Muscle cramping all over body. Lipitor, Crestor, and pravastatin.  Verdis Prime [Liraglutide] Other (See Comments)    Caused pancreatitis     Discussed warning signs or symptoms. Please see discharge instructions. Patient expresses understanding.

## 2019-03-19 ENCOUNTER — Other Ambulatory Visit (HOSPITAL_COMMUNITY)
Admission: RE | Admit: 2019-03-19 | Discharge: 2019-03-19 | Disposition: A | Discharge status: Home / Self Care | Payer: Medicare Other | Source: Ambulatory Visit | Attending: Internal Medicine | Admitting: Internal Medicine

## 2019-03-19 DIAGNOSIS — Z01812 Encounter for preprocedural laboratory examination: Secondary | ICD-10-CM | POA: Diagnosis not present

## 2019-03-19 DIAGNOSIS — Z20828 Contact with and (suspected) exposure to other viral communicable diseases: Secondary | ICD-10-CM | POA: Diagnosis not present

## 2019-03-20 LAB — NOVEL CORONAVIRUS, NAA (HOSP ORDER, SEND-OUT TO REF LAB; TAT 18-24 HRS): SARS-CoV-2, NAA: NOT DETECTED

## 2019-03-22 ENCOUNTER — Ambulatory Visit (HOSPITAL_BASED_OUTPATIENT_CLINIC_OR_DEPARTMENT_OTHER): Payer: Medicare Other | Attending: Internal Medicine | Admitting: Internal Medicine

## 2019-03-22 ENCOUNTER — Other Ambulatory Visit: Payer: Self-pay

## 2019-03-22 DIAGNOSIS — R0902 Hypoxemia: Secondary | ICD-10-CM | POA: Diagnosis not present

## 2019-03-22 DIAGNOSIS — Z79899 Other long term (current) drug therapy: Secondary | ICD-10-CM | POA: Insufficient documentation

## 2019-03-22 DIAGNOSIS — G4733 Obstructive sleep apnea (adult) (pediatric): Secondary | ICD-10-CM

## 2019-03-26 ENCOUNTER — Encounter: Payer: Self-pay | Admitting: Family Medicine

## 2019-03-26 ENCOUNTER — Ambulatory Visit (INDEPENDENT_AMBULATORY_CARE_PROVIDER_SITE_OTHER): Payer: Medicare Other | Admitting: Family Medicine

## 2019-03-26 ENCOUNTER — Other Ambulatory Visit: Payer: Self-pay

## 2019-03-26 VITALS — BP 137/83 | HR 75 | Temp 97.6°F | Wt 288.0 lb

## 2019-03-26 DIAGNOSIS — G4733 Obstructive sleep apnea (adult) (pediatric): Secondary | ICD-10-CM | POA: Diagnosis not present

## 2019-03-26 DIAGNOSIS — L03119 Cellulitis of unspecified part of limb: Secondary | ICD-10-CM

## 2019-03-26 MED ORDER — DOXYCYCLINE HYCLATE 100 MG PO TABS: 100.0000 mg | ORAL_TABLET | Freq: Two times a day (BID) | ORAL | 0 refills | Status: DC

## 2019-03-26 NOTE — Procedures (Signed)
   NAME: Gregory West. DATE OF BIRTH:  April 16, 1951 MEDICAL RECORD NUMBER [id number]  LOCATION: [facility]  PHYSICIAN: Marius Ditch  DATE OF STUDY: 03/22/2019  SLEEP STUDY TYPE: Positive Airway Pressure Titration               REFERRING PHYSICIAN: Marius Ditch, MD  INDICATION FOR STUDY: History of moderate to severe OSA. Unable to tolerate auto adjusting CPAP. Titration need in case BiPAP is needed. Possible obesity hypoventilation syndrome.   EPWORTH SLEEPINESS SCORE:  12 HEIGHT: 5\' 9"  (175.3 cm)  WEIGHT: 285 lb (129.3 kg)    Body mass index is 42.09 kg/m.  NECK SIZE: 18.5 in.  MEDICATIONS  Patient self administered medications include: N/A. Medications administered during study include Sleep medicine administered - TRAZODONE 50 MG at 09:50:27 PM.   SLEEP STUDY TECHNIQUE  The patient underwent an attended overnight polysomnography titration to assess the effects of cpap therapy. The following variables were monitored: EEG (C4-A1, C3-A2, O1-A2, O2-A1, F3-M2, F4-M1), EOG, submental and leg EMG, ECG, oxyhemoglobin saturation by pulse oximetry, thoracic and abdominal respiratory effort belts, nasal/oral airflow by pressure sensor, body position sensor and snoring sensor. CPAP pressure was titrated to eliminate apneas, hypopneas and oxygen desaturation.   TECHNICAL COMMENTS  Comments added by Technician: PT HAD ONE RESTROOM VISIT Comments added by Scorer: N/A  SLEEP ARCHITECTURE  The study was initiated at 10:04:59 PM and terminated at 4:42:20 AM. Total recorded time was 397.3 minutes. EEG confirmed total sleep time was 341 minutes yielding a sleep efficiency of 85.8%%. Sleep onset after lights out was 12.1 minutes with a REM latency of 48.0 minutes. The patient spent 5.3%% of the night in stage N1 sleep, 62.8%% in stage N2 sleep, 0.6%% in stage N3 and 31.4% in REM. The Arousal Index was 15.7/hour.   RESPIRATORY PARAMETERS  The overall AHI was 10.6  per hour, and the RDI was 11.8 events/hour with a central apnea index of 0.2 per hour. The most appropriate setting of BiPAP was IPAP/EPAP 24/20 cm H2O. At this setting, the sleep efficiency was 98 % and the patient was supine for 100%. The AHI was 0.0 events per hour, and the RDI was 0.0 events/hour (with 0.2 central events) and the arousal index was 4.4 per hour.The oxygen nadir was 88.0% during this period. Similar numbers were obtained for BiPAP pressures of 23/19.  LEG MOVEMENT DATA  The total leg movements were 0 with a resulting leg movement index of 0.0. Associated arousal with leg movement index was 0.0.   CARDIAC DATA  The underlying cardiac rhythm was most consistent with sinus rhythm. Mean heart rate during sleep was 69.4 bpm. Additional rhythm abnormalities include None.   IMPRESSIONS  Moderate to severe OSA  Possible obesity hypoventilation syndrome Sleep disordered breathing and hypoxemia solved with BiPAP 23/19 and 24/20  DIAGNOSIS  Obstructive Sleep Apnea (327.23 [G47.33 ICD-10])  RECOMMENDATIONS  Trial of BiPAP therapy on 23/19 or 24/20 cm H2O with a Medium size Resmed Full Face Mask AirFit F20 mask and heated humidification.   Marius Ditch Sleep specialist, [place] Board of Internal Medicine  ELECTRONICALLY SIGNED ON:  03/26/2019, 8:31 PM [place] PH: [phone]   FX: [phone] [place]

## 2019-03-26 NOTE — Patient Instructions (Signed)
Thank you for coming in today. Keep the dressing on for a few days.  Take doxycycline twice daily for 1 week.  Recheck if not improving.

## 2019-03-26 NOTE — Progress Notes (Signed)
Gregory Governor Rooks [name] is a 68 y.o. male who presents to [facility] today for leg wounds.    Patient had adhesive leads on his bilateral anterior legs as part of a sleep study that was performed in the evening of Thursday, October 22 to the morning or Friday, October 23.  He notes that when he got home the skin was very irritated and become red and is draining a bit.  He notes is mildly tender.  He feels well with no fevers chills nausea vomiting or diarrhea.  He is tried treating with Neosporin antibiotic ointment which helped a little.   ROS as above:  Exam:  BP 137/83   Pulse 75   Temp 97.6 F (36.4 C) (Oral)   Wt 288 lb (130.6 kg)   BMI 42.53 kg/m  Wt Readings from Last 5 Encounters:  03/26/19 288 lb (130.6 kg)  03/22/19 285 lb (129.3 kg)  03/13/19 286 lb (129.7 kg)  01/31/19 286 lb (129.7 kg)  07/31/18 284 lb (128.8 kg)    Gen: Well NAD HEENT: EOMI,  MMM Lungs: Normal work of breathing. CTABL Heart: RRR no MRG Abd: NABS, Soft. Nondistended, Nontender Exts: Brisk capillary refill, warm and well perfused.  Skin: Small square shaped patch of skin breakdown superficially.  Some drainage present.  Mild skin erythema at the borders.  Mildly tender.  No fluctuance.  Patient provided pictures.         Assessment and Plan: 68 y.o. male with cellulitis secondary to skin adhesive and breakdown.    Skin was treated with triple and buttock ointment covered with Tegaderm.  Treat with oral antibiotic doxycycline.  If not improving return to clinic for recheck reevaluation.  PDMP not reviewed this encounter. No orders of the defined types were placed in this encounter.  Meds ordered this encounter  Medications  . doxycycline (VIBRA-TABS) 100 MG tablet    Sig: Take 1 tablet (100 mg total) by mouth 2 (two) times daily.    Dispense:  14 tablet   Refill:  0     Historical information moved to improve visibility of documentation.  Past Medical History:  Diagnosis Date  . Arthritis   . Chest pain    a. Normal cors 2001. b. Neg stress test 2012, 05/2014.  . Diabetes mellitus without complication ([facility])   . Hyperlipidemia   . Hypertensive response to exercise   . Obesity   . Obstructive sleep apnea    Currently untreated    Past Surgical History:  Procedure Laterality Date  . CERVICAL FUSION    . HERNIA REPAIR    . KNEE ARTHROSCOPY  1984   both knee   Social History   Tobacco Use  . Smoking status: Former Smoker    Packs/day: 1.50    Years: 30.00    Pack years: 45.00    Types: Cigarettes    Quit date: 1990    Years since quitting: 30.8  . Smokeless tobacco: Never Used  Substance Use Topics  . Alcohol use: No   family history includes Cancer in his father; Diabetes in his maternal grandfather, maternal grandmother, mother, and sister; Heart attack in his maternal grandfather, maternal grandmother, mother, paternal grandfather, and paternal grandmother; Heart disease in his father and mother; Hyperlipidemia in his sister; Hypertension in his maternal grandfather, mother, sister, and sister; Stroke in his maternal grandmother and mother.  Medications: Current Outpatient Medications  Medication Sig Dispense Refill  .  aspirin 81 MG tablet Take 81 mg by mouth daily.    . Cholecalciferol (VITAMIN D3) 5000 units CAPS Take 1 capsule by mouth daily.    Marland Kitchen doxycycline (VIBRA-TABS) 100 MG tablet Take 1 tablet (100 mg total) by mouth 2 (two) times daily. 14 tablet 0  . Empagliflozin-Linagliptin 25-5 MG TABS Take 1 tablet by mouth daily. 30 tablet 11  . fluticasone (FLONASE) 50 MCG/ACT nasal spray One spray in each nostril twice a day, use left hand for right nostril, and right hand for left nostril. 48 g 3  . guaiFENesin-codeine 100-10 MG/5ML syrup Take 5 mLs by mouth every 6 (six) hours as needed for cough. Take mostly at bedtime  120 mL 0  . insulin glargine (LANTUS) 100 UNIT/ML injection Inject 40 Units into the skin 2 (two) times daily.    Marland Kitchen losartan (COZAAR) 25 MG tablet Take 1 tablet (25 mg total) by mouth daily. 90 tablet 1  . metFORMIN (GLUCOPHAGE-XR) 750 MG 24 hr tablet TAKE 1 TABLET BY MOUTH TWICE DAILY BEFORE A MEAL. FOLLOW UP VISIT WITH PCP 180 tablet 0  . neomycin-polymyxin-hydrocortisone (CORTISPORIN) 3.5-10000-1 OTIC suspension Place 3 drops into the left ear 4 (four) times daily. 10 mL 0  . Omega-3 Fatty Acids (FISH OIL) 1000 MG CAPS Take by mouth.    Marland Kitchen omeprazole (PRILOSEC) 40 MG capsule TAKE ONE CAPSULE BY MOUTH DAILY 90 capsule 1  . ONETOUCH VERIO test strip 1 each by Other route as needed.     . pantoprazole (PROTONIX) 40 MG tablet Take 40 mg by mouth daily.    . pioglitazone (ACTOS) 15 MG tablet Take 15 mg by mouth daily.     . propranolol ER (INDERAL LA) 60 MG 24 hr capsule TAKE ONE CAPSULE BY MOUTH DAILY 90 capsule 0  . rosuvastatin (CRESTOR) 5 MG tablet Take 1 tablet by mouth daily.    . tamsulosin (FLOMAX) 0.4 MG CAPS capsule Take 1 capsule (0.4 mg total) by mouth daily. 90 capsule 3  . traZODone (DESYREL) 50 MG tablet TAKE 1/2 TO 1 TABLET BY MOUTH AT BEDTIME AS NEEDED FOR SLEEP 90 tablet 3   No current facility-administered medications for this visit.    Allergies  Allergen Reactions  . Statins     Muscle cramping all over body. Lipitor, Crestor, and pravastatin.  [place] [Liraglutide] Other (See Comments)    Caused pancreatitis     Discussed warning signs or symptoms. Please see discharge instructions. Patient expresses understanding.

## 2019-03-29 ENCOUNTER — Telehealth: Payer: Self-pay | Admitting: Family Medicine

## 2019-03-29 MED ORDER — MINOCYCLINE HCL 100 MG PO CAPS: 100.0000 mg | ORAL_CAPSULE | Freq: Two times a day (BID) | ORAL | 1 refills | Status: DC

## 2019-03-29 NOTE — Telephone Encounter (Signed)
Called patient, states he was able to pick up Doxy and is taking it without issues. Advised him to continue with Doxycycline

## 2019-03-29 NOTE — Telephone Encounter (Signed)
Received fax from [facility] stating that doxycycline is not covered under plan and the fax including what looks to be somewhat garbled list of other potential medications. It looks like minocycline is preferred.  We will switch to minocycline. New rx sent

## 2019-04-11 NOTE — Progress Notes (Signed)
Cardiology Office Note:    Date:  04/13/2019   ID:  Gregory Ready [name], DOB 26-Sep-1950, MRN [id number]  PCP:  Gregory Bong, MD  Cardiologist:  No primary care provider on file.   Referring MD: Gregory Bong, MD   Chief Complaint  Patient presents with  . Chest Pain    History of Present Illness:    Gregory MCFARLAND [name] is a 68 y.o. male with a hx of  DM II, OSA, HLD, obesity, prior hypertensive response to exercise, and chest pain with prior negative cardiac workup who presents for f/u due to concerns about left parasternal chest pain.  He is doing well.  He is not having exertional dyspnea or angina.  He still has vague precordial chest discomfort both right and left.  Discomfort can be present 5 hours.  It is more noticeable when he is sitting and not active.  He has been relatively sedentary because of the COVID-19 pandemic.  There is no associated complaints other than perhaps a slightly faster heart rate.   Past Medical History:  Diagnosis Date  . Arthritis   . Chest pain    a. Normal cors 2001. b. Neg stress test 2012, 05/2014.  . Diabetes mellitus without complication (HCC)   . Hyperlipidemia   . Hypertensive response to exercise   . Obesity   . Obstructive sleep apnea    Currently untreated     Past Surgical History:  Procedure Laterality Date  . CERVICAL FUSION    . HERNIA REPAIR    . KNEE ARTHROSCOPY  1984   both knee    Current Medications: Current Meds  Medication Sig  . aspirin 81 MG tablet Take 81 mg by mouth daily.  . Cholecalciferol (VITAMIN D3) 5000 units CAPS Take 1 capsule by mouth daily.  . Empagliflozin-Linagliptin 25-5 MG TABS Take 1 tablet by mouth daily.  . fluticasone (FLONASE) 50 MCG/ACT nasal spray One spray in each nostril twice a day, use left hand for right nostril, and right hand for left nostril.  . insulin glargine (LANTUS) 100 UNIT/ML injection Inject 40 Units into the skin 2 (two) times daily.  Marland Kitchen losartan (COZAAR) 25 MG  tablet Take 1 tablet (25 mg total) by mouth daily.  . metFORMIN (GLUCOPHAGE-XR) 750 MG 24 hr tablet TAKE 1 TABLET BY MOUTH TWICE DAILY BEFORE A MEAL. FOLLOW UP VISIT WITH PCP  . Omega-3 Fatty Acids (FISH OIL) 1000 MG CAPS Take by mouth.  Marland Kitchen omeprazole (PRILOSEC) 40 MG capsule TAKE ONE CAPSULE BY MOUTH DAILY  . ONETOUCH VERIO test strip 1 each by Other route as needed.   . pioglitazone (ACTOS) 15 MG tablet Take 15 mg by mouth daily.   . propranolol ER (INDERAL LA) 60 MG 24 hr capsule TAKE ONE CAPSULE BY MOUTH DAILY  . rosuvastatin (CRESTOR) 5 MG tablet Take 1 tablet by mouth daily.  . tamsulosin (FLOMAX) 0.4 MG CAPS capsule Take 1 capsule (0.4 mg total) by mouth daily.  . traZODone (DESYREL) 50 MG tablet TAKE 1/2 TO 1 TABLET BY MOUTH AT BEDTIME AS NEEDED FOR SLEEP     Allergies:   Statins and Victoza [liraglutide]   Social History   Socioeconomic History  . Marital status: Married    Spouse name: Not on file  . Number of children: Not on file  . Years of education: Not on file  . Highest education level: Not on file  Occupational History  . Not on file  Social Needs  .  Financial resource strain: Not on file  . Food insecurity    Worry: Not on file    Inability: Not on file  . Transportation needs    Medical: Not on file    Non-medical: Not on file  Tobacco Use  . Smoking status: Former Smoker    Packs/day: 1.50    Years: 30.00    Pack years: 45.00    Types: Cigarettes    Quit date: 1990    Years since quitting: 30.8  . Smokeless tobacco: Never Used  Substance and Sexual Activity  . Alcohol use: No  . Drug use: No  . Sexual activity: Not on file  Lifestyle  . Physical activity    Days per week: Not on file    Minutes per session: Not on file  . Stress: Not on file  Relationships  . Social Musicianconnections    Talks on phone: Not on file    Gets together: Not on file    Attends religious service: Not on file    Active member of club or organization: Not on file     Attends meetings of clubs or organizations: Not on file    Relationship status: Not on file  Other Topics Concern  . Not on file  Social History Narrative  . Not on file     Family History: The patient's family history includes Cancer in his father; Diabetes in his maternal grandfather, maternal grandmother, mother, and sister; Heart attack in his maternal grandfather, maternal grandmother, mother, paternal grandfather, and paternal grandmother; Heart disease in his father and mother; Hyperlipidemia in his sister; Hypertension in his maternal grandfather, mother, sister, and sister; Stroke in his maternal grandmother and mother.  ROS:   Please see the history of present illness.    Not exercising.  Not eating properly.  Gaining weight.  Not wearing CPAP as recommended.  He is getting new sleep equipment.  All other systems reviewed and are negative.  EKGs/Labs/Other Studies Reviewed:    The following studies were reviewed today: Coronary CT with morphology February 2020:  IMPRESSION: 1. Calcium score 0  2.  Normal co dominant coronary arteries  3.  Aortic root 3.8 cm  Gregory HawsPeter West  EKG:  EKG normal EKG performed 04/13/2019  Recent Labs: 11/20/2018: ALT 20; BUN 15; Creatinine 0.8; Potassium 4.2; Sodium 138  Recent Lipid Panel    Component Value Date/Time   CHOL 170 11/20/2018   TRIG 105 11/20/2018   HDL 41 11/20/2018   VLDL 22 04/01/2015   LDLCALC 102 11/20/2018    Physical Exam:    VS:  BP 124/78   Pulse 73   Ht 5\' 9"  (1.753 m)   Wt 284 lb 1.9 oz (128.9 kg)   SpO2 95%   BMI 41.96 kg/m     Wt Readings from Last 3 Encounters:  04/13/19 284 lb 1.9 oz (128.9 kg)  03/26/19 288 lb (130.6 kg)  03/22/19 285 lb (129.3 kg)     GEN: Obese. No acute distress HEENT: Normal NECK: No JVD. LYMPHATICS: No lymphadenopathy CARDIAC:  RRR without murmur, gallop, or edema. VASCULAR:  Normal Pulses. No bruits. RESPIRATORY:  Clear to auscultation without rales, wheezing or  rhonchi  ABDOMEN: Soft, non-tender, non-distended, No pulsatile mass, MUSCULOSKELETAL: No deformity  SKIN: Warm and dry NEUROLOGIC:  Alert and oriented x 3 PSYCHIATRIC:  Normal affect   ASSESSMENT:    1. Chest pain, unspecified type   2. Hypertension associated with diabetes (HCC)   3. Mixed hyperlipidemia  4. Type II diabetes mellitus with complication ([place])   5. Obstructive sleep apnea   6. Educated about COVID-19 virus infection    PLAN:    In order of problems listed above:  1. Without documented ischemia and no epicardial coronary disease.  Worse case scenario is microvascular dysfunction.  He has multiple risk factors.  Plan to continue angiotensin-converting enzyme system inhibition, consider adding beta-blocker therapy.  Aggressive risk factor control including weight, diabetes, and lipids. 2. Excellent control 3. Will refer to the lipid clinic.  He has been unable to tolerate statins.  He has "vascular disease" in terms of poorly controlled diabetes.  We need LDL less than 70.  Question bempedoic acid and ezetimibe. 4. Aerobic activity and alterations in diet are strongly encouraged. 5. Encouraged to resume CPAP. 6. The 3W's are discussed and explained to prevent Covid infection.  Overall education and awareness concerning primary risk prevention was discussed in detail: LDL less than 70, hemoglobin A1c less than 7, blood pressure target less than 130/80 mmHg, >150 minutes of moderate aerobic activity per week, avoidance of smoking, weight control (via diet and exercise), and continued surveillance/management of/for obstructive sleep apnea.  Referral to lipid clinic.  Medication Adjustments/Labs and Tests Ordered: Current medicines are reviewed at length with the patient today.  Concerns regarding medicines are outlined above.  No orders of the defined types were placed in this encounter.  No orders of the defined types were placed in this encounter.   There are no  Patient Instructions on file for this visit.   Signed, Sinclair Grooms, MD  04/13/2019 8:19 AM    [place]

## 2019-04-13 ENCOUNTER — Other Ambulatory Visit: Payer: Self-pay

## 2019-04-13 ENCOUNTER — Ambulatory Visit (INDEPENDENT_AMBULATORY_CARE_PROVIDER_SITE_OTHER): Payer: Medicare Other | Admitting: Interventional Cardiology

## 2019-04-13 ENCOUNTER — Encounter: Payer: Self-pay | Admitting: Interventional Cardiology

## 2019-04-13 VITALS — BP 124/78 | HR 73 | Ht 69.0 in | Wt 284.1 lb

## 2019-04-13 DIAGNOSIS — I10 Essential (primary) hypertension: Secondary | ICD-10-CM | POA: Diagnosis not present

## 2019-04-13 DIAGNOSIS — R079 Chest pain, unspecified: Secondary | ICD-10-CM | POA: Diagnosis not present

## 2019-04-13 DIAGNOSIS — G4733 Obstructive sleep apnea (adult) (pediatric): Secondary | ICD-10-CM | POA: Diagnosis not present

## 2019-04-13 DIAGNOSIS — Z7189 Other specified counseling: Secondary | ICD-10-CM | POA: Diagnosis not present

## 2019-04-13 DIAGNOSIS — E118 Type 2 diabetes mellitus with unspecified complications: Secondary | ICD-10-CM | POA: Diagnosis not present

## 2019-04-13 DIAGNOSIS — E782 Mixed hyperlipidemia: Secondary | ICD-10-CM

## 2019-04-13 DIAGNOSIS — E1159 Type 2 diabetes mellitus with other circulatory complications: Secondary | ICD-10-CM

## 2019-04-13 DIAGNOSIS — I152 Hypertension secondary to endocrine disorders: Secondary | ICD-10-CM

## 2019-04-13 NOTE — Patient Instructions (Signed)
Medication Instructions:  Your physician recommends that you continue on your current medications as directed. Please refer to the Current Medication list given to you today.  *If you need a refill on your cardiac medications before your next appointment, please call your pharmacy*  Lab Work: None If you have labs (blood work) drawn today and your tests are completely normal, you will receive your results only by: . MyChart Message (if you have MyChart) OR . A paper copy in the mail If you have any lab test that is abnormal or we need to change your treatment, we will call you to review the results.  Testing/Procedures: None  Follow-Up: At [facility], you and your health needs are our priority.  As part of our continuing mission to provide you with exceptional heart care, we have created designated Provider Care Teams.  These Care Teams include your primary Cardiologist (physician) and Advanced Practice Providers (APPs -  Physician Assistants and Nurse Practitioners) who all work together to provide you with the care you need, when you need it.  Your next appointment:   12 month(s)  The format for your next appointment:   In Person  Provider:   You may see Dr. [name] or one of the following Advanced Practice Providers on your designated Care Team:    Lori Gerhardt, NP  Laura Ingold, NP  Jill McDaniel, NP   Other Instructions   

## 2019-05-02 ENCOUNTER — Telehealth: Payer: Self-pay | Admitting: Pharmacist

## 2019-05-02 ENCOUNTER — Other Ambulatory Visit: Payer: Self-pay

## 2019-05-02 ENCOUNTER — Encounter: Payer: Self-pay | Admitting: Pharmacist

## 2019-05-02 ENCOUNTER — Ambulatory Visit (INDEPENDENT_AMBULATORY_CARE_PROVIDER_SITE_OTHER): Payer: Medicare Other | Admitting: Pharmacist

## 2019-05-02 DIAGNOSIS — E782 Mixed hyperlipidemia: Secondary | ICD-10-CM

## 2019-05-02 MED ORDER — EZETIMIBE 10 MG PO TABS: 10.0000 mg | ORAL_TABLET | Freq: Every day | ORAL | 3 refills | Status: DC

## 2019-05-02 NOTE — Patient Instructions (Signed)
It was a pleasure to meet you today!  Continue to work on your diet! Congrats on making the decision to cut out soda and juice! This will make a huge impact! Try to eliminate butter/margarine from your diet. Limit cheeses and focus on a diet high is fiber, vegetables and lean meats and good fats like nuts and avocados. Keep exercising daily! Aim for a goal of >173min of walking per week  We will submit paperwork to get the cost down for Zetia.  Continue rosuvastatin 5mg  daily and start taking Zetia 10mg  daily.  Call us with any questions @ [phone]

## 2019-05-02 NOTE — Telephone Encounter (Signed)
zetia tier exception approved. Will need to repeat this tier exception with patients new plan come 2021 New plan [employer] Member # [id number] [place] [id number] PCN [place] Patient made aware. Labs scheduled for 2/3

## 2019-05-02 NOTE — Progress Notes (Signed)
Patient ID: Gregory BIELLO [name]                 DOB: Oct 12, 1950                    MRN: [id number]     HPI: Gregory West [name] is a 68 y.o. male patient referred to lipid clinic by Dr. Tamala West. PMH is significant for DM II, OSA, HLD, obesity, prior hypertensive response to exercise, and chest pain with prior negative cardiac workup. Of note, patient had a coronary calcium score done in Feb 2020 which was found to be 0. Last A1C was 9.5. He has been unable to tolerate higher doses of statins in the past.  Patient presents today for initially appointment with lipid clinic. Patient is currently on rosuvastatin 5mg  daily, tolerating well. He however has not tolerated higher doses nor did he tolerate atorvastatin or pravastatin due to muscle cramps. He uses both the [place] and he has a medicare part D plan. States that within the last 30 days he and his wife have started making dietary changes. He has completely cut out soda and juice. He has been going to [employer] daily.  Current Medications: rosuvastatin 5mg  daily, fish oil through [place] Intolerances: livalo 2mg  daily (cost) atorvastatin, rosuvastatin, pravastatin (muscle cramps) Risk Factors: DM, elevated non HDL, HTN LDL goal: <70 per Dr. Tamala West and AACE guidelines (DM w/ at least 1 risk factor)  Diet: recently cut out soda and juice, cut back to 2 cups of coffee per day w/ french vanilla creamer and sweet and lo.  Breakfast: grits w/kolby jack cheese, bacon, boiled egg  Lunch: half a sandwich, bowl of soup-tomato basil Dinner: salad once a week, chicken, collards, broccoli, corn, peas (bolied veggies)   Exercise: treadmill 55min daily, sometimes an extra 10-15 min. Sit-ups-machines at [employer].   Family History: The patient's family history includes Cancer in his father; Diabetes in his maternal grandfather, maternal grandmother, mother, and sister; Heart attack in his maternal grandfather, maternal grandmother, mother, paternal  grandfather, and paternal grandmother; Heart disease in his father and mother; Hyperlipidemia in his sister; Hypertension in his maternal grandfather, mother, sister, and sister; Stroke in his maternal grandmother and mother.  Social History: former smoker,no ETOH use  Labs:11/20/2018 TC 170 TG 105 HDL 41 LDL 102 nonHDL 129 (rosuvastatin 5mg  daily)  Past Medical History:  Diagnosis Date  . Arthritis   . Chest pain    a. Normal cors 2001. b. Neg stress test 2012, 05/2014.  . Diabetes mellitus without complication ([place])   . Hyperlipidemia   . Hypertensive response to exercise   . Obesity   . Obstructive sleep apnea    Currently untreated     Current Outpatient Medications on File Prior to Visit  Medication Sig Dispense Refill  . aspirin 81 MG tablet Take 81 mg by mouth daily.    . Cholecalciferol (VITAMIN D3) 5000 units CAPS Take 1 capsule by mouth daily.    . Empagliflozin-Linagliptin 25-5 MG TABS Take 1 tablet by mouth daily. 30 tablet 11  . fluticasone (FLONASE) 50 MCG/ACT nasal spray One spray in each nostril twice a day, use left hand for right nostril, and right hand for left nostril. 48 g 3  . insulin glargine (LANTUS) 100 UNIT/ML injection Inject 40 Units into the skin 2 (two) times daily.    Marland Kitchen losartan (COZAAR) 25 MG tablet Take 1 tablet (25 mg total) by mouth daily. 90 tablet 1  .  metFORMIN (GLUCOPHAGE-XR) 750 MG 24 hr tablet TAKE 1 TABLET BY MOUTH TWICE DAILY BEFORE A MEAL. FOLLOW UP VISIT WITH PCP 180 tablet 0  . Omega-3 Fatty Acids (FISH OIL) 1000 MG CAPS Take by mouth.    Marland Kitchen omeprazole (PRILOSEC) 40 MG capsule TAKE ONE CAPSULE BY MOUTH DAILY 90 capsule 1  . ONETOUCH VERIO test strip 1 each by Other route as needed.     . pioglitazone (ACTOS) 15 MG tablet Take 15 mg by mouth daily.     . propranolol ER (INDERAL LA) 60 MG 24 hr capsule TAKE ONE CAPSULE BY MOUTH DAILY 90 capsule 0  . rosuvastatin (CRESTOR) 5 MG tablet Take 1 tablet by mouth daily.    . tamsulosin (FLOMAX) 0.4  MG CAPS capsule Take 1 capsule (0.4 mg total) by mouth daily. 90 capsule 3  . traZODone (DESYREL) 50 MG tablet TAKE 1/2 TO 1 TABLET BY MOUTH AT BEDTIME AS NEEDED FOR SLEEP 90 tablet 3   No current facility-administered medications on file prior to visit.     Allergies  Allergen Reactions  . Statins     Muscle cramping all over body. Lipitor, Crestor, and pravastatin.  Verdis Prime [Liraglutide] Other (See Comments)    Caused pancreatitis    Assessment/Plan:  1. Hyperlipidemia - Patient LDL is above goal of <70. Discussed medication options with patient. 1. Zetia- pros's -oral medication with cardiovascular risk reduction data at year 3-4, cheap as long as we can get approved for tier exception. Con's- cardiovascular risk reduction comes only after 3-4 years of treatment, ~20% LDL reduction which may only get patient close to goal. 2. PCSK9i Pros's: 60% LDL reduction sure to get patient LDL to goal, cardiovascular risk reduction at 6 months of treatment Cons: very difficult to get through [place] (espically if he hasn't tried zetia yet), expensive through [employer] ($30/month). Could apply for [employer], but will most likely push patient into the donut whole. 3. Nexletol or Nexlizet Pros: ~30% LDL reduction Cons: expensive, most likely not covered without trying other formulary alternatives, no cardiovascular outcomes data available. Patient has decided he would like to try Zetia first. Will submit for tier exception.  Also discussed diet and exercise. Patient did not seem very concerned over his cholesterol. Explained that lipids are just a small piece of the puzzle that can cause vascular disease and we like to modify any risk factor that we can control to lower their risk, even it its only by a few percentage points. Re-enforced the benefits of lifestyle modification and controlling DM on cardiovascular health which are even more important than medications. Congratulated him on his steps  to eliminate unnecessary sugars from drinks and encouraged him to eliminate butter/margarine and swap [place] bacon/sausage for bacon.  Will recheck lipid panel in about 3 months.  Thank you,  Olene Floss, Pharm.D, BCPS, CPP [facility]  [address] [place], [place] [address]  Phone: [phone]; Fax: [phone]

## 2019-05-29 DIAGNOSIS — Z794 Long term (current) use of insulin: Secondary | ICD-10-CM | POA: Diagnosis not present

## 2019-05-29 DIAGNOSIS — E782 Mixed hyperlipidemia: Secondary | ICD-10-CM | POA: Diagnosis not present

## 2019-05-29 DIAGNOSIS — E1165 Type 2 diabetes mellitus with hyperglycemia: Secondary | ICD-10-CM | POA: Diagnosis not present

## 2019-06-06 DIAGNOSIS — Z794 Long term (current) use of insulin: Secondary | ICD-10-CM | POA: Diagnosis not present

## 2019-06-06 DIAGNOSIS — E782 Mixed hyperlipidemia: Secondary | ICD-10-CM | POA: Diagnosis not present

## 2019-06-06 DIAGNOSIS — E559 Vitamin D deficiency, unspecified: Secondary | ICD-10-CM | POA: Diagnosis not present

## 2019-06-06 DIAGNOSIS — E230 Hypopituitarism: Secondary | ICD-10-CM | POA: Diagnosis not present

## 2019-06-06 DIAGNOSIS — E1165 Type 2 diabetes mellitus with hyperglycemia: Secondary | ICD-10-CM | POA: Diagnosis not present

## 2019-06-06 DIAGNOSIS — I10 Essential (primary) hypertension: Secondary | ICD-10-CM | POA: Diagnosis not present

## 2019-06-11 NOTE — Telephone Encounter (Signed)
Ezetimibe already a tier 1 on patients new plan. Called to let patient know. He is to call if there are any issues.

## 2019-06-12 DIAGNOSIS — B351 Tinea unguium: Secondary | ICD-10-CM | POA: Diagnosis not present

## 2019-06-12 DIAGNOSIS — E1142 Type 2 diabetes mellitus with diabetic polyneuropathy: Secondary | ICD-10-CM | POA: Diagnosis not present

## 2019-06-19 DIAGNOSIS — E662 Morbid (severe) obesity with alveolar hypoventilation: Secondary | ICD-10-CM | POA: Diagnosis not present

## 2019-06-19 DIAGNOSIS — G4733 Obstructive sleep apnea (adult) (pediatric): Secondary | ICD-10-CM | POA: Diagnosis not present

## 2019-07-04 ENCOUNTER — Other Ambulatory Visit: Payer: Medicare Other | Admitting: *Deleted

## 2019-07-04 ENCOUNTER — Other Ambulatory Visit: Payer: Self-pay | Admitting: Sports Medicine

## 2019-07-04 ENCOUNTER — Other Ambulatory Visit: Payer: Self-pay

## 2019-07-04 DIAGNOSIS — E782 Mixed hyperlipidemia: Secondary | ICD-10-CM | POA: Diagnosis not present

## 2019-07-04 LAB — LIPID PANEL
Chol/HDL Ratio: 3.4 ratio (ref 0.0–5.0)
Cholesterol, Total: 121 mg/dL (ref 100–199)
HDL: 36 mg/dL — ABNORMAL LOW (ref >39)
LDL Chol Calc (NIH): 66 mg/dL (ref 0–99)
Triglycerides: 100 mg/dL (ref 0–149)
VLDL Cholesterol Cal: 19 mg/dL (ref 5–40)

## 2019-07-04 LAB — HEPATIC FUNCTION PANEL
ALT: 18 IU/L (ref 0–44)
AST: 15 IU/L (ref 0–40)
Albumin: 4.3 g/dL (ref 3.8–4.8)
Alkaline Phosphatase: 55 IU/L (ref 39–117)
Bilirubin Total: 0.4 mg/dL (ref 0.0–1.2)
Bilirubin, Direct: 0.13 mg/dL (ref 0.00–0.40)
Total Protein: 7.1 g/dL (ref 6.0–8.5)

## 2019-07-04 MED ORDER — TRAZODONE HCL 50 MG PO TABS: 25.0000 mg | ORAL_TABLET | Freq: Every evening | ORAL | 0 refills | Status: DC | PRN

## 2019-07-06 ENCOUNTER — Telehealth: Payer: Self-pay | Admitting: *Deleted

## 2019-07-06 DIAGNOSIS — E782 Mixed hyperlipidemia: Secondary | ICD-10-CM

## 2019-07-06 NOTE — Telephone Encounter (Signed)
Pt has been notified of lab results by phone with verbal understanding. Pt agreeable to repeat labs in 1 yr. Pt has recall to see Dr. Katrinka Blazing sometime around 03/2020. Pt asked if he could just get the lab work done then. I assured the pt that I will let Dr. Michaelle Copas nurse know of pt request. Pt thanked me for the call. Lab orders have been placed though no appt for repeat labs has been made yet. The patient has been notified of the result and verbalized understanding. All questions (if any) were answered. Danielle Rankin, [facility] 07/06/2019 10:39 AM

## 2019-07-27 ENCOUNTER — Other Ambulatory Visit: Payer: Self-pay

## 2019-07-27 DIAGNOSIS — N451 Epididymitis: Secondary | ICD-10-CM

## 2019-07-27 MED ORDER — METFORMIN HCL ER 750 MG PO TB24: ORAL_TABLET | ORAL | 0 refills | Status: DC

## 2019-08-01 ENCOUNTER — Ambulatory Visit: Payer: Medicare Other | Admitting: Osteopathic Medicine

## 2019-08-02 ENCOUNTER — Encounter: Payer: Self-pay | Admitting: Family Medicine

## 2019-08-02 ENCOUNTER — Other Ambulatory Visit: Payer: Self-pay | Admitting: Family Medicine

## 2019-08-02 ENCOUNTER — Other Ambulatory Visit: Payer: Self-pay

## 2019-08-02 ENCOUNTER — Ambulatory Visit (INDEPENDENT_AMBULATORY_CARE_PROVIDER_SITE_OTHER): Payer: Medicare Other | Admitting: Family Medicine

## 2019-08-02 VITALS — BP 134/72 | HR 78 | Temp 97.8°F | Ht 70.0 in | Wt 285.0 lb

## 2019-08-02 DIAGNOSIS — G8929 Other chronic pain: Secondary | ICD-10-CM

## 2019-08-02 DIAGNOSIS — E782 Mixed hyperlipidemia: Secondary | ICD-10-CM | POA: Diagnosis not present

## 2019-08-02 DIAGNOSIS — M25512 Pain in left shoulder: Secondary | ICD-10-CM

## 2019-08-02 DIAGNOSIS — R350 Frequency of micturition: Secondary | ICD-10-CM | POA: Diagnosis not present

## 2019-08-02 DIAGNOSIS — G250 Essential tremor: Secondary | ICD-10-CM

## 2019-08-02 DIAGNOSIS — E118 Type 2 diabetes mellitus with unspecified complications: Secondary | ICD-10-CM

## 2019-08-02 DIAGNOSIS — I10 Essential (primary) hypertension: Secondary | ICD-10-CM

## 2019-08-02 DIAGNOSIS — E1159 Type 2 diabetes mellitus with other circulatory complications: Secondary | ICD-10-CM | POA: Diagnosis not present

## 2019-08-02 DIAGNOSIS — M7541 Impingement syndrome of right shoulder: Secondary | ICD-10-CM | POA: Insufficient documentation

## 2019-08-02 DIAGNOSIS — M25511 Pain in right shoulder: Secondary | ICD-10-CM

## 2019-08-02 DIAGNOSIS — N401 Enlarged prostate with lower urinary tract symptoms: Secondary | ICD-10-CM | POA: Diagnosis not present

## 2019-08-02 DIAGNOSIS — I152 Hypertension secondary to endocrine disorders: Secondary | ICD-10-CM

## 2019-08-02 DIAGNOSIS — N451 Epididymitis: Secondary | ICD-10-CM | POA: Diagnosis not present

## 2019-08-02 DIAGNOSIS — M7542 Impingement syndrome of left shoulder: Secondary | ICD-10-CM | POA: Insufficient documentation

## 2019-08-02 LAB — POCT GLYCOSYLATED HEMOGLOBIN (HGB A1C): Hemoglobin A1C: 7.4 % — AB (ref 4.0–5.6)

## 2019-08-02 MED ORDER — METFORMIN HCL ER 750 MG PO TB24: ORAL_TABLET | ORAL | 2 refills | Status: DC

## 2019-08-02 MED ORDER — TRAZODONE HCL 50 MG PO TABS: 25.0000 mg | ORAL_TABLET | Freq: Every evening | ORAL | 2 refills | Status: DC | PRN

## 2019-08-02 NOTE — Assessment & Plan Note (Signed)
Blood pressure is at goal at for age and co-morbidities.  I recommend he continue losartan and propranolol.  In addition they were instructed to follow a low sodium diet with regular exercise to help to maintain adequate control of blood pressure.

## 2019-08-02 NOTE — Assessment & Plan Note (Signed)
Cholesterol has been well controlled with current dose of crestor and zetia. Recommend continuation.

## 2019-08-02 NOTE — Assessment & Plan Note (Signed)
Most recent A1c of  Lab Results  Component Value Date   HGBA1C 7.4 (A) 08/02/2019   indicates diabetes is much better controlled.  He will continue to see endocrinology for medication management.  Counseled on healthy, low carb diet and recommend frequent activity to help with maintaining good control of blood sugars.

## 2019-08-02 NOTE — Patient Instructions (Addendum)
Great to meet you today! I have entered a referral to orthopedics for your shoulders.  Please continue current medications.  See me again in about 6 months.  We'll update some of your labs at this visit.    Diabetes Mellitus and Standards of Medical Care Managing diabetes (diabetes mellitus) can be complicated. Your diabetes treatment may be managed by a team of health care providers, including:  A physician who specializes in diabetes (endocrinologist).  A nurse practitioner or physician assistant.  Nurses.  A diet and nutrition specialist (registered dietitian).  A certified diabetes educator (CDE).  An exercise specialist.  A pharmacist.  An eye doctor.  A foot specialist (podiatrist).  A dentist.  A primary care provider.  A mental health provider. Your health care providers follow guidelines to help you get the best quality of care. The following schedule is a general guideline for your diabetes management plan. Your health care providers may give you more specific instructions. Physical exams Upon being diagnosed with diabetes mellitus, and each year after that, your health care provider will ask about your medical and family history. He or she will also do a physical exam. Your exam may include:  Measuring your height, weight, and body mass index (BMI).  Checking your blood pressure. This will be done at every routine medical visit. Your target blood pressure may vary depending on your medical conditions, your age, and other factors.  Thyroid gland exam.  Skin exam.  Screening for damage to your nerves (peripheral neuropathy). This may include checking the pulse in your legs and feet and checking the level of sensation in your hands and feet.  A complete foot exam to inspect the structure and skin of your feet, including checking for cuts, bruises, redness, blisters, sores, or other problems.  Screening for blood vessel (vascular) problems, which may include  checking the pulse in your legs and feet and checking your temperature. Blood tests Depending on your treatment plan and your personal needs, you may have the following tests done:  HbA1c (hemoglobin A1c). This test provides information about blood sugar (glucose) control over the previous 2-3 months. It is used to adjust your treatment plan, if needed. This test will be done: ? At least 2 times a year, if you are meeting your treatment goals. ? 4 times a year, if you are not meeting your treatment goals or if treatment goals have changed.  Lipid testing, including total, LDL, and HDL cholesterol and triglyceride levels. ? The goal for LDL is less than 100 mg/dL (5.5 mmol/L). If you are at high risk for complications, the goal is less than 70 mg/dL (3.9 mmol/L). ? The goal for HDL is 40 mg/dL (2.2 mmol/L) or higher for men and 50 mg/dL (2.8 mmol/L) or higher for women. An HDL cholesterol of 60 mg/dL (3.3 mmol/L) or higher gives some protection against heart disease. ? The goal for triglycerides is less than 150 mg/dL (8.3 mmol/L).  Liver function tests.  Kidney function tests.  Thyroid function tests. Dental and eye exams  Visit your dentist two times a year.  If you have type 1 diabetes, your health care provider may recommend an eye exam 3-5 years after you are diagnosed, and then once a year after your first exam. ? For children with type 1 diabetes, a health care provider may recommend an eye exam when your child is age 6 or older and has had diabetes for 3-5 years. After the first exam, your child should get  an eye exam once a year.  If you have type 2 diabetes, your health care provider may recommend an eye exam as soon as you are diagnosed, and then once a year after your first exam. Immunizations   The yearly flu (influenza) vaccine is recommended for everyone 6 months or older who has diabetes.  The pneumonia (pneumococcal) vaccine is recommended for everyone 2 years or older  who has diabetes. If you are 93 or older, you may get the pneumonia vaccine as a series of two separate shots.  The hepatitis B vaccine is recommended for adults shortly after being diagnosed with diabetes.  Adults and children with diabetes should receive all other vaccines according to age-specific recommendations from the Centers for Disease Control and Prevention (CDC). Mental and emotional health Screening for symptoms of eating disorders, anxiety, and depression is recommended at the time of diagnosis and afterward as needed. If your screening shows that you have symptoms (positive screening result), you may need more evaluation and you may work with a mental health care provider. Treatment plan Your treatment plan will be reviewed at every medical visit. You and your health care provider will discuss:  How you are taking your medicines, including insulin.  Any side effects you are experiencing.  Your blood glucose target goals.  The frequency of your blood glucose monitoring.  Lifestyle habits, such as activity level as well as tobacco, alcohol, and substance use. Diabetes self-management education Your health care provider will assess how well you are monitoring your blood glucose levels and whether you are taking your insulin correctly. He or she may refer you to:  A certified diabetes educator to manage your diabetes throughout your life, starting at diagnosis.  A registered dietitian who can create or review your personal nutrition plan.  An exercise specialist who can discuss your activity level and exercise plan. Summary  Managing diabetes (diabetes mellitus) can be complicated. Your diabetes treatment may be managed by a team of health care providers.  Your health care providers follow guidelines in order to help you get the best quality of care.  Standards of care including having regular physical exams, blood tests, blood pressure monitoring, immunizations, screening  tests, and education about how to manage your diabetes.  Your health care providers may also give you more specific instructions based on your individual health. This information is not intended to replace advice given to you by your health care provider. Make sure you discuss any questions you have with your health care provider. Document Revised: 02/03/2018 Document Reviewed: 02/13/2016 Elsevier Patient Education  2020 [employer].

## 2019-08-02 NOTE — Progress Notes (Signed)
Gregory Ready [name] - 69 y.o. male MRN [id number]  Date of birth: 07-18-1950  Subjective No chief complaint on file.   HPI Gregory BRAR [name] is a 69 y.o. male with history of HTN, T2DM, OSA, BPH and HLD here today for follow up visit.   -HTN:  Current treatment with losartan and propranolol (which he is also taking for tremor).  He reports that he is doing well at this time.  He is followed by cardiology as well. He denies anginal symptoms or exertional dyspnea, headache or vision changes at this time  -T2DM:  Current treatment with metformin ER, pioglitazone, jardiance and lantus.  He is also followed by endocrinology through [facility].  His blood sugars at home are better controlled with averages around 120.   Last a1c at Endo was 8.0%.  He denies symptoms of hypoglycemia.    -HLD:  Current management with crestor and ezetimibe.  He is tolerating this well.  He had recent lipid panel with Dr. Katrinka Blazing.   Lab Results  Component Value Date   LDLCALC 66 07/04/2019   -BPH:  LUT's are well controlled with tamsulosin.  Denies dizziness or orthostatic symptoms.   -Shoulder Pain:  Pain in bilateral shoulders R>L.  He has seen ortho through [place] and [facility].  Had MRI showing rotator cuff tear on R previously through the [place].  He had injections but these did not provide any benefit for him.  He is at the point where he would like to proceed with surgery.   ROS:  A comprehensive ROS was completed and negative except as noted per HPI  Allergies  Allergen Reactions  . Statins     Muscle cramping all over body. Lipitor, Crestor, and pravastatin.  Verdis Prime [Liraglutide] Other (See Comments)    Caused pancreatitis    Past Medical History:  Diagnosis Date  . Arthritis   . Chest pain    a. Normal cors 2001. b. Neg stress test 2012, 05/2014.  . Diabetes mellitus without complication (HCC)   . Hyperlipidemia   . Hypertensive response to exercise   . Obesity   .  Obstructive sleep apnea    Currently untreated     Past Surgical History:  Procedure Laterality Date  . CERVICAL FUSION    . HERNIA REPAIR    . KNEE ARTHROSCOPY  1984   both knee    Social History   Socioeconomic History  . Marital status: Married    Spouse name: Not on file  . Number of children: Not on file  . Years of education: Not on file  . Highest education level: Not on file  Occupational History  . Not on file  Tobacco Use  . Smoking status: Former Smoker    Packs/day: 1.50    Years: 30.00    Pack years: 45.00    Types: Cigarettes    Quit date: 1990    Years since quitting: 31.1  . Smokeless tobacco: Never Used  Substance and Sexual Activity  . Alcohol use: No  . Drug use: No  . Sexual activity: Not on file  Other Topics Concern  . Not on file  Social History Narrative  . Not on file   Social Determinants of Health   Financial Resource Strain:   . Difficulty of Paying Living Expenses: Not on file  Food Insecurity:   . Worried About Programme researcher, broadcasting/film/video in the Last Year: Not on file  . Ran Out of Food in  the Last Year: Not on file  Transportation Needs:   . Lack of Transportation (Medical): Not on file  . Lack of Transportation (Non-Medical): Not on file  Physical Activity:   . Days of Exercise per Week: Not on file  . Minutes of Exercise per Session: Not on file  Stress:   . Feeling of Stress : Not on file  Social Connections:   . Frequency of Communication with Friends and Family: Not on file  . Frequency of Social Gatherings with Friends and Family: Not on file  . Attends Religious Services: Not on file  . Active Member of Clubs or Organizations: Not on file  . Attends Banker Meetings: Not on file  . Marital Status: Not on file    Family History  Problem Relation Age of Onset  . Diabetes Mother   . Heart disease Mother   . Hypertension Mother   . Stroke Mother   . Heart attack Mother   . Cancer Father   . Heart disease  Father   . Hyperlipidemia Sister   . Hypertension Sister   . Diabetes Maternal Grandmother   . Stroke Maternal Grandmother   . Heart attack Maternal Grandmother   . Diabetes Maternal Grandfather   . Hypertension Maternal Grandfather   . Heart attack Maternal Grandfather   . Diabetes Sister   . Hypertension Sister   . Heart attack Paternal Grandmother   . Heart attack Paternal Grandfather     Health Maintenance  Topic Date Due  . OPHTHALMOLOGY EXAM  01/19/2018  . HEMOGLOBIN A1C  08/23/2019  . FOOT EXAM  03/12/2020  . PNA vac Low Risk Adult (2 of 2 - PPSV23) 07/30/2020  . TETANUS/TDAP  07/30/2025  . COLONOSCOPY  02/23/2026  . INFLUENZA VACCINE  Completed  . Hepatitis C Screening  Completed     ----------------------------------------------------------------------------------------------------------------------------------------------------------------------------------------------------------------- Physical Exam BP 134/72   Pulse 78   Temp 97.8 F (36.6 C) (Oral)   Ht 5\' 10"  (1.778 m)   Wt 285 lb (129.3 kg)   BMI 40.89 kg/m   Physical Exam Constitutional:      Appearance: Normal appearance.  HENT:     Head: Normocephalic and atraumatic.  Eyes:     General: No scleral icterus. Cardiovascular:     Rate and Rhythm: Regular rhythm.  Pulmonary:     Effort: Pulmonary effort is normal.     Breath sounds: Normal breath sounds.  Neurological:     General: No focal deficit present.     Mental Status: He is alert.  Psychiatric:        Mood and Affect: Mood normal.     ------------------------------------------------------------------------------------------------------------------------------------------------------------------------------------------------------------------- Assessment and Plan  Hypertension associated with diabetes (HCC) Blood pressure is at goal at for age and co-morbidities.  I recommend he continue losartan and propranolol.  In addition they  were instructed to follow a low sodium diet with regular exercise to help to maintain adequate control of blood pressure.    Type II diabetes mellitus with complication Most recent A1c of  Lab Results  Component Value Date   HGBA1C 7.4 (A) 08/02/2019   indicates diabetes is much better controlled.  He will continue to see endocrinology for medication management.  Counseled on healthy, low carb diet and recommend frequent activity to help with maintaining good control of blood sugars.    Benign essential tremor Well controlled with propranolol  BPH (benign prostatic hyperplasia) LUT's well controlled with current dose of flomax.   Hyperlipidemia Cholesterol has  been well controlled with current dose of crestor and zetia. Recommend continuation.  Bilateral shoulder pain Discussed trying another injection but he declines due to not getting any benefit x2 previously.   Will refer to orthopedics.    Meds ordered this encounter  Medications  . metFORMIN (GLUCOPHAGE-XR) 750 MG 24 hr tablet    Sig: TAKE 1 TABLET BY MOUTH TWICE DAILY BEFORE A MEAL.    Dispense:  180 tablet    Refill:  2  . traZODone (DESYREL) 50 MG tablet    Sig: Take 0.5-1 tablets (25-50 mg total) by mouth at bedtime as needed. for sleep    Dispense:  90 tablet    Refill:  2    Return in about 6 months (around 02/02/2020) for HTN/T2DM/BPH.    This visit occurred during the SARS-CoV-2 public health emergency.  Safety protocols were in place, including screening questions prior to the visit, additional usage of staff PPE, and extensive cleaning of exam room while observing appropriate contact time as indicated for disinfecting solutions.

## 2019-08-02 NOTE — Assessment & Plan Note (Signed)
Well controlled with propranolol

## 2019-08-02 NOTE — Assessment & Plan Note (Signed)
LUT's well controlled with current dose of flomax.

## 2019-08-02 NOTE — Assessment & Plan Note (Signed)
Discussed trying another injection but he declines due to not getting any benefit x2 previously.   Will refer to orthopedics.

## 2019-08-03 MED ORDER — PROPRANOLOL HCL ER 60 MG PO CP24: 60.0000 mg | ORAL_CAPSULE | Freq: Every day | ORAL | 0 refills | Status: DC

## 2019-08-08 ENCOUNTER — Encounter: Payer: Self-pay | Admitting: Orthopaedic Surgery

## 2019-08-08 ENCOUNTER — Ambulatory Visit (INDEPENDENT_AMBULATORY_CARE_PROVIDER_SITE_OTHER): Payer: Medicare Other

## 2019-08-08 ENCOUNTER — Ambulatory Visit (INDEPENDENT_AMBULATORY_CARE_PROVIDER_SITE_OTHER): Payer: Medicare Other | Admitting: Orthopaedic Surgery

## 2019-08-08 ENCOUNTER — Ambulatory Visit: Payer: Self-pay

## 2019-08-08 ENCOUNTER — Other Ambulatory Visit: Payer: Self-pay

## 2019-08-08 DIAGNOSIS — G8929 Other chronic pain: Secondary | ICD-10-CM | POA: Diagnosis not present

## 2019-08-08 DIAGNOSIS — M25512 Pain in left shoulder: Secondary | ICD-10-CM

## 2019-08-08 DIAGNOSIS — M25511 Pain in right shoulder: Secondary | ICD-10-CM

## 2019-08-08 NOTE — Progress Notes (Signed)
Office Visit Note   Patient: Gregory THAMMAVONG [name]           Date of Birth: [birth year]           MRN: [id number] Visit Date: 08/08/2019              Requested by: [name], DO [address] [place],  [place] [address] PCP: [name], MD   Assessment & Plan: Visit Diagnoses:  1. Chronic left shoulder pain   2. Chronic right shoulder pain     Plan: At this point given the failed conservative treatment including steroid injections and given the significant findings on plain films of both shoulders, I am recommending an MRI of both shoulders to assess the rotator cuff as well as to assess the glenohumeral joint.  He agrees with this treatment plan.  All question concerns were answered and addressed.  He will call for follow-up after he has these MRIs.  Follow-Up Instructions: No follow-ups on file.   Orders:  Orders Placed This Encounter  Procedures  . XR Shoulder Left  . XR Shoulder Right   No orders of the defined types were placed in this encounter.     Procedures: No procedures performed   Clinical Data: No additional findings.   Subjective: Chief Complaint  Patient presents with  . Right Shoulder - Pain  . Left Shoulder - Pain  The patient is a 69 year old gentleman who comes in with bilateral shoulder pain has been worsening for years now.  He does have a history of cervical spine surgeries.  He says the pain is becoming unbearable.  Both shoulders constantly ache.  He says it is worse with reaching across and behind as well as overhead.  He reports decreased strength and range of motion of both shoulders.  He denies any known injuries to the shoulders but he was very athletic all his life and played softball for many years.  He is right-hand dominant.  His right shoulder hurts much worse than his left.  He is a diabetic and reports his last hemoglobin A1c was 7.4.  He has had numerous injections in the right shoulder with a steroid  and this has not helped.  He does have a BMI of almost 41.  Is otherwise pleasant individual to speak with and examine.  HPI  Review of Systems He currently denies any headache, chest pain, shortness of breath, fever, chills, nausea, vomiting  Objective: Vital Signs: There were no vitals taken for this visit.  Physical Exam He is alert and oriented x3 and in no acute distress Ortho Exam Examination of both shoulder shows significant pain with any attempts to abduct his shoulders past 90 degrees.  He does have weakness with external rotation of both shoulders as well as weakness with abduction.  His internal rotation with adduction is both limited with each side reaching to only the mid lumbar spine.  Both shoulder show significantly positive Neer and Hawkins signs. Specialty Comments:  No specialty comments available.  Imaging: XR Shoulder Left  Result Date: 08/08/2019 3 views of the left shoulder shows significant osteophytes around the insertion of the rotator cuff at the greater tuberosity.  There is spurring underneath the acromion.  There is also slight bone spurring at the inferior glenoid rim.  There is AC joint arthritic changes.  XR Shoulder Right  Result Date: 08/08/2019 3 views of the right shoulder show decrease in the subacromial outlet.  There is  significant osteophytes around the insertion of the rotator cuff and the greater tuberosity.  There is AC joint arthritic changes and spurring underneath the acromion.    PMFS History: Patient Active Problem List   Diagnosis Date Noted  . Bilateral shoulder pain 08/02/2019  . Interstitial pulmonary disease (HCC) 06/29/2018  . Hypertension associated with diabetes (HCC) 07/13/2017  . Benign essential tremor 07/13/2017  . Kidney cyst, acquired 01/24/2017  . Seborrheic keratoses 04/01/2016  . Diverticulosis of colon without hemorrhage 02/26/2016  . Anxiety state 10/02/2015  . BPH (benign prostatic hyperplasia) 07/31/2015  .  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 Vitamin D deficiency 03/06/2015  . History of pancreatitis 03/06/2015  . Chronic back pain 03/04/2015  . Type II diabetes mellitus with complication (HCC) 05/15/2014  . Obstructive sleep apnea 05/15/2014  . Hyperlipidemia 05/15/2014  . Glaucoma suspect 10/21/2011   Past Medical History:  Diagnosis Date  . Arthritis   . Chest pain    a. Normal cors 2001. b. Neg stress test 2012, 05/2014.  . Diabetes mellitus without complication (HCC)   . Hyperlipidemia   . Hypertensive response to exercise   . Obesity   . Obstructive sleep apnea    Currently untreated     Family History  Problem Relation Age of Onset  . Diabetes Mother   . Heart disease Mother   . Hypertension Mother   . Stroke Mother   . Heart attack Mother   . Cancer Father   . Heart disease Father   . Hyperlipidemia Sister   . Hypertension Sister   . Diabetes Maternal Grandmother   . Stroke Maternal Grandmother   . Heart attack Maternal Grandmother   . Diabetes Maternal Grandfather   . Hypertension Maternal Grandfather   . Heart attack Maternal Grandfather   . Diabetes Sister   . Hypertension Sister   . Heart attack Paternal Grandmother   . Heart attack Paternal Grandfather     Past Surgical History:  Procedure Laterality Date  . CERVICAL FUSION    . HERNIA REPAIR    . KNEE ARTHROSCOPY  1984   both knee   Social History   Occupational History  . Not on file  Tobacco Use  . Smoking status: Former Smoker    Packs/day: 1.50    Years: 30.00    Pack years: 45.00    Types: Cigarettes    Quit date: 1990    Years since quitting: 31.2  . Smokeless tobacco: Never Used  Substance and Sexual Activity  . Alcohol use: No  . Drug use: No  . Sexual activity: Not on file

## 2019-08-09 ENCOUNTER — Other Ambulatory Visit: Payer: Self-pay

## 2019-08-09 DIAGNOSIS — Z96611 Presence of right artificial shoulder joint: Secondary | ICD-10-CM

## 2019-08-09 DIAGNOSIS — Z96612 Presence of left artificial shoulder joint: Secondary | ICD-10-CM

## 2019-08-12 ENCOUNTER — Other Ambulatory Visit: Payer: Medicare Other

## 2019-08-15 ENCOUNTER — Ambulatory Visit: Payer: Medicare Other | Admitting: Orthopaedic Surgery

## 2019-08-19 ENCOUNTER — Ambulatory Visit: Payer: Medicare Other

## 2019-08-19 ENCOUNTER — Ambulatory Visit (INDEPENDENT_AMBULATORY_CARE_PROVIDER_SITE_OTHER): Payer: Medicare Other

## 2019-08-19 ENCOUNTER — Other Ambulatory Visit: Payer: Self-pay

## 2019-08-19 DIAGNOSIS — M19012 Primary osteoarthritis, left shoulder: Secondary | ICD-10-CM | POA: Diagnosis not present

## 2019-08-19 DIAGNOSIS — M19011 Primary osteoarthritis, right shoulder: Secondary | ICD-10-CM | POA: Diagnosis not present

## 2019-08-19 DIAGNOSIS — Z96611 Presence of right artificial shoulder joint: Secondary | ICD-10-CM

## 2019-08-19 DIAGNOSIS — M25412 Effusion, left shoulder: Secondary | ICD-10-CM | POA: Diagnosis not present

## 2019-08-19 DIAGNOSIS — Z96612 Presence of left artificial shoulder joint: Secondary | ICD-10-CM

## 2019-08-22 ENCOUNTER — Other Ambulatory Visit: Payer: Self-pay

## 2019-08-22 ENCOUNTER — Encounter: Payer: Self-pay | Admitting: Orthopaedic Surgery

## 2019-08-22 ENCOUNTER — Ambulatory Visit (INDEPENDENT_AMBULATORY_CARE_PROVIDER_SITE_OTHER): Payer: Medicare Other | Admitting: Orthopaedic Surgery

## 2019-08-22 DIAGNOSIS — M75111 Incomplete rotator cuff tear or rupture of right shoulder, not specified as traumatic: Secondary | ICD-10-CM | POA: Insufficient documentation

## 2019-08-22 DIAGNOSIS — M7541 Impingement syndrome of right shoulder: Secondary | ICD-10-CM | POA: Diagnosis not present

## 2019-08-22 NOTE — Progress Notes (Signed)
The patient is here today to go over MRI of both the shoulders.  He is a very active 69 year old gentleman.  He is right-hand dominant.  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  He hurts mainly with overhead activities and reaching behind him.  On examination both shoulder show significant Neer and Hawkins signs with pain.  Both arms have cross arm pain in both shoulders have some weakness on exam in general.  He has tried and failed other forms conservative treatment including activity modification and anti-inflammatories as well as strengthening exercises and stretching exercises.  He has had steroid injections as well.  The MRI of the right shoulder does show a type II acromion with moderate arthritis of the [facility] joint.  There is partial tearing of the rotator cuff and significant edema at the attachment of the rotator cuff.  There is subdeltoid and subacromial fluid collection.  There is also degenerative changes of the rotator cuff.  The MRI of the left shoulder shows a type III acromion with tendinosis of the rotator cuff.  There is subacromial and subdeltoid fluid.  There is some slight degenerative changes at the glenohumeral joint.  The glenohumeral joint is more arthritic on the right side.  I showed him a shoulder model and went over his exam in detail.  At this point with his right shoulder being the worst pain I am recommending a shoulder arthroscopy with extensive debridement.  We would also perform a subacromial decompression and address the rotator cuff tearing as appropriate depending on the extent of the tear whether to debridement versus repair.  We talked about the risk and benefits of surgery.  I described his interoperative and postoperative course and what to expect.  He does wish to proceed with the surgery given the failed conservative treatment and his continued pain.  We would then address his left shoulder at a later date.  All question concerns were answered and  addressed.  He is a diabetic with a hemoglobin A1c of 7.4.  He is moderately obese with a weight of 285.  He denies any history of sleep apnea.

## 2019-09-06 ENCOUNTER — Other Ambulatory Visit: Payer: Self-pay

## 2019-09-06 ENCOUNTER — Encounter (HOSPITAL_BASED_OUTPATIENT_CLINIC_OR_DEPARTMENT_OTHER): Payer: Self-pay | Admitting: Orthopaedic Surgery

## 2019-09-10 ENCOUNTER — Encounter (HOSPITAL_BASED_OUTPATIENT_CLINIC_OR_DEPARTMENT_OTHER)
Admission: RE | Admit: 2019-09-10 | Discharge: 2019-09-10 | Disposition: A | Discharge status: Home / Self Care | Payer: Medicare Other | Source: Ambulatory Visit | Attending: Orthopaedic Surgery | Admitting: Orthopaedic Surgery

## 2019-09-10 ENCOUNTER — Other Ambulatory Visit (HOSPITAL_COMMUNITY)
Admission: RE | Admit: 2019-09-10 | Discharge: 2019-09-10 | Disposition: A | Discharge status: Home / Self Care | Payer: Medicare Other | Source: Ambulatory Visit | Attending: Orthopaedic Surgery | Admitting: Orthopaedic Surgery

## 2019-09-10 DIAGNOSIS — E1165 Type 2 diabetes mellitus with hyperglycemia: Secondary | ICD-10-CM | POA: Diagnosis not present

## 2019-09-10 DIAGNOSIS — M19011 Primary osteoarthritis, right shoulder: Secondary | ICD-10-CM | POA: Diagnosis not present

## 2019-09-10 DIAGNOSIS — Z01812 Encounter for preprocedural laboratory examination: Secondary | ICD-10-CM | POA: Diagnosis not present

## 2019-09-10 DIAGNOSIS — E119 Type 2 diabetes mellitus without complications: Secondary | ICD-10-CM | POA: Diagnosis not present

## 2019-09-10 DIAGNOSIS — I10 Essential (primary) hypertension: Secondary | ICD-10-CM | POA: Diagnosis not present

## 2019-09-10 DIAGNOSIS — K219 Gastro-esophageal reflux disease without esophagitis: Secondary | ICD-10-CM | POA: Diagnosis not present

## 2019-09-10 DIAGNOSIS — Z20822 Contact with and (suspected) exposure to covid-19: Secondary | ICD-10-CM | POA: Insufficient documentation

## 2019-09-10 DIAGNOSIS — M75111 Incomplete rotator cuff tear or rupture of right shoulder, not specified as traumatic: Secondary | ICD-10-CM | POA: Diagnosis not present

## 2019-09-10 DIAGNOSIS — Z7982 Long term (current) use of aspirin: Secondary | ICD-10-CM | POA: Diagnosis not present

## 2019-09-10 DIAGNOSIS — E785 Hyperlipidemia, unspecified: Secondary | ICD-10-CM | POA: Diagnosis not present

## 2019-09-10 DIAGNOSIS — Z87891 Personal history of nicotine dependence: Secondary | ICD-10-CM | POA: Diagnosis not present

## 2019-09-10 DIAGNOSIS — E669 Obesity, unspecified: Secondary | ICD-10-CM | POA: Diagnosis not present

## 2019-09-10 DIAGNOSIS — E039 Hypothyroidism, unspecified: Secondary | ICD-10-CM | POA: Diagnosis not present

## 2019-09-10 DIAGNOSIS — Z79899 Other long term (current) drug therapy: Secondary | ICD-10-CM | POA: Diagnosis not present

## 2019-09-10 DIAGNOSIS — Z6841 Body Mass Index (BMI) 40.0 and over, adult: Secondary | ICD-10-CM | POA: Diagnosis not present

## 2019-09-10 DIAGNOSIS — M7541 Impingement syndrome of right shoulder: Secondary | ICD-10-CM | POA: Diagnosis not present

## 2019-09-10 DIAGNOSIS — Z794 Long term (current) use of insulin: Secondary | ICD-10-CM | POA: Diagnosis not present

## 2019-09-10 DIAGNOSIS — G4733 Obstructive sleep apnea (adult) (pediatric): Secondary | ICD-10-CM | POA: Diagnosis not present

## 2019-09-10 DIAGNOSIS — Z79890 Hormone replacement therapy (postmenopausal): Secondary | ICD-10-CM | POA: Diagnosis not present

## 2019-09-10 DIAGNOSIS — E782 Mixed hyperlipidemia: Secondary | ICD-10-CM | POA: Diagnosis not present

## 2019-09-10 DIAGNOSIS — Z888 Allergy status to other drugs, medicaments and biological substances status: Secondary | ICD-10-CM | POA: Diagnosis not present

## 2019-09-10 LAB — BASIC METABOLIC PANEL
Anion gap: 10 (ref 5–15)
BUN: 10 mg/dL (ref 8–23)
CO2: 27 mmol/L (ref 22–32)
Calcium: 8.5 mg/dL — ABNORMAL LOW (ref 8.9–10.3)
Chloride: 101 mmol/L (ref 98–111)
Creatinine, Ser: 0.77 mg/dL (ref 0.61–1.24)
GFR calc Af Amer: >60 mL/min (ref >60)
GFR calc non Af Amer: >60 mL/min (ref >60)
Glucose, Bld: 100 mg/dL — ABNORMAL HIGH (ref 70–99)
Potassium: 4.6 mmol/L (ref 3.5–5.1)
Sodium: 138 mmol/L (ref 135–145)

## 2019-09-10 LAB — SARS CORONAVIRUS 2 (TAT 6-24 HRS): SARS Coronavirus 2: NEGATIVE

## 2019-09-10 NOTE — Progress Notes (Signed)
Surgical soap given with instructions, pt verbalized understanding.  

## 2019-09-12 ENCOUNTER — Other Ambulatory Visit: Payer: Self-pay | Admitting: Physician Assistant

## 2019-09-13 ENCOUNTER — Ambulatory Visit (HOSPITAL_BASED_OUTPATIENT_CLINIC_OR_DEPARTMENT_OTHER)
Admission: RE | Admit: 2019-09-13 | Discharge: 2019-09-13 | Disposition: A | Discharge status: Home / Self Care | Payer: Medicare Other | Attending: Orthopaedic Surgery | Admitting: Orthopaedic Surgery

## 2019-09-13 ENCOUNTER — Ambulatory Visit (HOSPITAL_BASED_OUTPATIENT_CLINIC_OR_DEPARTMENT_OTHER): Payer: Medicare Other | Admitting: Anesthesiology

## 2019-09-13 ENCOUNTER — Encounter (HOSPITAL_BASED_OUTPATIENT_CLINIC_OR_DEPARTMENT_OTHER): Payer: Self-pay | Admitting: Orthopaedic Surgery

## 2019-09-13 ENCOUNTER — Encounter (HOSPITAL_BASED_OUTPATIENT_CLINIC_OR_DEPARTMENT_OTHER)
Admission: RE | Disposition: A | Discharge status: Home / Self Care | Payer: Self-pay | Source: Home / Self Care | Attending: Orthopaedic Surgery

## 2019-09-13 ENCOUNTER — Other Ambulatory Visit: Payer: Self-pay

## 2019-09-13 DIAGNOSIS — Z794 Long term (current) use of insulin: Secondary | ICD-10-CM | POA: Insufficient documentation

## 2019-09-13 DIAGNOSIS — E039 Hypothyroidism, unspecified: Secondary | ICD-10-CM | POA: Insufficient documentation

## 2019-09-13 DIAGNOSIS — E785 Hyperlipidemia, unspecified: Secondary | ICD-10-CM | POA: Diagnosis not present

## 2019-09-13 DIAGNOSIS — Z79890 Hormone replacement therapy (postmenopausal): Secondary | ICD-10-CM | POA: Insufficient documentation

## 2019-09-13 DIAGNOSIS — Z888 Allergy status to other drugs, medicaments and biological substances status: Secondary | ICD-10-CM | POA: Insufficient documentation

## 2019-09-13 DIAGNOSIS — M19011 Primary osteoarthritis, right shoulder: Secondary | ICD-10-CM | POA: Insufficient documentation

## 2019-09-13 DIAGNOSIS — E669 Obesity, unspecified: Secondary | ICD-10-CM | POA: Insufficient documentation

## 2019-09-13 DIAGNOSIS — G8918 Other acute postprocedural pain: Secondary | ICD-10-CM | POA: Diagnosis not present

## 2019-09-13 DIAGNOSIS — G4733 Obstructive sleep apnea (adult) (pediatric): Secondary | ICD-10-CM | POA: Insufficient documentation

## 2019-09-13 DIAGNOSIS — E119 Type 2 diabetes mellitus without complications: Secondary | ICD-10-CM | POA: Diagnosis not present

## 2019-09-13 DIAGNOSIS — M7541 Impingement syndrome of right shoulder: Secondary | ICD-10-CM | POA: Diagnosis present

## 2019-09-13 DIAGNOSIS — I10 Essential (primary) hypertension: Secondary | ICD-10-CM | POA: Diagnosis not present

## 2019-09-13 DIAGNOSIS — K219 Gastro-esophageal reflux disease without esophagitis: Secondary | ICD-10-CM | POA: Diagnosis not present

## 2019-09-13 DIAGNOSIS — M75101 Unspecified rotator cuff tear or rupture of right shoulder, not specified as traumatic: Secondary | ICD-10-CM | POA: Diagnosis not present

## 2019-09-13 DIAGNOSIS — Z6841 Body Mass Index (BMI) 40.0 and over, adult: Secondary | ICD-10-CM | POA: Insufficient documentation

## 2019-09-13 DIAGNOSIS — Z79899 Other long term (current) drug therapy: Secondary | ICD-10-CM | POA: Insufficient documentation

## 2019-09-13 DIAGNOSIS — M75111 Incomplete rotator cuff tear or rupture of right shoulder, not specified as traumatic: Secondary | ICD-10-CM | POA: Diagnosis not present

## 2019-09-13 DIAGNOSIS — Z7982 Long term (current) use of aspirin: Secondary | ICD-10-CM | POA: Insufficient documentation

## 2019-09-13 DIAGNOSIS — Z87891 Personal history of nicotine dependence: Secondary | ICD-10-CM | POA: Insufficient documentation

## 2019-09-13 HISTORY — DX: Other specified postprocedural states: Z98.890

## 2019-09-13 HISTORY — DX: Gastro-esophageal reflux disease without esophagitis: K21.9

## 2019-09-13 HISTORY — DX: Hypothyroidism, unspecified: E03.9

## 2019-09-13 HISTORY — DX: Nausea with vomiting, unspecified: R11.2

## 2019-09-13 HISTORY — PX: SHOULDER ARTHROSCOPY WITH SUBACROMIAL DECOMPRESSION: SHX5684

## 2019-09-13 HISTORY — DX: Essential (primary) hypertension: I10

## 2019-09-13 LAB — GLUCOSE, CAPILLARY
Glucose-Capillary: 107 mg/dL — ABNORMAL HIGH (ref 70–99)
Glucose-Capillary: 110 mg/dL — ABNORMAL HIGH (ref 70–99)

## 2019-09-13 SURGERY — SHOULDER ARTHROSCOPY WITH SUBACROMIAL DECOMPRESSION
Anesthesia: General | Site: Shoulder | Laterality: Right

## 2019-09-13 MED ORDER — ONDANSETRON HCL 4 MG/2ML IJ SOLN
INTRAMUSCULAR | Status: DC | PRN
  Administered 2019-09-13: 4 mg via INTRAVENOUS

## 2019-09-13 MED ORDER — PROMETHAZINE HCL 25 MG/ML IJ SOLN: 6.2500 mg | INTRAMUSCULAR | Status: DC | PRN

## 2019-09-13 MED ORDER — HYDROMORPHONE HCL 1 MG/ML IJ SOLN: 0.2500 mg | INTRAMUSCULAR | Status: DC | PRN

## 2019-09-13 MED ORDER — FENTANYL CITRATE (PF) 100 MCG/2ML IJ SOLN
INTRAMUSCULAR | Status: AC
  Filled 2019-09-13: qty 2

## 2019-09-13 MED ORDER — SUCCINYLCHOLINE CHLORIDE 200 MG/10ML IV SOSY
PREFILLED_SYRINGE | INTRAVENOUS | Status: AC
  Filled 2019-09-13: qty 10

## 2019-09-13 MED ORDER — LIDOCAINE 2% (20 MG/ML) 5 ML SYRINGE
INTRAMUSCULAR | Status: AC
  Filled 2019-09-13: qty 5

## 2019-09-13 MED ORDER — SODIUM CHLORIDE 0.9 % IR SOLN
Status: DC | PRN
  Administered 2019-09-13: 6000 mL

## 2019-09-13 MED ORDER — SUCCINYLCHOLINE CHLORIDE 20 MG/ML IJ SOLN
INTRAMUSCULAR | Status: DC | PRN
  Administered 2019-09-13: 70 mg via INTRAVENOUS

## 2019-09-13 MED ORDER — EPHEDRINE SULFATE 50 MG/ML IJ SOLN
INTRAMUSCULAR | Status: DC | PRN
  Administered 2019-09-13: 10 mg via INTRAVENOUS
  Administered 2019-09-13: 25 mg via INTRAVENOUS
  Administered 2019-09-13 (×3): 10 mg via INTRAVENOUS

## 2019-09-13 MED ORDER — SUCCINYLCHOLINE CHLORIDE 20 MG/ML IJ SOLN: INTRAMUSCULAR | Status: DC | PRN

## 2019-09-13 MED ORDER — MIDAZOLAM HCL 2 MG/2ML IJ SOLN
INTRAMUSCULAR | Status: AC
  Filled 2019-09-13: qty 2

## 2019-09-13 MED ORDER — EPHEDRINE 5 MG/ML INJ
INTRAVENOUS | Status: AC
  Filled 2019-09-13: qty 10

## 2019-09-13 MED ORDER — MEPERIDINE HCL 25 MG/ML IJ SOLN: 6.2500 mg | INTRAMUSCULAR | Status: DC | PRN

## 2019-09-13 MED ORDER — CEFAZOLIN SODIUM-DEXTROSE 2-4 GM/100ML-% IV SOLN
2.0000 g | INTRAVENOUS | Status: AC
  Administered 2019-09-13: 2 g via INTRAVENOUS

## 2019-09-13 MED ORDER — LIDOCAINE HCL (CARDIAC) PF 100 MG/5ML IV SOSY
PREFILLED_SYRINGE | INTRAVENOUS | Status: DC | PRN
  Administered 2019-09-13: 50 mg via INTRAVENOUS

## 2019-09-13 MED ORDER — FENTANYL CITRATE (PF) 100 MCG/2ML IJ SOLN
50.0000 ug | INTRAMUSCULAR | Status: DC | PRN
  Administered 2019-09-13: 100 ug via INTRAVENOUS

## 2019-09-13 MED ORDER — BUPIVACAINE LIPOSOME 1.3 % IJ SUSP
INTRAMUSCULAR | Status: DC | PRN
  Administered 2019-09-13: 10 mL via PERINEURAL

## 2019-09-13 MED ORDER — ONDANSETRON HCL 4 MG/2ML IJ SOLN
INTRAMUSCULAR | Status: AC
  Filled 2019-09-13: qty 2

## 2019-09-13 MED ORDER — PROPOFOL 10 MG/ML IV BOLUS
INTRAVENOUS | Status: DC | PRN
  Administered 2019-09-13: 200 mg via INTRAVENOUS

## 2019-09-13 MED ORDER — PROPOFOL 10 MG/ML IV BOLUS
INTRAVENOUS | Status: AC
  Filled 2019-09-13: qty 20

## 2019-09-13 MED ORDER — MIDAZOLAM HCL 2 MG/2ML IJ SOLN
1.0000 mg | INTRAMUSCULAR | Status: DC | PRN
  Administered 2019-09-13: 2 mg via INTRAVENOUS

## 2019-09-13 MED ORDER — OXYCODONE HCL 5 MG/5ML PO SOLN: 5.0000 mg | Freq: Once | ORAL | Status: DC | PRN

## 2019-09-13 MED ORDER — PHENYLEPHRINE 40 MCG/ML (10ML) SYRINGE FOR IV PUSH (FOR BLOOD PRESSURE SUPPORT)
PREFILLED_SYRINGE | INTRAVENOUS | Status: AC
  Filled 2019-09-13: qty 10

## 2019-09-13 MED ORDER — DEXAMETHASONE SODIUM PHOSPHATE 10 MG/ML IJ SOLN
INTRAMUSCULAR | Status: AC
  Filled 2019-09-13: qty 1

## 2019-09-13 MED ORDER — DEXAMETHASONE SODIUM PHOSPHATE 4 MG/ML IJ SOLN
INTRAMUSCULAR | Status: DC | PRN
  Administered 2019-09-13: 10 mg via INTRAVENOUS

## 2019-09-13 MED ORDER — LACTATED RINGERS IV SOLN: INTRAVENOUS | Status: DC

## 2019-09-13 MED ORDER — GLYCOPYRROLATE PF 0.2 MG/ML IJ SOSY
PREFILLED_SYRINGE | INTRAMUSCULAR | Status: AC
  Filled 2019-09-13: qty 1

## 2019-09-13 MED ORDER — BUPIVACAINE HCL (PF) 0.5 % IJ SOLN
INTRAMUSCULAR | Status: DC | PRN
  Administered 2019-09-13: 20 mL via PERINEURAL

## 2019-09-13 MED ORDER — OXYCODONE HCL 5 MG PO TABS: 5.0000 mg | ORAL_TABLET | ORAL | 0 refills | Status: DC | PRN

## 2019-09-13 MED ORDER — OXYCODONE HCL 5 MG PO TABS: 5.0000 mg | ORAL_TABLET | Freq: Once | ORAL | Status: DC | PRN

## 2019-09-13 MED ORDER — MIDAZOLAM HCL 5 MG/5ML IJ SOLN
INTRAMUSCULAR | Status: DC | PRN
  Administered 2019-09-13: 2 mg via INTRAVENOUS

## 2019-09-13 MED ORDER — CEFAZOLIN SODIUM-DEXTROSE 2-4 GM/100ML-% IV SOLN
INTRAVENOUS | Status: AC
  Filled 2019-09-13: qty 100

## 2019-09-13 MED ORDER — FENTANYL CITRATE (PF) 100 MCG/2ML IJ SOLN
INTRAMUSCULAR | Status: DC | PRN
  Administered 2019-09-13: 100 ug via INTRAVENOUS

## 2019-09-13 SURGICAL SUPPLY — 62 items
BLADE SURG 15 STRL LF DISP TIS (BLADE) IMPLANT
BLADE SURG 15 STRL SS (BLADE)
BURR OVAL 8 FLU 4.0X13 (MISCELLANEOUS) ×2 IMPLANT
BURR OVAL 8 FLU 5.0X13 (MISCELLANEOUS) IMPLANT
CANNULA TWIST IN 8.25X7CM (CANNULA) IMPLANT
COVER WAND RF STERILE (DRAPES) IMPLANT
DECANTER SPIKE VIAL GLASS SM (MISCELLANEOUS) IMPLANT
DISSECTOR  3.8MM X 13CM (MISCELLANEOUS) ×2
DISSECTOR 3.8MM X 13CM (MISCELLANEOUS) ×1 IMPLANT
DISSECTOR 4.0MM X 13CM (MISCELLANEOUS) ×1 IMPLANT
DRAPE IMP U-DRAPE 54X76 (DRAPES) ×2 IMPLANT
DRAPE SHOULDER BEACH CHAIR (DRAPES) ×2 IMPLANT
DRAPE U-SHAPE 47X51 STRL (DRAPES) ×2 IMPLANT
DRSG PAD ABDOMINAL 8X10 ST (GAUZE/BANDAGES/DRESSINGS) ×3 IMPLANT
DURAPREP 26ML APPLICATOR (WOUND CARE) ×2 IMPLANT
ELECT REM PT RETURN 9FT ADLT (ELECTROSURGICAL)
ELECTRODE REM PT RTRN 9FT ADLT (ELECTROSURGICAL) IMPLANT
GAUZE SPONGE 4X4 12PLY STRL (GAUZE/BANDAGES/DRESSINGS) ×2 IMPLANT
GAUZE XEROFORM 1X8 LF (GAUZE/BANDAGES/DRESSINGS) ×2 IMPLANT
GLOVE BIO SURGEON STRL SZ7.5 (GLOVE) ×2 IMPLANT
GLOVE BIOGEL PI IND STRL 7.5 (GLOVE) IMPLANT
GLOVE BIOGEL PI IND STRL 8 (GLOVE) ×2 IMPLANT
GLOVE BIOGEL PI INDICATOR 7.5 (GLOVE) ×1
GLOVE BIOGEL PI INDICATOR 8 (GLOVE) ×2
GLOVE SURG ORTHO 8.0 STRL STRW (GLOVE) ×2 IMPLANT
GLOVE SURG SS PI 7.5 STRL IVOR (GLOVE) ×1 IMPLANT
GOWN STRL REUS W/ TWL LRG LVL3 (GOWN DISPOSABLE) ×1 IMPLANT
GOWN STRL REUS W/ TWL XL LVL3 (GOWN DISPOSABLE) ×1 IMPLANT
GOWN STRL REUS W/TWL LRG LVL3 (GOWN DISPOSABLE)
GOWN STRL REUS W/TWL XL LVL3 (GOWN DISPOSABLE) ×4
KIT SHOULDER TRACTION (DRAPES) ×2 IMPLANT
MANIFOLD NEPTUNE II (INSTRUMENTS) ×2 IMPLANT
NDL SAFETY ECLIPSE 18X1.5 (NEEDLE) IMPLANT
NDL SCORPION MULTI FIRE (NEEDLE) IMPLANT
NDL SPNL 18GX3.5 QUINCKE PK (NEEDLE) IMPLANT
NEEDLE HYPO 18GX1.5 SHARP (NEEDLE)
NEEDLE SCORPION MULTI FIRE (NEEDLE) IMPLANT
NEEDLE SPNL 18GX3.5 QUINCKE PK (NEEDLE) ×2 IMPLANT
NS IRRIG 1000ML POUR BTL (IV SOLUTION) IMPLANT
PACK ARTHROSCOPY DSU (CUSTOM PROCEDURE TRAY) ×2 IMPLANT
PACK BASIN DAY SURGERY FS (CUSTOM PROCEDURE TRAY) ×2 IMPLANT
PAD ORTHO SHOULDER 7X19 LRG (SOFTGOODS) IMPLANT
PENCIL SMOKE EVACUATOR (MISCELLANEOUS) IMPLANT
PORT APPOLLO RF 90DEGREE MULTI (SURGICAL WAND) ×2 IMPLANT
RESTRAINT HEAD UNIVERSAL NS (MISCELLANEOUS) ×2 IMPLANT
SLING ARM FOAM STRAP LRG (SOFTGOODS) IMPLANT
SLING ARM FOAM STRAP XLG (SOFTGOODS) ×1 IMPLANT
SLING ULTRA II MEDIUM (SOFTGOODS) IMPLANT
SPONGE LAP 4X18 RFD (DISPOSABLE) IMPLANT
SUCTION FRAZIER HANDLE 10FR (MISCELLANEOUS)
SUCTION TUBE FRAZIER 10FR DISP (MISCELLANEOUS) IMPLANT
SUT ETHIBOND 2 OS 4 DA (SUTURE) IMPLANT
SUT ETHILON 3 0 PS 1 (SUTURE) ×2 IMPLANT
SUT VIC AB 2-0 SH 27 (SUTURE)
SUT VIC AB 2-0 SH 27XBRD (SUTURE) IMPLANT
SYR 20ML LL LF (SYRINGE) IMPLANT
SYR BULB 3OZ (MISCELLANEOUS) IMPLANT
TAPE HYPAFIX 6X30 (GAUZE/BANDAGES/DRESSINGS) ×2 IMPLANT
TOWEL GREEN STERILE FF (TOWEL DISPOSABLE) ×2 IMPLANT
TUBE CONNECTING 20X1/4 (TUBING) ×2 IMPLANT
TUBING ARTHROSCOPY IRRIG 16FT (MISCELLANEOUS) ×2 IMPLANT
WATER STERILE IRR 1000ML POUR (IV SOLUTION) ×2 IMPLANT

## 2019-09-13 NOTE — H&P (Signed)
Gregory West. is an 69 y.o. male.   Chief Complaint: Chronic right shoulder pain and weakness HPI: The patient is a 69 year old active individual who is performed a lot of heavy work over the years and has been very active for many years now.  He has a chief complaint of bilateral shoulder pain.  The right is hurts much worse than the left.  Recently MRI both shoulders.  The right shoulder does show significant signs of impingement with inflammation and tendinosis of the rotator cuff.  There is partial-thickness tearing of the cuff itself.  There is significant signs of impingement on clinical exam as well.  We have tried activity modification and other conservative treatment measures including anti-inflammatories and steroid injections as well as exercises.  Given the failure of conservative treatment and his continued pain weakness at this point an arthroscopic intervention for the right shoulder has been recommended.  Past Medical History:  Diagnosis Date  . Arthritis   . Chest pain    a. Normal cors 2001. b. Neg stress test 2012, 05/2014.  . Diabetes mellitus without complication (HCC)   . GERD (gastroesophageal reflux disease)   . Hyperlipidemia   . Hypertension   . Hypertensive response to exercise   . Hypothyroidism   . Obesity   . Obstructive sleep apnea    Currently untreated   . PONV (postoperative nausea and vomiting)     Past Surgical History:  Procedure Laterality Date  . CARDIAC CATHETERIZATION    . CERVICAL FUSION    . HERNIA REPAIR    . KNEE ARTHROSCOPY  1984   both knee    Family History  Problem Relation Age of Onset  . Diabetes Mother   . Heart disease Mother   . Hypertension Mother   . Stroke Mother   . Heart attack Mother   . Cancer Father   . Heart disease Father   . Hyperlipidemia Sister   . Hypertension Sister   . Diabetes Maternal Grandmother   . Stroke Maternal Grandmother   . Heart attack Maternal Grandmother   . Diabetes Maternal  Grandfather   . Hypertension Maternal Grandfather   . Heart attack Maternal Grandfather   . Diabetes Sister   . Hypertension Sister   . Heart attack Paternal Grandmother   . Heart attack Paternal Grandfather    Social History:  reports that he quit smoking about 31 years ago. His smoking use included cigarettes. He has a 45.00 pack-year smoking history. He has never used smokeless tobacco. He reports that he does not drink alcohol or use drugs.  Allergies:  Allergies  Allergen Reactions  . Statins     Muscle cramping all over body. Lipitor, Crestor, and pravastatin.  Verdis Prime [Liraglutide] Other (See Comments)    Caused pancreatitis    Medications Prior to Admission  Medication Sig Dispense Refill  . aspirin 81 MG tablet Take 81 mg by mouth daily.    . Cholecalciferol (VITAMIN D3) 5000 units CAPS Take 1 capsule by mouth daily.    . empagliflozin (JARDIANCE) 25 MG TABS tablet Take 25 mg by mouth daily.    Marland Kitchen ezetimibe (ZETIA) 10 MG tablet Take 1 tablet (10 mg total) by mouth daily. 90 tablet 3  . fluticasone (FLONASE) 50 MCG/ACT nasal spray One spray in each nostril twice a day, use left hand for right nostril, and right hand for left nostril. 48 g 3  . furosemide (LASIX) 20 MG tablet Take 20 mg by mouth daily.    Marland Kitchen  insulin glargine (LANTUS) 100 UNIT/ML injection Inject 40 Units into the skin 2 (two) times daily.    Marland Kitchen levothyroxine (SYNTHROID) 25 MCG tablet Take 25 mcg by mouth daily before breakfast.    . losartan (COZAAR) 25 MG tablet Take 1 tablet (25 mg total) by mouth daily. 90 tablet 1  . metFORMIN (GLUCOPHAGE-XR) 750 MG 24 hr tablet TAKE 1 TABLET BY MOUTH TWICE DAILY BEFORE A MEAL. 180 tablet 2  . Omega-3 Fatty Acids (FISH OIL) 1000 MG CAPS Take by mouth.    Marland Kitchen omeprazole (PRILOSEC) 40 MG capsule TAKE ONE CAPSULE BY MOUTH DAILY 90 capsule 1  . pioglitazone (ACTOS) 15 MG tablet Take 15 mg by mouth daily.     Marland Kitchen POTASSIUM PO Take 10 mEq by mouth.    . propranolol ER (INDERAL LA)  60 MG 24 hr capsule Take 1 capsule (60 mg total) by mouth daily. 90 capsule 0  . rosuvastatin (CRESTOR) 5 MG tablet Take 1 tablet by mouth daily.    . tamsulosin (FLOMAX) 0.4 MG CAPS capsule Take 1 capsule (0.4 mg total) by mouth daily. 90 capsule 3  . traZODone (DESYREL) 50 MG tablet Take 0.5-1 tablets (25-50 mg total) by mouth at bedtime as needed. for sleep 90 tablet 2  . ONETOUCH VERIO test strip 1 each by Other route as needed.       Results for orders placed or performed during the hospital encounter of 09/13/19 (from the past 48 hour(s))  Glucose, capillary     Status: Abnormal   Collection Time: 09/13/19  9:14 AM  Result Value Ref Range   Glucose-Capillary 107 (H) 70 - 99 mg/dL    Comment: Glucose reference range applies only to samples taken after fasting for at least 8 hours.   No results found.  Review of Systems  All other systems reviewed and are negative.   Blood pressure 131/67, pulse 69, temperature (!) 97.3 F (36.3 C), temperature source Tympanic, resp. rate 16, height 5\' 10"  (1.778 m), weight 126 kg, SpO2 98 %. Physical Exam  Constitutional: He is oriented to person, place, and time. He appears well-developed and well-nourished.  HENT:  Head: Normocephalic and atraumatic.  Eyes: Pupils are equal, round, and reactive to light. EOM are normal.  Cardiovascular: Normal rate.  Respiratory: Effort normal.  GI: Soft.  Musculoskeletal:     Right shoulder: Tenderness and bony tenderness present. Decreased range of motion. Decreased strength.     Cervical back: Normal range of motion and neck supple.  Neurological: He is alert and oriented to person, place, and time.  Skin: Skin is warm and dry.  Psychiatric: He has a normal mood and affect.     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  There will be potential for rotator cuff repair depending on the nature of the tissue that we find.  The risk and benefits of surgery have been discussed in detail.  Mcarthur Rossetti, MD 09/13/2019, 9:23 AM

## 2019-09-13 NOTE — Op Note (Signed)
NAME: Carr, Shartzer MEDICAL RECORD ZJ:[id number] ACCOUNT [id number] DATE OF BIRTH:1950/06/11 FACILITY: [facility] LOCATION: [facility] PHYSICIAN:Dereona Kolodny Kerry [place], MD  OPERATIVE REPORT  DATE OF PROCEDURE:  09/13/2019  PREOPERATIVE DIAGNOSIS:  Right shoulder severe impingement syndrome with partial thickness rotator cuff tear.  POSTOPERATIVE DIAGNOSIS:  Right shoulder severe impingement syndrome with partial thickness rotator cuff tear.  PROCEDURE:  Right shoulder arthroscopy with extensive debridement and subacromial decompression.  SURGEON:  Lind Guest. Ninfa Linden, MD  ASSISTANT:  Erskine Emery, PA-C  ANESTHESIA:   1.  Right upper extremity block. 2.  General.  ESTIMATED BLOOD LOSS:  Minimal.  COMPLICATIONS:  None.  INDICATIONS:  The patient is a 69 year old active individual with severe impingement syndrome involving his right shoulder.  This has been verified on clinical exam.  He has worked on activity modification and anti-inflammatories as well as steroid  injections in the subacromial space.  He has worked on exercise program as well.  After failure of conservative treatment, MRI was obtained and it showed some partial surface tearing of the rotator cuff, but no retraction and no full thickness tear, but  it did show significant tendinosis of the rotator cuff with arthropathy of the [facility] joint and downsloping acromion.  With this being said and failure of conservative treatment, he did wish to proceed with an arthroscopic intervention.  DESCRIPTION OF PROCEDURE:  After informed consent was obtained and appropriate right shoulder was marked and anesthesia obtained, and a block of the right upper extremity in the holding room, he was then brought to the operating room and placed supine on  the operating table.  General anesthesia was then obtained.  He was then fashioned into a beach chair position with appropriate positioning of the head and neck and padding of the  down nonoperative left arm.  He was then fashioned into a beach chair  position after securing his airway with appropriate position of the head and neck and bending at the waist and knees and palpable pulse in his feet.  All bony prominences were well padded.  His right operative shoulder was then prepped and draped with  DuraPrep and sterile drapes and placed in line skeletal traction using a fishing pole traction device.  Had neutral rotation at 45 degrees of forward flexion, and 10 pounds of traction.  A time-out was called.  He was identified as correct patient,  correct right shoulder.  I then made a posterolateral arthroscopy portal and entered the glenohumeral joint.  There were certainly irregularities of the biceps tendon in the intraarticular portion and there was a lot of inflamed tissue in the anterior  capsule.  There was some degenerative fraying of the rotator cuff on the undersurface and the anterior labrum.  An anterior portal was made in the rotator interval and I carried out extensive debridement of the glenohumeral joint.  I was pleased to find  better cartilage on the humeral head and the glenoid face than what was indicated on the MRI.  There was really no significant arthritis.  Once I carried out a thorough debridement of the rotator interval and the anterior capsule as well as the anterior  labrum and the undersurface of the rotator cuff we entered the subacromial space.  Of note, the subscapularis tendon was also intact.  When we entered the subacromial space, it was very tight space with significant tendinosis of the rotator cuff and  bursitis.  Using a lateral portal a soft tissue ablation wand was inserted in the subacromial space  and we carried out an extensive debridement using that an arthroscopic shaver.  We removed thickened tissue off the rotator cuff and then put the shoulder  through internal and external rotation and removed the unit and the rotator cuff itself was  intact.  I used a high-speed bur on the acromion to perform a partial acromioplasty to decompress the space as well.  We did not release the coracoacromial  ligament.  Given his girth, I was unable to get to the [facility] joint adequately.  He does have a BMI of 40 and this certainly preclude [place] doing any type of arthroscopic distal clavicle resection.  I did coplane underneath the acromion and the clavicle itself.   Once I felt we had adequately debrided the shoulder and decompressed the subacromial space, removed all instrumentation.  All portal sites were closed with interrupted nylon suture.  Xeroform well-padded sterile dressing was applied, and shoulder was  placed in a sling.  He was awakened, extubated, and taken to recovery room in stable condition.  All final counts were correct.  There were no complications noted.  CN/NUANCE  D:09/13/2019 T:09/13/2019 JOB:[id number]

## 2019-09-13 NOTE — Transfer of Care (Signed)
Immediate Anesthesia Transfer of Care Note  Patient: [name]  Procedure(s) Performed: RIGHT SHOULDER ARTHROSCOPY WITH EXTENSIVE DEBRIDEMENT, SUBACROMIAL DECOMPRESSION, (Right Shoulder)  Patient Location: PACU  Anesthesia Type:GA combined with regional for post-op pain  Level of Consciousness: awake and patient cooperative  Airway & Oxygen Therapy: Patient Spontanous Breathing and Patient connected to face mask oxygen  Post-op Assessment: Report given to RN and Post -op Vital signs reviewed and stable  Post vital signs: Reviewed and stable  Last Vitals:  Vitals Value Taken Time  BP    Temp    Pulse 76 09/13/19 1241  Resp 10 09/13/19 1241  SpO2 97 % 09/13/19 1241    Last Pain:  Vitals:   09/13/19 0903  TempSrc: Tympanic  PainSc: 8       Patients Stated Pain Goal: 5 (09/13/19 0903)  Complications: No apparent anesthesia complications

## 2019-09-13 NOTE — Progress Notes (Signed)
Assisted Dr. Miller with right, ultrasound guided, interscalene  block. Side rails up, monitors on throughout procedure. See vital signs in flow sheet. Tolerated Procedure well. 

## 2019-09-13 NOTE — Anesthesia Preprocedure Evaluation (Signed)
Anesthesia Evaluation  Patient identified by MRN, date of birth, ID band Patient awake    Reviewed: Allergy & Precautions, NPO status , Patient's Chart, lab work & pertinent test results  History of Anesthesia Complications (+) PONV  Airway Mallampati: II  TM Distance: >3 FB Neck ROM: Full    Dental no notable dental hx.    Pulmonary sleep apnea , former smoker,    Pulmonary exam normal breath sounds clear to auscultation       Cardiovascular hypertension, Pt. on medications negative cardio ROS Normal cardiovascular exam Rhythm:Regular Rate:Normal     Neuro/Psych Anxiety negative neurological ROS  negative psych ROS   GI/Hepatic Neg liver ROS, GERD  ,  Endo/Other  diabetesHypothyroidism   Renal/GU negative Renal ROS  negative genitourinary   Musculoskeletal  (+) Arthritis , Osteoarthritis,    Abdominal (+) + obese,   Peds negative pediatric ROS (+)  Hematology negative hematology ROS (+)   Anesthesia Other Findings   Reproductive/Obstetrics negative OB ROS                             Anesthesia Physical Anesthesia Plan  ASA: III  Anesthesia Plan: General   Post-op Pain Management:  Regional for Post-op pain   Induction: Intravenous  PONV Risk Score and Plan: 3 and Ondansetron, Dexamethasone, Midazolam and Treatment may vary due to age or medical condition  Airway Management Planned: LMA  Additional Equipment:   Intra-op Plan:   Post-operative Plan: Extubation in OR  Informed Consent: I have reviewed the patients History and Physical, chart, labs and discussed the procedure including the risks, benefits and alternatives for the proposed anesthesia with the patient or authorized representative who has indicated his/her understanding and acceptance.     Dental advisory given  Plan Discussed with: CRNA  Anesthesia Plan Comments:         Anesthesia Quick  Evaluation

## 2019-09-13 NOTE — Anesthesia Procedure Notes (Signed)
Anesthesia Regional Block: Interscalene brachial plexus block   Pre-Anesthetic Checklist: ,, timeout performed, Correct Patient, Correct Site, Correct Laterality, Correct Procedure, Correct Position, site marked, Risks and benefits discussed,  Surgical consent,  Pre-op evaluation,  At surgeon's request and post-op pain management  Laterality: Right  Prep: chloraprep       Needles:  Injection technique: Single-shot  Needle Type: Stimiplex      Needle Gauge: 21     Additional Needles:   Procedures:,,,, ultrasound used (permanent image in chart),,,,  Narrative:  Start time: 09/13/2019 9:48 AM End time: 09/13/2019 9:53 AM Injection made incrementally with aspirations every 5 mL.  Performed by: Personally  Anesthesiologist: Lowella Curb, MD

## 2019-09-13 NOTE — Brief Op Note (Signed)
09/13/2019  12:16 PM  PATIENT:  [name]  69 y.o. male  PRE-OPERATIVE DIAGNOSIS:  Severe impingement, partial rotator cuff tear right shoulder  POST-OPERATIVE DIAGNOSIS:  Severe impingement, partial rotator cuff tear right shoulder  PROCEDURE:  Procedure(s): RIGHT SHOULDER ARTHROSCOPY WITH EXTENSIVE DEBRIDEMENT, SUBACROMIAL DECOMPRESSION, (Right)  SURGEON:  Surgeon(s) and Role:    * Kathryne Hitch, MD - Primary  PHYSICIAN ASSISTANT: Rexene Edison, PA-C   ANESTHESIA:   regional and general  COUNTS:  YES  TOURNIQUET:  * No tourniquets in log *  DICTATION: .Other Dictation: Dictation Number [id number]  PLAN OF CARE: Discharge to home after PACU  PATIENT DISPOSITION:  PACU - hemodynamically stable.   Delay start of Pharmacological VTE agent (>24hrs) due to surgical blood loss or risk of bleeding: no

## 2019-09-13 NOTE — Anesthesia Postprocedure Evaluation (Signed)
Anesthesia Post Note  Patient: [name]  Procedure(s) Performed: RIGHT SHOULDER ARTHROSCOPY WITH EXTENSIVE DEBRIDEMENT, SUBACROMIAL DECOMPRESSION, (Right Shoulder)     Patient location during evaluation: PACU Anesthesia Type: General Level of consciousness: awake and alert Pain management: pain level controlled Vital Signs Assessment: post-procedure vital signs reviewed and stable Respiratory status: spontaneous breathing, nonlabored ventilation and respiratory function stable Cardiovascular status: blood pressure returned to baseline and stable Postop Assessment: no apparent nausea or vomiting Anesthetic complications: no    Last Vitals:  Vitals:   09/13/19 1345 09/13/19 1406  BP: 115/68 124/72  Pulse: 68 71  Resp: 14 14  Temp:    SpO2: 95% 93%    Last Pain:  Vitals:   09/13/19 1406  TempSrc:   PainSc: 0-No pain                 Lowella Curb

## 2019-09-13 NOTE — Anesthesia Procedure Notes (Signed)
Procedure Name: Intubation Date/Time: 09/13/2019 11:19 AM Performed by: Vandervoort Desanctis, CRNA Pre-anesthesia Checklist: Patient identified, Emergency Drugs available, Suction available, Patient being monitored and Timeout performed Patient Re-evaluated:Patient Re-evaluated prior to induction Oxygen Delivery Method: Circle system utilized Preoxygenation: Pre-oxygenation with 100% oxygen Induction Type: IV induction Ventilation: Mask ventilation without difficulty Laryngoscope Size: Miller and 3 Grade View: Grade III Tube type: Oral Tube size: 8.0 mm Number of attempts: 1 Airway Equipment and Method: Stylet and Oral airway Placement Confirmation: ETT inserted through vocal cords under direct vision,  positive ETCO2 and breath sounds checked- equal and bilateral Secured at: 22 cm Tube secured with: Tape Dental Injury: Teeth and Oropharynx as per pre-operative assessment

## 2019-09-13 NOTE — Discharge Instructions (Signed)
Wear your sling until your shoulder block wears off. You can come in and out of the sling daily as comfort allows. Do work on moving your shoulder and arm as much as possible, but no lifting overhead. You may remove your dressings in 24 hours and get your incisions wet in the shower daily. Place band-aids over your incisions daily.  Regional Anesthesia Blocks  1. Numbness or the inability to move the "blocked" extremity may last from 3-48 hours after placement. The length of time depends on the medication injected and your individual response to the medication. If the numbness is not going away after 48 hours, call your surgeon.  2. The extremity that is blocked will need to be protected until the numbness is gone and the  Strength has returned. Because you cannot feel it, you will need to take extra care to avoid injury. Because it may be weak, you may have difficulty moving it or using it. You may not know what position it is in without looking at it while the block is in effect.  3. For blocks in the legs and feet, returning to weight bearing and walking needs to be done carefully. You will need to wait until the numbness is entirely gone and the strength has returned. You should be able to move your leg and foot normally before you try and bear weight or walk. You will need someone to be with you when you first try to ensure you do not fall and possibly risk injury.  4. Bruising and tenderness at the needle site are common side effects and will resolve in a few days.  5. Persistent numbness or new problems with movement should be communicated to the surgeon or the [facility] [phone] [facility] [phone]).   Post Anesthesia Home Care Instructions  Activity: Get plenty of rest for the remainder of the day. A responsible individual must stay with you for 24 hours following the procedure.  For the next 24 hours, DO NOT: -Drive a car -Social worker -Drink alcoholic beverages -Take any medication unless instructed by your physician -Make any legal decisions or sign important papers.  Meals: Start with liquid foods such as gelatin or soup. Progress to regular foods as tolerated. Avoid greasy, spicy, heavy foods. If nausea and/or vomiting occur, drink only clear liquids until the nausea and/or vomiting subsides. Call your physician if vomiting continues.  Special Instructions/Symptoms: Your throat may feel dry or sore from the anesthesia or the breathing tube placed in your throat during surgery. If this causes discomfort, gargle with warm salt water. The discomfort should disappear within 24 hours.  If you had a scopolamine patch placed behind your ear for the management of post- operative nausea and/or vomiting:  1. The medication in the patch is effective for 72 hours, after which it should be removed.  Wrap patch in a tissue and discard in the trash. Wash hands thoroughly with soap and water. 2. You may remove the patch earlier than 72 hours if you experience unpleasant side effects which may include dry mouth, dizziness or visual disturbances. 3. Avoid touching the patch. Wash your hands with soap and water after contact with the patch.

## 2019-09-14 ENCOUNTER — Telehealth: Payer: Self-pay | Admitting: Radiology

## 2019-09-14 NOTE — Telephone Encounter (Signed)
Patient wife called in on triage.   Patient had R. Shoulder scope yesterday with Dr. Magnus Ivan. Complains patient is no coughing up dark colored phlegm, possibly blood. Patient is doing well with shoulder. Could this be from anything during surgery?  Advised they may need to call their PCP for this type of symptom.  CB # [phone]

## 2019-09-14 NOTE — Telephone Encounter (Signed)
That could be from just being intubated from his surgery.  He still may need to check with his PCP.

## 2019-09-17 NOTE — Telephone Encounter (Signed)
Called patient this morning. Spoke with patients wife. Patient is feeling much better. No longer coughing up any more dark colored phlegm.

## 2019-09-18 ENCOUNTER — Encounter: Payer: Self-pay | Admitting: *Deleted

## 2019-09-19 DIAGNOSIS — E782 Mixed hyperlipidemia: Secondary | ICD-10-CM | POA: Diagnosis not present

## 2019-09-19 DIAGNOSIS — Z794 Long term (current) use of insulin: Secondary | ICD-10-CM | POA: Diagnosis not present

## 2019-09-19 DIAGNOSIS — E559 Vitamin D deficiency, unspecified: Secondary | ICD-10-CM | POA: Diagnosis not present

## 2019-09-19 DIAGNOSIS — E230 Hypopituitarism: Secondary | ICD-10-CM | POA: Diagnosis not present

## 2019-09-19 DIAGNOSIS — I10 Essential (primary) hypertension: Secondary | ICD-10-CM | POA: Diagnosis not present

## 2019-09-19 DIAGNOSIS — E1165 Type 2 diabetes mellitus with hyperglycemia: Secondary | ICD-10-CM | POA: Diagnosis not present

## 2019-09-20 ENCOUNTER — Other Ambulatory Visit: Payer: Self-pay

## 2019-09-20 ENCOUNTER — Ambulatory Visit (INDEPENDENT_AMBULATORY_CARE_PROVIDER_SITE_OTHER): Payer: Medicare Other | Admitting: Orthopaedic Surgery

## 2019-09-20 ENCOUNTER — Encounter: Payer: Self-pay | Admitting: Orthopaedic Surgery

## 2019-09-20 DIAGNOSIS — Z9889 Other specified postprocedural states: Secondary | ICD-10-CM

## 2019-09-20 NOTE — Progress Notes (Signed)
The patient is now 1 week status post a right shoulder arthroscopy with subacromial decompression and extensive debridement.  He reports that he is coming along well.  Is not taking a lot of pain medicine.  He does feel like it bothers him mostly at night.  He also states that his range of motion and strength are improving.  On examination of his right shoulder his axillary nerve is working.  He has limited range of motion of the shoulder just from the pain from surgery.  His incision sites look good so remove the sutures.  I did go over his arthroscopy pictures with him showing him a shoulder model as well.  I have shown him exercises that I want him to try for his shoulder twice daily.  I did offer him outpatient physical therapy but he states he likes to try this on his own first.  We will see him back in 4 weeks to see how he is doing overall.  All question concerns were answered and addressed.

## 2019-10-18 ENCOUNTER — Encounter: Payer: Self-pay | Admitting: Orthopaedic Surgery

## 2019-10-18 ENCOUNTER — Other Ambulatory Visit: Payer: Self-pay

## 2019-10-18 ENCOUNTER — Ambulatory Visit (INDEPENDENT_AMBULATORY_CARE_PROVIDER_SITE_OTHER): Payer: Medicare Other | Admitting: Orthopaedic Surgery

## 2019-10-18 DIAGNOSIS — Z9889 Other specified postprocedural states: Secondary | ICD-10-CM

## 2019-10-18 DIAGNOSIS — M7542 Impingement syndrome of left shoulder: Secondary | ICD-10-CM

## 2019-10-18 NOTE — Progress Notes (Signed)
The patient is now 5 weeks out from right shoulder arthroscopy with extensive debridement and subacromial decompression.  He reports that his motion is good he is doing well overall.  He states that the Thera-Band has helped.  He reports better strength.  He still has problems reaching behind him.  He is someone who does weigh over 250 pounds and did use to benchpress over 300 pounds.  He is interested in having a left shoulder arthroscopy later in the summer.  I agree with this as well based on his MRI findings of the left shoulder.  Examination both shoulders shows still limited internal rotation with adduction but he is getting there.  He is got good strength overall he is able to reach above his head.  His right shoulder still somewhat painful in the surgery but much improved from preoperative.  His right shoulder strength is good.  His left shoulder still shows significant signs of impingement.  At this point we will see him back in about 5 weeks.  At that visit we can work on setting him up for a left shoulder arthroscopy with subacromial decompression.  All questions and concerns were answered and addressed.

## 2019-10-19 ENCOUNTER — Encounter: Payer: Self-pay | Admitting: Family Medicine

## 2019-10-19 ENCOUNTER — Other Ambulatory Visit: Payer: Self-pay

## 2019-11-05 DIAGNOSIS — E1142 Type 2 diabetes mellitus with diabetic polyneuropathy: Secondary | ICD-10-CM | POA: Diagnosis not present

## 2019-11-05 DIAGNOSIS — B351 Tinea unguium: Secondary | ICD-10-CM | POA: Diagnosis not present

## 2019-11-05 DIAGNOSIS — L851 Acquired keratosis [keratoderma] palmaris et plantaris: Secondary | ICD-10-CM | POA: Diagnosis not present

## 2019-11-22 ENCOUNTER — Encounter: Payer: Self-pay | Admitting: Orthopaedic Surgery

## 2019-11-22 ENCOUNTER — Ambulatory Visit (INDEPENDENT_AMBULATORY_CARE_PROVIDER_SITE_OTHER): Payer: Medicare Other | Admitting: Orthopaedic Surgery

## 2019-11-22 ENCOUNTER — Other Ambulatory Visit: Payer: Self-pay

## 2019-11-22 DIAGNOSIS — Z9889 Other specified postprocedural states: Secondary | ICD-10-CM

## 2019-11-22 DIAGNOSIS — M7542 Impingement syndrome of left shoulder: Secondary | ICD-10-CM

## 2019-11-22 NOTE — Progress Notes (Signed)
The patient is now 10 weeks status post a right shoulder arthroscopy with significant debridement and subacromial decompression.  He says the shoulder is doing great at this standpoint.  His range of motion and strength are better.  He does get some clicking in that shoulder but it is asymptomatic now compared to before.  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  Examination of both her shoulder shows much improved range of motion overall and decreased pain.  He is got good strength in both shoulders.  At this point follow-up can be as needed since he is doing so well.  If things flareup for him in any way with his shoulders he knows to come back and see [place].  All questions and concerns were answered and addressed.  He will continue to increase his activities and even adding more weight to his regimen as comfort allows.

## 2019-12-24 ENCOUNTER — Ambulatory Visit: Payer: Medicare Other | Admitting: Primary Care

## 2020-01-02 DIAGNOSIS — E1165 Type 2 diabetes mellitus with hyperglycemia: Secondary | ICD-10-CM | POA: Diagnosis not present

## 2020-01-02 DIAGNOSIS — E559 Vitamin D deficiency, unspecified: Secondary | ICD-10-CM | POA: Diagnosis not present

## 2020-01-02 DIAGNOSIS — Z794 Long term (current) use of insulin: Secondary | ICD-10-CM | POA: Diagnosis not present

## 2020-01-02 DIAGNOSIS — E782 Mixed hyperlipidemia: Secondary | ICD-10-CM | POA: Diagnosis not present

## 2020-01-02 LAB — HEMOGLOBIN A1C: Hemoglobin A1C: 8.2

## 2020-01-09 ENCOUNTER — Encounter: Payer: Self-pay | Admitting: Primary Care

## 2020-01-09 ENCOUNTER — Other Ambulatory Visit: Payer: Self-pay

## 2020-01-09 ENCOUNTER — Ambulatory Visit (INDEPENDENT_AMBULATORY_CARE_PROVIDER_SITE_OTHER): Payer: Medicare Other | Admitting: Primary Care

## 2020-01-09 VITALS — BP 120/70 | HR 70 | Temp 97.7°F | Ht 70.0 in | Wt 280.0 lb

## 2020-01-09 DIAGNOSIS — E230 Hypopituitarism: Secondary | ICD-10-CM | POA: Diagnosis not present

## 2020-01-09 DIAGNOSIS — E119 Type 2 diabetes mellitus without complications: Secondary | ICD-10-CM | POA: Diagnosis not present

## 2020-01-09 DIAGNOSIS — J849 Interstitial pulmonary disease, unspecified: Secondary | ICD-10-CM | POA: Diagnosis not present

## 2020-01-09 DIAGNOSIS — K219 Gastro-esophageal reflux disease without esophagitis: Secondary | ICD-10-CM | POA: Diagnosis not present

## 2020-01-09 DIAGNOSIS — E559 Vitamin D deficiency, unspecified: Secondary | ICD-10-CM | POA: Diagnosis not present

## 2020-01-09 DIAGNOSIS — G4733 Obstructive sleep apnea (adult) (pediatric): Secondary | ICD-10-CM | POA: Diagnosis not present

## 2020-01-09 DIAGNOSIS — E782 Mixed hyperlipidemia: Secondary | ICD-10-CM | POA: Diagnosis not present

## 2020-01-09 DIAGNOSIS — I10 Essential (primary) hypertension: Secondary | ICD-10-CM | POA: Diagnosis not present

## 2020-01-09 DIAGNOSIS — E669 Obesity, unspecified: Secondary | ICD-10-CM | POA: Diagnosis not present

## 2020-01-09 MED ORDER — OMEPRAZOLE 40 MG PO CPDR: 40.0000 mg | DELAYED_RELEASE_CAPSULE | Freq: Every day | ORAL | 1 refills | Status: DC

## 2020-01-09 NOTE — Assessment & Plan Note (Signed)
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 No significant respiratory symptoms.  - HRCT in July 2020 showed minimal fibrosis in indeterminate for UIP pattern. - Needs repeat PFTs and CT imaging to assess for stability  - FU in 6 months with Dr. Marchelle Gearing or Mannam (former [place])

## 2020-01-09 NOTE — Assessment & Plan Note (Signed)
-   Refilling Prilosec 40mg  daily

## 2020-01-09 NOTE — Progress Notes (Signed)
@Patient  ID: [name], male    DOB: 1950-10-04, 69 y.o.   MRN: [id number]  Chief Complaint  Patient presents with  . Follow-up    Referring provider: No ref. provider found   Synopsis: Referred in June 2019 for chronic cough, he has a past medical history significant for gastroesophageal reflux disease.  He has obstructive sleep apnea.  A CT scan of his chest performed in 2019 showed nonspecific interstitial disease, very mild in the bases of his lungs.  HPI: 69 year old male, former smoker. PMH significant for interstitial pulmonary disease, OSA, HTN, type 2 diabetes. Former patient of Dr. 73, last seen on 12/08/17. HRCT imaging in July 2020 showed minimal fibrosis in indeterminate for UIP pattern. Consider ongoing CT follow-up to assess for stability of fibrotic pattern.   Previous [facility] pulmonary encounter: 11/27/17 - Dr. 11/29/17, Consult  This is a pleasant 69 year old male who comes to my clinic today for evaluation of cough.  He says it as a child he had recurrent episodes of bronchitis and this has been a problem for him for most of his life.  He says that typically he will get bronchitis every 4 to 6 months or so and have to come to the doctor for management.  He says that this did not improve when he joined the [employer] and started smoking cigarettes at a rate of 1.5 packs/day.  He did that for a total of 30 years and quit in the early 90s.  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  He says that this spring has been particularly bad for him.  He had 2 episodes of bronchitis and he said a persistent cough ever since.  His wife says that he coughs "all day".  He has not noted a specific time of day  when the cough is worse.  Sometimes foods that contain acidic components will make him cough a bit more.  The cough is been productive of yellow to brown mucus.  He says for the last 2 weeks he has continued to cough up brown mucus.  No fevers or chills.  He has some itching eyes but no scratchy throat or postnasal drip.  He says that his sinuses are typically dry.  He does have acid reflux and he recently started taking an antacid which has helped with this.  However, he says that the cough has not improved since taking the antacid.  12/08/17- Dr. 02/08/18 says he has had complete resolution of his symptoms.  Specifically he says that the cough eventually went away.  He has no more mucus production and no more cough. He says that he took the medications as we prescribed. We have noted that he is quite sleepy in the office both today and yesterday.  His wife says this is the way he is all the time.  He has obstructive sleep apnea but does not use his CPAP machine. There have not been any changes to his medications or habits otherwise.   01/09/2020- Interim hx Patient present today for follow-up visit for ILD. He has not been seen in over 2 years. He feels well. No recent bronchitis. Rare wheezing. He was taking  prilosec but ran out of medication. He gets occasional GERD symptoms depending on what he eats and if he lays down shortly after eating. he has resume CPAP therapy managed by Dr[facility] Earl Gal[facility]borne [facility]. He reports compliance and benefit from use.  HRCT in July 2020 showed minimal fibrosis. 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 He did not wear a mask while working. Denies cough, chest tightness, shortness of breath.      Allergies  Allergen Reactions  . Statins     Muscle cramping all over body. Lipitor, Crestor, and pravastatin.  Verdis Prime. Victoza [Liraglutide] Other (See Comments)    Caused pancreatitis    Immunization History  Administered Date(s) Administered    . Fluad Quad(high Dose 65+) 01/31/2019  . Influenza, High Dose Seasonal PF 01/21/2017  . Influenza,inj,Quad PF,6+ Mos 01/12/2018  . Influenza-Unspecified 02/28/2015, 03/14/2016  . Pneumococcal Conjugate-13 01/21/2017  . Pneumococcal Polysaccharide-23 07/31/2015  . Tdap 07/31/2015    Past Medical History:  Diagnosis Date  . Arthritis   . Chest pain    a. Normal cors 2001. b. Neg stress test 2012, 05/2014.  . Diabetes mellitus without complication (HCC)   . GERD (gastroesophageal reflux disease)   . Hyperlipidemia   . Hypertension   . Hypertensive response to exercise   . Hypothyroidism   . Obesity   . Obstructive sleep apnea    Currently untreated   . PONV (postoperative nausea and vomiting)     Tobacco History: Social History   Tobacco Use  Smoking Status Former Smoker  . Packs/day: 1.50  . Years: 30.00  . Pack years: 45.00  . Types: Cigarettes  . Quit date: 801990  . Years since quitting: 31.6  Smokeless Tobacco Never Used   Counseling given: Not Answered   Outpatient Medications Prior to Visit  Medication Sig Dispense Refill  . aspirin 81 MG tablet Take 81 mg by mouth daily.    . Cholecalciferol (VITAMIN D3) 5000 units CAPS Take 1 capsule by mouth daily.    . empagliflozin (JARDIANCE) 25 MG TABS tablet Take 25 mg by mouth daily.    Marland Kitchen. ezetimibe (ZETIA) 10 MG tablet Take 1 tablet (10 mg total) by mouth daily. 90 tablet 3  . fluticasone (FLONASE) 50 MCG/ACT nasal spray One spray in each nostril twice a day, use left hand for right nostril, and right hand for left nostril. 48 g 3  . furosemide (LASIX) 20 MG tablet Take 20 mg by mouth daily.    . insulin glargine (LANTUS) 100 UNIT/ML injection Inject 40 Units into the skin 2 (two) times daily.    Marland Kitchen. levothyroxine (SYNTHROID) 25 MCG tablet Take 25 mcg by mouth daily before breakfast.    . losartan (COZAAR) 25 MG tablet Take 1 tablet (25 mg total) by mouth daily. 90 tablet 1  . metFORMIN (GLUCOPHAGE-XR) 750 MG 24 hr  tablet TAKE 1 TABLET BY MOUTH TWICE DAILY BEFORE A MEAL. 180 tablet 2  . Omega-3 Fatty Acids (FISH OIL) 1000 MG CAPS Take by mouth.    [name] ONETOUCH VERIO test strip 1 each by Other route as needed.     Marland Kitchen. oxyCODONE (ROXICODONE) 5 MG immediate release tablet Take 1-2 tablets (5-10 mg total) by mouth every 4 (four) hours as needed. 40 tablet 0  . pioglitazone (ACTOS) 15 MG tablet Take 15 mg by mouth daily.     . potassium chloride (MICRO-K) 10 MEQ CR capsule TAKE ONE CAPSULE BY MOUTH DAILY AS  NEEDED -TAKE WITH LASIX    . POTASSIUM PO Take 10 mEq by mouth.    . propranolol ER (INDERAL LA) 60 MG 24 hr capsule Take 1 capsule (60 mg total) by mouth daily. 90 capsule 0  . rosuvastatin (CRESTOR) 5 MG tablet Take 1 tablet by mouth daily.    . tamsulosin (FLOMAX) 0.4 MG CAPS capsule Take 1 capsule (0.4 mg total) by mouth daily. 90 capsule 3  . traZODone (DESYREL) 50 MG tablet Take 0.5-1 tablets (25-50 mg total) by mouth at bedtime as needed. for sleep 90 tablet 2  . omeprazole (PRILOSEC) 40 MG capsule TAKE ONE CAPSULE BY MOUTH DAILY 90 capsule 1   No facility-administered medications prior to visit.   Review of Systems  Review of Systems  Constitutional: Negative.   Respiratory: Negative for cough, chest tightness, shortness of breath and wheezing.   Cardiovascular: Negative.    Physical Exam  BP 120/70 (BP Location: Left Arm, Cuff Size: Normal)   Pulse 70   Temp 97.7 F (36.5 C) (Temporal)   Ht 5\' 10"  (1.778 m)   Wt 280 lb (127 kg)   SpO2 96% Comment: RA  BMI 40.18 kg/m  Physical Exam Constitutional:      Appearance: Normal appearance.  HENT:     Head: Normocephalic and atraumatic.  Cardiovascular:     Rate and Rhythm: Normal rate and regular rhythm.  Pulmonary:     Effort: Pulmonary effort is normal.     Breath sounds: Normal breath sounds.     Comments: CTA Skin:    General: Skin is warm and dry.  Neurological:     General: No focal deficit present.     Mental Status: He is  alert and oriented to person, place, and time. Mental status is at baseline.  Psychiatric:        Mood and Affect: Mood normal.        Behavior: Behavior normal.        Thought Content: Thought content normal.        Judgment: Judgment normal.      Lab Results:  CBC    Component Value Date/Time   WBC 3.7 (L) 01/12/2018 1153   RBC 4.61 01/12/2018 1153   HGB 13.6 01/12/2018 1153   HCT 41.3 01/12/2018 1153   PLT 157 01/12/2018 1153   MCV 89.6 01/12/2018 1153   MCV 89 04/01/2015 0000   MCV 89 04/01/2015 0000   MCH 29.5 01/12/2018 1153   MCHC 32.9 01/12/2018 1153   RDW 12.8 01/12/2018 1153   LYMPHSABS 1,809 01/12/2018 1153   MONOABS 0.5 06/25/2015 1805   EOSABS 100 01/12/2018 1153   BASOSABS 11 01/12/2018 1153    BMET    Component Value Date/Time   NA 138 09/10/2019 0930   NA 138 11/20/2018 0000   K 4.6 09/10/2019 0930   CL 101 09/10/2019 0930   CL 105 01/10/2017 0000   CO2 27 09/10/2019 0930   CO2 31 01/04/2018 0000   GLUCOSE 100 (H) 09/10/2019 0930   BUN 10 09/10/2019 0930   BUN 15 11/20/2018 0000   CREATININE 0.77 09/10/2019 0930   CREATININE 0.79 01/12/2018 1153   CALCIUM 8.5 (L) 09/10/2019 0930   CALCIUM 8.7 06/28/2017 0000   GFRNONAA >60 09/10/2019 0930   GFRNONAA 94 01/12/2018 1153   GFRAA >60 09/10/2019 0930   GFRAA 108 01/12/2018 1153    BNP No results found for: BNP  ProBNP No results found for: PROBNP  Imaging: No results  found.   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 No significant respiratory symptoms.  - HRCT in July 2020 showed minimal fibrosis in indeterminate for UIP pattern. - Needs repeat PFTs and CT imaging to assess for stability  - FU in 6 months with Dr. Marchelle Gearing or Mannam (former [place])  GERD (gastroesophageal reflux disease) - Refilling Prilosec 40mg  daily  Obstructive sleep apnea - Maintained on CPAP therapy, managed by Dr. [facility]    Deboraha Sprang, NP 01/09/2020

## 2020-01-09 NOTE — Progress Notes (Signed)
Reviewed and agree with assessment/plan.   Maridel Pixler, MD [facility] 01/09/2020, 5:50 PM Pager:  [phone]  

## 2020-01-09 NOTE — Patient Instructions (Addendum)
Nice seeing you today Gregory West  Orders: - HRCT first available re: ILD  - PFTs with [facility] in 6 months   Recommendations: - Continue to wear CPAP every night for 4-6 hours or more - Do not drive if experiencing excessive daytime fatigue or somnolence - If you develop increased shortness of breath or wheezing let us know and we can send in albuterol rescue inhaler to use as needed   Rx: - Refilling Prilosec for GERD symptoms   Follow-up: - 6 months NEW pulmonary provider (recommend Ramaswamy or Mannam- 30 min slot)

## 2020-01-09 NOTE — Assessment & Plan Note (Addendum)
-   Maintained on CPAP therapy, managed by Dr. Earl [facility]

## 2020-01-10 ENCOUNTER — Other Ambulatory Visit: Payer: Self-pay | Admitting: Family Medicine

## 2020-01-15 ENCOUNTER — Ambulatory Visit (INDEPENDENT_AMBULATORY_CARE_PROVIDER_SITE_OTHER): Payer: Medicare Other

## 2020-01-15 ENCOUNTER — Other Ambulatory Visit: Payer: Self-pay

## 2020-01-15 DIAGNOSIS — J849 Interstitial pulmonary disease, unspecified: Secondary | ICD-10-CM

## 2020-01-15 DIAGNOSIS — M47814 Spondylosis without myelopathy or radiculopathy, thoracic region: Secondary | ICD-10-CM | POA: Diagnosis not present

## 2020-01-15 DIAGNOSIS — R918 Other nonspecific abnormal finding of lung field: Secondary | ICD-10-CM | POA: Diagnosis not present

## 2020-01-15 DIAGNOSIS — J84112 Idiopathic pulmonary fibrosis: Secondary | ICD-10-CM | POA: Diagnosis not present

## 2020-01-15 DIAGNOSIS — I700 Atherosclerosis of aorta: Secondary | ICD-10-CM | POA: Diagnosis not present

## 2020-01-21 NOTE — Progress Notes (Signed)
Please let patient know HRCT was stable. There was no evidence of progression of fibrosis. Continue to follow if office as recommended.

## 2020-01-22 ENCOUNTER — Telehealth: Payer: Self-pay | Admitting: Primary Care

## 2020-01-22 NOTE — Telephone Encounter (Signed)
Gregory Bayley, NP  01/21/2020 9:26 AM EDT     Please let patient know HRCT was stable. There was no evidence of progression of fibrosis. Continue to follow if office as recommended.    Spoke with pt and notified of results per Gregory Manis, NP. Pt verbalized understanding and denied any questions.

## 2020-01-22 NOTE — Progress Notes (Signed)
I spoke with the pt and notified of these results.

## 2020-01-28 ENCOUNTER — Other Ambulatory Visit: Payer: Self-pay

## 2020-01-28 ENCOUNTER — Ambulatory Visit (INDEPENDENT_AMBULATORY_CARE_PROVIDER_SITE_OTHER): Payer: Medicare Other | Admitting: Pulmonary Disease

## 2020-01-28 DIAGNOSIS — J849 Interstitial pulmonary disease, unspecified: Secondary | ICD-10-CM

## 2020-01-28 LAB — PULMONARY FUNCTION TEST
DL/VA % pred: 126 %
DL/VA: 5.17 ml/min/mmHg/L
DLCO cor % pred: 87 %
DLCO cor: 23.09 ml/min/mmHg
DLCO unc % pred: 87 %
DLCO unc: 23.09 ml/min/mmHg
FEF 25-75 Post: 2.03 L/sec
FEF 25-75 Pre: 3.38 L/sec
FEF2575-%Change-Post: -39 %
FEF2575-%Pred-Post: 79 %
FEF2575-%Pred-Pre: 131 %
FEV1-%Change-Post: -9 %
FEV1-%Pred-Post: 76 %
FEV1-%Pred-Pre: 85 %
FEV1-Post: 2.26 L
FEV1-Pre: 2.51 L
FEV1FVC-%Change-Post: -11 %
FEV1FVC-%Pred-Pre: 112 %
FEV6-%Change-Post: 2 %
FEV6-%Pred-Post: 79 %
FEV6-%Pred-Pre: 78 %
FEV6-Post: 2.96 L
FEV6-Pre: 2.9 L
FEV6FVC-%Pred-Post: 104 %
FEV6FVC-%Pred-Pre: 104 %
FVC-%Change-Post: 1 %
FVC-%Pred-Post: 76 %
FVC-%Pred-Pre: 74 %
FVC-Post: 2.96 L
FVC-Pre: 2.9 L
Post FEV1/FVC ratio: 77 %
Post FEV6/FVC ratio: 100 %
Pre FEV1/FVC ratio: 86 %
Pre FEV6/FVC Ratio: 100 %
RV % pred: 68 %
RV: 1.64 L
TLC % pred: 65 %
TLC: 4.59 L

## 2020-01-28 NOTE — Progress Notes (Signed)
Full PFT performed today. °

## 2020-01-31 NOTE — Progress Notes (Signed)
Pulmonary function testing is stable. Follow up in 6 months

## 2020-01-31 NOTE — Progress Notes (Signed)
Called and spoke with patient about PFT result per E. Clent Ridges NP. All questions answered and patient expressed full understanding. Recall put in for 6 month follow up appointment.

## 2020-02-01 ENCOUNTER — Other Ambulatory Visit: Payer: Self-pay

## 2020-02-01 ENCOUNTER — Encounter: Payer: Self-pay | Admitting: Family Medicine

## 2020-02-01 ENCOUNTER — Ambulatory Visit (INDEPENDENT_AMBULATORY_CARE_PROVIDER_SITE_OTHER): Payer: Medicare Other | Admitting: Family Medicine

## 2020-02-01 VITALS — BP 132/76 | HR 64 | Temp 98.0°F | Wt 280.1 lb

## 2020-02-01 DIAGNOSIS — I10 Essential (primary) hypertension: Secondary | ICD-10-CM

## 2020-02-01 DIAGNOSIS — E118 Type 2 diabetes mellitus with unspecified complications: Secondary | ICD-10-CM

## 2020-02-01 DIAGNOSIS — R21 Rash and other nonspecific skin eruption: Secondary | ICD-10-CM

## 2020-02-01 DIAGNOSIS — I152 Hypertension secondary to endocrine disorders: Secondary | ICD-10-CM

## 2020-02-01 DIAGNOSIS — Z23 Encounter for immunization: Secondary | ICD-10-CM

## 2020-02-01 DIAGNOSIS — E782 Mixed hyperlipidemia: Secondary | ICD-10-CM

## 2020-02-01 DIAGNOSIS — N401 Enlarged prostate with lower urinary tract symptoms: Secondary | ICD-10-CM | POA: Diagnosis not present

## 2020-02-01 DIAGNOSIS — E1159 Type 2 diabetes mellitus with other circulatory complications: Secondary | ICD-10-CM

## 2020-02-01 DIAGNOSIS — R350 Frequency of micturition: Secondary | ICD-10-CM | POA: Diagnosis not present

## 2020-02-01 NOTE — Patient Instructions (Signed)
Great to see you! I have entered referral to dermatology, you will be contacted with appt.  See me again in 6 months.

## 2020-02-03 NOTE — Progress Notes (Signed)
Gregory Ready [name] - 69 y.o. male MRN [id number]  Date of birth: Dec 05, 1950  Subjective Chief Complaint  Patient presents with  . Follow-up    HPI Gregory BOOMER [name] is a 69 y.o. male here today for follow up visit.  He has a history of HTN, T2DM, ILD, HLD and hypothyroidism.    He is seen by primary care through the [place].   He sees endocrinology through [facility].  Recent A1c of 8.2%.  He reports compliance with current medications. Admits that he needs to work on dietary changes.   They are also following his thyroid as well.  He had eye exam completed through the [place].  HTN is currently managed with losartan and propranolol.  He has this filled through the [place].  He denies symptoms related to  HTN including chest pain, shortness of breath, palpitations, headache or vision changes.   He is seen by urology for BPH.  Symptoms currently managed with flomax.    HE has had recurrent rash on bilateral leg for several months.  Tried steroid cream without improvement.  Requesting referral to dermatology.   ROS:  A comprehensive ROS was completed and negative except as noted per HPI  ROS:  A comprehensive ROS was completed and negative except as noted per HPI     Allergies  Allergen Reactions  . Statins     Muscle cramping all over body. Lipitor, Crestor, and pravastatin.  Verdis Prime [Liraglutide] Other (See Comments)    Caused pancreatitis    Past Medical History:  Diagnosis Date  . Arthritis   . Chest pain    a. Normal cors 2001. b. Neg stress test 2012, 05/2014.  . Diabetes mellitus without complication (HCC)   . GERD (gastroesophageal reflux disease)   . Hyperlipidemia   . Hypertension   . Hypertensive response to exercise   . Hypothyroidism   . Obesity   . Obstructive sleep apnea    Currently untreated   . PONV (postoperative nausea and vomiting)     Past Surgical History:  Procedure Laterality Date  . CARDIAC CATHETERIZATION    . CERVICAL FUSION    .  HERNIA REPAIR    . KNEE ARTHROSCOPY  1984   both knee  . SHOULDER ARTHROSCOPY WITH SUBACROMIAL DECOMPRESSION Right 09/13/2019   Procedure: RIGHT SHOULDER ARTHROSCOPY WITH EXTENSIVE DEBRIDEMENT, SUBACROMIAL DECOMPRESSION,;  Surgeon: Kathryne Hitch, MD;  Location: [facility];  Service: Orthopedics;  Laterality: Right;    Social History   Socioeconomic History  . Marital status: Married    Spouse name: Not on file  . Number of children: Not on file  . Years of education: Not on file  . Highest education level: Not on file  Occupational History  . Not on file  Tobacco Use  . Smoking status: Former Smoker    Packs/day: 1.50    Years: 30.00    Pack years: 45.00    Types: Cigarettes    Quit date: 1990    Years since quitting: 31.6  . Smokeless tobacco: Never Used  Vaping Use  . Vaping Use: Never used  Substance and Sexual Activity  . Alcohol use: No  . Drug use: No  . Sexual activity: Not on file  Other Topics Concern  . Not on file  Social History Narrative  . Not on file   Social Determinants of Health   Financial Resource Strain:   . Difficulty of Paying Living Expenses: Not on file  Food  Insecurity:   . Worried About Programme researcher, broadcasting/film/video in the Last Year: Not on file  . Ran Out of Food in the Last Year: Not on file  Transportation Needs:   . Lack of Transportation (Medical): Not on file  . Lack of Transportation (Non-Medical): Not on file  Physical Activity:   . Days of Exercise per Week: Not on file  . Minutes of Exercise per Session: Not on file  Stress:   . Feeling of Stress : Not on file  Social Connections:   . Frequency of Communication with Friends and Family: Not on file  . Frequency of Social Gatherings with Friends and Family: Not on file  . Attends Religious Services: Not on file  . Active Member of Clubs or Organizations: Not on file  . Attends Banker Meetings: Not on file  . Marital Status: Not on file     Family History  Problem Relation Age of Onset  . Diabetes Mother   . Heart disease Mother   . Hypertension Mother   . Stroke Mother   . Heart attack Mother   . Cancer Father   . Heart disease Father   . Hyperlipidemia Sister   . Hypertension Sister   . Diabetes Maternal Grandmother   . Stroke Maternal Grandmother   . Heart attack Maternal Grandmother   . Diabetes Maternal Grandfather   . Hypertension Maternal Grandfather   . Heart attack Maternal Grandfather   . Diabetes Sister   . Hypertension Sister   . Heart attack Paternal Grandmother   . Heart attack Paternal Grandfather     Health Maintenance  Topic Date Due  . OPHTHALMOLOGY EXAM  01/19/2018  . HEMOGLOBIN A1C  02/02/2020  . FOOT EXAM  03/12/2020  . PNA vac Low Risk Adult (2 of 2 - PPSV23) 07/30/2020  . TETANUS/TDAP  07/30/2025  . COLONOSCOPY  02/23/2026  . INFLUENZA VACCINE  Completed  . COVID-19 Vaccine  Completed  . Hepatitis C Screening  Completed     ----------------------------------------------------------------------------------------------------------------------------------------------------------------------------------------------------------------- Physical Exam BP 132/76   Pulse 64   Temp 98 F (36.7 C) (Oral)   Wt 280 lb 1.3 oz (127 kg)   BMI 40.19 kg/m   Physical Exam Constitutional:      Appearance: Normal appearance.  Eyes:     General: No scleral icterus. Cardiovascular:     Rate and Rhythm: Normal rate and regular rhythm.  Pulmonary:     Effort: Pulmonary effort is normal.     Breath sounds: Normal breath sounds.  Musculoskeletal:     Cervical back: Neck supple.  Skin:    General: Skin is warm and dry.  Neurological:     General: No focal deficit present.     Mental Status: He is alert.      ------------------------------------------------------------------------------------------------------------------------------------------------------------------------------------------------------------------- Assessment and Plan  Hypertension associated with diabetes (HCC) Blood pressure is at goal at for age and co-morbidities.  I recommend he continue losartan and propranolol.  He will continue to obtain these prescriptions through the [place].  In addition they were instructed to follow a low sodium diet with regular exercise to help to maintain adequate control of blood pressure.    Type II diabetes mellitus with complication He sees endocrinology through [facility].  Recent a1c elevated. Encouraged to work on lifestyle change. I ask him to obtain copy of his eye report from the [place].   BPH (benign prostatic hyperplasia) Stable at this time.  Symptoms controlled with flomax.  Hyperlipidemia Tolerating crestor well.  Managed through [place].    No orders of the defined types were placed in this encounter.   Return in about 6 months (around 07/31/2020) for HTN.    This visit occurred during the SARS-CoV-2 public health emergency.  Safety protocols were in place, including screening questions prior to the visit, additional usage of staff PPE, and extensive cleaning of exam room while observing appropriate contact time as indicated for disinfecting solutions.

## 2020-02-03 NOTE — Assessment & Plan Note (Signed)
Tolerating crestor well.  Managed through [place].

## 2020-02-03 NOTE — Assessment & Plan Note (Signed)
Stable at this time.  Symptoms controlled with flomax.

## 2020-02-03 NOTE — Assessment & Plan Note (Signed)
He sees endocrinology through [facility].  Recent a1c elevated. Encouraged to work on lifestyle change. I ask him to obtain copy of his eye report from the [place].

## 2020-02-03 NOTE — Assessment & Plan Note (Signed)
Blood pressure is at goal at for age and co-morbidities.  I recommend he continue losartan and propranolol.  He will continue to obtain these prescriptions through the [place].  In addition they were instructed to follow a low sodium diet with regular exercise to help to maintain adequate control of blood pressure.

## 2020-02-14 DIAGNOSIS — I8310 Varicose veins of unspecified lower extremity with inflammation: Secondary | ICD-10-CM | POA: Diagnosis not present

## 2020-02-17 ENCOUNTER — Other Ambulatory Visit: Payer: Self-pay | Admitting: Family Medicine

## 2020-03-11 DIAGNOSIS — E1142 Type 2 diabetes mellitus with diabetic polyneuropathy: Secondary | ICD-10-CM | POA: Diagnosis not present

## 2020-03-11 DIAGNOSIS — L84 Corns and callosities: Secondary | ICD-10-CM | POA: Diagnosis not present

## 2020-03-11 DIAGNOSIS — B351 Tinea unguium: Secondary | ICD-10-CM | POA: Diagnosis not present

## 2020-05-04 ENCOUNTER — Encounter: Payer: Self-pay | Admitting: Family Medicine

## 2020-05-05 NOTE — Telephone Encounter (Signed)
Chart updated

## 2020-05-07 NOTE — Progress Notes (Signed)
Cardiology Office Note:    Date:  05/09/2020   ID:  Gregory Ready [name], DOB 12-31-1950, MRN [id number]  PCP:  [name], DO  Cardiologist:  Lesleigh Noe, MD   Referring MD: [name], DO   Chief Complaint  Patient presents with  . Chest Pain    History of Present Illness:    Gregory West [name] is a 69 y.o. male with a hx of DM II, OSA, HLD, obesity, prior hypertensive response to exercise, and chest pain with prior negative cardiac workup who presents for f/udue to concerns about left parasternal chest pain.  Burning chest discomfort that can last for hours.  Happening daily since October.  Does not sleep well.  No exertional discomfort.  Negative cardiac work-up earlier this year.  Past Medical History:  Diagnosis Date  . Arthritis   . Chest pain    a. Normal cors 2001. b. Neg stress test 2012, 05/2014.  . Diabetes mellitus without complication (HCC)   . GERD (gastroesophageal reflux disease)   . Hyperlipidemia   . Hypertension   . Hypertensive response to exercise   . Hypothyroidism   . Obesity   . Obstructive sleep apnea    Currently untreated   . PONV (postoperative nausea and vomiting)     Past Surgical History:  Procedure Laterality Date  . CARDIAC CATHETERIZATION    . CERVICAL FUSION    . HERNIA REPAIR    . KNEE ARTHROSCOPY  1984   both knee  . SHOULDER ARTHROSCOPY WITH SUBACROMIAL DECOMPRESSION Right 09/13/2019   Procedure: RIGHT SHOULDER ARTHROSCOPY WITH EXTENSIVE DEBRIDEMENT, SUBACROMIAL DECOMPRESSION,;  Surgeon: Kathryne Hitch, MD;  Location: [facility];  Service: Orthopedics;  Laterality: Right;    Current Medications: Current Meds  Medication Sig  . aspirin 81 MG tablet Take 81 mg by mouth daily.  . Cholecalciferol (VITAMIN D3) 5000 units CAPS Take 1 capsule by mouth daily.  . empagliflozin (JARDIANCE) 25 MG TABS tablet Take 25 mg by mouth daily.  Marland Kitchen ezetimibe (ZETIA) 10 MG tablet Take 1 tablet (10  mg total) by mouth daily.  . furosemide (LASIX) 20 MG tablet Take 20 mg by mouth daily.  . insulin glargine (LANTUS) 100 UNIT/ML injection Inject 40 Units into the skin 2 (two) times daily.  Marland Kitchen levothyroxine (SYNTHROID) 25 MCG tablet Take 25 mcg by mouth daily before breakfast.  . losartan (COZAAR) 25 MG tablet Take 1 tablet (25 mg total) by mouth daily.  . metFORMIN (GLUCOPHAGE-XR) 750 MG 24 hr tablet TAKE 1 TABLET BY MOUTH TWICE DAILY BEFORE A MEAL.  Marland Kitchen Omega-3 Fatty Acids (FISH OIL) 1000 MG CAPS Take by mouth.  Marland Kitchen omeprazole (PRILOSEC) 40 MG capsule Take 1 capsule (40 mg total) by mouth daily.  Letta Pate VERIO test strip 1 each by Other route as needed.   Marland Kitchen oxyCODONE (ROXICODONE) 5 MG immediate release tablet Take 1-2 tablets (5-10 mg total) by mouth every 4 (four) hours as needed.  . potassium chloride (MICRO-K) 10 MEQ CR capsule TAKE ONE CAPSULE BY MOUTH DAILY AS NEEDED -TAKE WITH LASIX  . POTASSIUM PO Take 10 mEq by mouth.  . propranolol ER (INDERAL LA) 60 MG 24 hr capsule TAKE ONE CAPSULE BY MOUTH DAILY  . rosuvastatin (CRESTOR) 5 MG tablet Take 1 tablet by mouth daily.  . tamsulosin (FLOMAX) 0.4 MG CAPS capsule TAKE ONE CAPSULE BY MOUTH DAILY  . traZODone (DESYREL) 50 MG tablet Take 0.5-1 tablets (25-50 mg total) by mouth at  bedtime as needed. for sleep     Allergies:   Statins and Victoza [liraglutide]   Social History   Socioeconomic History  . Marital status: Married    Spouse name: Not on file  . Number of children: Not on file  . Years of education: Not on file  . Highest education level: Not on file  Occupational History  . Not on file  Tobacco Use  . Smoking status: Former Smoker    Packs/day: 1.50    Years: 30.00    Pack years: 45.00    Types: Cigarettes    Quit date: 1990    Years since quitting: 31.9  . Smokeless tobacco: Never Used  Vaping Use  . Vaping Use: Never used  Substance and Sexual Activity  . Alcohol use: No  . Drug use: No  . Sexual activity:  Not on file  Other Topics Concern  . Not on file  Social History Narrative  . Not on file   Social Determinants of Health   Financial Resource Strain: Not on file  Food Insecurity: Not on file  Transportation Needs: Not on file  Physical Activity: Not on file  Stress: Not on file  Social Connections: Not on file     Family History: The patient's family history includes Cancer in his father; Diabetes in his maternal grandfather, maternal grandmother, mother, and sister; Heart attack in his maternal grandfather, maternal grandmother, mother, paternal grandfather, and paternal grandmother; Heart disease in his father and mother; Hyperlipidemia in his sister; Hypertension in his maternal grandfather, mother, sister, and sister; Stroke in his maternal grandmother and mother.  ROS:   Please see the history of present illness.    Under emotional stress due to many issues in his life.  All other systems reviewed and are negative.  EKGs/Labs/Other Studies Reviewed:    The following studies were reviewed today:  COR CTA 07/05/2019: IMPRESSION: 1. Calcium score 0  2.  Normal co dominant coronary arteries  3.  Aortic root 3.8 cm   EKG:  EKG normal sinus rhythm, normal PR interval. Normal EKG.  Recent Labs: 07/04/2019: ALT 18 09/10/2019: BUN 10; Creatinine, Ser 0.77; Potassium 4.6; Sodium 138  Recent Lipid Panel    Component Value Date/Time   CHOL 121 07/04/2019 0857   TRIG 100 07/04/2019 0857   HDL 36 (L) 07/04/2019 0857   CHOLHDL 3.4 07/04/2019 0857   VLDL 22 04/01/2015 0000   LDLCALC 66 07/04/2019 0857    Physical Exam:    VS:  BP 116/68   Pulse 69   Ht 5\' 10"  (1.778 m)   Wt 281 lb (127.5 kg)   SpO2 98%   BMI 40.32 kg/m     Wt Readings from Last 3 Encounters:  05/09/20 281 lb (127.5 kg)  02/01/20 280 lb 1.3 oz (127 kg)  01/09/20 280 lb (127 kg)     GEN: Obese. No acute distress HEENT: Normal NECK: No JVD. LYMPHATICS: No lymphadenopathy CARDIAC:  RRR  without murmur, gallop, or edema. VASCULAR:  Normal Pulses. No bruits. RESPIRATORY:  Clear to auscultation without rales, wheezing or rhonchi  ABDOMEN: Soft, non-tender, non-distended, No pulsatile mass, MUSCULOSKELETAL: No deformity  SKIN: Warm and dry NEUROLOGIC:  Alert and oriented x 3 PSYCHIATRIC:  Normal affect   ASSESSMENT:    1. Chest pain, unspecified type   2. Mixed hyperlipidemia   3. Hypertension associated with diabetes (HCC)   4. Type II diabetes mellitus with complication (HCC)   5. Obstructive sleep apnea  6. Educated about COVID-19 virus infection    PLAN:    In order of problems listed above:  1. Not felt to be related to myocardial ischemia.  Needs to have a GI work-up to exclude reflux. 2. LDL target less than 70 given known diabetes.  Continue low-dose rosuvastatin. 3. Target blood pressure 130/80 is being achieved.  Low-salt diet. 4. A1c is not well controlled.  Decrease carbohydrate diet. 5. Urged compliance with CPAP. 6. Vaccinated and practicing social distancing.  Clinical follow-up in 1 to 2 years or if there is a change in quality of discomfort.   Medication Adjustments/Labs and Tests Ordered: Current medicines are reviewed at length with the patient today.  Concerns regarding medicines are outlined above.  Orders Placed This Encounter  Procedures  . EKG 12-Lead   No orders of the defined types were placed in this encounter.   Patient Instructions  Medication Instructions:  Your physician recommends that you continue on your current medications as directed. Please refer to the Current Medication list given to you today.  *If you need a refill on your cardiac medications before your next appointment, please call your pharmacy*   Lab Work: None If you have labs (blood work) drawn today and your tests are completely normal, you will receive your results only by: Marland Kitchen MyChart Message (if you have MyChart) OR . A paper copy in the mail If you  have any lab test that is abnormal or we need to change your treatment, we will call you to review the results.   Testing/Procedures: None   Follow-Up: At [facility], you and your health needs are our priority.  As part of our continuing mission to provide you with exceptional heart care, we have created designated Provider Care Teams.  These Care Teams include your primary Cardiologist (physician) and Advanced Practice Providers (APPs -  Physician Assistants and Nurse Practitioners) who all work together to provide you with the care you need, when you need it.  We recommend signing up for the patient portal called "MyChart".  Sign up information is provided on this After Visit Summary.  MyChart is used to connect with patients for Virtual Visits (Telemedicine).  Patients are able to view lab/test results, encounter notes, upcoming appointments, etc.  Non-urgent messages can be sent to your provider as well.   To learn more about what you can do with MyChart, go to [email or web address].    Your next appointment:   1 year(s)  The format for your next appointment:   In Person  Provider:   You may see Lesleigh Noe, MD or one of the following Advanced Practice Providers on your designated Care Team:    Norma Fredrickson, NP  Nada Boozer, NP  Georgie Chard, NP    Other Instructions      Signed, Lesleigh Noe, MD  05/09/2020 10:13 AM    [facility]

## 2020-05-09 ENCOUNTER — Ambulatory Visit (INDEPENDENT_AMBULATORY_CARE_PROVIDER_SITE_OTHER): Payer: Medicare Other | Admitting: Interventional Cardiology

## 2020-05-09 ENCOUNTER — Encounter: Payer: Self-pay | Admitting: Interventional Cardiology

## 2020-05-09 ENCOUNTER — Other Ambulatory Visit: Payer: Self-pay

## 2020-05-09 VITALS — BP 116/68 | HR 69 | Ht 70.0 in | Wt 281.0 lb

## 2020-05-09 DIAGNOSIS — R079 Chest pain, unspecified: Secondary | ICD-10-CM

## 2020-05-09 DIAGNOSIS — E1159 Type 2 diabetes mellitus with other circulatory complications: Secondary | ICD-10-CM

## 2020-05-09 DIAGNOSIS — I152 Hypertension secondary to endocrine disorders: Secondary | ICD-10-CM

## 2020-05-09 DIAGNOSIS — E782 Mixed hyperlipidemia: Secondary | ICD-10-CM

## 2020-05-09 DIAGNOSIS — Z7189 Other specified counseling: Secondary | ICD-10-CM | POA: Diagnosis not present

## 2020-05-09 DIAGNOSIS — E118 Type 2 diabetes mellitus with unspecified complications: Secondary | ICD-10-CM

## 2020-05-09 DIAGNOSIS — G4733 Obstructive sleep apnea (adult) (pediatric): Secondary | ICD-10-CM

## 2020-05-09 NOTE — Patient Instructions (Signed)
Medication Instructions:  Your physician recommends that you continue on your current medications as directed. Please refer to the Current Medication list given to you today.  *If you need a refill on your cardiac medications before your next appointment, please call your pharmacy*   Lab Work: None If you have labs (blood work) drawn today and your tests are completely normal, you will receive your results only by: . MyChart Message (if you have MyChart) OR . A paper copy in the mail If you have any lab test that is abnormal or we need to change your treatment, we will call you to review the results.   Testing/Procedures: None   Follow-Up: At [facility], you and your health needs are our priority.  As part of our continuing mission to provide you with exceptional heart care, we have created designated Provider Care Teams.  These Care Teams include your primary Cardiologist (physician) and Advanced Practice Providers (APPs -  Physician Assistants and Nurse Practitioners) who all work together to provide you with the care you need, when you need it.  We recommend signing up for the patient portal called "MyChart".  Sign up information is provided on this After Visit Summary.  MyChart is used to connect with patients for Virtual Visits (Telemedicine).  Patients are able to view lab/test results, encounter notes, upcoming appointments, etc.  Non-urgent messages can be sent to your provider as well.   To learn more about what you can do with MyChart, go to [email or web address].    Your next appointment:   1 year(s)  The format for your next appointment:   In Person  Provider:   You may see Henry W Smith III, MD or one of the following Advanced Practice Providers on your designated Care Team:    Lori Gerhardt, NP  Laura Ingold, NP  Jill McDaniel, NP    Other Instructions   

## 2020-05-13 ENCOUNTER — Encounter: Payer: Self-pay | Admitting: Family Medicine

## 2020-05-13 DIAGNOSIS — B351 Tinea unguium: Secondary | ICD-10-CM | POA: Diagnosis not present

## 2020-05-13 DIAGNOSIS — E1142 Type 2 diabetes mellitus with diabetic polyneuropathy: Secondary | ICD-10-CM | POA: Diagnosis not present

## 2020-05-13 DIAGNOSIS — L84 Corns and callosities: Secondary | ICD-10-CM | POA: Diagnosis not present

## 2020-05-13 MED ORDER — EZETIMIBE 10 MG PO TABS: 10.0000 mg | ORAL_TABLET | Freq: Every day | ORAL | 3 refills | Status: DC

## 2020-05-14 ENCOUNTER — Other Ambulatory Visit: Payer: Self-pay

## 2020-05-14 MED ORDER — TRAZODONE HCL 50 MG PO TABS: 25.0000 mg | ORAL_TABLET | Freq: Every evening | ORAL | 0 refills | Status: DC | PRN

## 2020-05-14 MED ORDER — OMEPRAZOLE 40 MG PO CPDR: 40.0000 mg | DELAYED_RELEASE_CAPSULE | Freq: Every day | ORAL | 0 refills | Status: DC

## 2020-05-14 MED ORDER — PROPRANOLOL HCL ER 60 MG PO CP24: 60.0000 mg | ORAL_CAPSULE | Freq: Every day | ORAL | 0 refills | Status: DC

## 2020-06-11 DIAGNOSIS — Z23 Encounter for immunization: Secondary | ICD-10-CM | POA: Diagnosis not present

## 2020-06-23 DIAGNOSIS — G4733 Obstructive sleep apnea (adult) (pediatric): Secondary | ICD-10-CM | POA: Diagnosis not present

## 2020-07-03 ENCOUNTER — Encounter: Payer: Self-pay | Admitting: Family Medicine

## 2020-07-03 ENCOUNTER — Other Ambulatory Visit: Payer: Self-pay | Admitting: *Deleted

## 2020-07-03 ENCOUNTER — Other Ambulatory Visit: Payer: Self-pay

## 2020-07-03 DIAGNOSIS — E1159 Type 2 diabetes mellitus with other circulatory complications: Secondary | ICD-10-CM

## 2020-07-03 DIAGNOSIS — N451 Epididymitis: Secondary | ICD-10-CM

## 2020-07-03 DIAGNOSIS — I152 Hypertension secondary to endocrine disorders: Secondary | ICD-10-CM

## 2020-07-03 DIAGNOSIS — E782 Mixed hyperlipidemia: Secondary | ICD-10-CM

## 2020-07-03 MED ORDER — METFORMIN HCL ER 750 MG PO TB24: ORAL_TABLET | ORAL | 1 refills | Status: DC

## 2020-07-03 MED ORDER — TAMSULOSIN HCL 0.4 MG PO CAPS: 0.4000 mg | ORAL_CAPSULE | Freq: Every day | ORAL | 1 refills | Status: DC

## 2020-07-09 ENCOUNTER — Other Ambulatory Visit: Payer: Medicare Other

## 2020-07-09 ENCOUNTER — Other Ambulatory Visit: Payer: Self-pay

## 2020-07-09 DIAGNOSIS — I152 Hypertension secondary to endocrine disorders: Secondary | ICD-10-CM | POA: Diagnosis not present

## 2020-07-09 DIAGNOSIS — E782 Mixed hyperlipidemia: Secondary | ICD-10-CM | POA: Diagnosis not present

## 2020-07-09 DIAGNOSIS — E1159 Type 2 diabetes mellitus with other circulatory complications: Secondary | ICD-10-CM | POA: Diagnosis not present

## 2020-07-10 LAB — BASIC METABOLIC PANEL
BUN/Creatinine Ratio: 11 (ref 10–24)
BUN: 9 mg/dL (ref 8–27)
CO2: 26 mmol/L (ref 20–29)
Calcium: 8.5 mg/dL — ABNORMAL LOW (ref 8.6–10.2)
Chloride: 103 mmol/L (ref 96–106)
Creatinine, Ser: 0.82 mg/dL (ref 0.76–1.27)
GFR calc Af Amer: 105 mL/min/{1.73_m2} (ref >59)
GFR calc non Af Amer: 91 mL/min/{1.73_m2} (ref >59)
Glucose: 110 mg/dL — ABNORMAL HIGH (ref 65–99)
Potassium: 4 mmol/L (ref 3.5–5.2)
Sodium: 143 mmol/L (ref 134–144)

## 2020-07-10 LAB — LIPID PANEL
Chol/HDL Ratio: 3.7 ratio (ref 0.0–5.0)
Cholesterol, Total: 125 mg/dL (ref 100–199)
HDL: 34 mg/dL — ABNORMAL LOW (ref >39)
LDL Chol Calc (NIH): 72 mg/dL (ref 0–99)
Triglycerides: 98 mg/dL (ref 0–149)
VLDL Cholesterol Cal: 19 mg/dL (ref 5–40)

## 2020-07-10 LAB — HEPATIC FUNCTION PANEL
ALT: 19 IU/L (ref 0–44)
AST: 17 IU/L (ref 0–40)
Albumin: 4.1 g/dL (ref 3.8–4.8)
Alkaline Phosphatase: 84 IU/L (ref 44–121)
Bilirubin Total: 0.3 mg/dL (ref 0.0–1.2)
Bilirubin, Direct: <0.1 mg/dL (ref 0.00–0.40)
Total Protein: 6.9 g/dL (ref 6.0–8.5)

## 2020-07-24 ENCOUNTER — Other Ambulatory Visit: Payer: Self-pay | Admitting: Family Medicine

## 2020-07-25 DIAGNOSIS — E1142 Type 2 diabetes mellitus with diabetic polyneuropathy: Secondary | ICD-10-CM | POA: Diagnosis not present

## 2020-07-25 DIAGNOSIS — L84 Corns and callosities: Secondary | ICD-10-CM | POA: Diagnosis not present

## 2020-07-25 DIAGNOSIS — B351 Tinea unguium: Secondary | ICD-10-CM | POA: Diagnosis not present

## 2020-07-28 ENCOUNTER — Other Ambulatory Visit: Payer: Self-pay | Admitting: Family Medicine

## 2020-07-31 ENCOUNTER — Ambulatory Visit (INDEPENDENT_AMBULATORY_CARE_PROVIDER_SITE_OTHER): Payer: Medicare Other | Admitting: Family Medicine

## 2020-07-31 ENCOUNTER — Other Ambulatory Visit: Payer: Self-pay

## 2020-07-31 ENCOUNTER — Encounter: Payer: Self-pay | Admitting: Family Medicine

## 2020-07-31 DIAGNOSIS — I714 Abdominal aortic aneurysm, without rupture, unspecified: Secondary | ICD-10-CM | POA: Insufficient documentation

## 2020-07-31 DIAGNOSIS — I152 Hypertension secondary to endocrine disorders: Secondary | ICD-10-CM

## 2020-07-31 DIAGNOSIS — J849 Interstitial pulmonary disease, unspecified: Secondary | ICD-10-CM | POA: Diagnosis not present

## 2020-07-31 DIAGNOSIS — E1159 Type 2 diabetes mellitus with other circulatory complications: Secondary | ICD-10-CM

## 2020-07-31 DIAGNOSIS — G250 Essential tremor: Secondary | ICD-10-CM | POA: Diagnosis not present

## 2020-07-31 DIAGNOSIS — E118 Type 2 diabetes mellitus with unspecified complications: Secondary | ICD-10-CM | POA: Diagnosis not present

## 2020-07-31 DIAGNOSIS — F5101 Primary insomnia: Secondary | ICD-10-CM

## 2020-07-31 DIAGNOSIS — E1142 Type 2 diabetes mellitus with diabetic polyneuropathy: Secondary | ICD-10-CM

## 2020-07-31 DIAGNOSIS — E1140 Type 2 diabetes mellitus with diabetic neuropathy, unspecified: Secondary | ICD-10-CM | POA: Insufficient documentation

## 2020-07-31 DIAGNOSIS — G4700 Insomnia, unspecified: Secondary | ICD-10-CM | POA: Insufficient documentation

## 2020-07-31 NOTE — Assessment & Plan Note (Signed)
Doing well with trazodone, continue.  

## 2020-07-31 NOTE — Progress Notes (Signed)
Gregory Ready [name] - 70 y.o. male MRN [id number]  Date of birth: 07-29-1950  Subjective Chief Complaint  Patient presents with   Hypertension    HPI Gregory KOKESH [name] is a 70 y.o. male with history of HTN, T2DM with neuropathy, glaucoma, hypothyroidism and essential tremor here today for follow up.   He continues to see endocrinology for management of diabetes and hypothyroidism. Last a1c in 8/21 was 8.2%.  He is checking blood sugars at home.  No episodes of hypoglycemia.  He is also seeing podiatry for foot care.  Recently started on b vitamin supplement.  He sees ophthalmology through the [place].  He is being treated for glaucoma.   HTN is managed with losartan and furosemide.  He is also taking propranolol for essential tremor as well. This has remained well controlled with current medications. He denies side effects or symptoms related to HTN including chest pain, shortness of breath, palpitations, headache or vision changes.  He does also see cardiology.   Continues to see pulmonology for ILD.  Denies increased shortness of breath.   Insomnia remains well controlled with trazodone.   ROS:  A comprehensive ROS was completed and negative except as noted per HPI       Allergies  Allergen Reactions   Statins     Muscle cramping all over body. Lipitor, Crestor, and pravastatin.   Lisinopril Cough   Victoza [Liraglutide] Other (See Comments)    Caused pancreatitis    Past Medical History:  Diagnosis Date   Arthritis    Chest pain    a. Normal cors 2001. b. Neg stress test 2012, 05/2014.   Diabetes mellitus without complication (HCC)    GERD (gastroesophageal reflux disease)    Hyperlipidemia    Hypertension    Hypertensive response to exercise    Hypothyroidism    Obesity    Obstructive sleep apnea    Currently untreated    PONV (postoperative nausea and vomiting)     Past Surgical History:  Procedure Laterality Date   CARDIAC  CATHETERIZATION     CERVICAL FUSION     HERNIA REPAIR     KNEE ARTHROSCOPY  1984   both knee   SHOULDER ARTHROSCOPY WITH SUBACROMIAL DECOMPRESSION Right 09/13/2019   Procedure: RIGHT SHOULDER ARTHROSCOPY WITH EXTENSIVE DEBRIDEMENT, SUBACROMIAL DECOMPRESSION,;  Surgeon: Kathryne Hitch, MD;  Location: [facility];  Service: Orthopedics;  Laterality: Right;    Social History   Socioeconomic History   Marital status: Married    Spouse name: Not on file   Number of children: Not on file   Years of education: Not on file   Highest education level: Not on file  Occupational History   Not on file  Tobacco Use   Smoking status: Former Smoker    Packs/day: 1.50    Years: 30.00    Pack years: 45.00    Types: Cigarettes    Quit date: 1990    Years since quitting: 32.1   Smokeless tobacco: Never Used  Building services engineer Use: Never used  Substance and Sexual Activity   Alcohol use: No   Drug use: No   Sexual activity: Not on file  Other Topics Concern   Not on file  Social History Narrative   Not on file   Social Determinants of Health   Financial Resource Strain: Not on file  Food Insecurity: Not on file  Transportation Needs: Not on file  Physical Activity: Not on file  Stress: Not on file  Social Connections: Not on file    Family History  Problem Relation Age of Onset   Diabetes Mother    Heart disease Mother    Hypertension Mother    Stroke Mother    Heart attack Mother    Cancer Father    Heart disease Father    Hyperlipidemia Sister    Hypertension Sister    Diabetes Maternal Grandmother    Stroke Maternal Grandmother    Heart attack Maternal Grandmother    Diabetes Maternal Grandfather    Hypertension Maternal Grandfather    Heart attack Maternal Grandfather    Diabetes Sister    Hypertension Sister    Heart attack Paternal Grandmother    Heart attack Paternal Grandfather     Health  Maintenance  Topic Date Due   OPHTHALMOLOGY EXAM  01/19/2018   COVID-19 Vaccine (4 - Booster) 03/01/2020   HEMOGLOBIN A1C  07/04/2020   FOOT EXAM  07/25/2021   TETANUS/TDAP  07/30/2025   COLONOSCOPY (Pts 45-82yrs Insurance coverage will need to be confirmed)  02/23/2026   INFLUENZA VACCINE  Completed   Hepatitis C Screening  Completed   PNA vac Low Risk Adult  Completed   HPV VACCINES  Aged Out     ----------------------------------------------------------------------------------------------------------------------------------------------------------------------------------------------------------------- Physical Exam BP 128/68 (BP Location: Left Arm, Patient Position: Sitting, Cuff Size: Large)    Pulse 70    Wt 277 lb 1.6 oz (125.7 kg)    SpO2 94%    BMI 39.76 kg/m   Physical Exam Constitutional:      Appearance: Normal appearance.  HENT:     Head: Normocephalic and atraumatic.  Cardiovascular:     Rate and Rhythm: Normal rate and regular rhythm.  Pulmonary:     Effort: Pulmonary effort is normal.     Breath sounds: Normal breath sounds.  Neurological:     General: No focal deficit present.     Mental Status: He is alert.  Psychiatric:        Mood and Affect: Mood normal.        Behavior: Behavior normal.     ------------------------------------------------------------------------------------------------------------------------------------------------------------------------------------------------------------------- Assessment and Plan  Hypertension associated with diabetes (HCC) Blood pressure is at goal at for age and co-morbidities.  I recommend continuation of current medications.  In addition they were instructed to follow a low sodium diet with regular exercise to help to maintain adequate control of blood pressure.    Interstitial pulmonary disease (HCC) Managed by pulmonology.   No increased symptoms at this time.   Type II diabetes mellitus with  complication He has upcoming f/u with endocrinology. Encouraged compliance with medication and diet.  Has podiatrist and ophthalmologist as well.   Benign essential tremor Well managed with propranolol, continue.   Diabetic neuropathy (HCC) Improved with B12 supplement.   Insomnia Doing well with trazodone, continue.    No orders of the defined types were placed in this encounter.   Return in about 6 months (around 01/31/2021) for Insomnia/HTN.    This visit occurred during the SARS-CoV-2 public health emergency.  Safety protocols were in place, including screening questions prior to the visit, additional usage of staff PPE, and extensive cleaning of exam room while observing appropriate contact time as indicated for disinfecting solutions.

## 2020-07-31 NOTE — Assessment & Plan Note (Signed)
Managed by pulmonology.   No increased symptoms at this time.

## 2020-07-31 NOTE — Assessment & Plan Note (Signed)
Improved with B12 supplement.

## 2020-07-31 NOTE — Patient Instructions (Signed)
Great to see you today! Continue current medications and see me again in about 6 months.

## 2020-07-31 NOTE — Assessment & Plan Note (Signed)
Blood pressure is at goal at for age and co-morbidities.  I recommend continuation of current medications.  In addition they were instructed to follow a low sodium diet with regular exercise to help to maintain adequate control of blood pressure.  ? ?

## 2020-07-31 NOTE — Assessment & Plan Note (Signed)
Well managed with propranolol, continue.

## 2020-07-31 NOTE — Assessment & Plan Note (Signed)
He has upcoming f/u with endocrinology. Encouraged compliance with medication and diet.  Has podiatrist and ophthalmologist as well.

## 2020-09-08 DIAGNOSIS — E1165 Type 2 diabetes mellitus with hyperglycemia: Secondary | ICD-10-CM | POA: Diagnosis not present

## 2020-09-08 DIAGNOSIS — I10 Essential (primary) hypertension: Secondary | ICD-10-CM | POA: Diagnosis not present

## 2020-09-08 DIAGNOSIS — E782 Mixed hyperlipidemia: Secondary | ICD-10-CM | POA: Diagnosis not present

## 2020-09-08 DIAGNOSIS — Z794 Long term (current) use of insulin: Secondary | ICD-10-CM | POA: Diagnosis not present

## 2020-09-12 DIAGNOSIS — Z794 Long term (current) use of insulin: Secondary | ICD-10-CM | POA: Diagnosis not present

## 2020-09-12 DIAGNOSIS — E559 Vitamin D deficiency, unspecified: Secondary | ICD-10-CM | POA: Diagnosis not present

## 2020-09-12 DIAGNOSIS — E1165 Type 2 diabetes mellitus with hyperglycemia: Secondary | ICD-10-CM | POA: Diagnosis not present

## 2020-09-12 DIAGNOSIS — R600 Localized edema: Secondary | ICD-10-CM | POA: Diagnosis not present

## 2020-09-12 DIAGNOSIS — G4733 Obstructive sleep apnea (adult) (pediatric): Secondary | ICD-10-CM | POA: Diagnosis not present

## 2020-09-12 DIAGNOSIS — E230 Hypopituitarism: Secondary | ICD-10-CM | POA: Diagnosis not present

## 2020-09-12 DIAGNOSIS — E669 Obesity, unspecified: Secondary | ICD-10-CM | POA: Diagnosis not present

## 2020-09-12 DIAGNOSIS — Z87891 Personal history of nicotine dependence: Secondary | ICD-10-CM | POA: Diagnosis not present

## 2020-09-12 DIAGNOSIS — E782 Mixed hyperlipidemia: Secondary | ICD-10-CM | POA: Diagnosis not present

## 2020-09-12 DIAGNOSIS — Z7984 Long term (current) use of oral hypoglycemic drugs: Secondary | ICD-10-CM | POA: Diagnosis not present

## 2020-09-12 DIAGNOSIS — Z6839 Body mass index (BMI) 39.0-39.9, adult: Secondary | ICD-10-CM | POA: Diagnosis not present

## 2020-09-12 DIAGNOSIS — I10 Essential (primary) hypertension: Secondary | ICD-10-CM | POA: Diagnosis not present

## 2020-09-15 ENCOUNTER — Ambulatory Visit (INDEPENDENT_AMBULATORY_CARE_PROVIDER_SITE_OTHER): Payer: Medicare Other | Admitting: Family Medicine

## 2020-09-15 VITALS — Ht 70.0 in | Wt 273.0 lb

## 2020-09-15 DIAGNOSIS — Z0000 Encounter for general adult medical examination without abnormal findings: Secondary | ICD-10-CM

## 2020-09-15 NOTE — Patient Instructions (Addendum)
MEDICARE ANNUAL WELLNESS VISIT Health Maintenance Summary and Written Plan of Care  Gregory West ,  Thank you for allowing me to perform your Medicare Annual Wellness Visit and for your ongoing commitment to your health.   Health Maintenance & Immunization History Health Maintenance  Topic Date Due  . COVID-19 Vaccine (4 - Booster) 10/01/2020 (Originally 03/01/2020)  . INFLUENZA VACCINE  12/29/2020  . HEMOGLOBIN A1C  03/10/2021  . OPHTHALMOLOGY EXAM  07/01/2021  . FOOT EXAM  07/25/2021  . TETANUS/TDAP  07/30/2025  . COLONOSCOPY (Pts 45-51yrs Insurance coverage will need to be confirmed)  02/23/2026  . Hepatitis C Screening  Completed  . PNA vac Low Risk Adult  Completed  . HPV VACCINES  Aged Out   Immunization History  Administered Date(s) Administered  . Fluad Quad(high Dose 65+) 01/31/2019, 02/01/2020  . Influenza Split 03/06/2014  . Influenza, High Dose Seasonal PF 01/21/2017, 01/31/2019  . Influenza,inj,Quad PF,6+ Mos 01/12/2018  . Influenza-Unspecified 03/31/2010, 02/09/2012, 03/06/2014, 02/28/2015, 03/14/2016, 01/21/2017, 01/29/2017, 02/02/2019, 01/30/2020, 02/06/2020  . PFIZER(Purple Top)SARS-COV-2 Vaccination 08/03/2019, 08/19/2019, 08/31/2019  . Pneumococcal Conjugate-13 01/21/2017  . Pneumococcal Polysaccharide-23 07/31/2015, 02/13/2020  . Pneumococcal-Unspecified 08/29/2009  . Tdap 01/29/2013, 07/31/2015    These are the patient goals that we discussed: Goals Addressed              This Visit's Progress   .  Patient Stated (pt-stated)        09/15/2020 AWV Goal: Diabetes Management  . Patient will maintain an A1C level below 7.0 . Patient will not develop any diabetic foot complications . Patient will not experience any hypoglycemic episodes over the next 3 months . Patient will notify our office of any CBG readings outside of the provider recommended range by calling [phone] . Patient will adhere to provider recommendations for diabetes  management  Patient Self Management Activities . take all medications as prescribed and report any negative side effects . monitor and record blood sugar readings as directed . adhere to a low carbohydrate diet that incorporates lean proteins, vegetables, whole grains, low glycemic fruits . check feet daily noting any sores, cracks, injuries, or callous formations . see PCP or podiatrist if he notices any changes in his legs, feet, or toenails . Patient will visit PCP and have an A1C level checked every 3 to 6 months as directed  . have a yearly eye exam to monitor for vascular changes associated with diabetes and will request that the report be sent to his pcp.  . consult with his PCP regarding any changes in his health or new or worsening symptoms         This is a list of Health Maintenance Items that are overdue or due now: Shingrix vaccine, Medicare Advance Directive  Orders/Referrals Placed Today: No orders of the defined types were placed in this encounter.  (Contact our referral department at [phone] if you have not spoken with someone about your referral appointment within the next 5 days)    Follow-up Plan  Follow-up with [name], DO as planned  Schedule your appointment at the pharmacy for your shingrix vaccine.  Medicare wellness in one year.    Diabetes Mellitus and Foot Care Foot care is an important part of your health, especially when you have diabetes. Diabetes may cause you to have problems because of poor blood flow (circulation) to your feet and legs, which can cause your skin to:  Become thinner and drier.  Break more easily.  Heal more slowly.  Peel and crack. You may also have nerve damage (neuropathy) in your legs and feet, causing decreased feeling in them. This means that you may not notice minor injuries to your feet that could lead to more serious problems. Noticing and addressing any potential problems early is the best way to  prevent future foot problems. How to care for your feet Foot hygiene  Wash your feet daily with warm water and mild soap. Do not use hot water. Then, pat your feet and the areas between your toes until they are completely dry. Do not soak your feet as this can dry your skin.  Trim your toenails straight across. Do not dig under them or around the cuticle. File the edges of your nails with an emery board or nail file.  Apply a moisturizing lotion or petroleum jelly to the skin on your feet and to dry, brittle toenails. Use lotion that does not contain alcohol and is unscented. Do not apply lotion between your toes.   Shoes and socks  Wear clean socks or stockings every day. Make sure they are not too tight. Do not wear knee-high stockings since they may decrease blood flow to your legs.  Wear shoes that fit properly and have enough cushioning. Always look in your shoes before you put them on to be sure there are no objects inside.  To break in new shoes, wear them for just a few hours a day. This prevents injuries on your feet. Wounds, scrapes, corns, and calluses  Check your feet daily for blisters, cuts, bruises, sores, and redness. If you cannot see the bottom of your feet, use a mirror or ask someone for help.  Do not cut corns or calluses or try to remove them with medicine.  If you find a minor scrape, cut, or break in the skin on your feet, keep it and the skin around it clean and dry. You may clean these areas with mild soap and water. Do not clean the area with peroxide, alcohol, or iodine.  If you have a wound, scrape, corn, or callus on your foot, look at it several times a day to make sure it is healing and not infected. Check for: ? Redness, swelling, or pain. ? Fluid or blood. ? Warmth. ? Pus or a bad smell.   General tips  Do not cross your legs. This may decrease blood flow to your feet.  Do not use heating pads or hot water bottles on your feet. They may burn your  skin. If you have lost feeling in your feet or legs, you may not know this is happening until it is too late.  Protect your feet from hot and cold by wearing shoes, such as at the beach or on hot pavement.  Schedule a complete foot exam at least once a year (annually) or more often if you have foot problems. Report any cuts, sores, or bruises to your health care provider immediately. Where to find more information  American Diabetes Association: www.diabetes.org  Association of Diabetes Care & Education Specialists: www.diabeteseducator.org Contact a health care provider if:  You have a medical condition that increases your risk of infection and you have any cuts, sores, or bruises on your feet.  You have an injury that is not healing.  You have redness on your legs or feet.  You feel burning or tingling in your legs or feet.  You have pain or cramps in your legs and feet.  Your legs or feet are  numb.  Your feet always feel cold.  You have pain around any toenails. Get help right away if:  You have a wound, scrape, corn, or callus on your foot and: ? You have pain, swelling, or redness that gets worse. ? You have fluid or blood coming from the wound, scrape, corn, or callus. ? Your wound, scrape, corn, or callus feels warm to the touch. ? You have pus or a bad smell coming from the wound, scrape, corn, or callus. ? You have a fever. ? You have a red line going up your leg. Summary  Check your feet every day for blisters, cuts, bruises, sores, and redness.  Apply a moisturizing lotion or petroleum jelly to the skin on your feet and to dry, brittle toenails.  Wear shoes that fit properly and have enough cushioning.  If you have foot problems, report any cuts, sores, or bruises to your health care provider immediately.  Schedule a complete foot exam at least once a year (annually) or more often if you have foot problems. This information is not intended to replace advice  given to you by your health care provider. Make sure you discuss any questions you have with your health care provider. Document Revised: 12/06/2019 Document Reviewed: 12/06/2019 Elsevier Patient Education  2021 [employer]   Health Maintenance, Male Adopting a healthy lifestyle and getting preventive care are important in promoting health and wellness. Ask your health care provider about:  The right schedule for you to have regular tests and exams.  Things you can do on your own to prevent diseases and keep yourself healthy. What should I know about diet, weight, and exercise? Eat a healthy diet  Eat a diet that includes plenty of vegetables, fruits, low-fat dairy products, and lean protein.  Do not eat a lot of foods that are high in solid fats, added sugars, or sodium.   Maintain a healthy weight Body mass index (BMI) is a measurement that can be used to identify possible weight problems. It estimates body fat based on height and weight. Your health care provider can help determine your BMI and help you achieve or maintain a healthy weight. Get regular exercise Get regular exercise. This is one of the most important things you can do for your health. Most adults should:  Exercise for at least 150 minutes each week. The exercise should increase your heart rate and make you sweat (moderate-intensity exercise).  Do strengthening exercises at least twice a week. This is in addition to the moderate-intensity exercise.  Spend less time sitting. Even light physical activity can be beneficial. Watch cholesterol and blood lipids Have your blood tested for lipids and cholesterol at 69 years of age, then have this test every 5 years. You may need to have your cholesterol levels checked more often if:  Your lipid or cholesterol levels are high.  You are older than 69 years of age.  You are at high risk for heart disease. What should I know about cancer screening? Many types of cancers  can be detected early and may often be prevented. Depending on your health history and family history, you may need to have cancer screening at various ages. This may include screening for:  Colorectal cancer.  Prostate cancer.  Skin cancer.  Lung cancer. What should I know about heart disease, diabetes, and high blood pressure? Blood pressure and heart disease  High blood pressure causes heart disease and increases the risk of stroke. This is more likely to  develop in people who have high blood pressure readings, are of African descent, or are overweight.  Talk with your health care provider about your target blood pressure readings.  Have your blood pressure checked: ? Every 3-5 years if you are 42-85 years of age. ? Every year if you are 73 years old or older.  If you are between the ages of 48 and 37 and are a current or former smoker, ask your health care provider if you should have a one-time screening for abdominal aortic aneurysm (AAA). Diabetes Have regular diabetes screenings. This checks your fasting blood sugar level. Have the screening done:  Once every three years after age 62 if you are at a normal weight and have a low risk for diabetes.  More often and at a younger age if you are overweight or have a high risk for diabetes. What should I know about preventing infection? Hepatitis B If you have a higher risk for hepatitis B, you should be screened for this virus. Talk with your health care provider to find out if you are at risk for hepatitis B infection. Hepatitis C Blood testing is recommended for:  Everyone born from [birth year] through 1964.  Anyone with known risk factors for hepatitis C. Sexually transmitted infections (STIs)  You should be screened each year for STIs, including gonorrhea and chlamydia, if: ? You are sexually active and are younger than 69 years of age. ? You are older than 69 years of age and your health care provider tells you that you are at  risk for this type of infection. ? Your sexual activity has changed since you were last screened, and you are at increased risk for chlamydia or gonorrhea. Ask your health care provider if you are at risk.  Ask your health care provider about whether you are at high risk for HIV. Your health care provider may recommend a prescription medicine to help prevent HIV infection. If you choose to take medicine to prevent HIV, you should first get tested for HIV. You should then be tested every 3 months for as long as you are taking the medicine. Follow these instructions at home: Lifestyle  Do not use any products that contain nicotine or tobacco, such as cigarettes, e-cigarettes, and chewing tobacco. If you need help quitting, ask your health care provider.  Do not use street drugs.  Do not share needles.  Ask your health care provider for help if you need support or information about quitting drugs. Alcohol use  Do not drink alcohol if your health care provider tells you not to drink.  If you drink alcohol: ? Limit how much you have to 0-2 drinks a day. ? Be aware of how much alcohol is in your drink. In the [place] one drink equals one 12 oz bottle of beer (355 mL), one 5 oz glass of wine (148 mL), or one 1 oz glass of hard liquor (44 mL). General instructions  Schedule regular health, dental, and eye exams.  Stay current with your vaccines.  Tell your health care provider if: ? You often feel depressed. ? You have ever been abused or do not feel safe at home. Summary  Adopting a healthy lifestyle and getting preventive care are important in promoting health and wellness.  Follow your health care provider's instructions about healthy diet, exercising, and getting tested or screened for diseases.  Follow your health care provider's instructions on monitoring your cholesterol and blood pressure. This information is not intended to  replace advice given to you by your health care  provider. Make sure you discuss any questions you have with your health care provider. Document Revised: 05/10/2018 Document Reviewed: 05/10/2018 Elsevier Patient Education  2021 [employer]

## 2020-09-15 NOTE — Progress Notes (Signed)
MEDICARE ANNUAL WELLNESS VISIT  09/15/2020  Telephone Visit Disclaimer This Medicare AWV was conducted by telephone due to national recommendations for restrictions regarding the COVID-19 Pandemic (e.g. social distancing).  I verified, using two identifiers, that I am speaking with Gregory Ready [name] or their authorized healthcare agent. I discussed the limitations, risks, security, and privacy concerns of performing an evaluation and management service by telephone and the potential availability of an in-person appointment in the future. The patient expressed understanding and agreed to proceed.  Location of Patient: Home Location of Provider (nurse):  In the office.  Subjective:    Gregory Ready [name] is a 70 y.o. male patient of Gregory West who had a Medicare Annual Wellness Visit today via telephone. Gregory West is Working full time and lives with their spouse. he has 2 children. he reports that he is socially active and does interact with friends/family regularly. he is moderately physically active and enjoys fishing and reading his bible.  Patient Care Team: Gregory West as PCP - General (Family Medicine) Lyn Records, MD as PCP - Cardiology (Cardiology) Altheimer, Casimiro Needle, MD as Consulting Physician (Endocrinology) Aletha Halim, MD as Referring Physician (Specialist)  Advanced Directives 09/15/2020 09/06/2019 03/22/2019 07/06/2018 08/25/2016 06/25/2015 03/04/2015  Does Patient Have a Medical Advance Directive? No Yes Yes Yes No Yes No  Type of Advance Directive - - Living will Living will;Healthcare Power of [employer] - Healthcare Power of [place];Living will -  Does patient want to make changes to medical advance directive? - No - Patient declined - - - - -  Copy of Healthcare Power of Attorney in Chart? - No - copy requested - - - - -  Would patient like information on creating a medical advance directive? No - Patient declined - - - No - Patient declined -  Yes - Educational materials given    Hospital Utilization Over the Past 12 Months: # of hospitalizations or ER visits: 0 # of surgeries: 1  Review of Systems    Patient reports that his overall health is unchanged compared to last year.  History obtained from chart review and the patient  Patient Reported Readings (BP, Pulse, CBG, Weight, etc) Hemoglobin A1c 7.8 Weight 273 lbs Height 105f10  Pain Assessment Pain : 0-10 Pain Score: 7  Pain Type: Acute pain Pain Location: Back Pain Orientation: Lower Pain Descriptors / Indicators: Constant,Aching,Spasm Pain Onset: More than a month ago Pain Frequency: Intermittent Pain Relieving Factors: Muscle relaxer  Pain Relieving Factors: Muscle relaxer  Current Medications & Allergies (verified) Allergies as of 09/15/2020      Reactions   Statins    Muscle cramping all over body. Lipitor, Crestor, and pravastatin.   Lisinopril Cough   Victoza [liraglutide] Other (See Comments)   Caused pancreatitis      Medication List       Accurate as of September 15, 2020  9:35 AM. If you have any questions, ask your nurse or doctor.        aspirin 81 MG tablet Take 81 mg by mouth daily.   carboxymethylcellulose 0.5 % Soln Commonly known as: REFRESH PLUS INSTILL 1 DROP IN BOTH EYES FOUR TIMES A DAY   ezetimibe 10 MG tablet Commonly known as: ZETIA Take 1 tablet (10 mg total) by mouth daily.   Fish Oil 1000 MG Caps Take by mouth. Takes 3 capsules a day   furosemide 20 MG tablet Commonly known as: LASIX Take 20 mg by mouth daily. Patient  takes it as needed. 09/15/20   insulin glargine 100 UNIT/ML injection Commonly known as: LANTUS Inject 40 Units into the skin 2 (two) times daily.   Jardiance 25 MG Tabs tablet Generic drug: empagliflozin Take 25 mg by mouth daily.   latanoprost 0.005 % ophthalmic solution Commonly known as: XALATAN INSTILL 1 DROP IN EACH EYE AT BEDTIME   levothyroxine 25 MCG tablet Commonly known as:  SYNTHROID Take 25 mcg by mouth daily before breakfast.   losartan 25 MG tablet Commonly known as: COZAAR Take 1 tablet (25 mg total) by mouth daily.   metFORMIN 750 MG 24 hr tablet Commonly known as: GLUCOPHAGE-XR TAKE 1 TABLET BY MOUTH TWICE DAILY BEFORE A MEAL. What changed: additional instructions   omeprazole 40 MG capsule Commonly known as: PRILOSEC TAKE 1 CAPSULE DAILY   OneTouch Verio test strip Generic drug: glucose blood 1 each by Other route as needed.   potassium chloride 10 MEQ CR capsule Commonly known as: MICRO-K TAKE ONE CAPSULE BY MOUTH DAILY AS NEEDED -TAKE WITH LASIX   POTASSIUM PO Take 10 mEq by mouth. Takes it as needed on the same day as lasix   propranolol ER 60 MG 24 hr capsule Commonly known as: INDERAL LA TAKE 1 CAPSULE DAILY   rosuvastatin 5 MG tablet Commonly known as: CRESTOR Take 1 tablet by mouth daily.   tamsulosin 0.4 MG Caps capsule Commonly known as: FLOMAX Take 1 capsule (0.4 mg total) by mouth daily.   traZODone 50 MG tablet Commonly known as: DESYREL TAKE ONE-HALF (1/2) TO ONE TABLET AT BEDTIME AS NEEDED FOR SLEEP   Vitamin D3 125 MCG (5000 UT) Caps Take 1 capsule by mouth daily.       History (reviewed): Past Medical History:  Diagnosis Date  . Arthritis   . Chest pain    a. Normal cors 2001. b. Neg stress test 2012, 05/2014.  . Diabetes mellitus without complication (HCC)   . GERD (gastroesophageal reflux disease)   . Hyperlipidemia   . Hypertension   . Hypertensive response to exercise   . Hypothyroidism   . Obesity   . Obstructive sleep apnea    Currently untreated   . PONV (postoperative nausea and vomiting)    Past Surgical History:  Procedure Laterality Date  . CARDIAC CATHETERIZATION    . CERVICAL FUSION    . HERNIA REPAIR    . KNEE ARTHROSCOPY  1984   both knee  . SHOULDER ARTHROSCOPY WITH SUBACROMIAL DECOMPRESSION Right 09/13/2019   Procedure: RIGHT SHOULDER ARTHROSCOPY WITH EXTENSIVE DEBRIDEMENT,  SUBACROMIAL DECOMPRESSION,;  Surgeon: Kathryne Hitch, MD;  Location: [facility];  Service: Orthopedics;  Laterality: Right;   Family History  Problem Relation Age of Onset  . Diabetes Mother   . Heart disease Mother   . Hypertension Mother   . Stroke Mother   . Heart attack Mother   . Cancer Father   . Heart disease Father   . Hyperlipidemia Sister   . Hypertension Sister   . Diabetes Maternal Grandmother   . Stroke Maternal Grandmother   . Heart attack Maternal Grandmother   . Diabetes Maternal Grandfather   . Hypertension Maternal Grandfather   . Heart attack Maternal Grandfather   . Diabetes Sister   . Hypertension Sister   . Heart attack Paternal Grandmother   . Heart attack Paternal Grandfather    Social History   Socioeconomic History  . Marital status: Married    Spouse name: [name]  . Number of  children: 2  . Years of education: 54  . Highest education level: Bachelor's degree (e.g., BA, AB, BS)  Occupational History    Comment: Retired    Comment: working part time  Tobacco Use  . Smoking status: Former Smoker    Packs/day: 1.50    Years: 30.00    Pack years: 45.00    Types: Cigarettes    Quit date: 1995    Years since quitting: 27.3  . Smokeless tobacco: Never Used  Vaping Use  . Vaping Use: Never used  Substance and Sexual Activity  . Alcohol use: No  . Drug use: No  . Sexual activity: Not on file  Other Topics Concern  . Not on file  Social History Narrative   Lives with his wife. He is working working part-time (sometimes). He likes to read his bible and go fishing in his free time.    Social Determinants of Health   Financial Resource Strain: Low Risk   . Difficulty of Paying Living Expenses: Not hard at all  Food Insecurity: No Food Insecurity  . Worried About Programme researcher, broadcasting/film/video in the Last Year: Never true  . Ran Out of Food in the Last Year: Never true  Transportation Needs: No Transportation Needs  . Lack of  Transportation (Medical): No  . Lack of Transportation (Non-Medical): No  Physical Activity: Sufficiently Active  . Days of Exercise per Week: 3 days  . Minutes of Exercise per Session: 50 min  Stress: No Stress Concern Present  . Feeling of Stress : Not at all  Social Connections: Socially Integrated  . Frequency of Communication with Friends and Family: More than three times a week  . Frequency of Social Gatherings with Friends and Family: Three times a week  . Attends Religious Services: More than 4 times per year  . Active Member of Clubs or Organizations: Yes  . Attends Banker Meetings: More than 4 times per year  . Marital Status: Married    Activities of Daily Living In your present state of health, West you have any difficulty performing the following activities: 09/15/2020  Hearing? N  Vision? Y  Comment blurred vision, has eye drops and they are helping.  Difficulty concentrating or making decisions? N  Walking or climbing stairs? Y  Comment Because of his knees and back pain. (Arthritis)  Dressing or bathing? N  Doing errands, shopping? N  Preparing Food and eating ? N  Using the Toilet? N  In the past six months, have you accidently leaked urine? N  West you have problems with loss of bowel control? N  Managing your Medications? N  Managing your Finances? N  Housekeeping or managing your Housekeeping? N  Some recent data might be hidden    Patient Education/ Literacy How often West you need to have someone help you when you read instructions, pamphlets, or other written materials from your doctor or pharmacy?: 1 - Never What is the last grade level you completed in school?: Bachelor's Degree  Exercise Current Exercise Habits: Home exercise routine, Type of exercise: treadmill, Time (Minutes): 45, Frequency (Times/Week): 4, Weekly Exercise (Minutes/Week): 180, Intensity: Moderate, Exercise limited by: None identified  Diet Patient reports consuming 2-3  meals a day and 1-2 snack(s) a day Patient reports that his primary diet is: Regular Patient reports that she does have regular access to food.   Depression Screen PHQ 2/9 Scores 09/15/2020 08/02/2019 06/29/2018 12/23/2017 07/12/2017 10/07/2016 12/31/2015  PHQ - 2 Score 0 -  0 0 0 0 0  PHQ- 9 Score - - - - - 0 5  Exception Documentation - Patient refusal - - - - -     Fall Risk Fall Risk  09/15/2020 08/02/2019 06/28/2018 12/23/2017 12/31/2015  Falls in the past year? 0 0 1 No No  Number falls in past yr: 0 0 1 - -  Injury with Fall? 0 0 1 - -  Risk for fall due to : No Fall Risks History of fall(s) - - -  Follow up Falls evaluation completed - Falls evaluation completed - -     Objective:  Gregory Readyichard A Wass [name] seemed alert and oriented and he participated appropriately during our telephone visit.  Blood Pressure Weight BMI  BP Readings from Last 3 Encounters:  07/31/20 128/68  05/09/20 116/68  02/01/20 132/76   Wt Readings from Last 3 Encounters:  09/15/20 273 lb (123.8 kg)  07/31/20 277 lb 1.6 oz (125.7 kg)  05/09/20 281 lb (127.5 kg)   BMI Readings from Last 1 Encounters:  09/15/20 39.17 kg/m    *Unable to obtain current vital signs, weight, and BMI due to telephone visit type  Hearing/Vision  . Latravis did not seem to have difficulty with hearing/understanding during the telephone conversation . Reports that he has had a formal eye exam by an eye care professional within the past year . Reports that he has not had a formal hearing evaluation within the past year *Unable to fully assess hearing and vision during telephone visit type  Cognitive Function: 6CIT Screen 09/15/2020  What Year? 0 points  What month? 0 points  What time? 0 points  Count back from 20 0 points  Months in reverse 0 points  Repeat phrase 0 points  Total Score 0   (Normal:0-7, Significant for Dysfunction: >8)  Normal Cognitive Function Screening: Yes   Immunization & Health Maintenance  Record Immunization History  Administered Date(s) Administered  . Fluad Quad(high Dose 65+) 01/31/2019, 02/01/2020  . Influenza Split 03/06/2014  . Influenza, High Dose Seasonal PF 01/21/2017, 01/31/2019  . Influenza,inj,Quad PF,6+ Mos 01/12/2018  . Influenza-Unspecified 03/31/2010, 02/09/2012, 03/06/2014, 02/28/2015, 03/14/2016, 01/21/2017, 01/29/2017, 02/02/2019, 01/30/2020, 02/06/2020  . PFIZER(Purple Top)SARS-COV-2 Vaccination 08/03/2019, 08/19/2019, 08/31/2019  . Pneumococcal Conjugate-13 01/21/2017  . Pneumococcal Polysaccharide-23 07/31/2015, 02/13/2020  . Pneumococcal-Unspecified 08/29/2009  . Tdap 01/29/2013, 07/31/2015    Health Maintenance  Topic Date Due  . COVID-19 Vaccine (4 - Booster) 10/01/2020 (Originally 03/01/2020)  . INFLUENZA VACCINE  12/29/2020  . HEMOGLOBIN A1C  03/10/2021  . OPHTHALMOLOGY EXAM  07/01/2021  . FOOT EXAM  07/25/2021  . TETANUS/TDAP  07/30/2025  . COLONOSCOPY (Pts 45-2160yrs Insurance coverage will need to be confirmed)  02/23/2026  . Hepatitis C Screening  Completed  . PNA vac Low Risk Adult  Completed  . HPV VACCINES  Aged Out       Assessment  This is a routine wellness examination for Freescale Semiconductorichard A Cizek WestMarland Kitchen.  Health Maintenance: Due or Overdue There are no preventive care reminders to display for this patient.  Darold Governor RooksA Stairs [name] does not need a referral for Community Assistance: Care Management:   no Social Work:    no Prescription Assistance:  no Nutrition/Diabetes Education:  no   Plan:  Personalized Goals Goals Addressed              This Visit's Progress   .  Patient Stated (pt-stated)        09/15/2020 AWV Goal: Diabetes Management  . Patient  will maintain an A1C level below 7.0 . Patient will not develop any diabetic foot complications . Patient will not experience any hypoglycemic episodes over the next 3 months . Patient will notify our office of any CBG readings outside of the provider recommended range  by calling [phone] . Patient will adhere to provider recommendations for diabetes management  Patient Self Management Activities . take all medications as prescribed and report any negative side effects . monitor and record blood sugar readings as directed . adhere to a low carbohydrate diet that incorporates lean proteins, vegetables, whole grains, low glycemic fruits . check feet daily noting any sores, cracks, injuries, or callous formations . see PCP or podiatrist if he notices any changes in his legs, feet, or toenails . Patient will visit PCP and have an A1C level checked every 3 to 6 months as directed  . have a yearly eye exam to monitor for vascular changes associated with diabetes and will request that the report be sent to his pcp.  . consult with his PCP regarding any changes in his health or new or worsening symptoms       Personalized Health Maintenance & Screening Recommendations  Shingrix vaccine, Medicare Advance Directive  Lung Cancer Screening Recommended: no (Low Dose CT Chest recommended if Age 109-80 years, 30 pack-year currently smoking OR have quit w/in past 15 years) Hepatitis C Screening recommended: no HIV Screening recommended: no  Advanced Directives: Written information was not prepared per patient's request.  Referrals & Orders No orders of the defined types were placed in this encounter.   Follow-up Plan . Follow-up with Gregory West as planned . Schedule your appointment at the pharmacy for your shingrix vaccine. . Medicare wellness in one year.   I have personally reviewed and noted the following in the patient's chart:   . Medical and social history . Use of alcohol, tobacco or illicit drugs  . Current medications and supplements . Functional ability and status . Nutritional status . Physical activity . Advanced directives . List of other physicians . Hospitalizations, surgeries, and ER visits in previous 12  months . Vitals . Screenings to include cognitive, depression, and falls . Referrals and appointments  In addition, I have reviewed and discussed with Gregory Ready [name] certain preventive protocols, quality metrics, and best practice recommendations. A written personalized care plan for preventive services as well as general preventive health recommendations is available and can be mailed to the patient at his request.      Modesto Charon, RN  09/15/2020

## 2020-10-02 DIAGNOSIS — E1142 Type 2 diabetes mellitus with diabetic polyneuropathy: Secondary | ICD-10-CM | POA: Diagnosis not present

## 2020-10-02 DIAGNOSIS — L84 Corns and callosities: Secondary | ICD-10-CM | POA: Diagnosis not present

## 2020-10-02 DIAGNOSIS — B351 Tinea unguium: Secondary | ICD-10-CM | POA: Diagnosis not present

## 2020-10-28 ENCOUNTER — Other Ambulatory Visit: Payer: Self-pay

## 2020-10-28 ENCOUNTER — Ambulatory Visit (INDEPENDENT_AMBULATORY_CARE_PROVIDER_SITE_OTHER): Payer: Medicare Other | Admitting: Sports Medicine

## 2020-10-28 ENCOUNTER — Ambulatory Visit (INDEPENDENT_AMBULATORY_CARE_PROVIDER_SITE_OTHER): Payer: Medicare Other

## 2020-10-28 DIAGNOSIS — M47816 Spondylosis without myelopathy or radiculopathy, lumbar region: Secondary | ICD-10-CM

## 2020-10-28 DIAGNOSIS — R109 Unspecified abdominal pain: Secondary | ICD-10-CM | POA: Diagnosis not present

## 2020-10-28 DIAGNOSIS — G8929 Other chronic pain: Secondary | ICD-10-CM | POA: Diagnosis not present

## 2020-10-28 DIAGNOSIS — M545 Low back pain, unspecified: Secondary | ICD-10-CM | POA: Diagnosis not present

## 2020-10-28 DIAGNOSIS — R10A0 Flank pain, unspecified side: Secondary | ICD-10-CM

## 2020-10-28 LAB — POCT URINALYSIS DIP (CLINITEK)
Bilirubin, UA: NEGATIVE
Blood, UA: NEGATIVE
Glucose, UA: 500 mg/dL — AB
Ketones, POC UA: NEGATIVE mg/dL
Leukocytes, UA: NEGATIVE
Nitrite, UA: NEGATIVE
POC PROTEIN,UA: NEGATIVE
Spec Grav, UA: 1.015 (ref 1.010–1.025)
Urobilinogen, UA: 0.2 E.U./dL
pH, UA: 6 (ref 5.0–8.0)

## 2020-10-28 MED ORDER — MELOXICAM 15 MG PO TABS: ORAL_TABLET | ORAL | 3 refills | Status: DC

## 2020-10-28 MED ORDER — PREDNISONE 50 MG PO TABS: ORAL_TABLET | ORAL | 0 refills | Status: DC

## 2020-10-28 NOTE — Progress Notes (Signed)
    Procedures performed today:    None.  Independent interpretation of notes and tests performed by another provider:   None.  Brief History, Exam, Impression, and Recommendations:    Lumbar spondylosis This is a pleasant 70 year old male, he has a long history of low back pain, midline, axial, worse with sitting, flexion, Valsalva.  It sounds like he had some care many years ago, an MRI, and some medications which provided moderate improvement. He also had some pain up in the right flank, we obtained a urinalysis that was negative with the exception of glycosuria, no blood. Proceeding again with conservative treatment including x-rays, formal physical therapy, 5 days of prednisone, meloxicam, he has Flexeril at home that he can use as needed, return to see me in 6 weeks, MRI for interventional planning if no better. This is a chronic process with exacerbation and pharmacologic management.    ___________________________________________ Ihor Austin. Benjamin Stain, M.D., ABFM., CAQSM. Primary Care and Sports Medicine [facility]  Adjunct Instructor of Family Medicine  [employer]

## 2020-10-28 NOTE — Assessment & Plan Note (Addendum)
This is a pleasant 70 year old male, he has a long history of low back pain, midline, axial, worse with sitting, flexion, Valsalva.  It sounds like he had some care many years ago, an MRI, and some medications which provided moderate improvement. He also had some pain up in the right flank, we obtained a urinalysis that was negative with the exception of glycosuria, no blood. Proceeding again with conservative treatment including x-rays, formal physical therapy, 5 days of prednisone, meloxicam, he has Flexeril at home that he can use as needed, return to see me in 6 weeks, MRI for interventional planning if no better. This is a chronic process with exacerbation and pharmacologic management.

## 2020-11-03 ENCOUNTER — Other Ambulatory Visit: Payer: Self-pay

## 2020-11-03 ENCOUNTER — Ambulatory Visit (INDEPENDENT_AMBULATORY_CARE_PROVIDER_SITE_OTHER): Payer: Medicare Other | Admitting: Rehabilitative and Restorative Service Providers"

## 2020-11-03 ENCOUNTER — Encounter: Payer: Self-pay | Admitting: Rehabilitative and Restorative Service Providers"

## 2020-11-03 DIAGNOSIS — M545 Low back pain, unspecified: Secondary | ICD-10-CM | POA: Diagnosis not present

## 2020-11-03 DIAGNOSIS — G8929 Other chronic pain: Secondary | ICD-10-CM | POA: Diagnosis not present

## 2020-11-03 DIAGNOSIS — R29898 Other symptoms and signs involving the musculoskeletal system: Secondary | ICD-10-CM | POA: Diagnosis not present

## 2020-11-03 DIAGNOSIS — R109 Unspecified abdominal pain: Secondary | ICD-10-CM | POA: Diagnosis not present

## 2020-11-03 NOTE — Patient Instructions (Signed)
Access Code: [email or web address]: [email or web address]: 06/06/2022Prepared by: Yahsir Wickens HoltExercises  Prone Quadriceps Stretch with Strap - 2 x daily - 7 x weekly - 1 sets - 3 reps - 30 sec hold  Hooklying Hamstring Stretch with Strap - 2 x daily - 7 x weekly - 1 sets - 3 reps - 30 sec hold  Supine ITB Stretch with Strap - 2 x daily - 7 x weekly - 1 sets - 3 reps - 30 sec hold  Standing Lumbar Extension - 2 x daily - 7 x weekly - 1 sets - 2-3 reps - 2-3 sec hold  Seated Sidebending - 2 x daily - 7 x weekly - 1 sets - 3 reps - 15-20 sec hold  Sit to Stand - 2 x daily - 7 x weekly - 1 sets - 10 reps - 3-5 sec hold Patient Education  Office Posture

## 2020-11-03 NOTE — Therapy (Signed)
[facility] [address] [place], [place], [address] Phone: [phone]   Fax:  [phone]  Physical Therapy Evaluation  Patient Details  Name: Gregory ReadyRichard A Claud [name] MRN: [id number] Date of Birth: May 14, 1951 Referring Provider (PT): Dr Benjamin Stainhekkekandam   Encounter Date: 11/03/2020   PT End of Session - 11/03/20 1014    Visit Number 1    Number of Visits 12    Date for PT Re-Evaluation 12/15/20    PT Start Time 0932    PT Stop Time 1016    PT Time Calculation (min) 44 min    Activity Tolerance Patient tolerated treatment well           Past Medical History:  Diagnosis Date  . Arthritis   . Chest pain    a. Normal cors 2001. b. Neg stress test 2012, 05/2014.  . Diabetes mellitus without complication (HCC)   . GERD (gastroesophageal reflux disease)   . Hyperlipidemia   . Hypertension   . Hypertensive response to exercise   . Hypothyroidism   . Obesity   . Obstructive sleep apnea    Currently untreated   . PONV (postoperative nausea and vomiting)     Past Surgical History:  Procedure Laterality Date  . CARDIAC CATHETERIZATION    . CERVICAL FUSION    . HERNIA REPAIR    . KNEE ARTHROSCOPY  1984   both knee  . SHOULDER ARTHROSCOPY WITH SUBACROMIAL DECOMPRESSION Right 09/13/2019   Procedure: RIGHT SHOULDER ARTHROSCOPY WITH EXTENSIVE DEBRIDEMENT, SUBACROMIAL DECOMPRESSION,;  Surgeon: Kathryne HitchBlackman, Christopher Y, MD;  Location: [facility];  Service: Orthopedics;  Laterality: Right;    There were no vitals filed for this visit.    Subjective Assessment - 11/03/20 0940    Subjective Patient reports flare up of Rt side and LB in the past month with no known injury. He has a history of LBP for 30 years.    Pertinent History AODM; HTN; cervical discetomy 1997, 2010; chronic LBP since 1978; arthris; B shd dislocation; sprains; RC sx 4/21    Patient Stated Goals help decrease pain    Currently in Pain? Yes     Pain Score 6     Pain Location Back    Pain Orientation Right;Lower;Mid    Pain Descriptors / Indicators Nagging;Aching;Dull;Sharp   sharp with reaching   Pain Type Acute pain;Chronic pain    Pain Radiating Towards radiating into both legs    Pain Onset More than a month ago    Pain Frequency Constant    Aggravating Factors  reaching; moving; lifting; functional activities    Pain Relieving Factors heat              [facility] PT Assessment - 11/03/20 0001      Assessment   Medical Diagnosis Rt flank pain; mid to low back pain    Referring Provider (PT) Dr Benjamin Stainhekkekandam    Onset Date/Surgical Date 07/29/20    Hand Dominance Right    Next MD Visit 12/09/20    Prior Therapy here for LBP; no rehab for shoulder sx      Precautions   Precautions Anterior Hip      Balance Screen   Has the patient fallen in the past 6 months No    Has the patient had a decrease in activity level because of a fear of falling?  No    Is the patient reluctant to leave their home because of a fear of falling?  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    Leisure household chores; sedentary      Observation/Other Assessments   Focus on Therapeutic Outcomes (FOTO)  40      Sensation   Additional Comments bilat great toes are numb      Posture/Postural Control   Posture Comments head forward; shoulders rounded; decreased lumbar lordosis      AROM   Lumbar Flexion 60% painful    Lumbar Extension 40% tightness and painful    Lumbar - Right Side Bend 50% painful    Lumbar - Left Side Bend 70% mild pain    Lumbar - Right Rotation 50% painful    Lumbar - Left Rotation 50% mild pain      Strength   Overall Strength Within functional limits for tasks performed      Flexibility   Hamstrings tight Rt > Lt end range    Quadriceps tight Rt 90 deg Lt 95 deg    ITB tight Rt     Piriformis tight Rt      Palpation   Spinal mobility hypomobile lumbar spine with PA mobs    Palpation comment muscular tightness Rt psoas; Rt lumbar paraspinals, QL, lats, piriformis, gluts - less tightness Lt lumbar and posterior hip      Special Tests   Other special tests (-) SLR      Balance   Balance Assessed --   SLS Rt foot difficult <1 sec; Lt 2 sec                     Objective measurements completed on examination: See above findings.       [facility] Adult PT Treatment/Exercise - 11/03/20 0001      Self-Care   Self-Care Other Self-Care Comments    Other Self-Care Comments  initiated spine care education - provided handouts on posture and alignment as well as office ergonomics      Lumbar Exercises: Stretches   Passive Hamstring Stretch Right;Left;2 reps;30 seconds   supine with strap opposite knee bent   Standing Side Bend Limitations seated lateral trunk flexion to Lt with Rt UE over head to tolerance 15-20 sec hold x 3 reps to Rt x 1 rep    Standing Extension 2 reps;5 seconds    Prone on Elbows Stretch Limitations pain in shoulders    Quad Stretch Right;Left;2 reps;30 seconds   prone with strap   ITB Stretch Right;Left;2 reps;30 seconds   supine with strap opposite knee bent     Lumbar Exercises: Seated   Sit to Stand 5 reps   VC to engage core                 PT Education - 11/03/20 1013    Education Details HEP POC    Person(s) Educated Patient    Methods Explanation;Demonstration;Tactile cues;Verbal cues;Handout    Comprehension Verbalized understanding;Returned demonstration;Verbal cues required;Tactile cues required               PT Long Term Goals - 11/03/20 1343      PT LONG TERM GOAL #1   Title Increase trunk and LE ROM/mobility allowing patient to move to change positions with greater ease and decreased pain    Time 6    Period Weeks    Status New  Target Date 12/15/20      PT LONG TERM GOAL #2   Title Decrease  pain with functional activities allowing patient to sit or stand for 1 hour without increased pain    Time 6    Period Weeks    Status New    Target Date 12/15/20      PT LONG TERM GOAL #3   Title Patient to demonstrate and/or verbalize proper body mechanics for transitional movements and lifting    Time 6    Period Weeks    Status New    Target Date 12/15/20      PT LONG TERM GOAL #4   Title Independent in HEP including aquatic exercise as indicated    Time 6    Period Weeks    Status New    Target Date 12/15/20      PT LONG TERM GOAL #5   Title Improve functional limitations score to 49    Time 6    Status New    Target Date 12/15/20                  Plan - 11/03/20 1333    Clinical Impression Statement Patient presents with 2-3 month history of increased Rt > Lt LBP which is chronic and recurrent in nature as well as onset of Rt flank pain. He does sleep on his Rt side primarily. Patient has a history of bilat shoulder pain with Rt RCR 4/21 with no rehab following surgery. He has chroinc LBP as well asAODM and bilat LE numbness felt to be releated to AODM. Patient presents with poor posture and alignment; limited trunk and LE mobility/ROM; muscular tightness to palpation; poor movement and pain with functional activities. He has been much more sedentary since the covid pandemic started 2 years ago. 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 Patient will benefit from PT to address problems identified.    Personal Factors and Comorbidities Age;Fitness;Past/Current Experience;Comorbidity 1;Comorbidity 2    Comorbidities AODM, HTN, obesity; bilat shoulder problems post Rt RCR 4/21; chronic LBP    Examination-Activity Limitations Transfers;Bed Mobility;Reach Overhead;Bend;Stand    Examination-Participation Restrictions Cleaning;Yard Work    Stability/Clinical Decision Making Stable/Uncomplicated    Clinical Decision Making Moderate     Rehab Potential Good    PT Frequency 2x / week    PT Duration 6 weeks    PT Treatment/Interventions ADLs/Self Care Home Management;Aquatic Therapy;Cryotherapy;Electrical Stimulation;Iontophoresis 4mg /ml Dexamethasone;Moist Heat;Ultrasound;Functional mobility training;Therapeutic activities;Therapeutic exercise;Balance training;Neuromuscular re-education;Patient/family education;Manual techniques;Dry needling;Taping    PT Next Visit Plan review HEP; add stretching for hip flexors, piriformis; progress with core stabilization and strengthening; increase activity level; manual work and modalities as indicated    PT Home Exercise Plan [email or web address]    Consulted and Agree with Plan of Care Patient           Patient will benefit from skilled therapeutic intervention in order to improve the following deficits and impairments:  Decreased range of motion,Increased fascial restricitons,Pain,Hypomobility,Improper body mechanics,Impaired flexibility,Decreased mobility,Decreased strength,Postural dysfunction,Obesity  Visit Diagnosis: Flank pain  Chronic bilateral low back pain without sciatica  Other symptoms and signs involving the musculoskeletal system     Problem List Patient Active Problem List   Diagnosis Date Noted  . Diabetic neuropathy (HCC) 07/31/2020  . Abdominal aortic aneurysm (HCC) 07/31/2020  . Insomnia 07/31/2020  . GERD (gastroesophageal reflux disease) 01/09/2020  . Partial nontraumatic tear of right rotator cuff 08/22/2019  . Impingement syndrome of  right shoulder 08/22/2019  . Bilateral shoulder pain 08/02/2019  . Interstitial pulmonary disease (HCC) 06/29/2018  . Hypertension associated with diabetes (HCC) 07/13/2017  . Benign essential tremor 07/13/2017  . Kidney cyst, acquired 01/24/2017  . Seborrheic keratoses 04/01/2016  . Diverticulosis of colon without hemorrhage 02/26/2016  . Anxiety state 10/02/2015  . BPH (benign prostatic hyperplasia) 07/31/2015  .  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 Vitamin D deficiency 03/06/2015  . History of pancreatitis 03/06/2015  . Lumbar spondylosis 03/04/2015  . Type II diabetes mellitus with complication (HCC) 05/15/2014  . Obstructive sleep apnea 05/15/2014  . Hyperlipidemia 05/15/2014  . Glaucoma suspect 10/21/2011    Dodge Ator Rober Minion PT, MPH  11/03/2020, 1:50 PM  [facility] [address] [place], [place], [address] Phone: [phone]   Fax:  [phone]  Name: DAESHON GRAMMATICO [name] MRN: [id number] Date of Birth: 1950-07-12

## 2020-11-06 ENCOUNTER — Ambulatory Visit (INDEPENDENT_AMBULATORY_CARE_PROVIDER_SITE_OTHER): Payer: Medicare Other | Admitting: Physical Therapy

## 2020-11-06 ENCOUNTER — Encounter: Payer: Self-pay | Admitting: Physical Therapy

## 2020-11-06 ENCOUNTER — Other Ambulatory Visit: Payer: Self-pay

## 2020-11-06 DIAGNOSIS — R109 Unspecified abdominal pain: Secondary | ICD-10-CM

## 2020-11-06 DIAGNOSIS — G8929 Other chronic pain: Secondary | ICD-10-CM | POA: Diagnosis not present

## 2020-11-06 DIAGNOSIS — R29898 Other symptoms and signs involving the musculoskeletal system: Secondary | ICD-10-CM

## 2020-11-06 DIAGNOSIS — M545 Low back pain, unspecified: Secondary | ICD-10-CM | POA: Diagnosis not present

## 2020-11-06 NOTE — Therapy (Signed)
[facility] [place] [address] [place], [place], [address] Phone: [phone]   Fax:  [phone]  Physical Therapy Treatment  Patient Details  Name: Gregory KAGEL [name] MRN: [id number] Date of Birth: 09-Jul-1950 Referring Provider (PT): Dr Benjamin Stain   Encounter Date: 11/06/2020   PT End of Session - 11/06/20 1655     Visit Number 2    Number of Visits 12    Date for PT Re-Evaluation 12/15/20    PT Start Time 1655    PT Stop Time 1740    PT Time Calculation (min) 45 min    Activity Tolerance Patient tolerated treatment well             Past Medical History:  Diagnosis Date   Arthritis    Chest pain    a. Normal cors 2001. b. Neg stress test 2012, 05/2014.   Diabetes mellitus without complication (HCC)    GERD (gastroesophageal reflux disease)    Hyperlipidemia    Hypertension    Hypertensive response to exercise    Hypothyroidism    Obesity    Obstructive sleep apnea    Currently untreated    PONV (postoperative nausea and vomiting)     Past Surgical History:  Procedure Laterality Date   CARDIAC CATHETERIZATION     CERVICAL FUSION     HERNIA REPAIR     KNEE ARTHROSCOPY  1984   both knee   SHOULDER ARTHROSCOPY WITH SUBACROMIAL DECOMPRESSION Right 09/13/2019   Procedure: RIGHT SHOULDER ARTHROSCOPY WITH EXTENSIVE DEBRIDEMENT, SUBACROMIAL DECOMPRESSION,;  Surgeon: Kathryne Hitch, MD;  Location:  [facility];  Service: Orthopedics;  Laterality: Right;    There were no vitals filed for this visit.   Subjective Assessment - 11/06/20 1656     Subjective Pt reports he had an accident when he was performing hygiene after BM; "pulled my back when I was reaching back".  He's reports he hasn't performed the HEP .  He continues to do a lot of driving and sitting.    Pertinent History AODM; HTN; cervical discetomy 1997, 2010; chronic LBP since 1978; arthris; B shd dislocation; sprains; RC sx  4/21    Patient Stated Goals help decrease pain    Currently in Pain? Yes    Pain Score 9     Pain Location Back    Pain Orientation Right;Lower    Pain Descriptors / Indicators Aching    Pain Onset More than a month ago                [facility] PT Assessment - 11/06/20 0001       Assessment   Medical Diagnosis Rt flank pain; mid to low back pain    Referring Provider (PT) Dr Benjamin Stain    Onset Date/Surgical Date 07/29/20    Hand Dominance Right    Next MD Visit 12/09/20    Prior Therapy here for LBP; no rehab for shoulder sx               [facility] Adult PT Treatment/Exercise - 11/06/20 0001       Self-Care   Other Self-Care Comments  reviewed self massage with ball against wall and lumbar musculature; pt returned demo.  reminded pt to use TENS/MHP at home for pain control.      Lumbar Exercises: Stretches   Passive Hamstring Stretch Left;Right;2 reps;20 seconds   seated with hip hinge   Hip Flexor Stretch Right;Left;2 reps;20 seconds   seated with leg  back and arm overhead   Standing Extension 5 seconds;5 reps   hands on sink, shifting hips forward.   Other Lumbar Stretch Exercise sliding hands up cabinets for shoulder/low back stretch x 5 reps, then side bend with Rt arm on cabinet, sliding it overhead to the L.    Other Lumbar Stretch Exercise sidelying open book with thoracic rotation x 8 reps each side.      Lumbar Exercises: Aerobic   Nustep L4: 5 min, legs only for warm up.    Other Aerobic Exercise walking 1-3 laps in between exercises to assess response to stretches.      Lumbar Exercises: Seated   Sit to Stand 5 reps   VC to engage core                   PT Education - 11/06/20 1745     Education Details HEP - added exercise.  Self care.    Person(s) Educated Patient    Methods Explanation;Demonstration;Verbal cues;Handout    Comprehension Returned demonstration;Verbalized understanding                 PT Long Term Goals - 11/03/20  1343       PT LONG TERM GOAL #1   Title Increase trunk and LE ROM/mobility allowing patient to move to change positions with greater ease and decreased pain    Time 6    Period Weeks    Status New    Target Date 12/15/20      PT LONG TERM GOAL #2   Title Decrease pain with functional activities allowing patient to sit or stand for 1 hour without increased pain    Time 6    Period Weeks    Status New    Target Date 12/15/20      PT LONG TERM GOAL #3   Title Patient to demonstrate and/or verbalize proper body mechanics for transitional movements and lifting    Time 6    Period Weeks    Status New    Target Date 12/15/20      PT LONG TERM GOAL #4   Title Independent in HEP including aquatic exercise as indicated    Time 6    Period Weeks    Status New    Target Date 12/15/20      PT LONG TERM GOAL #5   Title Improve functional limitations score to 49    Time 6    Status New    Target Date 12/15/20                   Plan - 11/06/20 1704     Clinical Impression Statement Pt reported that his back pain reduced to 5/10  while on NuStep, further reduction with stretches and walking.  Pt shown alternative positions for LE/LB stretches for HEP to assist with improved compliance. Encouraged pt to begin a short walking program as well.  Goals are ongoing.    Personal Factors and Comorbidities Age;Fitness;Past/Current Experience;Comorbidity 1;Comorbidity 2    Comorbidities AODM, HTN, obesity; bilat shoulder problems post Rt RCR 4/21; chronic LBP    Examination-Activity Limitations Transfers;Bed Mobility;Reach Overhead;Bend;Stand    Examination-Participation Restrictions Cleaning;Yard Work    Stability/Clinical Decision Making Stable/Uncomplicated    Rehab Potential Good    PT Frequency 2x / week    PT Duration 6 weeks    PT Treatment/Interventions ADLs/Self Care Home Management;Aquatic Therapy;Cryotherapy;Electrical Stimulation;Iontophoresis 4mg /ml Dexamethasone;Moist  Heat;Ultrasound;Functional mobility training;Therapeutic activities;Therapeutic exercise;Balance training;Neuromuscular re-education;Patient/family  education;Manual techniques;Dry needling;Taping    PT Next Visit Plan review HEP;  piriformis; progress with core stabilization and strengthening; increase activity level; manual work and modalities as indicated    PT Home Exercise Plan [email or web address]    Consulted and Agree with Plan of Care Patient             Patient will benefit from skilled therapeutic intervention in order to improve the following deficits and impairments:  Decreased range of motion, Increased fascial restricitons, Pain, Hypomobility, Improper body mechanics, Impaired flexibility, Decreased mobility, Decreased strength, Postural dysfunction, Obesity  Visit Diagnosis: Chronic bilateral low back pain without sciatica  Flank pain  Other symptoms and signs involving the musculoskeletal system     Problem List Patient Active Problem List   Diagnosis Date Noted   Diabetic neuropathy (HCC) 07/31/2020   Abdominal aortic aneurysm (HCC) 07/31/2020   Insomnia 07/31/2020   GERD (gastroesophageal reflux disease) 01/09/2020   Partial nontraumatic tear of right rotator cuff 08/22/2019   Impingement syndrome of right shoulder 08/22/2019   Bilateral shoulder pain 08/02/2019   Interstitial pulmonary disease (HCC) 06/29/2018   Hypertension associated with diabetes (HCC) 07/13/2017   Benign essential tremor 07/13/2017   Kidney cyst, acquired 01/24/2017   Seborrheic keratoses 04/01/2016   Diverticulosis of colon without hemorrhage 02/26/2016   Anxiety state 10/02/2015   BPH (benign prostatic hyperplasia) 07/31/2015   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   Vitamin D deficiency 03/06/2015   History of pancreatitis 03/06/2015   Lumbar spondylosis 03/04/2015   Type II diabetes mellitus with complication (HCC) 05/15/2014   Obstructive sleep apnea 05/15/2014   Hyperlipidemia  05/15/2014   Glaucoma suspect 10/21/2011   Mayer Camel, PTA 11/06/20 5:47 PM  [facility] [place] [address] [place], [place], [address] Phone: [phone]   Fax:  [phone]  Name: HUCK ASHWORTH [name] MRN: [id number] Date of Birth: Oct 19, 1950

## 2020-11-06 NOTE — Patient Instructions (Addendum)
   Self care: ball massage / TENS use to Rt low back.  Standing stretch with hands on cabinet (two arms overhead,  one arm with side stretch) Start walking program - 10 min of light walking.  Use lumbar support in car and at desk.  Avoid Rt leaning in sitting position (in car and at desk).

## 2020-11-10 ENCOUNTER — Ambulatory Visit (INDEPENDENT_AMBULATORY_CARE_PROVIDER_SITE_OTHER): Payer: Medicare Other | Admitting: Rehabilitative and Restorative Service Providers"

## 2020-11-10 ENCOUNTER — Other Ambulatory Visit: Payer: Self-pay

## 2020-11-10 DIAGNOSIS — R109 Unspecified abdominal pain: Secondary | ICD-10-CM | POA: Diagnosis not present

## 2020-11-10 DIAGNOSIS — M545 Low back pain, unspecified: Secondary | ICD-10-CM

## 2020-11-10 DIAGNOSIS — G8929 Other chronic pain: Secondary | ICD-10-CM

## 2020-11-10 DIAGNOSIS — R29898 Other symptoms and signs involving the musculoskeletal system: Secondary | ICD-10-CM

## 2020-11-10 NOTE — Patient Instructions (Signed)
Access Code: [email or web address] URL: [email or web address] Date: 11/10/2020 Prepared by: Margretta Ditty  Exercises Prone Quadriceps Stretch with Strap - 2 x daily - 7 x weekly - 1 sets - 3 reps - 30 sec hold Hooklying Hamstring Stretch with Strap - 2 x daily - 7 x weekly - 1 sets - 3 reps - 30 sec hold Supine ITB Stretch with Strap - 2 x daily - 7 x weekly - 1 sets - 3 reps - 30 sec hold Standing Lumbar Extension - 2 x daily - 7 x weekly - 1 sets - 2-3 reps - 2-3 sec hold Seated Sidebending - 2 x daily - 7 x weekly - 1 sets - 3 reps - 15-20 sec hold Sit to Stand - 2 x daily - 7 x weekly - 1 sets - 10 reps - 3-5 sec hold Sidelying Thoracic Rotation with Open Book - 1 x daily - 7 x weekly - 1 sets - 5 reps - 5-10 seconds hold Seated Hamstring Stretch with Chair - 2 x daily - 7 x weekly - 1 sets - 3 reps - 20-30 seconds hold  Patient Education Office Posture

## 2020-11-10 NOTE — Therapy (Signed)
[facility] [place] [address] [place], [place], [address] Phone: [phone]   Fax:  [phone]  Physical Therapy Treatment  Patient Details  Name: Gregory BRIDGEWATER [name] MRN: [id number] Date of Birth: 09-Mar-1951 Referring Provider (PT): Dr Benjamin Stain   Encounter Date: 11/10/2020   PT End of Session - 11/10/20 1437     Visit Number 3    Number of Visits 12    Date for PT Re-Evaluation 12/15/20    PT Start Time 1430    PT Stop Time 1510    PT Time Calculation (min) 40 min    Activity Tolerance Patient tolerated treatment well    Behavior During Therapy [facility] for tasks assessed/performed             Past Medical History:  Diagnosis Date   Arthritis    Chest pain    a. Normal cors 2001. b. Neg stress test 2012, 05/2014.   Diabetes mellitus without complication (HCC)    GERD (gastroesophageal reflux disease)    Hyperlipidemia    Hypertension    Hypertensive response to exercise    Hypothyroidism    Obesity    Obstructive sleep apnea    Currently untreated    PONV (postoperative nausea and vomiting)     Past Surgical History:  Procedure Laterality Date   CARDIAC CATHETERIZATION     CERVICAL FUSION     HERNIA REPAIR     KNEE ARTHROSCOPY  1984   both knee   SHOULDER ARTHROSCOPY WITH SUBACROMIAL DECOMPRESSION Right 09/13/2019   Procedure: RIGHT SHOULDER ARTHROSCOPY WITH EXTENSIVE DEBRIDEMENT, SUBACROMIAL DECOMPRESSION,;  Surgeon: Kathryne Hitch, MD;  Location: [facility];  Service: Orthopedics;  Laterality: Right;    There were no vitals filed for this visit.   Subjective Assessment - 11/10/20 1433     Subjective The patient is feeling better than last week.  He has a flare up of R QL pain that has not resolved.  He notes if he changes his activity, it impacts his low back.  He is helping his daughter who was hit by a car in late March and is now in SNF.  He has been driving more since  his daughter's accident in order to help with his family.  In addition, his son had a transtibial amputation in the last month.  All of these family changes are increasing stress.    Pertinent History AODM; HTN; cervical discetomy 1997, 2010; chronic LBP since 1978; arthris; B shd dislocation; sprains; RC sx 4/21    Patient Stated Goals help decrease pain    Currently in Pain? Yes    Pain Score 4     Pain Location Back    Pain Orientation Right;Lower    Pain Descriptors / Indicators Aching;Sore    Pain Type Acute pain;Chronic pain    Pain Onset More than a month ago    Pain Frequency Constant    Aggravating Factors  sitting    Pain Relieving Factors heat                [facility] PT Assessment - 11/10/20 1438       Assessment   Medical Diagnosis Rt flank pain; mid to low back pain    Referring Provider (PT) Dr Benjamin Stain    Onset Date/Surgical Date 07/29/20    Hand Dominance Right  [facility] Adult PT Treatment/Exercise - 11/10/20 1438       Exercises   Exercises Lumbar;Knee/Hip      Lumbar Exercises: Stretches   Active Hamstring Stretch Right;Left;1 rep;30 seconds    Active Hamstring Stretch Limitations tried sitting wiht foot on floor-- felt in low back; changed to 2 chairs.    Lower Trunk Rotation 5 reps    Quad Stretch Right;Left;1 rep;60 seconds    Other Lumbar Stretch Exercise lumbar rotation x 10 reps supine    Other Lumbar Stretch Exercise standing modified forward fold with UEs on elevated mat; attempted trunk roation, but that does increase low back pain      Lumbar Exercises: Aerobic   Nustep L4 x 5 minutes LEs only      Lumbar Exercises: Supine   Isometric Hip Flexion 5 reps;5 seconds    Isometric Hip Flexion Limitations right and left sides    Other Supine Lumbar Exercises glut activation pressing into soft ball with foot x 5 reps x 5 seconds R and L sides.    Other Supine Lumbar Exercises hip hike/depression with leg  supported on corageous ball      Knee/Hip Exercises: Standing   Heel Raises Right;Left;10 reps    Hip Abduction Stengthening;Right;Left;10 reps    Abduction Limitations sidestepping    Lateral Step Up Right;Left;10 reps                    PT Education - 11/10/20 1514     Education Details HEP    Person(s) Educated Patient    Methods Explanation;Demonstration;Handout    Comprehension Verbalized understanding;Returned demonstration                 PT Long Term Goals - 11/03/20 1343       PT LONG TERM GOAL #1   Title Increase trunk and LE ROM/mobility allowing patient to move to change positions with greater ease and decreased pain    Time 6    Period Weeks    Status New    Target Date 12/15/20      PT LONG TERM GOAL #2   Title Decrease pain with functional activities allowing patient to sit or stand for 1 hour without increased pain    Time 6    Period Weeks    Status New    Target Date 12/15/20      PT LONG TERM GOAL #3   Title Patient to demonstrate and/or verbalize proper body mechanics for transitional movements and lifting    Time 6    Period Weeks    Status New    Target Date 12/15/20      PT LONG TERM GOAL #4   Title Independent in HEP including aquatic exercise as indicated    Time 6    Period Weeks    Status New    Target Date 12/15/20      PT LONG TERM GOAL #5   Title Improve functional limitations score to 49    Time 6    Status New    Target Date 12/15/20                   Plan - 11/10/20 1645     Clinical Impression Statement The patient tolerates stretching and strengthening well today.  He notes he plans to begin home walking program and PT and patient discussed short duration to begin.  Plan to continue progressing to LTGs.    PT Treatment/Interventions ADLs/Self Care  Home Management;Aquatic Therapy;Cryotherapy;Electrical Stimulation;Iontophoresis 4mg /ml Dexamethasone;Moist Heat;Ultrasound;Functional mobility  training;Therapeutic activities;Therapeutic exercise;Balance training;Neuromuscular re-education;Patient/family education;Manual techniques;Dry needling;Taping    PT Next Visit Plan review HEP, piriformis stretching, core stability, strengthening, increase activity level, manual work and modalities as indicated.    PT Home Exercise Plan [email or web address]    Consulted and Agree with Plan of Care Patient             Patient will benefit from skilled therapeutic intervention in order to improve the following deficits and impairments:     Visit Diagnosis: Chronic bilateral low back pain without sciatica  Flank pain  Other symptoms and signs involving the musculoskeletal system     Problem List Patient Active Problem List   Diagnosis Date Noted   Diabetic neuropathy (HCC) 07/31/2020   Abdominal aortic aneurysm (HCC) 07/31/2020   Insomnia 07/31/2020   GERD (gastroesophageal reflux disease) 01/09/2020   Partial nontraumatic tear of right rotator cuff 08/22/2019   Impingement syndrome of right shoulder 08/22/2019   Bilateral shoulder pain 08/02/2019   Interstitial pulmonary disease (HCC) 06/29/2018   Hypertension associated with diabetes (HCC) 07/13/2017   Benign essential tremor 07/13/2017   Kidney cyst, acquired 01/24/2017   Seborrheic keratoses 04/01/2016   Diverticulosis of colon without hemorrhage 02/26/2016   Anxiety state 10/02/2015   BPH (benign prostatic hyperplasia) 07/31/2015   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   Vitamin D deficiency 03/06/2015   History of pancreatitis 03/06/2015   Lumbar spondylosis 03/04/2015   Type II diabetes mellitus with complication (HCC) 05/15/2014   Obstructive sleep apnea 05/15/2014   Hyperlipidemia 05/15/2014   Glaucoma suspect 10/21/2011    Maks Cavallero, PT 11/10/2020, 4:49 PM  [facility] [place] [address] [place], [place], [place] Phone: [phone]   Fax:   [phone]  Name: Gregory KINTZ [name] MRN: Edwyna Ready Date of Birth: 1951/01/22

## 2020-11-12 ENCOUNTER — Other Ambulatory Visit: Payer: Self-pay

## 2020-11-12 ENCOUNTER — Encounter: Payer: Self-pay | Admitting: Rehabilitative and Restorative Service Providers"

## 2020-11-12 ENCOUNTER — Ambulatory Visit (INDEPENDENT_AMBULATORY_CARE_PROVIDER_SITE_OTHER): Payer: Medicare Other | Admitting: Rehabilitative and Restorative Service Providers"

## 2020-11-12 DIAGNOSIS — R109 Unspecified abdominal pain: Secondary | ICD-10-CM | POA: Diagnosis not present

## 2020-11-12 DIAGNOSIS — R29898 Other symptoms and signs involving the musculoskeletal system: Secondary | ICD-10-CM | POA: Diagnosis not present

## 2020-11-12 DIAGNOSIS — G8929 Other chronic pain: Secondary | ICD-10-CM

## 2020-11-12 DIAGNOSIS — M545 Low back pain, unspecified: Secondary | ICD-10-CM

## 2020-11-12 NOTE — Therapy (Addendum)
[facility] [place] [address] [place], [place], [address] Phone: [phone]   Fax:  [phone]  Physical Therapy Treatment  Patient Details  Name: Gregory MESSINA [name] MRN: [id number] Date of Birth: [birth year] Referring Provider (PT): Dr Benjamin Stain   Encounter Date: 11/12/2020   PT End of Session - 11/12/20 0801     Visit Number 4    Number of Visits 12    Date for PT Re-Evaluation 12/15/20    PT Start Time 0758    PT Stop Time 0843    PT Time Calculation (min) 45 min    Activity Tolerance Patient tolerated treatment well             Past Medical History:  Diagnosis Date   Arthritis    Chest pain    a. Normal cors 2001. b. Neg stress test 2012, 05/2014.   Diabetes mellitus without complication (HCC)    GERD (gastroesophageal reflux disease)    Hyperlipidemia    Hypertension    Hypertensive response to exercise    Hypothyroidism    Obesity    Obstructive sleep apnea    Currently untreated    PONV (postoperative nausea and vomiting)     Past Surgical History:  Procedure Laterality Date   CARDIAC CATHETERIZATION     CERVICAL FUSION     HERNIA REPAIR     KNEE ARTHROSCOPY  1984   both knee   SHOULDER ARTHROSCOPY WITH SUBACROMIAL DECOMPRESSION Right 09/13/2019   Procedure: RIGHT SHOULDER ARTHROSCOPY WITH EXTENSIVE DEBRIDEMENT, SUBACROMIAL DECOMPRESSION,;  Surgeon: Kathryne Hitch, MD;  Location: [facility];  Service: Orthopedics;  Laterality: Right;    There were no vitals filed for this visit.   Subjective Assessment - 11/12/20 0802     Subjective Cramping in legs in the last two nights - Monday night in the front of leg to ankle last night lateral hamstrings and adductors. Has been stretching and walking - nothing new.    Currently in Pain? Yes    Pain Score 4     Pain Location Back    Pain Orientation Right;Lower    Pain Descriptors / Indicators Aching;Sore    Pain Type  Acute pain;Chronic pain                [facility] PT Assessment - 11/12/20 0001       Assessment   Medical Diagnosis Rt flank pain; mid to low back pain    Referring Provider (PT) Dr Benjamin Stain    Onset Date/Surgical Date 07/29/20    Hand Dominance Right    Next MD Visit 12/09/20    Prior Therapy here for LBP; no rehab for shoulder sx      AROM   Lumbar Flexion 70% painful LB to Rt flank    Lumbar Extension 40%    Lumbar - Right Side Bend 50% pain    Lumbar - Left Side Bend 50 % pain    Lumbar - Right Rotation 50% painful    Lumbar - Left Rotation 50% mild pain      Palpation   Palpation comment muscular tightness Rt psoas; Rt lumbar paraspinals, QL, lats, piriformis, gluts - less tightness Lt lumbar and posterior hip                           [facility] Adult PT Treatment/Exercise - 11/12/20 0001       Lumbar Exercises: Stretches   Gastroc  Stretch Right;Left;3 reps;30 seconds   on incline board   Gastroc Stretch Limitations stretch for ankle dorsiflexors standing and repeated in sitting to stretch anterior ankle 20 sec x 3 each LE    Other Lumbar Stretch Exercise lumbar rotation x 3 reps supine      Lumbar Exercises: Aerobic   Nustep L5 x 6 minutes UE's 10      Lumbar Exercises: Standing   Wall Slides 10 reps   10 sec hold quarter squat   Row Strengthening;Both;10 reps;Theraband    Theraband Level (Row) Level 4 (Blue)    Shoulder Extension Strengthening;Both;10 reps;Theraband    Theraband Level (Shoulder Extension) Level 4 (Blue)    Other Standing Lumbar Exercises forward step ups Lt/Rt 6 inch step x 10 each UE hold for safety    Other Standing Lumbar Exercises side steps Rt/Lt x 4 sets x 10 ft      Lumbar Exercises: Seated   Other Seated Lumbar Exercises lateral trunk flexion stretch to Lt x 20 sec x 3 reps repeated at end of session x 3 reps      Lumbar Exercises: Supine   Bridge 10 reps;5 seconds                    PT Education -  11/12/20 0832     Education Details HEP    Person(s) Educated Patient    Methods Explanation;Demonstration;Tactile cues;Verbal cues;Handout    Comprehension Verbalized understanding;Returned demonstration;Verbal cues required;Tactile cues required                 PT Long Term Goals - 11/03/20 1343       PT LONG TERM GOAL #1   Title Increase trunk and LE ROM/mobility allowing patient to move to change positions with greater ease and decreased pain    Time 6    Period Weeks    Status New    Target Date 12/15/20      PT LONG TERM GOAL #2   Title Decrease pain with functional activities allowing patient to sit or stand for 1 hour without increased pain    Time 6    Period Weeks    Status New    Target Date 12/15/20      PT LONG TERM GOAL #3   Title Patient to demonstrate and/or verbalize proper body mechanics for transitional movements and lifting    Time 6    Period Weeks    Status New    Target Date 12/15/20      PT LONG TERM GOAL #4   Title Independent in HEP including aquatic exercise as indicated    Time 6    Period Weeks    Status New    Target Date 12/15/20      PT LONG TERM GOAL #5   Title Improve functional limitations score to 49    Time 6    Status New    Target Date 12/15/20                   Plan - 11/12/20 0804     Clinical Impression Statement Patient reports continued pain in the Rt LB with variable intensity. Reports that he reaches back with the Rt UE after going to the bathroom - ask patient to try to use Lt UE. He been walking at church but not walking without stopping. To [facility]. Reviewed and added exercises.    Rehab Potential Good  PT Frequency 2x / week    PT Duration 6 weeks    PT Treatment/Interventions ADLs/Self Care Home Management;Aquatic Therapy;Cryotherapy;Electrical Stimulation;Iontophoresis 4mg /ml Dexamethasone;Moist Heat;Ultrasound;Functional mobility training;Therapeutic  activities;Therapeutic exercise;Balance training;Neuromuscular re-education;Patient/family education;Manual techniques;Dry needling;Taping    PT Next Visit Plan review HEP, piriformis stretching, core stability, strengthening, increase activity level, manual work and modalities as indicated.    PT Home Exercise Plan [email or web address]    Consulted and Agree with Plan of Care Patient             Patient will benefit from skilled therapeutic intervention in order to improve the following deficits and impairments:     Visit Diagnosis: Chronic bilateral low back pain without sciatica  Flank pain  Other symptoms and signs involving the musculoskeletal system     Problem List Patient Active Problem List   Diagnosis Date Noted   Diabetic neuropathy (HCC) 07/31/2020   Abdominal aortic aneurysm (HCC) 07/31/2020   Insomnia 07/31/2020   GERD (gastroesophageal reflux disease) 01/09/2020   Partial nontraumatic tear of right rotator cuff 08/22/2019   Impingement syndrome of right shoulder 08/22/2019   Bilateral shoulder pain 08/02/2019   Interstitial pulmonary disease (HCC) 06/29/2018   Hypertension associated with diabetes (HCC) 07/13/2017   Benign essential tremor 07/13/2017   Kidney cyst, acquired 01/24/2017   Seborrheic keratoses 04/01/2016   Diverticulosis of colon without hemorrhage 02/26/2016   Anxiety state 10/02/2015   BPH (benign prostatic hyperplasia) 07/31/2015   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   Vitamin D deficiency 03/06/2015   History of pancreatitis 03/06/2015   Lumbar spondylosis 03/04/2015   Type II diabetes mellitus with complication (HCC) 05/15/2014   Obstructive sleep apnea 05/15/2014   Hyperlipidemia 05/15/2014   Glaucoma suspect 10/21/2011    Salem Mastrogiovanni 10/23/2011 PT, MPH  11/12/2020, 10:11 AM  [facility] [address] [place] [place], [place], [place] Phone: [phone]   Fax:  [phone]  Name:  YAZID POP [name] MRN: Edwyna Ready Date of Birth: Oct 04, 1950

## 2020-11-12 NOTE — Patient Instructions (Signed)
Access Code: [email or web address]: [email or web address]: 06/15/2022Prepared by: Timur Nibert HoltExercises  Prone Quadriceps Stretch with Strap - 2 x daily - 7 x weekly - 1 sets - 3 reps - 30 sec hold  Hooklying Hamstring Stretch with Strap - 2 x daily - 7 x weekly - 1 sets - 3 reps - 30 sec hold  Supine ITB Stretch with Strap - 2 x daily - 7 x weekly - 1 sets - 3 reps - 30 sec hold  Standing Lumbar Extension - 2 x daily - 7 x weekly - 1 sets - 2-3 reps - 2-3 sec hold  Seated Sidebending - 2 x daily - 7 x weekly - 1 sets - 3 reps - 15-20 sec hold  Sit to Stand - 2 x daily - 7 x weekly - 1 sets - 10 reps - 3-5 sec hold  Sidelying Thoracic Rotation with Open Book - 1 x daily - 7 x weekly - 1 sets - 5 reps - 5-10 seconds hold  Seated Hamstring Stretch with Chair - 2 x daily - 7 x weekly - 1 sets - 3 reps - 20-30 seconds hold  Seated Anterior Tibialis Stretch - 2 x daily - 7 x weekly - 1 sets - 3 reps - 30 sec hold  Gastroc Stretch on Wall - 2 x daily - 7 x weekly - 1 sets - 3 reps - 30 sec hold  Seated Sidebending - 2 x daily - 7 x weekly - 1 sets - 3 reps - 30 sec hold  Wall Quarter Squat - 2 x daily - 7 x weekly - 1-2 sets - 10 reps - 5-10 sec hold

## 2020-11-13 ENCOUNTER — Ambulatory Visit (INDEPENDENT_AMBULATORY_CARE_PROVIDER_SITE_OTHER): Payer: Medicare Other | Admitting: Rehabilitative and Restorative Service Providers"

## 2020-11-13 ENCOUNTER — Encounter: Payer: Self-pay | Admitting: Rehabilitative and Restorative Service Providers"

## 2020-11-13 DIAGNOSIS — R109 Unspecified abdominal pain: Secondary | ICD-10-CM

## 2020-11-13 DIAGNOSIS — G8929 Other chronic pain: Secondary | ICD-10-CM | POA: Diagnosis not present

## 2020-11-13 DIAGNOSIS — R29898 Other symptoms and signs involving the musculoskeletal system: Secondary | ICD-10-CM

## 2020-11-13 DIAGNOSIS — M545 Low back pain, unspecified: Secondary | ICD-10-CM | POA: Diagnosis not present

## 2020-11-13 DIAGNOSIS — R10A0 Flank pain, unspecified side: Secondary | ICD-10-CM

## 2020-11-13 NOTE — Therapy (Signed)
[facility] [place] [address] [place], [place], [address] Phone: [phone]   Fax:  [phone]  Physical Therapy Treatment  Patient Details  Name: Gregory HUMBLE [name] MRN: [id number] Date of Birth: 04-10-1951 Referring Provider (PT): Dr Benjamin Stain   Encounter Date: 11/13/2020   PT End of Session - 11/13/20 1706     Visit Number 5    Number of Visits 12    Date for PT Re-Evaluation 12/15/20    PT Start Time 1700    PT Stop Time 1748    PT Time Calculation (min) 48 min    Activity Tolerance Patient tolerated treatment well             Past Medical History:  Diagnosis Date   Arthritis    Chest pain    a. Normal cors 2001. b. Neg stress test 2012, 05/2014.   Diabetes mellitus without complication (HCC)    GERD (gastroesophageal reflux disease)    Hyperlipidemia    Hypertension    Hypertensive response to exercise    Hypothyroidism    Obesity    Obstructive sleep apnea    Currently untreated    PONV (postoperative nausea and vomiting)     Past Surgical History:  Procedure Laterality Date   CARDIAC CATHETERIZATION     CERVICAL FUSION     HERNIA REPAIR     KNEE ARTHROSCOPY  1984   both knee   SHOULDER ARTHROSCOPY WITH SUBACROMIAL DECOMPRESSION Right 09/13/2019   Procedure: RIGHT SHOULDER ARTHROSCOPY WITH EXTENSIVE DEBRIDEMENT, SUBACROMIAL DECOMPRESSION,;  Surgeon: Kathryne Hitch, MD;  Location: [facility];  Service: Orthopedics;  Laterality: Right;    There were no vitals filed for this visit.   Subjective Assessment - 11/13/20 1706     Subjective Tight in the back of Lt leg - doing a lot of driving today. Back is not hurting today - but he has not had to go to the bathroom for BM yet today.    Currently in Pain? No/denies    Pain Score 0-No pain    Pain Location Back                               [facility] Adult PT Treatment/Exercise - 11/13/20 0001        Lumbar Exercises: Stretches   Passive Hamstring Stretch Left;Right;2 reps;30 seconds   sitting   Lower Trunk Rotation 5 reps    ITB Stretch Right;Left;2 reps;30 seconds    Gastroc Stretch Right;Left;3 reps;30 seconds   on incline board   Gastroc Stretch Limitations stretch for ankle dorsiflexors standing and repeated in sitting to stretch anterior ankle 20 sec x 3 each LE    Other Lumbar Stretch Exercise adductor stretch supine with strap LE in hip flexion 20-30 sec x 3 reps      Lumbar Exercises: Aerobic   Nustep L5 x 6 minutes UE's 10      Lumbar Exercises: Standing   Row Strengthening;Both;10 reps;Theraband    Theraband Level (Row) Level 4 (Blue)    Row Limitations bow and arrow x 10 each side blue TB    Shoulder Extension Strengthening;Both;10 reps;Theraband    Theraband Level (Shoulder Extension) Level 4 (Blue)    Other Standing Lumbar Exercises forward step ups Lt/Rt 6 inch step x 10 each UE hold for safety    Other Standing Lumbar Exercises side steps Rt/Lt x 4 sets x 10 ft  Lumbar Exercises: Seated   Other Seated Lumbar Exercises lateral trunk flexion stretch to Lt x 20 sec x 3                         PT Long Term Goals - 11/03/20 1343       PT LONG TERM GOAL #1   Title Increase trunk and LE ROM/mobility allowing patient to move to change positions with greater ease and decreased pain    Time 6    Period Weeks    Status New    Target Date 12/15/20      PT LONG TERM GOAL #2   Title Decrease pain with functional activities allowing patient to sit or stand for 1 hour without increased pain    Time 6    Period Weeks    Status New    Target Date 12/15/20      PT LONG TERM GOAL #3   Title Patient to demonstrate and/or verbalize proper body mechanics for transitional movements and lifting    Time 6    Period Weeks    Status New    Target Date 12/15/20      PT LONG TERM GOAL #4   Title Independent in HEP including aquatic exercise as indicated     Time 6    Period Weeks    Status New    Target Date 12/15/20      PT LONG TERM GOAL #5   Title Improve functional limitations score to 49    Time 6    Status New    Target Date 12/15/20                   Plan - 11/13/20 1751     Clinical Impression Statement Patient presents to clinic today reporting that his LB is pain free. He still has muscular tightness in the Lt posterior thigh which resolves with stretching. Continued with strengthening and core stabilization. Progressing well toward stated goals of therapy.    Rehab Potential Good    PT Frequency 2x / week    PT Duration 6 weeks    PT Treatment/Interventions ADLs/Self Care Home Management;Aquatic Therapy;Cryotherapy;Electrical Stimulation;Iontophoresis 4mg /ml Dexamethasone;Moist Heat;Ultrasound;Functional mobility training;Therapeutic activities;Therapeutic exercise;Balance training;Neuromuscular re-education;Patient/family education;Manual techniques;Dry needling;Taping    PT Next Visit Plan review HEP, piriformis stretching, core stability, strengthening, increase activity level, manual work and modalities as indicated.    PT Home Exercise Plan [email or web address]    Consulted and Agree with Plan of Care Patient             Patient will benefit from skilled therapeutic intervention in order to improve the following deficits and impairments:     Visit Diagnosis: Chronic bilateral low back pain without sciatica  Flank pain  Other symptoms and signs involving the musculoskeletal system     Problem List Patient Active Problem List   Diagnosis Date Noted   Diabetic neuropathy (HCC) 07/31/2020   Abdominal aortic aneurysm (HCC) 07/31/2020   Insomnia 07/31/2020   GERD (gastroesophageal reflux disease) 01/09/2020   Partial nontraumatic tear of right rotator cuff 08/22/2019   Impingement syndrome of right shoulder 08/22/2019   Bilateral shoulder pain 08/02/2019   Interstitial pulmonary disease (HCC) 06/29/2018    Hypertension associated with diabetes (HCC) 07/13/2017   Benign essential tremor 07/13/2017   Kidney cyst, acquired 01/24/2017   Seborrheic keratoses 04/01/2016   Diverticulosis of colon without hemorrhage 02/26/2016   Anxiety state 10/02/2015   BPH (benign  prostatic hyperplasia) 07/31/2015   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   Vitamin D deficiency 03/06/2015   History of pancreatitis 03/06/2015   Lumbar spondylosis 03/04/2015   Type II diabetes mellitus with complication (HCC) 05/15/2014   Obstructive sleep apnea 05/15/2014   Hyperlipidemia 05/15/2014   Glaucoma suspect 10/21/2011    Phat Dalton Rober Minion PT, MPH  11/13/2020, 5:56 PM  [facility] [address] [place] [address] [place], [place], [address] Phone: [phone]   Fax:  [phone]  Name: Gregory CHOPIN [name] MRN: [id number] Date of Birth: 1951-01-31

## 2020-11-18 ENCOUNTER — Ambulatory Visit (INDEPENDENT_AMBULATORY_CARE_PROVIDER_SITE_OTHER): Payer: Medicare Other | Admitting: Physical Therapy

## 2020-11-18 ENCOUNTER — Other Ambulatory Visit: Payer: Self-pay

## 2020-11-18 DIAGNOSIS — R109 Unspecified abdominal pain: Secondary | ICD-10-CM | POA: Diagnosis not present

## 2020-11-18 DIAGNOSIS — R29898 Other symptoms and signs involving the musculoskeletal system: Secondary | ICD-10-CM | POA: Diagnosis not present

## 2020-11-18 DIAGNOSIS — M545 Low back pain, unspecified: Secondary | ICD-10-CM | POA: Diagnosis not present

## 2020-11-18 DIAGNOSIS — G8929 Other chronic pain: Secondary | ICD-10-CM | POA: Diagnosis not present

## 2020-11-18 NOTE — Patient Instructions (Signed)
   Aquatic Therapy: What to Expect!  Where:  [facility] at [address] [place], [place] [address] [phone]           How to Prepare: Please make sure you drink 8 ounces of water about one hour prior to your pool session A caregiver must attend the entire session with the patient (unless your primary therapists feels this is not necessary). The caregiver will be responsible for assisting with dressing as well as any toileting needs.  Please arrive IN YOUR SUIT and a few minutes prior to your appointment - this helps to avoid delays in starting your session. Please make sure to attend to any toileting needs prior to entering the pool. Once on the pool deck your therapist will ask you to sign the Patient  Consent and Assignment of Benefits form. Your therapist may take your blood pressure prior to, during and after your session if indicated. We usually try and create a home exercise program based on activities we do in the pool.  Please be thinking about who might be able to assist you in the pool should you want to participate in an aquatic home exercise program at the time of discharge.  Some patients do not want to or do not have the ability to participate in an aquatic home program - this is not a barrier in any way to you participating in aquatic therapy as part of your current therapy plan!  Appointments:  All sessions are 45 minutes  About the pool: Entering the pool Your therapist will assist you; there are two ways to enter the pool - stairs or a mechanical lift. Your therapist will determine the most appropriate way for you. Water temperature is usually around 90-91.  There is a lap pool with a temperature around 84 There may be other swimmers in the pool at the same time.   Contact Info:             To cancel appointment, please call [facility] at [phone] If you are running late, please call [facility] at  [phone]                   

## 2020-11-18 NOTE — Therapy (Signed)
[facility] [place] [address] [place], [place], [address] Phone: [phone]   Fax:  [phone]  Physical Therapy Treatment  Patient Details  Name: Gregory West [name] MRN: [id number] Date of Birth: [birth year] Referring Provider (PT): Dr Benjamin Stain   Encounter Date: 11/18/2020   PT End of Session - 11/18/20 0808     Visit Number 6    Number of Visits 12    Date for PT Re-Evaluation 12/15/20    PT Start Time 0802    PT Stop Time 0840    PT Time Calculation (min) 38 min    Activity Tolerance Patient tolerated treatment well    Behavior During Therapy [facility] for tasks assessed/performed             Past Medical History:  Diagnosis Date   Arthritis    Chest pain    a. Normal cors 2001. b. Neg stress test 2012, 05/2014.   Diabetes mellitus without complication (HCC)    GERD (gastroesophageal reflux disease)    Hyperlipidemia    Hypertension    Hypertensive response to exercise    Hypothyroidism    Obesity    Obstructive sleep apnea    Currently untreated    PONV (postoperative nausea and vomiting)     Past Surgical History:  Procedure Laterality Date   CARDIAC CATHETERIZATION     CERVICAL FUSION     HERNIA REPAIR     KNEE ARTHROSCOPY  1984   both knee   SHOULDER ARTHROSCOPY WITH SUBACROMIAL DECOMPRESSION Right 09/13/2019   Procedure: RIGHT SHOULDER ARTHROSCOPY WITH EXTENSIVE DEBRIDEMENT, SUBACROMIAL DECOMPRESSION,;  Surgeon: Kathryne Hitch, MD;  Location: [facility];  Service: Orthopedics;  Laterality: Right;    There were no vitals filed for this visit.   Subjective Assessment - 11/18/20 0805     Subjective Pt reports his back pain is lower than it has been in a while.  He started wiping with other hand during hygiene.  He is doing overhead stretches, sit to stand, and walking.  He reports his pain was a 9/10 when starting therapy- it is now 3/10 on average.    Pertinent History  AODM; HTN; cervical discetomy 1997, 2010; chronic LBP since 1978; arthris; B shd dislocation; sprains; RC sx 4/21    Currently in Pain? Yes    Pain Score 3     Pain Location Back    Pain Orientation Right;Lower    Aggravating Factors  prolonged sitting, transitional movements    Pain Relieving Factors heat                [facility] PT Assessment - 11/18/20 0001       Assessment   Medical Diagnosis Rt flank pain; mid to low back pain    Referring Provider (PT) Dr Benjamin Stain    Onset Date/Surgical Date 07/29/20    Hand Dominance Right    Next MD Visit 12/09/20    Prior Therapy here for LBP; no rehab for shoulder sx              [facility] Adult PT Treatment/Exercise - 11/18/20 0001       Lumbar Exercises: Stretches   Passive Hamstring Stretch Left;Right;2 reps;30 seconds   sitting   Single Knee to Chest Stretch Right;Left;2 reps;10 seconds    Lower Trunk Rotation 5 reps    Standing Side Bend Limitations seated side bend with arm overhead x 3 reps of 10 sec each side.    Piriformis  Stretch Right;Left;2 reps;20 seconds   supine, fig 4   Gastroc Stretch Right;Left;2 reps;20 seconds    Other Lumbar Stretch Exercise adductor stretch in standing with arms leaning on counter x 3 reps of 10 sec each side. then with heel twist (his version of martial arts)      Lumbar Exercises: Aerobic   Nustep L5: 5 min, legs only.    Other Aerobic Exercise walking 1-3 laps in between exercises to assess response to exercises.      Lumbar Exercises: Seated   Sit to Stand 5 reps    Other Seated Lumbar Exercises then sit to stand with side step R/L x 6 reps in total.      Lumbar Exercises: Supine   Bridge 10 reps;3 seconds      Knee/Hip Exercises: Standing   Stairs 20 steps (4-6") without rail                    PT Education - 11/18/20 0843     Education Details pool info    Person(s) Educated Patient    Methods Explanation;Handout    Comprehension Verbalized understanding                  PT Long Term Goals - 11/03/20 1343       PT LONG TERM GOAL #1   Title Increase trunk and LE ROM/mobility allowing patient to move to change positions with greater ease and decreased pain    Time 6    Period Weeks    Status New    Target Date 12/15/20      PT LONG TERM GOAL #2   Title Decrease pain with functional activities allowing patient to sit or stand for 1 hour without increased pain    Time 6    Period Weeks    Status New    Target Date 12/15/20      PT LONG TERM GOAL #3   Title Patient to demonstrate and/or verbalize proper body mechanics for transitional movements and lifting    Time 6    Period Weeks    Status New    Target Date 12/15/20      PT LONG TERM GOAL #4   Title Independent in HEP including aquatic exercise as indicated    Time 6    Period Weeks    Status New    Target Date 12/15/20      PT LONG TERM GOAL #5   Title Improve functional limitations score to 49    Time 6    Status New    Target Date 12/15/20                   Plan - 11/18/20 0815     Clinical Impression Statement Pt's shins hurting less after adding stretch and adjusting his body mechanics with sit to stand. He able to complete bridge with less pain; can complete set of 10 prior to pain increasing.  Pt reporting overall reduction in pain with mobility since starting therapy.  Progressing well towards LTGs.    Comorbidities AODM, HTN, obesity; bilat shoulder problems post Rt RCR 4/21; chronic LBP    Rehab Potential Good    PT Frequency 2x / week    PT Duration 6 weeks    PT Treatment/Interventions ADLs/Self Care Home Management;Aquatic Therapy;Cryotherapy;Electrical Stimulation;Iontophoresis 4mg /ml Dexamethasone;Moist Heat;Ultrasound;Functional mobility training;Therapeutic activities;Therapeutic exercise;Balance training;Neuromuscular re-education;Patient/family education;Manual techniques;Dry needling;Taping    PT Next Visit Plan advance core stability,  strengthening, increase  activity level, manual work and modalities as indicated.    PT Home Exercise Plan [email or web address]    Consulted and Agree with Plan of Care Patient             Patient will benefit from skilled therapeutic intervention in order to improve the following deficits and impairments:  Decreased range of motion, Increased fascial restricitons, Pain, Hypomobility, Improper body mechanics, Impaired flexibility, Decreased mobility, Decreased strength, Postural dysfunction, Obesity  Visit Diagnosis: Chronic bilateral low back pain without sciatica  Flank pain  Other symptoms and signs involving the musculoskeletal system     Problem List Patient Active Problem List   Diagnosis Date Noted   Diabetic neuropathy (HCC) 07/31/2020   Abdominal aortic aneurysm (HCC) 07/31/2020   Insomnia 07/31/2020   GERD (gastroesophageal reflux disease) 01/09/2020   Partial nontraumatic tear of right rotator cuff 08/22/2019   Impingement syndrome of right shoulder 08/22/2019   Bilateral shoulder pain 08/02/2019   Interstitial pulmonary disease (HCC) 06/29/2018   Hypertension associated with diabetes (HCC) 07/13/2017   Benign essential tremor 07/13/2017   Kidney cyst, acquired 01/24/2017   Seborrheic keratoses 04/01/2016   Diverticulosis of colon without hemorrhage 02/26/2016   Anxiety state 10/02/2015   BPH (benign prostatic hyperplasia) 07/31/2015   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   Vitamin D deficiency 03/06/2015   History of pancreatitis 03/06/2015   Lumbar spondylosis 03/04/2015   Type II diabetes mellitus with complication (HCC) 05/15/2014   Obstructive sleep apnea 05/15/2014   Hyperlipidemia 05/15/2014   Glaucoma suspect 10/21/2011   Mayer Camel, PTA 11/18/20 8:44 AM  [facility] [place] [address] [place] [address] [place], [place], [address] Phone: [phone]   Fax:  [phone]  Name: Gregory West  [name] MRN: [id number] Date of Birth: 18-May-1951

## 2020-11-20 ENCOUNTER — Encounter: Payer: Medicare Other | Admitting: Rehabilitative and Restorative Service Providers"

## 2020-11-21 ENCOUNTER — Encounter: Payer: Self-pay | Admitting: Physical Therapy

## 2020-11-21 ENCOUNTER — Other Ambulatory Visit: Payer: Self-pay

## 2020-11-21 ENCOUNTER — Ambulatory Visit (INDEPENDENT_AMBULATORY_CARE_PROVIDER_SITE_OTHER): Payer: Medicare Other | Admitting: Physical Therapy

## 2020-11-21 DIAGNOSIS — M545 Low back pain, unspecified: Secondary | ICD-10-CM

## 2020-11-21 DIAGNOSIS — R29898 Other symptoms and signs involving the musculoskeletal system: Secondary | ICD-10-CM

## 2020-11-21 DIAGNOSIS — R109 Unspecified abdominal pain: Secondary | ICD-10-CM

## 2020-11-21 DIAGNOSIS — G8929 Other chronic pain: Secondary | ICD-10-CM | POA: Diagnosis not present

## 2020-11-21 NOTE — Therapy (Signed)
[facility] [place] [address] [place], [place], [address] Phone: [phone]   Fax:  [phone]  Physical Therapy Treatment  Patient Details  Name: Gregory TERHUNE [name] MRN: [id number] Date of Birth: 09/09/1950 Referring Provider (PT): Dr Benjamin Stain   Encounter Date: 11/21/2020   PT End of Session - 11/21/20 1501     Visit Number 7    Number of Visits 12    Date for PT Re-Evaluation 12/15/20    PT Start Time 1445    PT Stop Time 1525    PT Time Calculation (min) 40 min    Activity Tolerance Patient tolerated treatment well    Behavior During Therapy [facility] for tasks assessed/performed             Past Medical History:  Diagnosis Date   Arthritis    Chest pain    a. Normal cors 2001. b. Neg stress test 2012, 05/2014.   Diabetes mellitus without complication (HCC)    GERD (gastroesophageal reflux disease)    Hyperlipidemia    Hypertension    Hypertensive response to exercise    Hypothyroidism    Obesity    Obstructive sleep apnea    Currently untreated    PONV (postoperative nausea and vomiting)     Past Surgical History:  Procedure Laterality Date   CARDIAC CATHETERIZATION     CERVICAL FUSION     HERNIA REPAIR     KNEE ARTHROSCOPY  1984   both knee   SHOULDER ARTHROSCOPY WITH SUBACROMIAL DECOMPRESSION Right 09/13/2019   Procedure: RIGHT SHOULDER ARTHROSCOPY WITH EXTENSIVE DEBRIDEMENT, SUBACROMIAL DECOMPRESSION,;  Surgeon: Kathryne Hitch, MD;  Location: [facility];  Service: Orthopedics;  Laterality: Right;    There were no vitals filed for this visit.   Subjective Assessment - 11/21/20 1453     Subjective Pt reports that his hips were sore after last session.  But his back is feeling "ok" today. Transitional movements still painful, but not as bad.    Pertinent History AODM; HTN; cervical discetomy 1997, 2010; chronic LBP since 1978; arthris; B shd dislocation; sprains; RC sx  4/21    Currently in Pain? Yes    Pain Score 4     Pain Location Back    Pain Orientation Right;Left;Lower    Pain Descriptors / Indicators Aching    Aggravating Factors  transitional movements    Pain Relieving Factors heat, some exercises.             Pt seen for aquatic therapy today.  Treatment took place in water 3.25-4 ft in depth at the [facility] pool. Temp of water was 91.  Pt entered/exited the pool via stairs independently with bilat rail.  Treatment:   Gait (without floatation assistance):  forward, backward and sidestepping. 2 sets.   Tandem stance with hands skulling for added balance - 15 sec, then with horiz head turns. 30 sec total.   At bench:  sit to/from stand with side step and TA set (lifting feet off bottom of pool)   Holding onto edge of pool:    split squats x 10 each leg,  heel/toe raises x 12,  side to side lunges x 10 each direction. Calf stretch x 20 sec x 2 reps each leg, with cues for form.   Core work, submerging yellow kickboard under water x 5 sec x 10 reps   Pt requires buoyancy for support and to offload joints with strengthening exercises. Viscosity of the  water is needed for resistance of strengthening; water current perturbations provides challenge to standing balance unsupported, requiring increased core activation.       PT Long Term Goals - 11/03/20 1343       PT LONG TERM GOAL #1   Title Increase trunk and LE ROM/mobility allowing patient to move to change positions with greater ease and decreased pain    Time 6    Period Weeks    Status New    Target Date 12/15/20      PT LONG TERM GOAL #2   Title Decrease pain with functional activities allowing patient to sit or stand for 1 hour without increased pain    Time 6    Period Weeks    Status New    Target Date 12/15/20      PT LONG TERM GOAL #3   Title Patient to demonstrate and/or verbalize proper body mechanics for transitional movements and lifting    Time 6     Period Weeks    Status New    Target Date 12/15/20      PT LONG TERM GOAL #4   Title Independent in HEP including aquatic exercise as indicated    Time 6    Period Weeks    Status New    Target Date 12/15/20      PT LONG TERM GOAL #5   Title Improve functional limitations score to 49    Time 6    Status New    Target Date 12/15/20                   Plan - 11/21/20 1504     Clinical Impression Statement Pt tolerated aquatic exercises well, with report of slight improvement in LBP.  Minor cues on form for sit to/from stand. PRogressing towards goals.    Comorbidities AODM, HTN, obesity; bilat shoulder problems post Rt RCR 4/21; chronic LBP    Rehab Potential Good    PT Frequency 2x / week    PT Duration 6 weeks    PT Treatment/Interventions ADLs/Self Care Home Management;Aquatic Therapy;Cryotherapy;Electrical Stimulation;Iontophoresis 4mg /ml Dexamethasone;Moist Heat;Ultrasound;Functional mobility training;Therapeutic activities;Therapeutic exercise;Balance training;Neuromuscular re-education;Patient/family education;Manual techniques;Dry needling;Taping    PT Next Visit Plan advance core stability, strengthening, increase activity level, manual work and modalities as indicated.    PT Home Exercise Plan [email or web address]    Consulted and Agree with Plan of Care Patient             Patient will benefit from skilled therapeutic intervention in order to improve the following deficits and impairments:  Decreased range of motion, Increased fascial restricitons, Pain, Hypomobility, Improper body mechanics, Impaired flexibility, Decreased mobility, Decreased strength, Postural dysfunction, Obesity  Visit Diagnosis: Chronic bilateral low back pain without sciatica  Flank pain  Other symptoms and signs involving the musculoskeletal system     Problem List Patient Active Problem List   Diagnosis Date Noted   Diabetic neuropathy (HCC) 07/31/2020   Abdominal aortic aneurysm  (HCC) 07/31/2020   Insomnia 07/31/2020   GERD (gastroesophageal reflux disease) 01/09/2020   Partial nontraumatic tear of right rotator cuff 08/22/2019   Impingement syndrome of right shoulder 08/22/2019   Bilateral shoulder pain 08/02/2019   Interstitial pulmonary disease (HCC) 06/29/2018   Hypertension associated with diabetes (HCC) 07/13/2017   Benign essential tremor 07/13/2017   Kidney cyst, acquired 01/24/2017   Seborrheic keratoses 04/01/2016   Diverticulosis of colon without hemorrhage 02/26/2016   Anxiety state 10/02/2015   BPH (benign prostatic  hyperplasia) 07/31/2015   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   Vitamin D deficiency 03/06/2015   History of pancreatitis 03/06/2015   Lumbar spondylosis 03/04/2015   Type II diabetes mellitus with complication (HCC) 05/15/2014   Obstructive sleep apnea 05/15/2014   Hyperlipidemia 05/15/2014   Glaucoma suspect 10/21/2011    Mayer Camel, PTA 11/21/20 3:27 PM   [facility] [place] [address] [place], [place], [address] Phone: [phone]   Fax:  [phone]  Name: VENNIE WAYMIRE [name] MRN: [id number] Date of Birth: [birth year]

## 2020-11-26 ENCOUNTER — Ambulatory Visit (INDEPENDENT_AMBULATORY_CARE_PROVIDER_SITE_OTHER): Payer: Medicare Other | Admitting: Rehabilitative and Restorative Service Providers"

## 2020-11-26 ENCOUNTER — Encounter: Payer: Self-pay | Admitting: Rehabilitative and Restorative Service Providers"

## 2020-11-26 ENCOUNTER — Other Ambulatory Visit: Payer: Self-pay

## 2020-11-26 DIAGNOSIS — M545 Low back pain, unspecified: Secondary | ICD-10-CM

## 2020-11-26 DIAGNOSIS — G8929 Other chronic pain: Secondary | ICD-10-CM

## 2020-11-26 DIAGNOSIS — R109 Unspecified abdominal pain: Secondary | ICD-10-CM

## 2020-11-26 DIAGNOSIS — R29898 Other symptoms and signs involving the musculoskeletal system: Secondary | ICD-10-CM

## 2020-11-26 NOTE — Patient Instructions (Signed)
Access Code: [email or web address]: [email or web address]: 06/29/2022Prepared by: Oluwatobi Ruppe HoltExercises  Prone Quadriceps Stretch with Strap - 2 x daily - 7 x weekly - 1 sets - 3 reps - 30 sec hold  Hooklying Hamstring Stretch with Strap - 2 x daily - 7 x weekly - 1 sets - 3 reps - 30 sec hold  Supine ITB Stretch with Strap - 2 x daily - 7 x weekly - 1 sets - 3 reps - 30 sec hold  Standing Lumbar Extension - 2 x daily - 7 x weekly - 1 sets - 2-3 reps - 2-3 sec hold  Seated Sidebending - 2 x daily - 7 x weekly - 1 sets - 3 reps - 15-20 sec hold  Sit to Stand - 2 x daily - 7 x weekly - 1 sets - 10 reps - 3-5 sec hold  Sidelying Thoracic Rotation with Open Book - 1 x daily - 7 x weekly - 1 sets - 5 reps - 5-10 seconds hold  Seated Hamstring Stretch with Chair - 2 x daily - 7 x weekly - 1 sets - 3 reps - 20-30 seconds hold  Seated Anterior Tibialis Stretch - 2 x daily - 7 x weekly - 1 sets - 3 reps - 30 sec hold  Gastroc Stretch on Wall - 2 x daily - 7 x weekly - 1 sets - 3 reps - 30 sec hold  Seated Sidebending - 2 x daily - 7 x weekly - 1 sets - 3 reps - 30 sec hold  Wall Quarter Squat - 2 x daily - 7 x weekly - 1-2 sets - 10 reps - 5-10 sec hold  Anti-Rotation Sidestepping with Resistance - 1 x daily - 7 x weekly - 1 sets - 10 reps - 5 sec hold

## 2020-11-26 NOTE — Therapy (Signed)
[facility] [place] [address] [place], [place], [address] Phone: [phone]   Fax:  [phone]  Physical Therapy Treatment  Patient Details  Name: Gregory KEENUM [name] MRN: [id number] Date of Birth: [birth year] Referring Provider (PT): Dr Benjamin Stain   Encounter Date: 11/26/2020   PT End of Session - 11/26/20 0800     Visit Number 8    Number of Visits 12    Date for PT Re-Evaluation 12/15/20    PT Start Time 0759    PT Stop Time 0842    PT Time Calculation (min) 43 min    Activity Tolerance Patient tolerated treatment well             Past Medical History:  Diagnosis Date   Arthritis    Chest pain    a. Normal cors 2001. b. Neg stress test 2012, 05/2014.   Diabetes mellitus without complication (HCC)    GERD (gastroesophageal reflux disease)    Hyperlipidemia    Hypertension    Hypertensive response to exercise    Hypothyroidism    Obesity    Obstructive sleep apnea    Currently untreated    PONV (postoperative nausea and vomiting)     Past Surgical History:  Procedure Laterality Date   CARDIAC CATHETERIZATION     CERVICAL FUSION     HERNIA REPAIR     KNEE ARTHROSCOPY  1984   both knee   SHOULDER ARTHROSCOPY WITH SUBACROMIAL DECOMPRESSION Right 09/13/2019   Procedure: RIGHT SHOULDER ARTHROSCOPY WITH EXTENSIVE DEBRIDEMENT, SUBACROMIAL DECOMPRESSION,;  Surgeon: Kathryne Hitch, MD;  Location: [facility];  Service: Orthopedics;  Laterality: Right;    There were no vitals filed for this visit.   Subjective Assessment - 11/26/20 0801     Subjective Patient reports that he is feeling some better. LBP depends on what he is doing. Working on exercises some at home. Up and down steps several times yesterday. Liked the pool therapy. Felt good after the water.    Currently in Pain? Yes    Pain Score 4    3.5   Pain Location Back    Pain Orientation Right;Left;Lower    Pain  Descriptors / Indicators Aching    Pain Type Acute pain;Chronic pain                [facility] PT Assessment - 11/26/20 0001       Assessment   Medical Diagnosis Rt flank pain; mid to low back pain    Referring Provider (PT) Dr Benjamin Stain    Onset Date/Surgical Date 07/29/20    Hand Dominance Right    Next MD Visit 12/09/20    Prior Therapy here for LBP; no rehab for shoulder sx      AROM   Lumbar Flexion 80% painful LB    Lumbar Extension 45% pain in the LB    Lumbar - Right Side Bend 55% pain Rt flank    Lumbar - Left Side Bend 60% mild pain Rt flank    Lumbar - Right Rotation 50%    Lumbar - Left Rotation 60%      Palpation   Palpation comment muscular tightness Rt psoas; Rt lumbar paraspinals, QL, lats, piriformis, gluts - less tightness Lt lumbar and posterior hip                           [facility] Adult PT Treatment/Exercise - 11/26/20 0001  Lumbar Exercises: Stretches   Active Hamstring Stretch Right;Left;1 rep;2 reps;30 seconds    Active Hamstring Stretch Limitations sitting; hing from hips    Standing Side Bend Limitations seated side bend with arm overhead x 3 reps of 10 sec each side.    Gastroc Stretch Right;Left;2 reps;30 seconds    Gastroc Stretch Limitations slant board      Lumbar Exercises: Aerobic   Nustep L7: 6 min, UE 11      Lumbar Exercises: Standing   Wall Slides 10 reps   quarter squat - 10 sec hold   Scapular Retraction Strengthening;Both;Theraband    Theraband Level (Scapular Retraction) Level 4 (Blue)    Scapular Retraction Limitations isometric hold with step back 5 sec hold x 10 reps    Row Strengthening;Both;10 reps;Theraband    Theraband Level (Row) Level 4 (Blue)    Row Limitations bow and arrow x 10 each side blue TB    Shoulder Extension Strengthening;Both;10 reps;Theraband    Theraband Level (Shoulder Extension) Level 4 (Blue)    Other Standing Lumbar Exercises isometric hold blue TB stepping to side x 10 each  side      Lumbar Exercises: Seated   Sit to Stand 5 reps                    PT Education - 11/26/20 0841     Education Details HEP    Person(s) Educated Patient    Methods Explanation;Demonstration;Tactile cues;Verbal cues;Handout    Comprehension Verbalized understanding;Returned demonstration;Verbal cues required;Tactile cues required                 PT Long Term Goals - 11/03/20 1343       PT LONG TERM GOAL #1   Title Increase trunk and LE ROM/mobility allowing patient to move to change positions with greater ease and decreased pain    Time 6    Period Weeks    Status New    Target Date 12/15/20      PT LONG TERM GOAL #2   Title Decrease pain with functional activities allowing patient to sit or stand for 1 hour without increased pain    Time 6    Period Weeks    Status New    Target Date 12/15/20      PT LONG TERM GOAL #3   Title Patient to demonstrate and/or verbalize proper body mechanics for transitional movements and lifting    Time 6    Period Weeks    Status New    Target Date 12/15/20      PT LONG TERM GOAL #4   Title Independent in HEP including aquatic exercise as indicated    Time 6    Period Weeks    Status New    Target Date 12/15/20      PT LONG TERM GOAL #5   Title Improve functional limitations score to 49    Time 6    Status New    Target Date 12/15/20                   Plan - 11/26/20 0807     Clinical Impression Statement Patient reports continued back pain with variable intensity dependent upon activity. Pain is worse with prolonged sitting. Patient has persistent limitations in trunk ROM and muscular tightness but increasing ROM. Progressing with core stabilization and strengthening. Working gradually toward goals of therapy.    Rehab Potential Good    PT Frequency 2x /  week    PT Duration 6 weeks    PT Treatment/Interventions ADLs/Self Care Home Management;Aquatic Therapy;Cryotherapy;Electrical  Stimulation;Iontophoresis 4mg /ml Dexamethasone;Moist Heat;Ultrasound;Functional mobility training;Therapeutic activities;Therapeutic exercise;Balance training;Neuromuscular re-education;Patient/family education;Manual techniques;Dry needling;Taping    PT Next Visit Plan advance core stability, strengthening, increase activity level, manual work and modalities as indicated.    PT Home Exercise Plan [id number]    Consulted and Agree with Plan of Care Patient             Patient will benefit from skilled therapeutic intervention in order to improve the following deficits and impairments:     Visit Diagnosis: Chronic bilateral low back pain without sciatica  Flank pain  Other symptoms and signs involving the musculoskeletal system     Problem List Patient Active Problem List   Diagnosis Date Noted   Diabetic neuropathy (HCC) 07/31/2020   Abdominal aortic aneurysm (HCC) 07/31/2020   Insomnia 07/31/2020   GERD (gastroesophageal reflux disease) 01/09/2020   Partial nontraumatic tear of right rotator cuff 08/22/2019   Impingement syndrome of right shoulder 08/22/2019   Bilateral shoulder pain 08/02/2019   Interstitial pulmonary disease (HCC) 06/29/2018   Hypertension associated with diabetes (HCC) 07/13/2017   Benign essential tremor 07/13/2017   Kidney cyst, acquired 01/24/2017   Seborrheic keratoses 04/01/2016   Diverticulosis of colon without hemorrhage 02/26/2016   Anxiety state 10/02/2015   BPH (benign prostatic hyperplasia) 07/31/2015   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   Vitamin D deficiency 03/06/2015   History of pancreatitis 03/06/2015   Lumbar spondylosis 03/04/2015   Type II diabetes mellitus with complication (HCC) 05/15/2014   Obstructive sleep apnea 05/15/2014   Hyperlipidemia 05/15/2014   Glaucoma suspect 10/21/2011    Howard Bunte 10/23/2011 PT, MPH  11/26/2020, 8:46 AM  [facility] [address] [place], [place], [place] Phone: [phone]   Fax:  [phone]  Name: Gregory NETTER [name] MRN: Edwyna Ready Date of Birth: 21-Jul-1950

## 2020-11-28 ENCOUNTER — Ambulatory Visit (INDEPENDENT_AMBULATORY_CARE_PROVIDER_SITE_OTHER): Payer: Medicare Other | Admitting: Physical Therapy

## 2020-11-28 ENCOUNTER — Other Ambulatory Visit: Payer: Self-pay

## 2020-11-28 ENCOUNTER — Encounter: Payer: Self-pay | Admitting: Physical Therapy

## 2020-11-28 DIAGNOSIS — R10A0 Flank pain, unspecified side: Secondary | ICD-10-CM

## 2020-11-28 DIAGNOSIS — G8929 Other chronic pain: Secondary | ICD-10-CM | POA: Diagnosis not present

## 2020-11-28 DIAGNOSIS — R109 Unspecified abdominal pain: Secondary | ICD-10-CM

## 2020-11-28 DIAGNOSIS — R29898 Other symptoms and signs involving the musculoskeletal system: Secondary | ICD-10-CM

## 2020-11-28 DIAGNOSIS — M545 Low back pain, unspecified: Secondary | ICD-10-CM

## 2020-11-28 NOTE — Therapy (Signed)
[facility] [place] [address] [place], [place], [address] Phone: [phone]   Fax:  [phone]  Physical Therapy Treatment  Patient Details  Name: Gregory LAVE [name] MRN: [id number] Date of Birth: Dec 30, 1950 Referring Provider (PT): Dr Benjamin Stain   Encounter Date: 11/28/2020   PT End of Session - 11/28/20 1410     Visit Number 9    Number of Visits 12    Date for PT Re-Evaluation 12/15/20    PT Start Time 1400    PT Stop Time 1440    PT Time Calculation (min) 40 min    Activity Tolerance Patient tolerated treatment well    Behavior During Therapy [facility] for tasks assessed/performed             Past Medical History:  Diagnosis Date   Arthritis    Chest pain    a. Normal cors 2001. b. Neg stress test 2012, 05/2014.   Diabetes mellitus without complication (HCC)    GERD (gastroesophageal reflux disease)    Hyperlipidemia    Hypertension    Hypertensive response to exercise    Hypothyroidism    Obesity    Obstructive sleep apnea    Currently untreated    PONV (postoperative nausea and vomiting)     Past Surgical History:  Procedure Laterality Date   CARDIAC CATHETERIZATION     CERVICAL FUSION     HERNIA REPAIR     KNEE ARTHROSCOPY  1984   both knee   SHOULDER ARTHROSCOPY WITH SUBACROMIAL DECOMPRESSION Right 09/13/2019   Procedure: RIGHT SHOULDER ARTHROSCOPY WITH EXTENSIVE DEBRIDEMENT, SUBACROMIAL DECOMPRESSION,;  Surgeon: Kathryne Hitch, MD;  Location: [facility];  Service: Orthopedics;  Laterality: Right;    There were no vitals filed for this visit.   Subjective Assessment - 11/28/20 1411     Subjective Pt reports his hips were sore after last pool session.  He had some increased low back pain after last land session; reduced with pain medication. "I'm doing pretty good." He states that he is having less back pain with transitional movements.    Pertinent History AODM; HTN;  cervical discetomy 1997, 2010; chronic LBP since 1978; arthris; B shd dislocation; sprains; RC sx 4/21    Currently in Pain? Yes    Pain Score 4     Pain Location Back    Pain Orientation Lower    Pain Descriptors / Indicators Aching    Pain Radiating Towards radiating to knees.    Aggravating Factors  transitional movements    Pain Relieving Factors heat, some exercises.            Pt seen for aquatic therapy today.  Treatment took place in water 3.25-4 ft in depth at the [facility] pool. Temp of water was 91.  Pt entered/exited the pool via stairs independently with single rail.  Treatment:   Gait (without floatation assistance):  forward, backward and sidestepping. 2 sets.  High knee marching with TA engaged and reciprocal arm swing - forward and backward.    At bench:  sit to/from stand with side step and TA set (lifting feet off bottom of pool)   Holding onto edge of pool:     heel raises x 20, calf stretch x 20 sec x 2 reps each leg,   side to side lunges x 10 each direction.  Hip flexion to/from ext x 12 each leg.   Core work, submerging yellow kickboard under water x 5 sec x  5 reps, switched to big square noodle for extra challenge x 5 sec x 8 reps, then squat with forward row x 15 reps, and trunk rotation with arms still on noodle with TA engaged x 10.    SLS R/L without UE support x 3 reps each leg. Tandem stance each leg x 2 reps each leg forward.   Hamstring stretch with foot on 2nd step of pool and hands on railings, hip hinge x 20 sec.   Pt requires buoyancy for support and to offload joints with strengthening exercises. Viscosity of the water is needed for resistance of strengthening; water current perturbations provides challenge to standing balance unsupported, requiring increased core activation.       PT Long Term Goals - 11/03/20 1343       PT LONG TERM GOAL #1   Title Increase trunk and LE ROM/mobility allowing patient to move to change  positions with greater ease and decreased pain    Time 6    Period Weeks    Status New    Target Date 12/15/20      PT LONG TERM GOAL #2   Title Decrease pain with functional activities allowing patient to sit or stand for 1 hour without increased pain    Time 6    Period Weeks    Status New    Target Date 12/15/20      PT LONG TERM GOAL #3   Title Patient to demonstrate and/or verbalize proper body mechanics for transitional movements and lifting    Time 6    Period Weeks    Status New    Target Date 12/15/20      PT LONG TERM GOAL #4   Title Independent in HEP including aquatic exercise as indicated    Time 6    Period Weeks    Status New    Target Date 12/15/20      PT LONG TERM GOAL #5   Title Improve functional limitations score to 49    Time 6    Status New    Target Date 12/15/20                   Plan - 11/28/20 1432     Clinical Impression Statement Pt reported a good stretch to lower back with front/back leg swings.  He tolerated increased number of exercises in water today, at a good pace.  Pt reported reduction of back pain to 2/10 during session.    Rehab Potential Good    PT Frequency 2x / week    PT Duration 6 weeks    PT Treatment/Interventions ADLs/Self Care Home Management;Aquatic Therapy;Cryotherapy;Electrical Stimulation;Iontophoresis 4mg /ml Dexamethasone;Moist Heat;Ultrasound;Functional mobility training;Therapeutic activities;Therapeutic exercise;Balance training;Neuromuscular re-education;Patient/family education;Manual techniques;Dry needling;Taping    PT Next Visit Plan Assess goals and 10th visit note.    PT Home Exercise Plan [id number]    Consulted and Agree with Plan of Care Patient             Patient will benefit from skilled therapeutic intervention in order to improve the following deficits and impairments:  Decreased range of motion, Increased fascial restricitons, Pain, Hypomobility, Improper body mechanics, Impaired  flexibility, Decreased mobility, Decreased strength, Postural dysfunction, Obesity  Visit Diagnosis: Chronic bilateral low back pain without sciatica  Flank pain  Other symptoms and signs involving the musculoskeletal system     Problem List Patient Active Problem List   Diagnosis Date Noted   Diabetic neuropathy (HCC) 07/31/2020   Abdominal aortic aneurysm (  HCC) 07/31/2020   Insomnia 07/31/2020   GERD (gastroesophageal reflux disease) 01/09/2020   Partial nontraumatic tear of right rotator cuff 08/22/2019   Impingement syndrome of right shoulder 08/22/2019   Bilateral shoulder pain 08/02/2019   Interstitial pulmonary disease (HCC) 06/29/2018   Hypertension associated with diabetes (HCC) 07/13/2017   Benign essential tremor 07/13/2017   Kidney cyst, acquired 01/24/2017   Seborrheic keratoses 04/01/2016   Diverticulosis of colon without hemorrhage 02/26/2016   Anxiety state 10/02/2015   BPH (benign prostatic hyperplasia) 07/31/2015   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   Vitamin D deficiency 03/06/2015   History of pancreatitis 03/06/2015   Lumbar spondylosis 03/04/2015   Type II diabetes mellitus with complication (HCC) 05/15/2014   Obstructive sleep apnea 05/15/2014   Hyperlipidemia 05/15/2014   Glaucoma suspect 10/21/2011   Mayer Camel, PTA 11/28/20 2:40 PM   [facility] [place] [address] [place], [place], [address] Phone: [phone]   Fax:  [phone]  Name: DEKARI BURES [name] MRN: [id number] Date of Birth: 15-Aug-1950

## 2020-12-03 ENCOUNTER — Encounter: Payer: Self-pay | Admitting: Rehabilitative and Restorative Service Providers"

## 2020-12-03 ENCOUNTER — Other Ambulatory Visit: Payer: Self-pay

## 2020-12-03 ENCOUNTER — Ambulatory Visit (INDEPENDENT_AMBULATORY_CARE_PROVIDER_SITE_OTHER): Payer: Medicare Other | Admitting: Rehabilitative and Restorative Service Providers"

## 2020-12-03 DIAGNOSIS — R29898 Other symptoms and signs involving the musculoskeletal system: Secondary | ICD-10-CM | POA: Diagnosis not present

## 2020-12-03 DIAGNOSIS — G8929 Other chronic pain: Secondary | ICD-10-CM

## 2020-12-03 DIAGNOSIS — R109 Unspecified abdominal pain: Secondary | ICD-10-CM

## 2020-12-03 DIAGNOSIS — M545 Low back pain, unspecified: Secondary | ICD-10-CM | POA: Diagnosis not present

## 2020-12-03 NOTE — Therapy (Addendum)
[facility] [place] [address] [place], [place], [address] Phone: [phone]   Fax:  [phone]  Physical Therapy Treatment  Patient Details  Name: ORVA GWALTNEY [name] MRN: [id number] Date of Birth: 1951/03/27 Referring Provider (PT): Dr Benjamin Stain   Encounter Date: 12/03/2020   PT End of Session - 12/03/20 0814     Visit Number 10    Number of Visits 12    Date for PT Re-Evaluation 12/15/20    PT Start Time 0804    PT Stop Time 0845    PT Time Calculation (min) 41 min    Activity Tolerance Patient tolerated treatment well             Past Medical History:  Diagnosis Date   Arthritis    Chest pain    a. Normal cors 2001. b. Neg stress test 2012, 05/2014.   Diabetes mellitus without complication (HCC)    GERD (gastroesophageal reflux disease)    Hyperlipidemia    Hypertension    Hypertensive response to exercise    Hypothyroidism    Obesity    Obstructive sleep apnea    Currently untreated    PONV (postoperative nausea and vomiting)     Past Surgical History:  Procedure Laterality Date   CARDIAC CATHETERIZATION     CERVICAL FUSION     HERNIA REPAIR     KNEE ARTHROSCOPY  1984   both knee   SHOULDER ARTHROSCOPY WITH SUBACROMIAL DECOMPRESSION Right 09/13/2019   Procedure: RIGHT SHOULDER ARTHROSCOPY WITH EXTENSIVE DEBRIDEMENT, SUBACROMIAL DECOMPRESSION,;  Surgeon: Kathryne Hitch, MD;  Location: [facility];  Service: Orthopedics;  Laterality: Right;    There were no vitals filed for this visit.   Subjective Assessment - 12/03/20 0815     Subjective Patient reports that the flank pain has resolved - now back to the chronic LBP. Does like the pool exercises. "It's easier" Feels confident in continuing with HEP independently.    Currently in Pain? Yes    Pain Score 6     Pain Location Back    Pain Orientation Lower    Pain Descriptors / Indicators Aching    Pain Type Chronic  pain                               [facility] Adult PT Treatment/Exercise - 12/03/20 0001       Lumbar Exercises: Stretches   Active Hamstring Stretch Right;Left;1 rep;2 reps;30 seconds    Active Hamstring Stretch Limitations sitting; hinge from hips    Gastroc Stretch Right;Left;2 reps;30 seconds    Gastroc Stretch Limitations slant board      Lumbar Exercises: Aerobic   UBE (Upper Arm Bike) L5 x 4 min - 2 min fwd/2 back    Nustep L5: 5 min, UE 10      Lumbar Exercises: Standing   Shoulder Adduction Limitations antirotation green TB x 10    Other Standing Lumbar Exercises walking with 10# KB Rt/Lt x 2 laps each    Other Standing Lumbar Exercises isometric hold blue TB stepping back x 10      Lumbar Exercises: Seated   Sit to Stand 20 reps   last 10 with 10# KB   Other Seated Lumbar Exercises triceps press x 10                    PT Education - 12/03/20 4824  Education Details community resources in [place]) Educated Patient    Methods Explanation;Handout                 PT Long Term Goals - 12/03/20 0820       PT LONG TERM GOAL #1   Title Increase trunk and LE ROM/mobility allowing patient to move to change positions with greater ease and decreased pain    Time 6    Period Weeks    Status Achieved      PT LONG TERM GOAL #2   Title Decrease pain with functional activities allowing patient to sit or stand for 1 hour without increased pain    Time 6    Period Weeks    Status Achieved      PT LONG TERM GOAL #3   Title Patient to demonstrate and/or verbalize proper body mechanics for transitional movements and lifting    Time 6    Period Weeks    Status Achieved      PT LONG TERM GOAL #4   Title Independent in HEP including aquatic exercise as indicated    Time 6    Period Weeks    Status Achieved      PT LONG TERM GOAL #5   Title Improve functional limitations score to 49    Time 6    Period Weeks     Status On-going                   Plan - 12/03/20 0817     Clinical Impression Statement Patient's flank pain that he came back in to therapy for has resolved. He has the LBP which he has had for years. He feels independent in HEP and ready to continue with independent HEP following one more aquatic therapy. Goals of therapy have been accomplished except for functional outcome score which has improved significantly but has not reached goal.    Rehab Potential Good    PT Frequency 2x / week    PT Duration 6 weeks    PT Treatment/Interventions ADLs/Self Care Home Management;Aquatic Therapy;Cryotherapy;Electrical Stimulation;Iontophoresis 4mg /ml Dexamethasone;Moist Heat;Ultrasound;Functional mobility training;Therapeutic activities;Therapeutic exercise;Balance training;Neuromuscular re-education;Patient/family education;Manual techniques;Dry needling;Taping    PT Next Visit Plan D/C to independent HEP following one additional aquatic therapy visit.    PT Home Exercise Plan [id number]    Consulted and Agree with Plan of Care Patient             Patient will benefit from skilled therapeutic intervention in order to improve the following deficits and impairments:     Visit Diagnosis: Chronic bilateral low back pain without sciatica  Flank pain  Other symptoms and signs involving the musculoskeletal system     Problem List Patient Active Problem List   Diagnosis Date Noted   Diabetic neuropathy ([place]) 07/31/2020   Abdominal aortic aneurysm ([place]) 07/31/2020   Insomnia 07/31/2020   GERD (gastroesophageal reflux disease) 01/09/2020   Partial nontraumatic tear of right rotator cuff 08/22/2019   Impingement syndrome of right shoulder 08/22/2019   Bilateral shoulder pain 08/02/2019   Interstitial pulmonary disease ([place]) 06/29/2018   Hypertension associated with diabetes ([place]) 07/13/2017   Benign essential tremor 07/13/2017   Kidney cyst, acquired 01/24/2017   Seborrheic  keratoses 04/01/2016   Diverticulosis of colon without hemorrhage 02/26/2016   Anxiety state 10/02/2015   BPH (benign prostatic hyperplasia) 07/31/2015   Hypogonadotropic hypogonadism ([place]) 03/06/2015   Vitamin D deficiency 03/06/2015   History of pancreatitis 03/06/2015  Lumbar spondylosis 03/04/2015   Type II diabetes mellitus with complication ([place]) 23/46/8873   Obstructive sleep apnea 05/15/2014   Hyperlipidemia 05/15/2014   Glaucoma suspect 10/21/2011    Partick Musselman Nilda Simmer PT, MPH  12/03/2020, 8:43 AM  [facility] [place], [place], [address] Phone: [phone]   Fax:  [phone]  Name: JIBRAN CROOKSHANKS [name] MRN: [id number] Date of Birth: 1951-05-08   PHYSICAL THERAPY DISCHARGE SUMMARY  Visits from Start of Care: 10  Current functional level related to goals / functional outcomes: See last progress note for discharge status    Remaining deficits: Continued intermittent pain in LB which is chronic in nature and present for years    Education / Equipment: HEP    Patient agrees to discharge. Patient goals were met. Patient is being discharged due to meeting the stated rehab goals.  Diondra Pines P. Helene Kelp PT, MPH 12/11/20 11:29 AM

## 2020-12-03 NOTE — Patient Instructions (Signed)
[place] [facility] Exercise Resources   Community Programs Location and contact info description of services cost  [employer] [address] [place], [place] [phone]  Aquatics, silver sneakers classes, yoga, exercise equipment $18/month for 55+ $25/month individual $40-$60/month family  [facility] [address] [place], [place] [phone] Transportation to appointments. Activities (stretching, dancing, Tai Chi)  Mostly free  [place] [facility] [address] [place], [place] [phone]  [employer] [facility] [facility] Aquatics Fee based  [employer] exercise program Various locations [phone]  [place] exercise free  [place] [facility] Various locations [phone] Yoga, Tai Chi, Step Aerobics, Bocce, Table tennis, chair volleyball, [place]  free  [facility] [address] [place], [place]  [phone] Various activities ([place]rum exercise, yoga, line dancing, etc)  free    

## 2020-12-05 ENCOUNTER — Ambulatory Visit: Payer: Medicare Other | Admitting: Physical Therapy

## 2020-12-08 ENCOUNTER — Ambulatory Visit: Payer: Medicare Other | Admitting: Sports Medicine

## 2020-12-09 ENCOUNTER — Ambulatory Visit: Payer: Medicare Other | Admitting: Sports Medicine

## 2020-12-22 ENCOUNTER — Other Ambulatory Visit: Payer: Self-pay

## 2020-12-22 ENCOUNTER — Ambulatory Visit (INDEPENDENT_AMBULATORY_CARE_PROVIDER_SITE_OTHER): Payer: Medicare Other | Admitting: Sports Medicine

## 2020-12-22 DIAGNOSIS — M47816 Spondylosis without myelopathy or radiculopathy, lumbar region: Secondary | ICD-10-CM | POA: Diagnosis not present

## 2020-12-22 NOTE — Assessment & Plan Note (Signed)
This is a pleasant 70 year old male, long history of low back pain, midline, axial, worse with sitting, flexion, Valsalva. Urinalysis was negative. We treated him conservatively, formal PT, prednisone, meloxicam, he returns today doing really well. Continue conservative treatment, return to see me as needed.

## 2020-12-22 NOTE — Progress Notes (Signed)
    Procedures performed today:    None.  Independent interpretation of notes and tests performed by another provider:   None.  Brief History, Exam, Impression, and Recommendations:    Lumbar spondylosis This is a pleasant 70 year old male, long history of low back pain, midline, axial, worse with sitting, flexion, Valsalva. Urinalysis was negative. We treated him conservatively, formal PT, prednisone, meloxicam, he returns today doing really well. Continue conservative treatment, return to see me as needed.    ___________________________________________ Gregory West. Benjamin Stain, M.D., ABFM., CAQSM. Primary Care and Sports Medicine [facility]  Adjunct Instructor of Family Medicine  [employer]

## 2021-01-23 DIAGNOSIS — B351 Tinea unguium: Secondary | ICD-10-CM | POA: Diagnosis not present

## 2021-01-23 DIAGNOSIS — L84 Corns and callosities: Secondary | ICD-10-CM | POA: Diagnosis not present

## 2021-01-23 DIAGNOSIS — E1142 Type 2 diabetes mellitus with diabetic polyneuropathy: Secondary | ICD-10-CM | POA: Diagnosis not present

## 2021-01-29 ENCOUNTER — Ambulatory Visit (INDEPENDENT_AMBULATORY_CARE_PROVIDER_SITE_OTHER): Payer: Medicare Other

## 2021-01-29 ENCOUNTER — Encounter: Payer: Self-pay | Admitting: Family Medicine

## 2021-01-29 ENCOUNTER — Ambulatory Visit (INDEPENDENT_AMBULATORY_CARE_PROVIDER_SITE_OTHER): Payer: Medicare Other | Admitting: Family Medicine

## 2021-01-29 ENCOUNTER — Other Ambulatory Visit: Payer: Self-pay

## 2021-01-29 VITALS — BP 115/71 | HR 61 | Ht 70.0 in | Wt 275.0 lb

## 2021-01-29 DIAGNOSIS — M5416 Radiculopathy, lumbar region: Secondary | ICD-10-CM | POA: Diagnosis not present

## 2021-01-29 DIAGNOSIS — E118 Type 2 diabetes mellitus with unspecified complications: Secondary | ICD-10-CM | POA: Diagnosis not present

## 2021-01-29 DIAGNOSIS — E1159 Type 2 diabetes mellitus with other circulatory complications: Secondary | ICD-10-CM

## 2021-01-29 DIAGNOSIS — M549 Dorsalgia, unspecified: Secondary | ICD-10-CM

## 2021-01-29 DIAGNOSIS — G250 Essential tremor: Secondary | ICD-10-CM

## 2021-01-29 DIAGNOSIS — N401 Enlarged prostate with lower urinary tract symptoms: Secondary | ICD-10-CM

## 2021-01-29 DIAGNOSIS — E782 Mixed hyperlipidemia: Secondary | ICD-10-CM | POA: Diagnosis not present

## 2021-01-29 DIAGNOSIS — M47816 Spondylosis without myelopathy or radiculopathy, lumbar region: Secondary | ICD-10-CM | POA: Diagnosis not present

## 2021-01-29 DIAGNOSIS — M545 Low back pain, unspecified: Secondary | ICD-10-CM | POA: Diagnosis not present

## 2021-01-29 DIAGNOSIS — I152 Hypertension secondary to endocrine disorders: Secondary | ICD-10-CM

## 2021-01-29 DIAGNOSIS — R350 Frequency of micturition: Secondary | ICD-10-CM

## 2021-01-29 DIAGNOSIS — Z23 Encounter for immunization: Secondary | ICD-10-CM | POA: Diagnosis not present

## 2021-01-29 MED ORDER — PREDNISONE 50 MG PO TABS: ORAL_TABLET | ORAL | 0 refills | Status: DC

## 2021-01-29 NOTE — Assessment & Plan Note (Signed)
Managed by endocrinology.  He is overdue for updated A1c.  Check today with lab work.  Encouraged continued compliance with medication and diet.

## 2021-01-29 NOTE — Assessment & Plan Note (Signed)
Update PSA.  Symptoms well managed with tamsulosin.Gregory West

## 2021-01-29 NOTE — Assessment & Plan Note (Signed)
Blood pressure remains well controlled at this time.  Recommend continuation at current strength.

## 2021-01-29 NOTE — Patient Instructions (Addendum)
Great to see you today! Have xray completed downstairs.  Start prednisone 50mg  daily for 5 days.   We'll be in touch with lab results.

## 2021-01-29 NOTE — Progress Notes (Signed)
Gregory Ready [name] - 70 y.o. male MRN [id number]  Date of birth: 13-Feb-1951  Subjective Chief Complaint  Patient presents with   Hip Pain    HPI Gregory West is a 70 year old male here today for follow-up visit.  He also has complaint of left hip pain.  He does continue to care through the [place] where he receives a few of his medications through the [place] system.  Diabetes is managed by endocrinology.  He reports that his previous endocrinologist has retired and he does not have another appointment for several months.  He would like to have updated labs today.  He does have all his prescriptions.  Insomnia remains well controlled with trazodone at current strength.  No significant side effects noted with this.  BPH symptoms are stable with tamsulosin and 0.4 mg daily.  No increased nocturia or trouble with starting flow.  Blood pressure remains well controlled with combination of losartan and propranolol.  No side effects noted with medication.  He denies chest pain, shortness of breath, palpitations, headaches or vision changes.  He complains of pain in his left posterior hip area.  He does not recall any injury.  He does have radiation into the buttock and back of the leg.  He does have mild tingling sensation.  There is no leg weakness.  ROS:  A comprehensive ROS was completed and negative except as noted per HPI  Allergies  Allergen Reactions   Statins     Muscle cramping all over body. Lipitor, Crestor, and pravastatin.   Lisinopril Cough   Victoza [Liraglutide] Other (See Comments)    Caused pancreatitis    Past Medical History:  Diagnosis Date   Arthritis    Chest pain    a. Normal cors 2001. b. Neg stress test 2012, 05/2014.   Diabetes mellitus without complication (HCC)    GERD (gastroesophageal reflux disease)    Hyperlipidemia    Hypertension    Hypertensive response to exercise    Hypothyroidism    Obesity    Obstructive sleep apnea    Currently untreated    PONV  (postoperative nausea and vomiting)     Past Surgical History:  Procedure Laterality Date   CARDIAC CATHETERIZATION     CERVICAL FUSION     HERNIA REPAIR     KNEE ARTHROSCOPY  1984   both knee   SHOULDER ARTHROSCOPY WITH SUBACROMIAL DECOMPRESSION Right 09/13/2019   Procedure: RIGHT SHOULDER ARTHROSCOPY WITH EXTENSIVE DEBRIDEMENT, SUBACROMIAL DECOMPRESSION,;  Surgeon: Kathryne Hitch, MD;  Location: [facility];  Service: Orthopedics;  Laterality: Right;    Social History   Socioeconomic History   Marital status: Married    Spouse name: Gregory West   Number of children: 2   Years of education: 16   Highest education level: Bachelor's degree (e.g., BA, AB, BS)  Occupational History    Comment: Retired    Comment: working part time  Tobacco Use   Smoking status: Former    Packs/day: 1.50    Years: 30.00    Pack years: 45.00    Types: Cigarettes    Quit date: 1995    Years since quitting: 27.6   Smokeless tobacco: Never  Vaping Use   Vaping Use: Never used  Substance and Sexual Activity   Alcohol use: No   Drug use: No   Sexual activity: Not on file  Other Topics Concern   Not on file  Social History Narrative   Lives with his wife. He is working  working part-time (sometimes). He likes to read his bible and go fishing in his free time.    Social Determinants of Health   Financial Resource Strain: Low Risk    Difficulty of Paying Living Expenses: Not hard at all  Food Insecurity: No Food Insecurity   Worried About Programme researcher, broadcasting/film/video in the Last Year: Never true   Ran Out of Food in the Last Year: Never true  Transportation Needs: No Transportation Needs   Lack of Transportation (Medical): No   Lack of Transportation (Non-Medical): No  Physical Activity: Sufficiently Active   Days of Exercise per Week: 3 days   Minutes of Exercise per Session: 50 min  Stress: No Stress Concern Present   Feeling of Stress : Not at all  Social Connections:  Socially Integrated   Frequency of Communication with Friends and Family: More than three times a week   Frequency of Social Gatherings with Friends and Family: Three times a week   Attends Religious Services: More than 4 times per year   Active Member of Clubs or Organizations: Yes   Attends Engineer, structural: More than 4 times per year   Marital Status: Married    Family History  Problem Relation Age of Onset   Diabetes Mother    Heart disease Mother    Hypertension Mother    Stroke Mother    Heart attack Mother    Cancer Father    Heart disease Father    Hyperlipidemia Sister    Hypertension Sister    Diabetes Maternal Grandmother    Stroke Maternal Grandmother    Heart attack Maternal Grandmother    Diabetes Maternal Grandfather    Hypertension Maternal Grandfather    Heart attack Maternal Grandfather    Diabetes Sister    Hypertension Sister    Heart attack Paternal Grandmother    Heart attack Paternal Grandfather     Health Maintenance  Topic Date Due   COVID-19 Vaccine (4 - Booster) 11/30/2019   Zoster Vaccines- Shingrix (2 of 2) 12/24/2020   HEMOGLOBIN A1C  03/10/2021   OPHTHALMOLOGY EXAM  07/01/2021   FOOT EXAM  07/25/2021   TETANUS/TDAP  07/30/2025   COLONOSCOPY (Pts 45-2yrs Insurance coverage will need to be confirmed)  02/23/2026   INFLUENZA VACCINE  Completed   Hepatitis C Screening  Completed   PNA vac Low Risk Adult  Completed   HPV VACCINES  Aged Out     ----------------------------------------------------------------------------------------------------------------------------------------------------------------------------------------------------------------- Physical Exam BP 115/71 (BP Location: Left Arm, Patient Position: Sitting, Cuff Size: Large)   Pulse 61   Ht 5\' 10"  (1.778 m)   Wt 275 lb (124.7 kg)   SpO2 97%   BMI 39.46 kg/m   Physical Exam Constitutional:      Appearance: Normal appearance.  Eyes:     General: No  scleral icterus. Cardiovascular:     Rate and Rhythm: Normal rate and regular rhythm.  Pulmonary:     Effort: Pulmonary effort is normal.     Breath sounds: Normal breath sounds.  Musculoskeletal:     Cervical back: Neck supple.     Comments: Tenderness to palpation over left sciatic notch.  Range of motion of the hip is good with only mild pain with external rotation.  He does have pain with straight leg raise.  There is no weakness.  Neurological:     General: No focal deficit present.     Mental Status: He is alert.  Psychiatric:  Mood and Affect: Mood normal.        Behavior: Behavior normal.    ------------------------------------------------------------------------------------------------------------------------------------------------------------------------------------------------------------------- Assessment and Plan  Type II diabetes mellitus with complication Managed by endocrinology.  He is overdue for updated A1c.  Check today with lab work.  Encouraged continued compliance with medication and diet.  Benign essential tremor Stable with propranolol.  Continue at current strength.  Lumbar spondylosis History of lumbar spondylosis and current pain seems to be more radicular in nature rather than hip pain.  Updating x-ray of lumbar spine and will send in prescription for prednisone 50 mg daily x5 days.  Instructed to check blood sugars closely over the next few days.  BPH (benign prostatic hyperplasia) Update PSA.  Symptoms well managed with tamsulosin.Marland Kitchen  Hypertension associated with diabetes (HCC) Blood pressure remains well controlled at this time.  Recommend continuation at current strength.   Meds ordered this encounter  Medications   predniSONE (DELTASONE) 50 MG tablet    Sig: One tab PO daily for 5 days.    Dispense:  5 tablet    Refill:  0    Return in about 6 months (around 07/29/2021) for HTN/HLD.    This visit occurred during the SARS-CoV-2  public health emergency.  Safety protocols were in place, including screening questions prior to the visit, additional usage of staff PPE, and extensive cleaning of exam room while observing appropriate contact time as indicated for disinfecting solutions.

## 2021-01-29 NOTE — Assessment & Plan Note (Signed)
Stable with propranolol.  Continue at current strength.

## 2021-01-29 NOTE — Assessment & Plan Note (Signed)
History of lumbar spondylosis and current pain seems to be more radicular in nature rather than hip pain.  Updating x-ray of lumbar spine and will send in prescription for prednisone 50 mg daily x5 days.  Instructed to check blood sugars closely over the next few days.

## 2021-01-30 LAB — COMPLETE METABOLIC PANEL WITH GFR
AG Ratio: 1.4 (calc) (ref 1.0–2.5)
ALT: 21 U/L (ref 9–46)
AST: 15 U/L (ref 10–35)
Albumin: 3.9 g/dL (ref 3.6–5.1)
Alkaline phosphatase (APISO): 70 U/L (ref 35–144)
BUN: 7 mg/dL (ref 7–25)
CO2: 31 mmol/L (ref 20–32)
Calcium: 8.6 mg/dL (ref 8.6–10.3)
Chloride: 102 mmol/L (ref 98–110)
Creat: 0.78 mg/dL (ref 0.70–1.35)
Globulin: 2.8 g/dL (calc) (ref 1.9–3.7)
Glucose, Bld: 155 mg/dL — ABNORMAL HIGH (ref 65–99)
Potassium: 4 mmol/L (ref 3.5–5.3)
Sodium: 139 mmol/L (ref 135–146)
Total Bilirubin: 0.4 mg/dL (ref 0.2–1.2)
Total Protein: 6.7 g/dL (ref 6.1–8.1)
eGFR: 97 mL/min/{1.73_m2} (ref >=60)

## 2021-01-30 LAB — LIPID PANEL W/REFLEX DIRECT LDL
Cholesterol: 142 mg/dL (ref <200)
HDL: 37 mg/dL — ABNORMAL LOW (ref >=40)
LDL Cholesterol (Calc): 82 mg/dL (calc)
Non-HDL Cholesterol (Calc): 105 mg/dL (calc) (ref <130)
Total CHOL/HDL Ratio: 3.8 (calc) (ref <5.0)
Triglycerides: 126 mg/dL (ref <150)

## 2021-01-30 LAB — CBC WITH DIFFERENTIAL/PLATELET
Absolute Monocytes: 437 cells/uL (ref 200–950)
Basophils Absolute: 9 cells/uL (ref 0–200)
Basophils Relative: 0.2 %
Eosinophils Absolute: 198 cells/uL (ref 15–500)
Eosinophils Relative: 4.3 %
HCT: 42.1 % (ref 38.5–50.0)
Hemoglobin: 13.9 g/dL (ref 13.2–17.1)
Lymphs Abs: 1914 cells/uL (ref 850–3900)
MCH: 31.7 pg (ref 27.0–33.0)
MCHC: 33 g/dL (ref 32.0–36.0)
MCV: 95.9 fL (ref 80.0–100.0)
MPV: 9.7 fL (ref 7.5–12.5)
Monocytes Relative: 9.5 %
Neutro Abs: 2042 cells/uL (ref 1500–7800)
Neutrophils Relative %: 44.4 %
Platelets: 142 10*3/uL (ref 140–400)
RBC: 4.39 10*6/uL (ref 4.20–5.80)
RDW: 12.6 % (ref 11.0–15.0)
Total Lymphocyte: 41.6 %
WBC: 4.6 10*3/uL (ref 3.8–10.8)

## 2021-01-30 LAB — HEMOGLOBIN A1C
Hgb A1c MFr Bld: 8.2 % of total Hgb — ABNORMAL HIGH (ref <5.7)
Mean Plasma Glucose: 189 mg/dL
eAG (mmol/L): 10.4 mmol/L

## 2021-01-30 LAB — PSA: PSA: 1.01 ng/mL (ref <=4.00)

## 2021-02-03 ENCOUNTER — Ambulatory Visit: Payer: Medicare Other | Admitting: Family Medicine

## 2021-02-17 ENCOUNTER — Other Ambulatory Visit: Payer: Self-pay | Admitting: Family Medicine

## 2021-02-17 DIAGNOSIS — N451 Epididymitis: Secondary | ICD-10-CM

## 2021-02-27 DIAGNOSIS — Z20822 Contact with and (suspected) exposure to covid-19: Secondary | ICD-10-CM | POA: Diagnosis not present

## 2021-02-27 DIAGNOSIS — Z03818 Encounter for observation for suspected exposure to other biological agents ruled out: Secondary | ICD-10-CM | POA: Diagnosis not present

## 2021-03-18 DIAGNOSIS — Z7984 Long term (current) use of oral hypoglycemic drugs: Secondary | ICD-10-CM | POA: Diagnosis not present

## 2021-03-18 DIAGNOSIS — Z794 Long term (current) use of insulin: Secondary | ICD-10-CM | POA: Diagnosis not present

## 2021-03-18 DIAGNOSIS — E1165 Type 2 diabetes mellitus with hyperglycemia: Secondary | ICD-10-CM | POA: Diagnosis not present

## 2021-03-18 DIAGNOSIS — Z6839 Body mass index (BMI) 39.0-39.9, adult: Secondary | ICD-10-CM | POA: Diagnosis not present

## 2021-03-18 DIAGNOSIS — E230 Hypopituitarism: Secondary | ICD-10-CM | POA: Diagnosis not present

## 2021-03-18 DIAGNOSIS — Z7985 Long-term (current) use of injectable non-insulin antidiabetic drugs: Secondary | ICD-10-CM | POA: Diagnosis not present

## 2021-03-18 DIAGNOSIS — E785 Hyperlipidemia, unspecified: Secondary | ICD-10-CM | POA: Diagnosis not present

## 2021-03-18 DIAGNOSIS — I10 Essential (primary) hypertension: Secondary | ICD-10-CM | POA: Diagnosis not present

## 2021-03-18 DIAGNOSIS — E669 Obesity, unspecified: Secondary | ICD-10-CM | POA: Diagnosis not present

## 2021-03-18 DIAGNOSIS — E1142 Type 2 diabetes mellitus with diabetic polyneuropathy: Secondary | ICD-10-CM | POA: Diagnosis not present

## 2021-04-27 ENCOUNTER — Other Ambulatory Visit: Payer: Self-pay | Admitting: Interventional Cardiology

## 2021-04-28 DIAGNOSIS — B351 Tinea unguium: Secondary | ICD-10-CM | POA: Diagnosis not present

## 2021-04-28 DIAGNOSIS — E1142 Type 2 diabetes mellitus with diabetic polyneuropathy: Secondary | ICD-10-CM | POA: Diagnosis not present

## 2021-04-28 DIAGNOSIS — L84 Corns and callosities: Secondary | ICD-10-CM | POA: Diagnosis not present

## 2021-06-10 ENCOUNTER — Ambulatory Visit: Payer: Medicare Other | Admitting: Physician Assistant

## 2021-06-23 DIAGNOSIS — M2041 Other hammer toe(s) (acquired), right foot: Secondary | ICD-10-CM | POA: Diagnosis not present

## 2021-06-23 DIAGNOSIS — L84 Corns and callosities: Secondary | ICD-10-CM | POA: Diagnosis not present

## 2021-06-23 DIAGNOSIS — M10071 Idiopathic gout, right ankle and foot: Secondary | ICD-10-CM | POA: Diagnosis not present

## 2021-06-23 DIAGNOSIS — B351 Tinea unguium: Secondary | ICD-10-CM | POA: Diagnosis not present

## 2021-06-23 DIAGNOSIS — E1142 Type 2 diabetes mellitus with diabetic polyneuropathy: Secondary | ICD-10-CM | POA: Diagnosis not present

## 2021-06-23 DIAGNOSIS — M2042 Other hammer toe(s) (acquired), left foot: Secondary | ICD-10-CM | POA: Diagnosis not present

## 2021-07-01 ENCOUNTER — Other Ambulatory Visit: Payer: Self-pay | Admitting: Family Medicine

## 2021-07-21 DIAGNOSIS — E1165 Type 2 diabetes mellitus with hyperglycemia: Secondary | ICD-10-CM | POA: Diagnosis not present

## 2021-07-21 DIAGNOSIS — Z794 Long term (current) use of insulin: Secondary | ICD-10-CM | POA: Diagnosis not present

## 2021-07-21 DIAGNOSIS — I10 Essential (primary) hypertension: Secondary | ICD-10-CM | POA: Diagnosis not present

## 2021-07-21 DIAGNOSIS — E782 Mixed hyperlipidemia: Secondary | ICD-10-CM | POA: Diagnosis not present

## 2021-07-22 DIAGNOSIS — E230 Hypopituitarism: Secondary | ICD-10-CM | POA: Diagnosis not present

## 2021-07-22 DIAGNOSIS — Z7984 Long term (current) use of oral hypoglycemic drugs: Secondary | ICD-10-CM | POA: Diagnosis not present

## 2021-07-22 DIAGNOSIS — I10 Essential (primary) hypertension: Secondary | ICD-10-CM | POA: Diagnosis not present

## 2021-07-22 DIAGNOSIS — Z6838 Body mass index (BMI) 38.0-38.9, adult: Secondary | ICD-10-CM | POA: Diagnosis not present

## 2021-07-22 DIAGNOSIS — E785 Hyperlipidemia, unspecified: Secondary | ICD-10-CM | POA: Diagnosis not present

## 2021-07-22 DIAGNOSIS — Z794 Long term (current) use of insulin: Secondary | ICD-10-CM | POA: Diagnosis not present

## 2021-07-22 DIAGNOSIS — E669 Obesity, unspecified: Secondary | ICD-10-CM | POA: Diagnosis not present

## 2021-07-22 DIAGNOSIS — Z87891 Personal history of nicotine dependence: Secondary | ICD-10-CM | POA: Diagnosis not present

## 2021-07-22 DIAGNOSIS — T380X5A Adverse effect of glucocorticoids and synthetic analogues, initial encounter: Secondary | ICD-10-CM | POA: Diagnosis not present

## 2021-07-22 DIAGNOSIS — Z7985 Long-term (current) use of injectable non-insulin antidiabetic drugs: Secondary | ICD-10-CM | POA: Diagnosis not present

## 2021-07-22 DIAGNOSIS — E1165 Type 2 diabetes mellitus with hyperglycemia: Secondary | ICD-10-CM | POA: Diagnosis not present

## 2021-07-29 ENCOUNTER — Other Ambulatory Visit: Payer: Self-pay

## 2021-07-29 ENCOUNTER — Ambulatory Visit (INDEPENDENT_AMBULATORY_CARE_PROVIDER_SITE_OTHER): Payer: Medicare Other | Admitting: Family Medicine

## 2021-07-29 ENCOUNTER — Encounter: Payer: Self-pay | Admitting: Family Medicine

## 2021-07-29 VITALS — BP 138/80 | HR 76 | Temp 98.4°F | Ht 69.5 in | Wt 265.0 lb

## 2021-07-29 DIAGNOSIS — H401131 Primary open-angle glaucoma, bilateral, mild stage: Secondary | ICD-10-CM | POA: Insufficient documentation

## 2021-07-29 DIAGNOSIS — I152 Hypertension secondary to endocrine disorders: Secondary | ICD-10-CM | POA: Diagnosis not present

## 2021-07-29 DIAGNOSIS — N401 Enlarged prostate with lower urinary tract symptoms: Secondary | ICD-10-CM | POA: Diagnosis not present

## 2021-07-29 DIAGNOSIS — E1159 Type 2 diabetes mellitus with other circulatory complications: Secondary | ICD-10-CM

## 2021-07-29 DIAGNOSIS — E1149 Type 2 diabetes mellitus with other diabetic neurological complication: Secondary | ICD-10-CM | POA: Diagnosis not present

## 2021-07-29 DIAGNOSIS — Z7182 Exercise counseling: Secondary | ICD-10-CM | POA: Insufficient documentation

## 2021-07-29 DIAGNOSIS — R350 Frequency of micturition: Secondary | ICD-10-CM

## 2021-07-29 DIAGNOSIS — E039 Hypothyroidism, unspecified: Secondary | ICD-10-CM | POA: Diagnosis not present

## 2021-07-29 DIAGNOSIS — E782 Mixed hyperlipidemia: Secondary | ICD-10-CM

## 2021-07-29 DIAGNOSIS — Z7189 Other specified counseling: Secondary | ICD-10-CM | POA: Insufficient documentation

## 2021-07-29 DIAGNOSIS — E1142 Type 2 diabetes mellitus with diabetic polyneuropathy: Secondary | ICD-10-CM | POA: Diagnosis not present

## 2021-07-29 LAB — TSH: TSH: 3.96 mIU/L (ref 0.40–4.50)

## 2021-07-29 LAB — PSA: PSA: 1.04 ng/mL (ref <=4.00)

## 2021-07-29 MED ORDER — TRAZODONE HCL 50 MG PO TABS: ORAL_TABLET | ORAL | 3 refills | Status: DC

## 2021-07-29 MED ORDER — LOSARTAN POTASSIUM 25 MG PO TABS: 25.0000 mg | ORAL_TABLET | Freq: Every day | ORAL | 1 refills | Status: DC

## 2021-07-29 MED ORDER — TAMSULOSIN HCL 0.4 MG PO CAPS: 0.4000 mg | ORAL_CAPSULE | Freq: Every day | ORAL | 1 refills | Status: DC

## 2021-07-29 MED ORDER — OMEPRAZOLE 40 MG PO CPDR: 40.0000 mg | DELAYED_RELEASE_CAPSULE | Freq: Every day | ORAL | 3 refills | Status: DC

## 2021-07-29 MED ORDER — PROPRANOLOL HCL ER 60 MG PO CP24: 60.0000 mg | ORAL_CAPSULE | Freq: Every day | ORAL | 3 refills | Status: DC

## 2021-07-29 NOTE — Assessment & Plan Note (Signed)
Doing well with Crestor and Zetia.  Continue at current strength.   ?

## 2021-07-29 NOTE — Progress Notes (Signed)
Gregory Ready [name] - 71 y.o. male MRN [id number]  Date of birth: Jun 26, 1950  Subjective Chief Complaint  Patient presents with   Follow-up   Hypertension   Hyperlipidemia    HPI Gregory West is a 71 year old male here today for follow-up visit.  He does see primary care through the [place] and is [facility]  Di[facility].  Currently taking Semglee, Jardiance and metformin.  Feels like he is tolerating medications well.  Most recent A1c was 8.6%.  Continues to do well with trazodone for management of insomnia.  BPH symptoms remain well controlled with Flomax.   Blood pressure mains well controlled current medications.  He denies chest pain, shortness of breath, palpitations, headaches or vision changes.  ROS:  A comprehensive ROS was completed and negative except as noted per HPI  Allergies  Allergen Reactions   Statins     Muscle cramping all over body. Lipitor, Crestor, and pravastatin.   Lisinopril Cough   Victoza [Liraglutide] Other (See Comments)    Caused pancreatitis    Past Medical History:  Diagnosis Date   Arthritis    Chest pain    a. Normal cors 2001. b. Neg stress test 2012, 05/2014.   Diabetes mellitus without complication (HCC)    GERD (gastroesophageal reflux disease)    Hyperlipidemia    Hypertension    Hypertensive response to exercise    Hypothyroidism    Obesity    Obstructive sleep apnea    Currently untreated    PONV (postoperative nausea and vomiting)     Past Surgical History:  Procedure Laterality Date   CARDIAC CATHETERIZATION     CERVICAL FUSION     HERNIA REPAIR     KNEE ARTHROSCOPY  1984   both knee   SHOULDER ARTHROSCOPY WITH SUBACROMIAL DECOMPRESSION Right 09/13/2019   Procedure: RIGHT SHOULDER ARTHROSCOPY WITH EXTENSIVE DEBRIDEMENT, SUBACROMIAL DECOMPRESSION,;  Surgeon: Kathryne Hitch, MD;  Location: [facility];  Service: Orthopedics;   Laterality: Right;    Social History   Socioeconomic History   Marital status: Married    Spouse name: [name]   Number of children: 2   Years of education: 16   Highest education level: Bachelor's degree (e.g., BA, AB, BS)  Occupational History    Comment: Retired    Comment: working part time  Tobacco Use   Smoking status: Former    Packs/day: 1.50    Years: 30.00    Pack years: 45.00    Types: Cigarettes    Quit date: 1995    Years since quitting: 28.1   Smokeless tobacco: Never  Vaping Use   Vaping Use: Never used  Substance and Sexual Activity   Alcohol use: No   Drug use: No   Sexual activity: Not on file  Other Topics Concern   Not on file  Social History Narrative   Lives with his wife. He is working working part-time (sometimes). He likes to read his bible and go fishing in his free time.    Social Determinants of Health   Financial Resource Strain: Low Risk    Difficulty of Paying Living Expenses: Not hard at all  Food Insecurity: No Food Insecurity   Worried About Programme researcher, broadcasting/film/video in the Last Year: Never true   Ran Out of Food in the Last Year: Never true  Transportation Needs: No Transportation Needs   Lack of Transportation (Medical): No   Lack of Transportation (Non-Medical): No  Physical Activity: Sufficiently Active   Days of Exercise per Week: 3 days   Minutes of Exercise per Session: 50 min  Stress: No Stress Concern Present   Feeling of Stress : Not at all  Social Connections: Socially Integrated   Frequency of Communication with Friends and Family: More than three times a week   Frequency of Social Gatherings with Friends and Family: Three times a week   Attends Religious Services: More than 4 times per year   Active Member of Clubs or Organizations: Yes   Attends Engineer, structural: More than 4 times per year   Marital Status: Married    Family History  Problem Relation Age of Onset   Diabetes Mother    Heart disease  Mother    Hypertension Mother    Stroke Mother    Heart attack Mother    Cancer Father    Heart disease Father    Hyperlipidemia Sister    Hypertension Sister    Diabetes Maternal Grandmother    Stroke Maternal Grandmother    Heart attack Maternal Grandmother    Diabetes Maternal Grandfather    Hypertension Maternal Grandfather    Heart attack Maternal Grandfather    Diabetes Sister    Hypertension Sister    Heart attack Paternal Grandmother    Heart attack Paternal Grandfather     Health Maintenance  Topic Date Due   COVID-19 Vaccine (4 - Booster) 10/26/2019   Zoster Vaccines- Shingrix (2 of 2) 12/24/2020   OPHTHALMOLOGY EXAM  07/01/2021   FOOT EXAM  07/25/2021   HEMOGLOBIN A1C  07/29/2021   TETANUS/TDAP  07/30/2025   COLONOSCOPY (Pts 45-46yrs Insurance coverage will need to be confirmed)  02/23/2026   Pneumonia Vaccine 38+ Years old  Completed   INFLUENZA VACCINE  Completed   Hepatitis C Screening  Completed   HPV VACCINES  Aged Out     ----------------------------------------------------------------------------------------------------------------------------------------------------------------------------------------------------------------- Physical Exam BP 138/80    Pulse 76    Temp 98.4 F (36.9 C)    Ht 5' 9.5" (1.765 m)    Wt 265 lb (120.2 kg)    SpO2 98%    BMI 38.57 kg/m   Physical Exam Constitutional:      General: He is not in acute distress. HENT:     Head: Normocephalic and atraumatic.  Eyes:     General: No scleral icterus. Neck:     Thyroid: No thyromegaly.  Cardiovascular:     Rate and Rhythm: Normal rate and regular rhythm.     Heart sounds: Normal heart sounds.  Pulmonary:     Effort: Pulmonary effort is normal.     Breath sounds: Normal breath sounds.  Abdominal:     General: Bowel sounds are normal. There is no distension.     Palpations: Abdomen is soft.     Tenderness: There is no abdominal tenderness. There is no guarding.   Musculoskeletal:     Cervical back: Normal range of motion.  Lymphadenopathy:     Cervical: No cervical adenopathy.  Skin:    General: Skin is warm and dry.     Findings: No rash.  Neurological:     Mental Status: He is alert and oriented to person, place, and time.     Cranial Nerves: No cranial nerve deficit.     Motor: No abnormal muscle tone.  Psychiatric:        Mood and Affect: Mood normal.        Behavior: Behavior normal.    -------------------------------------------------------------------------------------------------------------------------------------------------------------------------------------------------------------------  Assessment and Plan  Hypertension associated with diabetes (HCC) Blood pressure mains well controlled current medications.  Recommend continuation of antihypertensive medications at current strength.  Type 2 diabetes mellitus with neurological complications [facility]) Management per endocrinology.  Most recent A1c of 8.6%.  Recommend continued work on diet in addition to current medications.  BPH (benign prostatic hyperplasia) Update PSA.  Continue Flomax.  Hyperlipidemia Doing well with Crestor and Zetia.  Continue at current strength.     Meds ordered this encounter  Medications   losartan (COZAAR) 25 MG tablet    Sig: Take 1 tablet (25 mg total) by mouth daily.    Dispense:  90 tablet    Refill:  1   omeprazole (PRILOSEC) 40 MG capsule    Sig: Take 1 capsule (40 mg total) by mouth daily.    Dispense:  90 capsule    Refill:  3   propranolol ER (INDERAL LA) 60 MG 24 hr capsule    Sig: Take 1 capsule (60 mg total) by mouth daily.    Dispense:  90 capsule    Refill:  3   tamsulosin (FLOMAX) 0.4 MG CAPS capsule    Sig: Take 1 capsule (0.4 mg total) by mouth daily.    Dispense:  90 capsule    Refill:  1   traZODone (DESYREL) 50 MG tablet    Sig: TAKE ONE-HALF (1/2) TO ONE TABLET AT BEDTIME AS NEEDED FOR SLEEP    Dispense:  90 tablet     Refill:  3    Return in about 6 months (around 01/29/2022) for HTN/Thyroid.    This visit occurred during the SARS-CoV-2 public health emergency.  Safety protocols were in place, including screening questions prior to the visit, additional usage of staff PPE, and extensive cleaning of exam room while observing appropriate contact time as indicated for disinfecting solutions.

## 2021-07-29 NOTE — Assessment & Plan Note (Signed)
Update PSA.  Continue Flomax. ?

## 2021-07-29 NOTE — Assessment & Plan Note (Signed)
Management per endocrinology.  Most recent A1c of 8.6%.  Recommend continued work on diet in addition to current medications. ?

## 2021-07-29 NOTE — Assessment & Plan Note (Signed)
Blood pressure mains well controlled current medications.  Recommend continuation of antihypertensive medications at current strength. ?

## 2021-08-04 NOTE — Progress Notes (Signed)
Cardiology Office Note:    Date:  08/10/2021   ID:  Gregory Ready [name], DOB 06-10-1950, MRN [id number]  PCP:  [name], DO   [facility] Providers Cardiologist:  Lesleigh Noe, MD     Referring MD: [name], DO   Chief Complaint: one year follow-up chest pain and hyperlipidemia  History of Present Illness:    Gregory ROPER [name] is a 71 y.o. male with a hx of HTN, diabetes, AAA, OSA on CPAP, chest pain, hyperlipidemia, and GERD.   Past history significant for cardiac catheterization in 2001 with normal coronary arteries and normal LV function. He had a normal nuclear stress test in 2017.   He was last seen by Dr. Katrinka Blazing on 05/09/20 for evaluation of left parasternal chest pain. He reported pain would last for hours and began several months prior. He had no exertional discomfort. Coronary CTA 07/2019 revealed calcium score of zero and normal coronary arteries. He was advised to see GI for evaluation of GERD and to follow up in 1 to 2 years or sooner if change in quality of discomfort.   Today, he is here alone for follow-up.  He reports no chest pain for the past 6 to 9 months since he started taking his omeprazole as directed.  Has returned to the gym 2 days/week and is physically active at work frequently up and down stairs, heavy lifting, lots of steps without chest discomfort or dyspnea.  Reports mild occasional leg edema for which he wears compression stocks and elevates legs at night. He does not take Lasix often because of frequent urination due to BPH. Compliant on CPAP. He denies chest pain, shortness of breath, fatigue, palpitations, melena, hematuria, hemoptysis, diaphoresis, weakness, presyncope, syncope, orthopnea, and PND. Denies specific cardiac complaint today. Has frequent lab work with endocrinologist and also sees PCP regularly. Recent adjustment to diabetes medications due to elevated A1C and CBG.   Past Medical History:  Diagnosis Date    Arthritis    Chest pain    a. Normal cors 2001. b. Neg stress test 2012, 05/2014.   Diabetes mellitus without complication (HCC)    GERD (gastroesophageal reflux disease)    Hyperlipidemia    Hypertension    Hypertensive response to exercise    Hypothyroidism    Obesity    Obstructive sleep apnea    Currently untreated    PONV (postoperative nausea and vomiting)     Past Surgical History:  Procedure Laterality Date   CARDIAC CATHETERIZATION     CERVICAL FUSION     HERNIA REPAIR     KNEE ARTHROSCOPY  1984   both knee   SHOULDER ARTHROSCOPY WITH SUBACROMIAL DECOMPRESSION Right 09/13/2019   Procedure: RIGHT SHOULDER ARTHROSCOPY WITH EXTENSIVE DEBRIDEMENT, SUBACROMIAL DECOMPRESSION,;  Surgeon: Kathryne Hitch, MD;  Location: [facility];  Service: Orthopedics;  Laterality: Right;    Current Medications: Current Meds  Medication Sig   aspirin 81 MG tablet Take 81 mg by mouth daily.   carboxymethylcellulose (REFRESH PLUS) 0.5 % SOLN INSTILL 1 DROP IN BOTH EYES FOUR TIMES A DAY   Cholecalciferol (VITAMIN D3) 5000 units CAPS Take 1 capsule by mouth daily.   cyclobenzaprine (FLEXERIL) 10 MG tablet TAKE ONE-HALF TO ONE TABLET BY MOUTH DAILY AS NEEDED FOR MUSCLE SPASMS -AS DIRECTED   Dulaglutide (TRULICITY) 1.5 MG/0.5ML SOPN Inject 1.5 mg into the skin once a week.   empagliflozin (JARDIANCE) 25 MG TABS tablet Take 25 mg by mouth daily.  Evolocumab (REPATHA SURECLICK) 140 MG/ML SOAJ Inject 140 mg into the skin every 14 (fourteen) days.   ezetimibe (ZETIA) 10 MG tablet Take 1 tablet (10 mg total) by mouth daily. Please keep upcoming appt in January 2023 with Cardiologist before anymore refills. Thank you   furosemide (LASIX) 20 MG tablet Take 20 mg by mouth daily. Patient takes it as needed. 09/15/20   insulin glargine (LANTUS) 100 UNIT/ML injection Inject 40 Units into the skin 2 (two) times daily.   insulin glargine-yfgn (SEMGLEE) 100 UNIT/ML injection INJECT 40  UNITS SUBCUTANEOUSLY TWICE A DAY   latanoprost (XALATAN) 0.005 % ophthalmic solution INSTILL 1 DROP IN EACH EYE AT BEDTIME   levothyroxine (SYNTHROID) 25 MCG tablet Take 25 mcg by mouth daily before breakfast.   losartan (COZAAR) 25 MG tablet Take 1 tablet (25 mg total) by mouth daily.   meloxicam (MOBIC) 15 MG tablet One tab PO qAM with a meal for 2 weeks, then daily prn pain.   metFORMIN (GLUCOPHAGE-XR) 750 MG 24 hr tablet TAKE 1 TABLET TWICE A DAY BEFORE MEALS   Omega-3 Fatty Acids (FISH OIL) 1000 MG CAPS Take by mouth. Takes 3 capsules a day   omeprazole (PRILOSEC) 40 MG capsule Take 1 capsule (40 mg total) by mouth daily.   ONETOUCH VERIO test strip 1 each by Other route as needed.    potassium chloride (MICRO-K) 10 MEQ CR capsule TAKE ONE CAPSULE BY MOUTH DAILY AS NEEDED -TAKE WITH LASIX   POTASSIUM PO Take 10 mEq by mouth. Takes it as needed on the same day as lasix   propranolol ER (INDERAL LA) 60 MG 24 hr capsule Take 1 capsule (60 mg total) by mouth daily.   rosuvastatin (CRESTOR) 5 MG tablet Take 1 tablet by mouth daily.   tamsulosin (FLOMAX) 0.4 MG CAPS capsule Take 1 capsule (0.4 mg total) by mouth daily.   traZODone (DESYREL) 50 MG tablet TAKE ONE-HALF (1/2) TO ONE TABLET AT BEDTIME AS NEEDED FOR SLEEP   vitamin B-12 (CYANOCOBALAMIN) 500 MCG tablet TAKE TWO TABLETS BY MOUTH     Allergies:   Statins, Lisinopril, and Victoza [liraglutide]   Social History   Socioeconomic History   Marital status: Married    Spouse name: Elease Hashimotoatricia   Number of children: 2   Years of education: 16   Highest education level: Bachelor's degree (e.g., BA, AB, BS)  Occupational History    Comment: Retired    Comment: working part time  Tobacco Use   Smoking status: Former    Packs/day: 1.50    Years: 30.00    Pack years: 45.00    Types: Cigarettes    Quit date: 1995    Years since quitting: 28.2   Smokeless tobacco: Never  Vaping Use   Vaping Use: Never used  Substance and Sexual  Activity   Alcohol use: No   Drug use: No   Sexual activity: Not on file  Other Topics Concern   Not on file  Social History Narrative   Lives with his wife. He is working working part-time (sometimes). He likes to read his bible and go fishing in his free time.    Social Determinants of Health   Financial Resource Strain: Low Risk    Difficulty of Paying Living Expenses: Not hard at all  Food Insecurity: No Food Insecurity   Worried About Programme researcher, broadcasting/film/videounning Out of Food in the Last Year: Never true   Ran Out of Food in the Last Year: Never true  Transportation Needs:  No Transportation Needs   Lack of Transportation (Medical): No   Lack of Transportation (Non-Medical): No  Physical Activity: Sufficiently Active   Days of Exercise per Week: 3 days   Minutes of Exercise per Session: 50 min  Stress: No Stress Concern Present   Feeling of Stress : Not at all  Social Connections: Socially Integrated   Frequency of Communication with Friends and Family: More than three times a week   Frequency of Social Gatherings with Friends and Family: Three times a week   Attends Religious Services: More than 4 times per year   Active Member of Clubs or Organizations: Yes   Attends Engineer, structuralClub or Organization Meetings: More than 4 times per year   Marital Status: Married     Family History: The patient's family history includes Cancer in his father; Diabetes in his maternal grandfather, maternal grandmother, mother, and sister; Heart attack in his maternal grandfather, maternal grandmother, mother, paternal grandfather, and paternal grandmother; Heart disease in his father and mother; Hyperlipidemia in his sister; Hypertension in his maternal grandfather, mother, sister, and sister; Stroke in his maternal grandmother and mother.  ROS:   Please see the history of present illness.  All other systems reviewed and are negative.  Labs/Other Studies Reviewed:    The following studies were reviewed today:  Coronary CT  07/2018  IMPRESSION: 1. Calcium score 0   2.  Normal co dominant coronary arteries   3.  Aortic root 3.8 cm    Exercise Myoview 07/2015  Nuclear stress EF: 54%. There was no ST segment deviation noted during stress. 5:2330min. 4/10 CP This is a low risk study. No ischemia. No perfusion defects.  Recent Labs: 01/29/2021: ALT 21; BUN 7; Creat 0.78; Hemoglobin 13.9; Platelets 142; Potassium 4.0; Sodium 139 07/29/2021: TSH 3.96  Recent Lipid Panel    Component Value Date/Time   CHOL 142 01/29/2021 0000   CHOL 125 07/09/2020 0744   TRIG 126 01/29/2021 0000   HDL 37 (L) 01/29/2021 0000   HDL 34 (L) 07/09/2020 0744   CHOLHDL 3.8 01/29/2021 0000   VLDL 22 04/01/2015 0000   LDLCALC 82 01/29/2021 0000     Risk Assessment/Calculations:       Physical Exam:    VS:  BP 130/72    Pulse 76    Ht 5' 9.5" (1.765 m)    Wt 267 lb 9.6 oz (121.4 kg)    BMI 38.95 kg/m     Wt Readings from Last 3 Encounters:  08/10/21 267 lb 9.6 oz (121.4 kg)  07/29/21 265 lb (120.2 kg)  01/29/21 275 lb (124.7 kg)     GEN:  Well nourished, well developed in no acute distress HEENT: Normal NECK: No JVD; No carotid bruits CARDIAC: RRR, no murmurs, rubs, gallops RESPIRATORY:  Clear to auscultation without rales, wheezing or rhonchi  ABDOMEN: Soft, non-tender, non-distended MUSCULOSKELETAL:  No edema; No deformity. 2+ pedal pulses, equal bilaterally SKIN: Warm and dry NEUROLOGIC:  Alert and oriented x 3 PSYCHIATRIC:  Normal affect   EKG:  EKG is ordered today.  The ekg ordered today demonstrates NSR at 76 bpm, no ST/T wave abnormality.  Diagnoses:    1. Essential hypertension   2. Hyperlipidemia LDL goal <70   3. Type II diabetes mellitus with complication (HCC)   4. Chest pain of uncertain etiology   5. Aortic atherosclerosis (HCC)    Assessment and Plan:     Chest pain: Chest pain has improved over the last 6 to 9  months since he reports he started taking his omeprazole as directed.  He denies  chest pain, dyspnea, or other symptoms concerning for angina and has no activity limitations. No indication for further ischemia evaluation at this time. Continue aspirin.   Essential hypertension: Blood pressures well controlled today.  He reports that has been well controlled at other office visits. Encouraged low sodium heart healthy diet, regular exercise, and weight loss. Information given to patient on AVS. Continue Inderal, losartan  Aortic atherosclerosis/Hyperlipidemia LDL goal < 70: LDL 130 on 6/96/7893.  He is on low dose Crestor and Zetia. Was unable to tolerate higher dose statin - made him ache all over.  We discussed importance of lower LDL in the setting of diabetes and aortic atherosclerosis.  Discussed PCSK9 inhibitors and he is agreeable to try Repatha or Praluent if not cost prohibitive.  If he starts a new agent, we will recheck lipids in 3 months  Diabetes: Recent change in treatment plan to improve A1C. He admits to some dietary indiscretion with soda. Management per Endocrinology.    OSA on CPAP: Reports compliance with CPAP.  No further concerns today.     Disposition: 1 year with Dr. Katrinka Blazing or APP   Medication Adjustments/Labs and Tests Ordered: Current medicines are reviewed at length with the patient today.  Concerns regarding medicines are outlined above.  Orders Placed This Encounter  Procedures   EKG 12-Lead   Meds ordered this encounter  Medications   Evolocumab (REPATHA SURECLICK) 140 MG/ML SOAJ    Sig: Inject 140 mg into the skin every 14 (fourteen) days.    Dispense:  2 mL    Refill:  11    Order Specific Question:   Supervising Provider    Answer:   Vesta Mixer [phone]    Patient Instructions  Medication Instructions:  Your physician has recommended you make the following change in your medication:  START Repatha (140 mg) subq injection every two weeks.    *If you need a refill on your cardiac medications before your next appointment, please  call your pharmacy*   Lab Work: None If you have labs (blood work) drawn today and your tests are completely normal, you will receive your results only by: MyChart Message (if you have MyChart) OR A paper copy in the mail If you have any lab test that is abnormal or we need to change your treatment, we will call you to review the results.   Testing/Procedures: None Ordered   Follow-Up: At [facility], you and your health needs are our priority.  As part of our continuing mission to provide you with exceptional heart care, we have created designated Provider Care Teams.  These Care Teams include your primary Cardiologist (physician) and Advanced Practice Providers (APPs -  Physician Assistants and Nurse Practitioners) who all work together to provide you with the care you need, when you need it.  We recommend signing up for the patient portal called "MyChart".  Sign up information is provided on this After Visit Summary.  MyChart is used to connect with patients for Virtual Visits (Telemedicine).  Patients are able to view lab/test results, encounter notes, upcoming appointments, etc.  Non-urgent messages can be sent to your provider as well.   To learn more about what you can do with MyChart, go to [email or web address].    Your next appointment:   1 year(s)  The format for your next appointment:   In Person  Provider:   Lyn Records III,  MD  Or Gregory Bridegroom, NP  If primary card or EP is not listed click here to update    :1}    Other Instructions  Exercise recommendations: Goal of exercising for at least 30 minutes a day, at least 5 times per week.  Please exercise to a moderate exertion.  This means that while exercising it is difficult to speak in full sentences, however you are not so short of breath that you feel you must stop, and not so comfortable that you can carry on a full conversation.  Exertion level should be approximately a 5/10, if 10 is the most  exertion you can perform.  Diet recommendations: Recommend a heart healthy diet such as the Mediterranean diet.  This diet consists of plant based foods, healthy fats, lean meats, olive oil.  It suggests limiting the intake of simple carbohydrates such as white breads, pastries, and pastas.  It also limits the amount of red meat, wine, and dairy products such as cheese that one should consume on a daily basis.      Signed, Levi Aland, NP  08/10/2021 12:56 PM    [facility]

## 2021-08-10 ENCOUNTER — Other Ambulatory Visit: Payer: Self-pay

## 2021-08-10 ENCOUNTER — Encounter: Payer: Self-pay | Admitting: Nurse Practitioner

## 2021-08-10 ENCOUNTER — Ambulatory Visit (INDEPENDENT_AMBULATORY_CARE_PROVIDER_SITE_OTHER): Payer: Medicare Other | Admitting: Nurse Practitioner

## 2021-08-10 VITALS — BP 130/72 | HR 76 | Ht 69.5 in | Wt 267.6 lb

## 2021-08-10 DIAGNOSIS — E118 Type 2 diabetes mellitus with unspecified complications: Secondary | ICD-10-CM | POA: Diagnosis not present

## 2021-08-10 DIAGNOSIS — I700 Atherosclerosis of aorta: Secondary | ICD-10-CM

## 2021-08-10 DIAGNOSIS — I10 Essential (primary) hypertension: Secondary | ICD-10-CM

## 2021-08-10 DIAGNOSIS — R079 Chest pain, unspecified: Secondary | ICD-10-CM | POA: Diagnosis not present

## 2021-08-10 DIAGNOSIS — G4733 Obstructive sleep apnea (adult) (pediatric): Secondary | ICD-10-CM | POA: Diagnosis not present

## 2021-08-10 DIAGNOSIS — E785 Hyperlipidemia, unspecified: Secondary | ICD-10-CM

## 2021-08-10 MED ORDER — REPATHA SURECLICK 140 MG/ML ~~LOC~~ SOAJ: 140.0000 mg | SUBCUTANEOUS | 11 refills | Status: DC

## 2021-08-10 NOTE — Patient Instructions (Addendum)
Medication Instructions:  ?Your physician has recommended you make the following change in your medication:  ?START Repatha (140 mg) subq injection every two weeks.  ? ? ?*If you need a refill on your cardiac medications before your next appointment, please call your pharmacy* ? ? ?Lab Work: ?None ?If you have labs (blood work) drawn today and your tests are completely normal, you will receive your results only by: ?MyChart Message (if you have MyChart) OR ?A paper copy in the mail ?If you have any lab test that is abnormal or we need to change your treatment, we will call you to review the results. ? ? ?Testing/Procedures: ?None Ordered ? ? ?Follow-Up: ?At [facility], you and your health needs are our priority.  As part of our continuing mission to provide you with exceptional heart care, we have created designated Provider Care Teams.  These Care Teams include your primary Cardiologist (physician) and Advanced Practice Providers (APPs -  Physician Assistants and Nurse Practitioners) who all work together to provide you with the care you need, when you need it. ? ?We recommend signing up for the patient portal called "MyChart".  Sign up information is provided on this After Visit Summary.  MyChart is used to connect with patients for Virtual Visits (Telemedicine).  Patients are able to view lab/test results, encounter notes, upcoming appointments, etc.  Non-urgent messages can be sent to your provider as well.   ?To learn more about what you can do with MyChart, go to [email or web address].   ? ?Your next appointment:   ?1 year(s) ? ?The format for your next appointment:   ?In Person ? ?Provider:   ?Lesleigh Noe, MD  ?Or Eligha Bridegroom, NP ? ?If primary card or EP is not listed click here to update    :1}  ? ? ?Other Instructions ? ?Exercise recommendations: ?Goal of exercising for at least 30 minutes a day, at least 5 times per week.  Please exercise to a moderate exertion.  This means that while  exercising it is difficult to speak in full sentences, however you are not so short of breath that you feel you must stop, and not so comfortable that you can carry on a full conversation.  Exertion level should be approximately a 5/10, if 10 is the most exertion you can perform. ? ?Diet recommendations: ?Recommend a heart healthy diet such as the Mediterranean diet.  This diet consists of plant based foods, healthy fats, lean meats, olive oil.  It suggests limiting the intake of simple carbohydrates such as white breads, pastries, and pastas.  It also limits the amount of red meat, wine, and dairy products such as cheese that one should consume on a daily basis. ? ?  ?

## 2021-09-15 ENCOUNTER — Ambulatory Visit (INDEPENDENT_AMBULATORY_CARE_PROVIDER_SITE_OTHER): Payer: Medicare Other | Admitting: Family Medicine

## 2021-09-15 ENCOUNTER — Encounter: Payer: Self-pay | Admitting: Family Medicine

## 2021-09-15 VITALS — BP 133/62 | HR 75 | Ht 69.5 in | Wt 242.0 lb

## 2021-09-15 DIAGNOSIS — L821 Other seborrheic keratosis: Secondary | ICD-10-CM | POA: Diagnosis not present

## 2021-09-15 DIAGNOSIS — R21 Rash and other nonspecific skin eruption: Secondary | ICD-10-CM

## 2021-09-15 MED ORDER — CLOTRIMAZOLE-BETAMETHASONE 1-0.05 % EX CREA: 1.0000 "application " | TOPICAL_CREAM | Freq: Two times a day (BID) | CUTANEOUS | 0 refills | Status: AC

## 2021-09-15 NOTE — Progress Notes (Signed)
? ?  Acute Office Visit ? ?Subjective:  ? ?  ?Patient ID: [name], male    DOB: [birth year], 71 y.o.   MRN: [id number] ? ?Chief Complaint  ?Patient presents with  ? Rash  ? ? ?HPI ?Patient is in today for Rash under left arm.  Recently changed detergents but have used it before.  He says yesterday it was particularly itchy but it actually feels a little bit better today.  He is never really had issues with rash under the axilla before.  He says he has had some skin tags removed before. ? ?He also has some skin lesions on his back that he would like me to look at today he gets some itching on the skin in general and will use one of his wife's creams which does seem to help.  He says his wife is more concerned about the skin lesions than he is.  He they says they have been there for a long time. ? ?ROS ? ? ?   ?Objective:  ?  ?BP 133/62   Pulse 75   Ht 5' 9.5" (1.765 m)   Wt 242 lb (109.8 kg)   SpO2 98%   BMI 35.22 kg/m?  ? ? ?Physical Exam ?Skin: ?   Comments: He has a little bit of hyperpigmentation in the left axilla compared to the right but no significant erythema today.  He has does have deodorant on.  On his back he has a diffuse number of seborrheic keratoses.  None appear to be irritated inflamed or bleeding.  ? ? ?No results found for any visits on 09/15/21. ? ? ?   ?Assessment & Plan:  ? ?Problem List Items Addressed This Visit   ? ?  ? Musculoskeletal and Integument  ? Seborrheic keratoses  ? ?Other Visit Diagnoses   ? ? Rash and nonspecific skin eruption    -  Primary  ? Relevant Medications  ? clotrimazole-betamethasone (LOTRISONE) cream  ? ?  ? ?Rash, left axilla-we will treat with Lotrisone cream.  If not improving at the end of the week then please let us know.  Recommend full 10-day treatment if helpful.  Unclear if it may be the new detergent.  No new deodorants etc. ? ?Seborrheic keratoses on his back he has quite a diffuse patch of them on his back.  None look concerning or  worrisome.  Discussed benign nature of these lesions and certainly if he has some that are irritating ? ?Meds ordered this encounter  ?Medications  ? clotrimazole-betamethasone (LOTRISONE) cream  ?  Sig: Apply 1 application. topically 2 (two) times daily. X 10 days  ?  Dispense:  30 g  ?  Refill:  0  ? ? ?Return if symptoms worsen or fail to improve. ? ?Nani Gasser, MD ? ? ?

## 2021-09-16 ENCOUNTER — Ambulatory Visit (INDEPENDENT_AMBULATORY_CARE_PROVIDER_SITE_OTHER): Payer: Medicare Other | Admitting: Family Medicine

## 2021-09-16 DIAGNOSIS — Z0000 Encounter for general adult medical examination without abnormal findings: Secondary | ICD-10-CM

## 2021-09-16 NOTE — Progress Notes (Signed)
? ? ?MEDICARE ANNUAL WELLNESS VISIT ? ?09/16/2021 ? ?Telephone Visit Disclaimer ?This Medicare AWV was conducted by telephone due to national recommendations for restrictions regarding the COVID-19 Pandemic (e.g. social distancing).  I verified, using two identifiers, that I am speaking with Gregory Catching [name] or their authorized healthcare agent. I discussed the limitations, risks, security, and privacy concerns of performing an evaluation and management service by telephone and the potential availability of an in-person appointment in the future. The patient expressed understanding and agreed to proceed.  ?Location of Patient: Home ?Location of Provider (nurse):  In the office. ? ?Subjective:  ? ? ?Gregory Eusebio Me [name] is a 71 y.o. male patient of [name], DO who had a Medicare Annual Wellness Visit today via telephone. Lendal is Retired and lives with their spouse. he has 2 children. he reports that he is socially active and does interact with friends/family regularly. he is minimally physically active and enjoys reading his bible and go fishing in his free time. . ? ?Patient Care Team: ?[name], DO as PCP - General (Family Medicine) ?Belva Crome, MD as PCP - Cardiology (Cardiology) ?Altheimer, Legrand Como, MD as Consulting Physician (Endocrinology) ?Boyd Kerbs, MD as Referring Physician (Specialist) ? ? ?  09/16/2021  ?  8:09 AM 11/03/2020  ?  9:39 AM 09/15/2020  ?  9:13 AM 09/06/2019  ? 10:36 AM 03/22/2019  ?  8:39 PM 07/06/2018  ?  2:43 PM 08/25/2016  ?  4:05 PM  ?Advanced Directives  ?Does Patient Have a Medical Advance Directive? Yes Yes No Yes Yes Yes No  ?Type of Paramedic of [place];Living will [place];Living will   Living will Living will;Healthcare Power of Attorney   ?Does patient want to make changes to medical advance directive? No - Patient declined   No - Patient declined     ?Copy of [place] in Chart? No -  copy requested No - copy requested  No - copy requested     ?Would patient like information on creating a medical advance directive?   No - Patient declined    No - Patient declined  ? ? ?Hospital Utilization Over the Past 12 Months: ?# of hospitalizations or ER visits: 0 ?# of surgeries: 0 ? ?Review of Systems    ?Patient reports that his overall health is better compared to last year. ? ?History obtained from chart review and the patient ? ?Patient Reported Readings (BP, Pulse, CBG, Weight, etc) ?none ? ?Pain Assessment ?Pain : 0-10 ?Pain Location: Hand ?Pain Orientation: Left, Right ?Pain Descriptors / Indicators: Constant ?Pain Onset: More than a month ago ?Pain Frequency: Constant ?Pain Relieving Factors: Voltaren gel and flexeril ? ?Pain Relieving Factors: Voltaren gel and flexeril ? ?Current Medications & Allergies (verified) ?Allergies as of 09/16/2021   ? ?   Reactions  ? Statins   ? Muscle cramping all over body. ?Lipitor, Crestor, and pravastatin.  ? Atorvastatin   ? Other reaction(s): "severe cramps in muscles and tendons"  ? Rosuvastatin   ? Other reaction(s): "severe cramps in muscles and tendons"  ? Liraglutide Other (See Comments)  ? Caused pancreatitis ?Other reaction(s): pancreas infection  ? Lisinopril Cough  ? ?  ? ?  ?Medication List  ?  ? ?  ? Accurate as of September 16, 2021  8:24 AM. If you have any questions, ask your nurse or doctor.  ?  ?  ? ?  ? ?aspirin 81 MG tablet ?  Take 81 mg by mouth daily. ?  ?carboxymethylcellulose 0.5 % Soln ?Commonly known as: REFRESH PLUS ?INSTILL 1 DROP IN BOTH EYES FOUR TIMES A DAY ?  ?clotrimazole-betamethasone cream ?Commonly known as: LOTRISONE ?Apply 1 application. topically 2 (two) times daily. X 10 days ?  ?cyclobenzaprine 10 MG tablet ?Commonly known as: FLEXERIL ?TAKE ONE-HALF TO ONE TABLET BY MOUTH DAILY AS NEEDED FOR MUSCLE SPASMS -AS DIRECTED ?  ?ezetimibe 10 MG tablet ?Commonly known as: ZETIA ?Take 1 tablet (10 mg total) by mouth daily. Please keep  upcoming appt in January 2023 with Cardiologist before anymore refills. Thank you ?  ?Fish Oil 1000 MG Caps ?Take by mouth. Takes 3 capsules a day ?  ?furosemide 20 MG tablet ?Commonly known as: LASIX ?Take 20 mg by mouth daily. Patient takes it as needed. 09/15/20 ?  ?insulin glargine-yfgn 100 UNIT/ML injection ?Commonly known as: SEMGLEE ?50 Units daily. ?  ?Jardiance 25 MG Tabs tablet ?Generic drug: empagliflozin ?Take 25 mg by mouth daily. ?  ?latanoprost 0.005 % ophthalmic solution ?Commonly known as: XALATAN ?INSTILL 1 DROP IN [facility] EYE AT BEDTIME ?  ?levothyroxine 25 MCG tablet ?Commonly known as: SYNTHROID ?Take 25 mcg by mouth daily before breakfast. ?  ?losartan 25 MG tablet ?Commonly known as: COZAAR ?Take 1 tablet (25 mg total) by mouth daily. ?  ?meloxicam 15 MG tablet ?Commonly known as: MOBIC ?One tab PO qAM with a meal for 2 weeks, then daily prn pain. ?  ?metFORMIN 750 MG 24 hr tablet ?Commonly known as: GLUCOPHAGE-XR ?TAKE 1 TABLET TWICE A DAY BEFORE MEALS ?  ?omeprazole 40 MG capsule ?Commonly known as: PRILOSEC ?Take 1 capsule (40 mg total) by mouth daily. ?  ?OneTouch Verio test strip ?Generic drug: glucose blood ?1 each by Other route as needed. ?  ?potassium chloride 10 MEQ CR capsule ?Commonly known as: MICRO-K ?TAKE ONE CAPSULE BY MOUTH DAILY AS NEEDED -TAKE WITH LASIX ?  ?propranolol ER 60 MG 24 hr capsule ?Commonly known as: INDERAL LA ?Take 1 capsule (60 mg total) by mouth daily. ?  ?rosuvastatin 5 MG tablet ?Commonly known as: CRESTOR ?Take 1 tablet by mouth daily. ?  ?tamsulosin 0.4 MG Caps capsule ?Commonly known as: FLOMAX ?Take 1 capsule (0.4 mg total) by mouth daily. ?  ?traZODone 50 MG tablet ?Commonly known as: DESYREL ?TAKE ONE-HALF (1/2) TO ONE TABLET AT BEDTIME AS NEEDED FOR SLEEP ?  ?Trulicity 1.5 0000000 Sopn ?Generic drug: Dulaglutide ?Inject 1.5 mg into the skin once a week. ?  ?vitamin B-12 500 MCG tablet ?Commonly known as: CYANOCOBALAMIN ?TAKE TWO TABLETS BY MOUTH ?   ?Vitamin D3 125 MCG (5000 UT) Caps ?Take 1 capsule by mouth daily. ?  ? ?  ? ? ?History (reviewed): ?Past Medical History:  ?Diagnosis Date  ? Allergy not sure  ? Arthritis   ? Chest pain   ? a. Normal cors 2001. b. Neg stress test 2012, 05/2014.  ? Diabetes mellitus without complication ([place])   ? GERD (gastroesophageal reflux disease)   ? Hyperlipidemia   ? Hypertension   ? Hypertensive response to exercise   ? Hypothyroidism   ? Obesity   ? Obstructive sleep apnea   ? Currently untreated   ? PONV (postoperative nausea and vomiting)   ? Sleep apnea   ? ?Past Surgical History:  ?Procedure Laterality Date  ? CARDIAC CATHETERIZATION    ? CERVICAL FUSION    ? HERNIA REPAIR    ? KNEE ARTHROSCOPY  1984  ?  both knee  ? SHOULDER ARTHROSCOPY WITH SUBACROMIAL DECOMPRESSION Right 09/13/2019  ? Procedure: RIGHT SHOULDER ARTHROSCOPY WITH EXTENSIVE DEBRIDEMENT, SUBACROMIAL DECOMPRESSION,;  Surgeon: Mcarthur Rossetti, MD;  Location: [facility];  Service: Orthopedics;  Laterality: Right;  ? SPINE SURGERY  1st Sept 1977  ? 2nd Feb 2011  ? ?Family History  ?Problem Relation Age of Onset  ? Diabetes Mother   ? Heart disease Mother   ? Hypertension Mother   ? Stroke Mother   ? Heart attack Mother   ? COPD Mother   ? Cancer Father   ? Heart disease Father   ? Diabetes Father   ? Hyperlipidemia Sister   ? Hypertension Sister   ? Diabetes Maternal Grandmother   ? Stroke Maternal Grandmother   ? Heart attack Maternal Grandmother   ? Heart disease Maternal Grandmother   ? Diabetes Maternal Grandfather   ? Hypertension Maternal Grandfather   ? Heart attack Maternal Grandfather   ? Diabetes Sister   ? Hypertension Sister   ? Cancer Sister   ? Heart attack Paternal Grandmother   ? Heart attack Paternal Grandfather   ? Cancer Sister   ? COPD Maternal Aunt   ? Diabetes Son   ? Diabetes Maternal Aunt   ? ?Social History  ? ?Socioeconomic History  ? Marital status: Married  ?  Spouse name: Mardene Celeste  ? Number of children: 2  ?  Years of education: 20  ? Highest education level: Bachelor's degree (e.g., BA, AB, BS)  ?Occupational History  ?  Comment: Retired  ?Tobacco Use  ? Smoking status: Former  ?  Packs/day: 1.50  ?  Years: 30.00

## 2021-09-16 NOTE — Patient Instructions (Addendum)
?MEDICARE ANNUAL WELLNESS VISIT ?Health Maintenance Summary and Written Plan of Care ? ?[name] , ? ?Thank you for allowing me to perform your Medicare Annual Wellness Visit and for your ongoing commitment to your health.  ? ?Health Maintenance & Immunization History ?Health Maintenance  ?Topic Date Due  ? COVID-19 Vaccine (4 - Booster) 10/02/2021 (Originally 10/26/2019)  ? INFLUENZA VACCINE  12/29/2021  ? HEMOGLOBIN A1C  01/18/2022  ? FOOT EXAM  06/23/2022  ? OPHTHALMOLOGY EXAM  07/01/2022  ? TETANUS/TDAP  07/30/2025  ? COLONOSCOPY (Pts 45-5yrs Insurance coverage will need to be confirmed)  02/23/2026  ? Pneumonia Vaccine 66+ Years old  Completed  ? Hepatitis C Screening  Completed  ? Zoster Vaccines- Shingrix  Completed  ? HPV VACCINES  Aged Out  ? ?Immunization History  ?Administered Date(s) Administered  ? Fluad Quad(high Dose 65+) 01/31/2019, 02/01/2020, 01/29/2021  ? Influenza Split 02/13/2012, 03/06/2014  ? Influenza, High Dose Seasonal PF 01/21/2017, 01/31/2019  ? Influenza,inj,Quad PF,6+ Mos 04/06/2011, 01/12/2018  ? Influenza-Unspecified 03/31/2010, 02/09/2012, 03/06/2014, 02/28/2015, 03/14/2016, 01/21/2017, 01/29/2017, 02/02/2019, 01/30/2020, 02/06/2020  ? PFIZER(Purple Top)SARS-COV-2 Vaccination 08/03/2019, 08/19/2019, 08/31/2019  ? Pneumococcal Conjugate-13 01/21/2017  ? Pneumococcal Polysaccharide-23 09/07/2000, 07/31/2015, 02/13/2020  ? Pneumococcal-Unspecified 08/29/2009  ? Td 04/10/2001  ? Tdap 07/07/2011, 01/29/2013, 07/31/2015  ? Zoster Recombinat (Shingrix) 10/29/2020, 05/13/2021  ? ? ?These are the patient goals that we discussed: ? Goals Addressed   ? ?  ?  ?  ?  ?  ? This Visit's Progress  ?   Patient Stated (pt-stated)     ?   09/16/2021 ?AWV Goal: Diabetes Management ? ?Patient will maintain an A1C level below 7.0 ?Patient will not develop any diabetic foot complications ?Patient will not experience any hypoglycemic episodes over the next 3 months ?Patient will notify our office of  any CBG readings outside of the provider recommended range by calling [phone] ?Patient will adhere to provider recommendations for diabetes management ? ?Patient Self Management Activities ?take all medications as prescribed and report any negative side effects ?monitor and record blood sugar readings as directed ?adhere to a low carbohydrate diet that incorporates lean proteins, vegetables, whole grains, low glycemic fruits ?check feet daily noting any sores, cracks, injuries, or callous formations ?see PCP or podiatrist if he notices any changes in his legs, feet, or toenails ?Patient will visit PCP and have an A1C level checked every 3 to 6 months as directed  ?have a yearly eye exam to monitor for vascular changes associated with diabetes and will request that the report be sent to his pcp.  ?consult with his PCP regarding any changes in his health or new or worsening symptoms  ?  ? ?  ?  ? ?This is a list of Health Maintenance Items that are overdue or due now: ?There are no preventive care reminders to display for this patient.  ? ?Orders/Referrals Placed Today: ?No orders of the defined types were placed in this encounter. ? ?(Contact our referral department at [phone] if you have not spoken with someone about your referral appointment within the next 5 days)  ? ? ?Follow-up Plan ?Follow-up with Gregory Nutting, DO as planned ?Please have your records from the eye doctor faxed to our office or you can bring them by the office. ?Medicare wellness visit in one year. ?Patient will access on mychart ? ? ? ?  ?Health Maintenance, Male ?Adopting a healthy lifestyle and getting preventive care are important in promoting health and wellness. Ask your health care  provider about: ?The right schedule for you to have regular tests and exams. ?Things you can do on your own to prevent diseases and keep yourself healthy. ?What should I know about diet, weight, and exercise? ?Eat a healthy diet ? ?Eat a diet that  includes plenty of vegetables, fruits, low-fat dairy products, and lean protein. ?Do not eat a lot of foods that are high in solid fats, added sugars, or sodium. ?Maintain a healthy weight ?Body mass index (BMI) is a measurement that can be used to identify possible weight problems. It estimates body fat based on height and weight. Your health care provider can help determine your BMI and help you achieve or maintain a healthy weight. ?Get regular exercise ?Get regular exercise. This is one of the most important things you can do for your health. Most adults should: ?Exercise for at least 150 minutes each week. The exercise should increase your heart rate and make you sweat (moderate-intensity exercise). ?Do strengthening exercises at least twice a week. This is in addition to the moderate-intensity exercise. ?Spend less time sitting. Even light physical activity can be beneficial. ?Watch cholesterol and blood lipids ?Have your blood tested for lipids and cholesterol at 70 years of age, then have this test every 5 years. ?You may need to have your cholesterol levels checked more often if: ?Your lipid or cholesterol levels are high. ?You are older than 70 years of age. ?You are at high risk for heart disease. ?What should I know about cancer screening? ?Many types of cancers can be detected early and may often be prevented. Depending on your health history and family history, you may need to have cancer screening at various ages. This may include screening for: ?Colorectal cancer. ?Prostate cancer. ?Skin cancer. ?Lung cancer. ?What should I know about heart disease, diabetes, and high blood pressure? ?Blood pressure and heart disease ?High blood pressure causes heart disease and increases the risk of stroke. This is more likely to develop in people who have high blood pressure readings or are overweight. ?Talk with your health care provider about your target blood pressure readings. ?Have your blood pressure  checked: ?Every 3-5 years if you are 22-67 years of age. ?Every year if you are 84 years old or older. ?If you are between the ages of 47 and 73 and are a current or former smoker, ask your health care provider if you should have a one-time screening for abdominal aortic aneurysm (AAA). ?Diabetes ?Have regular diabetes screenings. This checks your fasting blood sugar level. Have the screening done: ?Once every three years after age 2 if you are at a normal weight and have a low risk for diabetes. ?More often and at a younger age if you are overweight or have a high risk for diabetes. ?What should I know about preventing infection? ?Hepatitis B ?If you have a higher risk for hepatitis B, you should be screened for this virus. Talk with your health care provider to find out if you are at risk for hepatitis B infection. ?Hepatitis C ?Blood testing is recommended for: ?Everyone born from [birth year] through 1964. ?Anyone with known risk factors for hepatitis C. ?Sexually transmitted infections (STIs) ?You should be screened each year for STIs, including gonorrhea and chlamydia, if: ?You are sexually active and are younger than 70 years of age. ?You are older than 70 years of age and your health care provider tells you that you are at risk for this type of infection. ?Your sexual activity has changed since  you were last screened, and you are at increased risk for chlamydia or gonorrhea. Ask your health care provider if you are at risk. ?Ask your health care provider about whether you are at high risk for HIV. Your health care provider may recommend a prescription medicine to help prevent HIV infection. If you choose to take medicine to prevent HIV, you should first get tested for HIV. You should then be tested every 3 months for as long as you are taking the medicine. ?Follow these instructions at home: ?Alcohol use ?Do not drink alcohol if your health care provider tells you not to drink. ?If you drink alcohol: ?Limit how  much you have to 0-2 drinks a day. ?Know how much alcohol is in your drink. In the [place] one drink equals one 12 oz bottle of beer (355 mL), one 5 oz glass of wine (148 mL), or one 1? oz glass of hard liquor (44 mL)

## 2021-09-29 DIAGNOSIS — L84 Corns and callosities: Secondary | ICD-10-CM | POA: Diagnosis not present

## 2021-09-29 DIAGNOSIS — B351 Tinea unguium: Secondary | ICD-10-CM | POA: Diagnosis not present

## 2021-09-29 DIAGNOSIS — E1142 Type 2 diabetes mellitus with diabetic polyneuropathy: Secondary | ICD-10-CM | POA: Diagnosis not present

## 2021-10-30 ENCOUNTER — Ambulatory Visit (INDEPENDENT_AMBULATORY_CARE_PROVIDER_SITE_OTHER): Payer: Medicare Other

## 2021-10-30 ENCOUNTER — Ambulatory Visit (INDEPENDENT_AMBULATORY_CARE_PROVIDER_SITE_OTHER): Payer: Medicare Other | Admitting: Sports Medicine

## 2021-10-30 DIAGNOSIS — M79644 Pain in right finger(s): Secondary | ICD-10-CM

## 2021-10-30 DIAGNOSIS — M79641 Pain in right hand: Secondary | ICD-10-CM | POA: Insufficient documentation

## 2021-10-30 DIAGNOSIS — M65841 Other synovitis and tenosynovitis, right hand: Secondary | ICD-10-CM

## 2021-10-30 MED ORDER — CELECOXIB 200 MG PO CAPS: ORAL_CAPSULE | ORAL | 2 refills | Status: DC

## 2021-10-30 NOTE — Assessment & Plan Note (Signed)
Pleasant 71 year old male, he has a long history of right hand pain due, localizes volar third finger. He does have some triggering of the right third finger. On exam he has tenderness palpation flexor tendon sheath, palpable flexor tendon nodule. We will take a conservative approach, Celebrex, x-rays, home condition, return to see me in 4 weeks, flexor tendon sheath injection if not better.

## 2021-10-30 NOTE — Progress Notes (Signed)
    Procedures performed today:    None.  Independent interpretation of notes and tests performed by another provider:   None.  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 He does have some triggering of the right third finger. On exam he has tenderness palpation flexor tendon sheath, palpable flexor tendon nodule. We will take a conservative approach, Celebrex, x-rays, home condition, return to see me in 4 weeks, flexor tendon sheath injection if not better.  Chronic process with exacerbation and pharmacologic intervention  ___________________________________________ Ihor Austin. Benjamin Stain, M.D., ABFM., CAQSM. Primary Care and Sports Medicine [facility]  Adjunct Instructor of Family Medicine  [employer]

## 2021-11-11 DIAGNOSIS — Z794 Long term (current) use of insulin: Secondary | ICD-10-CM | POA: Diagnosis not present

## 2021-11-11 DIAGNOSIS — I10 Essential (primary) hypertension: Secondary | ICD-10-CM | POA: Diagnosis not present

## 2021-11-11 DIAGNOSIS — E1165 Type 2 diabetes mellitus with hyperglycemia: Secondary | ICD-10-CM | POA: Diagnosis not present

## 2021-11-11 DIAGNOSIS — E782 Mixed hyperlipidemia: Secondary | ICD-10-CM | POA: Diagnosis not present

## 2021-11-11 DIAGNOSIS — E230 Hypopituitarism: Secondary | ICD-10-CM | POA: Diagnosis not present

## 2021-11-11 LAB — HEMOGLOBIN A1C: Hemoglobin A1C: 7

## 2021-11-11 LAB — PROTEIN / CREATININE RATIO, URINE: Albumin, U: <7

## 2021-11-13 DIAGNOSIS — Z6839 Body mass index (BMI) 39.0-39.9, adult: Secondary | ICD-10-CM | POA: Diagnosis not present

## 2021-11-13 DIAGNOSIS — E1142 Type 2 diabetes mellitus with diabetic polyneuropathy: Secondary | ICD-10-CM | POA: Diagnosis not present

## 2021-11-13 DIAGNOSIS — E1165 Type 2 diabetes mellitus with hyperglycemia: Secondary | ICD-10-CM | POA: Diagnosis not present

## 2021-11-13 DIAGNOSIS — Z7985 Long-term (current) use of injectable non-insulin antidiabetic drugs: Secondary | ICD-10-CM | POA: Diagnosis not present

## 2021-11-13 DIAGNOSIS — Z7984 Long term (current) use of oral hypoglycemic drugs: Secondary | ICD-10-CM | POA: Diagnosis not present

## 2021-11-13 DIAGNOSIS — Z794 Long term (current) use of insulin: Secondary | ICD-10-CM | POA: Diagnosis not present

## 2021-11-13 DIAGNOSIS — E669 Obesity, unspecified: Secondary | ICD-10-CM | POA: Diagnosis not present

## 2021-11-13 DIAGNOSIS — E785 Hyperlipidemia, unspecified: Secondary | ICD-10-CM | POA: Diagnosis not present

## 2021-11-13 DIAGNOSIS — I10 Essential (primary) hypertension: Secondary | ICD-10-CM | POA: Diagnosis not present

## 2021-11-30 ENCOUNTER — Ambulatory Visit: Payer: Medicare Other | Admitting: Sports Medicine

## 2021-12-02 ENCOUNTER — Ambulatory Visit (INDEPENDENT_AMBULATORY_CARE_PROVIDER_SITE_OTHER): Payer: Medicare Other | Admitting: Sports Medicine

## 2021-12-02 ENCOUNTER — Ambulatory Visit (INDEPENDENT_AMBULATORY_CARE_PROVIDER_SITE_OTHER): Payer: Medicare Other

## 2021-12-02 DIAGNOSIS — M79642 Pain in left hand: Secondary | ICD-10-CM

## 2021-12-02 DIAGNOSIS — M65841 Other synovitis and tenosynovitis, right hand: Secondary | ICD-10-CM

## 2021-12-02 DIAGNOSIS — M79641 Pain in right hand: Secondary | ICD-10-CM | POA: Diagnosis not present

## 2021-12-02 NOTE — Assessment & Plan Note (Addendum)
Gregory West returns, he is a pleasant 71 year old male, bilateral hand pain localized at the third flexor tendon sheath as well as the carpometacarpal joint. He also has pain left second MCP Similar symptoms on the left side. Today we did a left first CMC and third flexor tendon sheath injection. He will return in about 4 to 6 weeks and consider doing the left side.

## 2021-12-02 NOTE — Progress Notes (Signed)
    Procedures performed today:    Procedure: Real-time Ultrasound Guided injection of the right third flexor tendon sheath Device: Samsung HS60  Verbal informed consent obtained.  Time-out conducted.  Noted no overlying erythema, induration, or other signs of local infection.  Skin prepped in a sterile fashion.  Local anesthesia: Topical Ethyl chloride.  With sterile technique and under real time ultrasound guidance: Noted flexor tendon nodule, 1/2 cc lidocaine, 1/2 cc kenalog 40 injected easily. Completed without difficulty  Advised to call if fevers/chills, erythema, induration, drainage, or persistent bleeding.  Images permanently stored and available for review in PACS.  Impression: Technically successful ultrasound guided injection.  Procedure: Real-time Ultrasound Guided injection of the right first carpometacarpal joint. Device: Samsung HS60  Verbal informed consent obtained.  Time-out conducted.  Noted no overlying erythema, induration, or other signs of local infection.  Skin prepped in a sterile fashion.  Local anesthesia: Topical Ethyl chloride.  With sterile technique and under real time ultrasound guidance: Noted arthritic joint, 1/2 cc lidocaine, 1/2 cc kenalog 40 injected easily. Completed without difficulty  Advised to call if fevers/chills, erythema, induration, drainage, or persistent bleeding.  Images permanently stored and available for review in PACS.  Impression: Technically successful ultrasound guided injection.  Independent interpretation of notes and tests performed by another provider:   None.  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 Gregory West returns, he is a pleasant 71 year old male, bilateral hand pain localized at the third flexor tendon sheath as well as the carpometacarpal joint. He also has pain left second MCP Similar symptoms on the left  side. Today we did a left first CMC and third flexor tendon sheath injection. He will return in about 4 to 6 weeks and consider doing the left side.  Chronic process with exacerbation and pharmacologic intervention  ____________________________________________ Gregory West. Gregory West, M.D., ABFM., CAQSM., AME. Primary Care and Sports Medicine [facility]  Adjunct Professor of Family Medicine  [place] [employer]  Restaurant manager, fast food

## 2021-12-31 DIAGNOSIS — B351 Tinea unguium: Secondary | ICD-10-CM | POA: Diagnosis not present

## 2021-12-31 DIAGNOSIS — L84 Corns and callosities: Secondary | ICD-10-CM | POA: Diagnosis not present

## 2021-12-31 DIAGNOSIS — E1142 Type 2 diabetes mellitus with diabetic polyneuropathy: Secondary | ICD-10-CM | POA: Diagnosis not present

## 2022-01-13 ENCOUNTER — Ambulatory Visit (INDEPENDENT_AMBULATORY_CARE_PROVIDER_SITE_OTHER): Payer: Medicare Other | Admitting: Sports Medicine

## 2022-01-13 DIAGNOSIS — M25511 Pain in right shoulder: Secondary | ICD-10-CM | POA: Diagnosis not present

## 2022-01-13 DIAGNOSIS — G8929 Other chronic pain: Secondary | ICD-10-CM | POA: Diagnosis not present

## 2022-01-13 DIAGNOSIS — M25512 Pain in left shoulder: Secondary | ICD-10-CM

## 2022-01-13 DIAGNOSIS — M79642 Pain in left hand: Secondary | ICD-10-CM

## 2022-01-13 DIAGNOSIS — M79641 Pain in right hand: Secondary | ICD-10-CM | POA: Diagnosis not present

## 2022-01-13 MED ORDER — ACETAMINOPHEN ER 650 MG PO TBCR: 650.0000 mg | EXTENDED_RELEASE_TABLET | Freq: Three times a day (TID) | ORAL | 3 refills | Status: DC | PRN

## 2022-01-13 NOTE — Assessment & Plan Note (Signed)
At the last visit I injected Richards right third flexor tendon sheath and right first CMC. He still has occasional popping but no catching, locking, no pain with regards to his trigger finger, and the right first [facility] is doing well. Left side has a bit of discomfort, he also has some second and third PIP osteoarthritis. I have suggested more lifestyle and dietary changes to start out with, Mediterranean type diet, low-sodium, arthritis from Tylenol. He does need to lose a lot of weight. I think improving his lifestyle from a metabolic standpoint will help his joints dramatically.

## 2022-01-13 NOTE — Assessment & Plan Note (Signed)
Bilateral shoulder pain, he has a history of arthroscopic debridement on the right with Dr. 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 He has impingement signs on exam today. Adding rotator cuff conditioning exercises, we will do a subacromial injection if not better in 6 weeks.

## 2022-01-13 NOTE — Progress Notes (Signed)
    Procedures performed today:    None.  Independent interpretation of notes and tests performed by another provider:   None.  Brief History, Exam, Impression, and Recommendations:    Bilateral shoulder pain Bilateral shoulder pain, he has a history of arthroscopic debridement on the right with Dr. 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 He has impingement signs on exam today. Adding rotator cuff conditioning exercises, we will do a subacromial injection if not better in 6 weeks.  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 At the last visit I injected Richards right third flexor tendon sheath and right first CMC. He still has occasional popping but no catching, locking, no pain with regards to his trigger finger, and the right first [facility] is doing well. Left side has a bit of discomfort, he also has some second and third PIP osteoarthritis. I have suggested more lifestyle and dietary changes to start out with, Mediterranean type diet, low-sodium, arthritis from Tylenol. He does need to lose a lot of weight. I think improving his lifestyle from a metabolic standpoint will help his joints dramatically.    ____________________________________________ Gregory West. Benjamin Stain, M.D., ABFM., CAQSM., AME. Primary Care and Sports Medicine [place] [facility]  Adjunct Professor of Family Medicine  [place] [employer]  Restaurant manager, fast food

## 2022-01-22 ENCOUNTER — Ambulatory Visit (INDEPENDENT_AMBULATORY_CARE_PROVIDER_SITE_OTHER): Payer: Medicare Other | Admitting: Sports Medicine

## 2022-01-22 ENCOUNTER — Ambulatory Visit (INDEPENDENT_AMBULATORY_CARE_PROVIDER_SITE_OTHER): Payer: Medicare Other

## 2022-01-22 DIAGNOSIS — M7542 Impingement syndrome of left shoulder: Secondary | ICD-10-CM

## 2022-01-22 DIAGNOSIS — M7541 Impingement syndrome of right shoulder: Secondary | ICD-10-CM | POA: Diagnosis not present

## 2022-01-22 NOTE — Progress Notes (Signed)
    Procedures performed today:    Procedure: Real-time Ultrasound Guided injection of the left subacromial bursa Device: Samsung HS60  Verbal informed consent obtained.  Time-out conducted.  Noted no overlying erythema, induration, or other signs of local infection.  Skin prepped in a sterile fashion.  Local anesthesia: Topical Ethyl chloride.  With sterile technique and under real time ultrasound guidance: Noted intact cuff with calcific tendinopathy as well as partial-thickness articular sided tearing of the supraspinatus, 1 cc Kenalog 40, 1 cc lidocaine, 1 cc bupivacaine injected easily Completed without difficulty  Advised to call if fevers/chills, erythema, induration, drainage, or persistent bleeding.  Images permanently stored and available for review in PACS.  Impression: Technically successful ultrasound guided injection.  Independent interpretation of notes and tests performed by another provider:   None.  Brief History, Exam, Impression, and Recommendations:    Impingement syndrome of both shoulders Very pleasant 71 year old male, chronic bilateral shoulder pain, he does have a history of an arthroscopic debridement on the right with Dr. Magnus Ivan, he has worsening impingement symptoms on the left, MRI in the chart did show cuff tendinosis and calcific tendinopathy. He has done several weeks of cuff conditioning but unfortunately the pain is severe so we will do a subacromial injection today in the hopes of facilitating working harder with the cuff conditioning for at least 6 weeks. Return to see me in 6 weeks.  Chronic process with exacerbation and pharmacologic intervention  ____________________________________________ Gregory West. Benjamin Stain, M.D., ABFM., CAQSM., AME. Primary Care and Sports Medicine [facility]  Adjunct Professor of Family Medicine  [place] [employer] [facility] [employer]  Restaurant manager, fast food

## 2022-01-22 NOTE — Assessment & Plan Note (Signed)
Very pleasant 71 year old male, chronic bilateral shoulder pain, he does have a history of an arthroscopic debridement on the right with Dr. Magnus Ivan, he has worsening impingement symptoms on the left, MRI in the chart did show cuff tendinosis and calcific tendinopathy. He has done several weeks of cuff conditioning but unfortunately the pain is severe so we will do a subacromial injection today in the hopes of facilitating working harder with the cuff conditioning for at least 6 weeks. Return to see me in 6 weeks.

## 2022-01-29 ENCOUNTER — Ambulatory Visit (INDEPENDENT_AMBULATORY_CARE_PROVIDER_SITE_OTHER): Payer: Medicare Other | Admitting: Family Medicine

## 2022-01-29 ENCOUNTER — Encounter: Payer: Self-pay | Admitting: Family Medicine

## 2022-01-29 VITALS — BP 120/77 | HR 69 | Ht 69.5 in | Wt 270.0 lb

## 2022-01-29 DIAGNOSIS — N401 Enlarged prostate with lower urinary tract symptoms: Secondary | ICD-10-CM

## 2022-01-29 DIAGNOSIS — E039 Hypothyroidism, unspecified: Secondary | ICD-10-CM

## 2022-01-29 DIAGNOSIS — I152 Hypertension secondary to endocrine disorders: Secondary | ICD-10-CM

## 2022-01-29 DIAGNOSIS — S83282A Other tear of lateral meniscus, current injury, left knee, initial encounter: Secondary | ICD-10-CM | POA: Insufficient documentation

## 2022-01-29 DIAGNOSIS — G250 Essential tremor: Secondary | ICD-10-CM | POA: Diagnosis not present

## 2022-01-29 DIAGNOSIS — R350 Frequency of micturition: Secondary | ICD-10-CM | POA: Diagnosis not present

## 2022-01-29 DIAGNOSIS — E1149 Type 2 diabetes mellitus with other diabetic neurological complication: Secondary | ICD-10-CM

## 2022-01-29 DIAGNOSIS — E1159 Type 2 diabetes mellitus with other circulatory complications: Secondary | ICD-10-CM | POA: Diagnosis not present

## 2022-01-29 DIAGNOSIS — I7143 Infrarenal abdominal aortic aneurysm, without rupture: Secondary | ICD-10-CM | POA: Diagnosis not present

## 2022-01-29 HISTORY — PX: KNEE ARTHROSCOPY: SUR90

## 2022-01-29 NOTE — Progress Notes (Unsigned)
Gregory Ready [name] - 71 y.o. male MRN [id number]  Date of birth: February 21, 1951  Subjective Chief Complaint  Patient presents with   Pre-op Exam    HPI Gregory Ready [name]  Is a 71 y.o. male here today for follow up today.    Reports that he is doing fairly well today.  Continues to see endocrinology for management of diabetes.  A1c on 11/11/21 was 7.0%.    Continues on losartan and propranolol for management of HTN. BP is well controlled.  Denies side effects related to medications.  Denies chest pain, shortness of breath, palpitations ,headache or vision changes.  Noted to have 3cm AAA in 2019.  Has upcoming [place] for f/u of this. Denies symptoms at this time.   Continues on crestor for management of HLD.  Tolerating well.  Last LDL was 108.    BPH symptoms are stable with tamsulosin.    Currently seeing Dr. Benjamin Stain for shoulder pain.  Stable at this time.   ROS:  A comprehensive ROS was completed and negative except as noted per HPI  Allergies  Allergen Reactions   Statins     Muscle cramping all over body. Lipitor, Crestor, and pravastatin.   Atorvastatin     Other reaction(s): "severe cramps in muscles and tendons"   Rosuvastatin     Other reaction(s): "severe cramps in muscles and tendons"   Liraglutide Other (See Comments)    Caused pancreatitis Other reaction(s): pancreas infection   Lisinopril Cough    Past Medical History:  Diagnosis Date   Allergy not sure   Arthritis    Chest pain    a. Normal cors 2001. b. Neg stress test 2012, 05/2014.   Diabetes mellitus without complication (HCC)    GERD (gastroesophageal reflux disease)    Hyperlipidemia    Hypertension    Hypertensive response to exercise    Hypothyroidism    Obesity    Obstructive sleep apnea    Currently untreated    PONV (postoperative nausea and vomiting)    Sleep apnea     Past Surgical History:  Procedure Laterality Date   CARDIAC CATHETERIZATION     CERVICAL FUSION      HERNIA REPAIR     KNEE ARTHROSCOPY  1984   both knee   SHOULDER ARTHROSCOPY WITH SUBACROMIAL DECOMPRESSION Right 09/13/2019   Procedure: RIGHT SHOULDER ARTHROSCOPY WITH EXTENSIVE DEBRIDEMENT, SUBACROMIAL DECOMPRESSION,;  Surgeon: Kathryne Hitch, MD;  Location: [facility];  Service: Orthopedics;  Laterality: Right;   SPINE SURGERY  1st Sept 1977   2nd Feb 2011    Social History   Socioeconomic History   Marital status: Married    Spouse name: [name]   Number of children: 2   Years of education: 16   Highest education level: Bachelor's degree (e.g., BA, AB, BS)  Occupational History    Comment: Retired  Tobacco Use   Smoking status: Former    Packs/day: 1.50    Years: 30.00    Total pack years: 45.00    Types: Cigarettes    Quit date: 1995    Years since quitting: 28.6   Smokeless tobacco: Never  Vaping Use   Vaping Use: Never used  Substance and Sexual Activity   Alcohol use: No   Drug use: No   Sexual activity: Yes    Birth control/protection: None    Comment: Not Necessary  Other Topics Concern   Not on file  Social History Narrative   Lives with  his wife. He is looking for a job. He likes to read his bible and go fishing in his free time.    Social Determinants of Health   Financial Resource Strain: Low Risk  (09/13/2021)   Overall Financial Resource Strain (CARDIA)    Difficulty of Paying Living Expenses: Not very hard  Food Insecurity: No Food Insecurity (09/13/2021)   Hunger Vital Sign    Worried About Running Out of Food in the Last Year: Never true    Ran Out of Food in the Last Year: Never true  Transportation Needs: No Transportation Needs (09/13/2021)   PRAPARE - Administrator, Civil Service (Medical): No    Lack of Transportation (Non-Medical): No  Physical Activity: Sufficiently Active (09/13/2021)   Exercise Vital Sign    Days of Exercise per Week: 3 days    Minutes of Exercise per Session: 60 min  Stress: No  Stress Concern Present (09/13/2021)   [employer] of Occupational Health - Occupational Stress Questionnaire    Feeling of Stress : Only a little  Social Connections: Socially Integrated (09/16/2021)   Social Connection and Isolation Panel [NHANES]    Frequency of Communication with Friends and Family: Twice a week    Frequency of Social Gatherings with Friends and Family: Twice a week    Attends Religious Services: More than 4 times per year    Active Member of Clubs or Organizations: Yes    Attends Engineer, structural: More than 4 times per year    Marital Status: Married    Family History  Problem Relation Age of Onset   Diabetes Mother    Heart disease Mother    Hypertension Mother    Stroke Mother    Heart attack Mother    COPD Mother    Cancer Father    Heart disease Father    Diabetes Father    Hyperlipidemia Sister    Hypertension Sister    Diabetes Maternal Grandmother    Stroke Maternal Grandmother    Heart attack Maternal Grandmother    Heart disease Maternal Grandmother    Diabetes Maternal Grandfather    Hypertension Maternal Grandfather    Heart attack Maternal Grandfather    Diabetes Sister    Hypertension Sister    Cancer Sister    Heart attack Paternal Grandmother    Heart attack Paternal Grandfather    Cancer Sister    COPD Maternal Aunt    Diabetes Son    Diabetes Maternal Aunt     Health Maintenance  Topic Date Due   Diabetic kidney evaluation - GFR measurement  01/29/2022   COVID-19 Vaccine (4 - Pfizer risk series) 07/01/2022 (Originally 10/26/2019)   INFLUENZA VACCINE  08/29/2022 (Originally 12/29/2021)   HEMOGLOBIN A1C  05/13/2022   FOOT EXAM  06/23/2022   OPHTHALMOLOGY EXAM  07/01/2022   Diabetic kidney evaluation - Urine ACR  11/12/2022   TETANUS/TDAP  07/30/2025   COLONOSCOPY (Pts 45-77yrs Insurance coverage will need to be confirmed)  02/23/2026   Pneumonia Vaccine 54+ Years old  Completed   Hepatitis C Screening  Completed    Zoster Vaccines- Shingrix  Completed   HPV VACCINES  Aged Out     ----------------------------------------------------------------------------------------------------------------------------------------------------------------------------------------------------------------- Physical Exam BP 120/77 (BP Location: Right Arm, Patient Position: Sitting, Cuff Size: Large)   Pulse 69   Ht 5' 9.5" (1.765 m)   Wt 270 lb (122.5 kg)   SpO2 97%   BMI 39.30 kg/m   Physical Exam Constitutional:  Appearance: Normal appearance.  Eyes:     General: No scleral icterus. Cardiovascular:     Rate and Rhythm: Normal rate and regular rhythm.  Pulmonary:     Effort: Pulmonary effort is normal.     Breath sounds: Normal breath sounds.  Musculoskeletal:     Cervical back: Neck supple.  Neurological:     General: No focal deficit present.     Mental Status: He is alert.  Psychiatric:        Mood and Affect: Mood normal.        Behavior: Behavior normal.     ------------------------------------------------------------------------------------------------------------------------------------------------------------------------------------------------------------------- Assessment and Plan  Type 2 diabetes mellitus with neurological complications (HCC) Management per endocrinology.  DoIng well at this time.    Hypertension associated with diabetes (HCC) BP remains very well controlled at this time.  Recommend continuation of current medications.    Abdominal aortic aneurysm [facility]) Has upcoming repeat US for AAA.   Benign essential tremor Continue propranolol.    BPH (benign prostatic hyperplasia) Doing well with flomax. Update PSA.    No orders of the defined types were placed in this encounter.   No follow-ups on file.    This visit occurred during the SARS-CoV-2 public health emergency.  Safety protocols were in place, including screening questions prior to the visit,  additional usage of staff PPE, and extensive cleaning of exam room while observing appropriate contact time as indicated for disinfecting solutions.

## 2022-01-29 NOTE — Assessment & Plan Note (Signed)
BP remains very well controlled at this time.  Recommend continuation of current medications.

## 2022-01-29 NOTE — Assessment & Plan Note (Signed)
Doing well with flomax. Update PSA.

## 2022-01-29 NOTE — Assessment & Plan Note (Signed)
Has upcoming repeat US for AAA.

## 2022-01-29 NOTE — Assessment & Plan Note (Signed)
Management per endocrinology.  DoIng well at this time.

## 2022-01-29 NOTE — Assessment & Plan Note (Signed)
-  Continue propranolol 

## 2022-01-30 LAB — BASIC METABOLIC PANEL
BUN: 13 mg/dL (ref 7–25)
CO2: 30 mmol/L (ref 20–32)
Calcium: 8.9 mg/dL (ref 8.6–10.3)
Chloride: 100 mmol/L (ref 98–110)
Creat: 0.86 mg/dL (ref 0.70–1.28)
Glucose, Bld: 119 mg/dL — ABNORMAL HIGH (ref 65–99)
Potassium: 4.4 mmol/L (ref 3.5–5.3)
Sodium: 138 mmol/L (ref 135–146)

## 2022-01-30 LAB — LIPID PANEL W/REFLEX DIRECT LDL
Cholesterol: 179 mg/dL (ref <200)
HDL: 45 mg/dL (ref >=40)
LDL Cholesterol (Calc): 116 mg/dL (calc) — ABNORMAL HIGH
Non-HDL Cholesterol (Calc): 134 mg/dL (calc) — ABNORMAL HIGH (ref <130)
Total CHOL/HDL Ratio: 4 (calc) (ref <5.0)
Triglycerides: 85 mg/dL (ref <150)

## 2022-01-30 LAB — PSA: PSA: 1.39 ng/mL (ref <=4.00)

## 2022-01-30 LAB — TSH: TSH: 6.39 mIU/L — ABNORMAL HIGH (ref 0.40–4.50)

## 2022-02-19 ENCOUNTER — Ambulatory Visit: Payer: Medicare Other | Admitting: Sports Medicine

## 2022-03-05 ENCOUNTER — Other Ambulatory Visit: Payer: Self-pay | Admitting: Family Medicine

## 2022-03-05 ENCOUNTER — Encounter: Payer: Self-pay | Admitting: Sports Medicine

## 2022-03-05 ENCOUNTER — Ambulatory Visit (INDEPENDENT_AMBULATORY_CARE_PROVIDER_SITE_OTHER): Payer: Medicare Other | Admitting: Sports Medicine

## 2022-03-05 VITALS — Ht 69.5 in | Wt 263.0 lb

## 2022-03-05 DIAGNOSIS — Z23 Encounter for immunization: Secondary | ICD-10-CM

## 2022-03-05 DIAGNOSIS — M7541 Impingement syndrome of right shoulder: Secondary | ICD-10-CM

## 2022-03-05 DIAGNOSIS — M7542 Impingement syndrome of left shoulder: Secondary | ICD-10-CM | POA: Diagnosis not present

## 2022-03-05 NOTE — Assessment & Plan Note (Signed)
Returns, he is a very pleasant 71 year old male, chronic bilateral shoulder pain status post arthroscopic debridement with Dr. Ninfa Linden. He had worsening impingement signs bilaterally, MRIs in the chart did show cuff tendinosis and calcific tendinopathy. He had severe pain so at the last visit we did a subacromial injection of the left side, continues to have discomfort. He has been doing home physical therapy. I do think we need him to touch base again with Dr. Ninfa Linden. He can return to see me on an as-needed basis.

## 2022-03-05 NOTE — Progress Notes (Signed)
    Procedures performed today:    None.  Independent interpretation of notes and tests performed by another provider:   None.  Brief History, Exam, Impression, and Recommendations:    Impingement syndrome of both shoulders Returns, he is a very pleasant 71 year old male, chronic bilateral shoulder pain status post arthroscopic debridement with Dr. Ninfa Linden. He had worsening impingement signs bilaterally, MRIs in the chart did show cuff tendinosis and calcific tendinopathy. He had severe pain so at the last visit we did a subacromial injection of the left side, continues to have discomfort. He has been doing home physical therapy. I do think we need him to touch base again with Dr. Ninfa Linden. He can return to see me on an as-needed basis.    ____________________________________________ Gwen Her. Dianah Field, M.D., ABFM., CAQSM., AME. Primary Care and Sports Medicine [facility]  Adjunct Professor of [place] [employer] [facility] of Medicine  Risk manager

## 2022-03-08 DIAGNOSIS — H409 Unspecified glaucoma: Secondary | ICD-10-CM | POA: Diagnosis not present

## 2022-03-08 DIAGNOSIS — E230 Hypopituitarism: Secondary | ICD-10-CM | POA: Diagnosis not present

## 2022-03-08 DIAGNOSIS — E1159 Type 2 diabetes mellitus with other circulatory complications: Secondary | ICD-10-CM | POA: Diagnosis not present

## 2022-03-08 DIAGNOSIS — E119 Type 2 diabetes mellitus without complications: Secondary | ICD-10-CM | POA: Diagnosis not present

## 2022-03-08 DIAGNOSIS — E1165 Type 2 diabetes mellitus with hyperglycemia: Secondary | ICD-10-CM | POA: Diagnosis not present

## 2022-03-08 DIAGNOSIS — I152 Hypertension secondary to endocrine disorders: Secondary | ICD-10-CM | POA: Diagnosis not present

## 2022-03-08 DIAGNOSIS — E1142 Type 2 diabetes mellitus with diabetic polyneuropathy: Secondary | ICD-10-CM | POA: Diagnosis not present

## 2022-03-08 DIAGNOSIS — E559 Vitamin D deficiency, unspecified: Secondary | ICD-10-CM | POA: Diagnosis not present

## 2022-03-08 DIAGNOSIS — E1169 Type 2 diabetes mellitus with other specified complication: Secondary | ICD-10-CM | POA: Diagnosis not present

## 2022-03-08 DIAGNOSIS — E669 Obesity, unspecified: Secondary | ICD-10-CM | POA: Diagnosis not present

## 2022-03-08 DIAGNOSIS — Z794 Long term (current) use of insulin: Secondary | ICD-10-CM | POA: Diagnosis not present

## 2022-03-08 DIAGNOSIS — Z978 Presence of other specified devices: Secondary | ICD-10-CM | POA: Diagnosis not present

## 2022-03-12 DIAGNOSIS — E669 Obesity, unspecified: Secondary | ICD-10-CM | POA: Insufficient documentation

## 2022-05-03 DIAGNOSIS — E1142 Type 2 diabetes mellitus with diabetic polyneuropathy: Secondary | ICD-10-CM | POA: Diagnosis not present

## 2022-05-03 DIAGNOSIS — B351 Tinea unguium: Secondary | ICD-10-CM | POA: Diagnosis not present

## 2022-05-03 DIAGNOSIS — L84 Corns and callosities: Secondary | ICD-10-CM | POA: Diagnosis not present

## 2022-06-02 ENCOUNTER — Ambulatory Visit: Payer: Medicare Other

## 2022-06-02 ENCOUNTER — Encounter: Payer: Self-pay | Admitting: Orthopaedic Surgery

## 2022-06-02 ENCOUNTER — Ambulatory Visit (INDEPENDENT_AMBULATORY_CARE_PROVIDER_SITE_OTHER): Payer: Medicare Other

## 2022-06-02 ENCOUNTER — Ambulatory Visit (INDEPENDENT_AMBULATORY_CARE_PROVIDER_SITE_OTHER): Payer: Medicare Other | Admitting: Orthopaedic Surgery

## 2022-06-02 DIAGNOSIS — M25511 Pain in right shoulder: Secondary | ICD-10-CM

## 2022-06-02 DIAGNOSIS — M7542 Impingement syndrome of left shoulder: Secondary | ICD-10-CM

## 2022-06-02 DIAGNOSIS — M25512 Pain in left shoulder: Secondary | ICD-10-CM | POA: Diagnosis not present

## 2022-06-02 DIAGNOSIS — M7541 Impingement syndrome of right shoulder: Secondary | ICD-10-CM | POA: Diagnosis not present

## 2022-06-02 DIAGNOSIS — G8929 Other chronic pain: Secondary | ICD-10-CM

## 2022-06-02 MED ORDER — LIDOCAINE HCL 1 % IJ SOLN
3.0000 mL | INTRAMUSCULAR | Status: AC | PRN
  Administered 2022-06-02: 3 mL

## 2022-06-02 MED ORDER — METHYLPREDNISOLONE ACETATE 40 MG/ML IJ SUSP
40.0000 mg | INTRAMUSCULAR | Status: AC | PRN
  Administered 2022-06-02: 40 mg via INTRA_ARTICULAR

## 2022-06-02 NOTE — Progress Notes (Signed)
The patient is somewhat of seen in the past and actually performed arthroscopic surgery on the right shoulder for impingement syndrome back in April 2021.  He is a Company secretary and retired Psychologist, clinical.  He is 72 years old.  He has been getting physical therapy for both shoulders and his left shoulder is hurting him much more significantly since the summer.  MRIs in 2021 of both shoulders showed the left shoulder had just some mild tendinitis and tendinopathy with no tears.  The right shoulder was more significant in terms of partial tearing of the rotator cuff and that shoulder did well for a while after surgery.  There was moderate glenohumeral arthritis.  He does get some popping in his right shoulder but pain with overhead activities of both shoulders.  He is a diabetic and reports his hemoglobin A1c most recently was 6.6.  It has been higher in the past.  Steroid injections have elevated his blood glucose as well.  On exam both shoulder he does show signs of pain and impingement worse on the left than the right.  He is using his rotator cuffs appropriately.  He does have pain at the [facility] joint and positive crossarm pain bilaterally.  3 views of both shoulders show no acute findings but arthritic changes at both [facility] joints and chronic changes around the greater tuberosity of both shoulders.  Both shoulders are well located and the axillary views does not show significant narrowing.  I did recommend a steroid injection in his left shoulder subacromial aloe which she agreed to and tolerated well.  He understands that he will watch his blood glucose closely.  Will see him back in 6 weeks to see how he is doing overall with the left shoulder and can consider an injection in the right shoulder if warranted.     Procedure Note  Patient: Gregory GRAUBERGER [name]             Date of Birth: 1950/09/10           MRN: [id number]             Visit Date: 06/02/2022  Procedures: Visit Diagnoses:  1. Chronic left shoulder  pain   2. Chronic right shoulder pain   3. Impingement syndrome of left shoulder   4. Impingement syndrome of right shoulder     Large Joint Inj: L subacromial bursa on 06/02/2022 9:51 AM Indications: pain and diagnostic evaluation Details: 22 G 1.5 in needle  Arthrogram: No  Medications: 3 mL lidocaine 1 %; 40 mg methylPREDNISolone acetate 40 MG/ML Outcome: tolerated well, no immediate complications Procedure, treatment alternatives, risks and benefits explained, specific risks discussed. Consent was given by the patient. Immediately prior to procedure a time out was called to verify the correct patient, procedure, equipment, support staff and site/side marked as required. Patient was prepped and draped in the usual sterile fashion.

## 2022-06-11 LAB — HM DIABETES EYE EXAM

## 2022-06-15 ENCOUNTER — Encounter: Payer: Self-pay | Admitting: Interventional Cardiology

## 2022-06-22 DIAGNOSIS — Z6839 Body mass index (BMI) 39.0-39.9, adult: Secondary | ICD-10-CM | POA: Diagnosis not present

## 2022-06-22 DIAGNOSIS — E669 Obesity, unspecified: Secondary | ICD-10-CM | POA: Diagnosis not present

## 2022-06-22 DIAGNOSIS — E1142 Type 2 diabetes mellitus with diabetic polyneuropathy: Secondary | ICD-10-CM | POA: Diagnosis not present

## 2022-06-22 DIAGNOSIS — E785 Hyperlipidemia, unspecified: Secondary | ICD-10-CM | POA: Diagnosis not present

## 2022-06-22 DIAGNOSIS — R110 Nausea: Secondary | ICD-10-CM | POA: Diagnosis not present

## 2022-06-22 DIAGNOSIS — Z7985 Long-term (current) use of injectable non-insulin antidiabetic drugs: Secondary | ICD-10-CM | POA: Diagnosis not present

## 2022-06-22 DIAGNOSIS — E1169 Type 2 diabetes mellitus with other specified complication: Secondary | ICD-10-CM | POA: Diagnosis not present

## 2022-06-22 DIAGNOSIS — Z794 Long term (current) use of insulin: Secondary | ICD-10-CM | POA: Diagnosis not present

## 2022-06-22 DIAGNOSIS — H04129 Dry eye syndrome of unspecified lacrimal gland: Secondary | ICD-10-CM | POA: Diagnosis not present

## 2022-06-22 DIAGNOSIS — E1165 Type 2 diabetes mellitus with hyperglycemia: Secondary | ICD-10-CM | POA: Diagnosis not present

## 2022-06-22 DIAGNOSIS — Z7984 Long term (current) use of oral hypoglycemic drugs: Secondary | ICD-10-CM | POA: Diagnosis not present

## 2022-06-22 DIAGNOSIS — Z79899 Other long term (current) drug therapy: Secondary | ICD-10-CM | POA: Diagnosis not present

## 2022-06-22 DIAGNOSIS — E1159 Type 2 diabetes mellitus with other circulatory complications: Secondary | ICD-10-CM | POA: Diagnosis not present

## 2022-06-22 DIAGNOSIS — Z79890 Hormone replacement therapy (postmenopausal): Secondary | ICD-10-CM | POA: Diagnosis not present

## 2022-06-22 DIAGNOSIS — E559 Vitamin D deficiency, unspecified: Secondary | ICD-10-CM | POA: Diagnosis not present

## 2022-06-22 DIAGNOSIS — I152 Hypertension secondary to endocrine disorders: Secondary | ICD-10-CM | POA: Diagnosis not present

## 2022-06-22 DIAGNOSIS — Z978 Presence of other specified devices: Secondary | ICD-10-CM | POA: Diagnosis not present

## 2022-06-22 DIAGNOSIS — H409 Unspecified glaucoma: Secondary | ICD-10-CM | POA: Diagnosis not present

## 2022-06-22 LAB — HEMOGLOBIN A1C: Hemoglobin A1C: 6.7

## 2022-07-15 ENCOUNTER — Ambulatory Visit (INDEPENDENT_AMBULATORY_CARE_PROVIDER_SITE_OTHER): Payer: Medicare Other | Admitting: Orthopaedic Surgery

## 2022-07-15 DIAGNOSIS — M25511 Pain in right shoulder: Secondary | ICD-10-CM | POA: Diagnosis not present

## 2022-07-15 DIAGNOSIS — M25512 Pain in left shoulder: Secondary | ICD-10-CM

## 2022-07-15 DIAGNOSIS — G8929 Other chronic pain: Secondary | ICD-10-CM

## 2022-07-15 NOTE — Progress Notes (Signed)
The patient continues to have significant pain and disability of both his shoulders.  We have injected both shoulders and in 2021 he did have a right shoulder arthroscopy with debridement and subacromial decompression.  He has had several cervical spine surgeries he said by Dr. Lorin Mercy.  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  His shoulders are still hurting him quite a bit.  None of our interventions have helped thus far.  On exam both shoulders have some grinding of the glenohumeral joint on passive internal and external rotation with pain past 90 degrees of abduction of both shoulders as well.  Both shoulder show significant signs still of impingement as well.  At this point a MRI of both shoulders is warranted to assess the soft tissue and cartilage.  We will see him back once we have the studies.

## 2022-07-16 ENCOUNTER — Other Ambulatory Visit: Payer: Self-pay

## 2022-07-16 DIAGNOSIS — G8929 Other chronic pain: Secondary | ICD-10-CM

## 2022-07-16 DIAGNOSIS — M7541 Impingement syndrome of right shoulder: Secondary | ICD-10-CM

## 2022-07-16 DIAGNOSIS — M7542 Impingement syndrome of left shoulder: Secondary | ICD-10-CM

## 2022-07-23 ENCOUNTER — Other Ambulatory Visit: Payer: Self-pay | Admitting: Family Medicine

## 2022-08-10 ENCOUNTER — Ambulatory Visit
Admission: RE | Admit: 2022-08-10 | Discharge: 2022-08-10 | Disposition: A | Discharge status: Home / Self Care | Payer: Medicare Other | Source: Ambulatory Visit | Attending: Orthopaedic Surgery | Admitting: Orthopaedic Surgery

## 2022-08-10 DIAGNOSIS — G8929 Other chronic pain: Secondary | ICD-10-CM

## 2022-08-10 DIAGNOSIS — M19012 Primary osteoarthritis, left shoulder: Secondary | ICD-10-CM | POA: Diagnosis not present

## 2022-08-10 DIAGNOSIS — M7542 Impingement syndrome of left shoulder: Secondary | ICD-10-CM

## 2022-08-10 DIAGNOSIS — M7552 Bursitis of left shoulder: Secondary | ICD-10-CM | POA: Diagnosis not present

## 2022-08-10 DIAGNOSIS — M7541 Impingement syndrome of right shoulder: Secondary | ICD-10-CM

## 2022-08-10 DIAGNOSIS — M19011 Primary osteoarthritis, right shoulder: Secondary | ICD-10-CM | POA: Diagnosis not present

## 2022-08-10 DIAGNOSIS — M7551 Bursitis of right shoulder: Secondary | ICD-10-CM | POA: Diagnosis not present

## 2022-08-30 ENCOUNTER — Ambulatory Visit (INDEPENDENT_AMBULATORY_CARE_PROVIDER_SITE_OTHER): Payer: Medicare Other | Admitting: Orthopaedic Surgery

## 2022-08-30 ENCOUNTER — Encounter: Payer: Self-pay | Admitting: Orthopaedic Surgery

## 2022-08-30 DIAGNOSIS — M25511 Pain in right shoulder: Secondary | ICD-10-CM | POA: Diagnosis not present

## 2022-08-30 DIAGNOSIS — M7541 Impingement syndrome of right shoulder: Secondary | ICD-10-CM | POA: Diagnosis not present

## 2022-08-30 DIAGNOSIS — G8929 Other chronic pain: Secondary | ICD-10-CM | POA: Diagnosis not present

## 2022-08-30 DIAGNOSIS — M2042 Other hammer toe(s) (acquired), left foot: Secondary | ICD-10-CM | POA: Diagnosis not present

## 2022-08-30 DIAGNOSIS — L84 Corns and callosities: Secondary | ICD-10-CM | POA: Diagnosis not present

## 2022-08-30 DIAGNOSIS — M722 Plantar fascial fibromatosis: Secondary | ICD-10-CM | POA: Diagnosis not present

## 2022-08-30 DIAGNOSIS — M2041 Other hammer toe(s) (acquired), right foot: Secondary | ICD-10-CM | POA: Diagnosis not present

## 2022-08-30 DIAGNOSIS — M25572 Pain in left ankle and joints of left foot: Secondary | ICD-10-CM | POA: Diagnosis not present

## 2022-08-30 DIAGNOSIS — E1142 Type 2 diabetes mellitus with diabetic polyneuropathy: Secondary | ICD-10-CM | POA: Diagnosis not present

## 2022-08-30 DIAGNOSIS — B351 Tinea unguium: Secondary | ICD-10-CM | POA: Diagnosis not present

## 2022-08-30 DIAGNOSIS — M7542 Impingement syndrome of left shoulder: Secondary | ICD-10-CM

## 2022-08-30 DIAGNOSIS — M25512 Pain in left shoulder: Secondary | ICD-10-CM

## 2022-08-30 NOTE — Progress Notes (Signed)
The patient comes in to go over MRIs of both shoulders  He has a history of a right shoulder arthroscopy with subacromial decompression.  His left shoulder is what hurts him the worst.  Examination both shoulders does show signs of impingement once worse on the left than the right.  He has positive Neer and Hawkins sign the most of his pain is around the left [facility] joint.  His MRI of his left shoulder does show moderate tendinosis of the rotator cuff but no muscle atrophy at all.  There is no tearing of the cuff.  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  I think that is the main source of his left shoulder pain.  His right shoulder shows stable findings when compared to previous MRI in 2021.  He is having some tendinosis still of the rotator cuff and a small undersurface tear of the leg was seen on the arthroscopy pictures but nothing severe.  He then let me know that he has been having worsening pain around his parascapular area on the left side that radiates down into his left hand.  This been going on for some time now and he feels at times where his arm has gone to sleep.  He has had 2 cervical spine surgeries but they have been removed I believe in 1997 and then later in 2011.  I do feel that he is having more radicular symptoms in his left upper extremity and this is not all shoulder related.  He definitely needs an appointment with his spine surgeon to take a look at the cervical spine to see how much this is contributing to his left upper extremity radicular symptoms.  I can then see him back later in follow-up for the left shoulder.  He agrees with this treatment plan.

## 2022-08-31 IMAGING — DX DG SACRUM/COCCYX 2+V
3 series · 3 of 3 positions shown · non-contrast
Comparison: None.

CLINICAL DATA: Chronic low back pain radiating into both legs.

EXAM:
SACRUM AND COCCYX - 2+ VIEW

[coccyx ap]
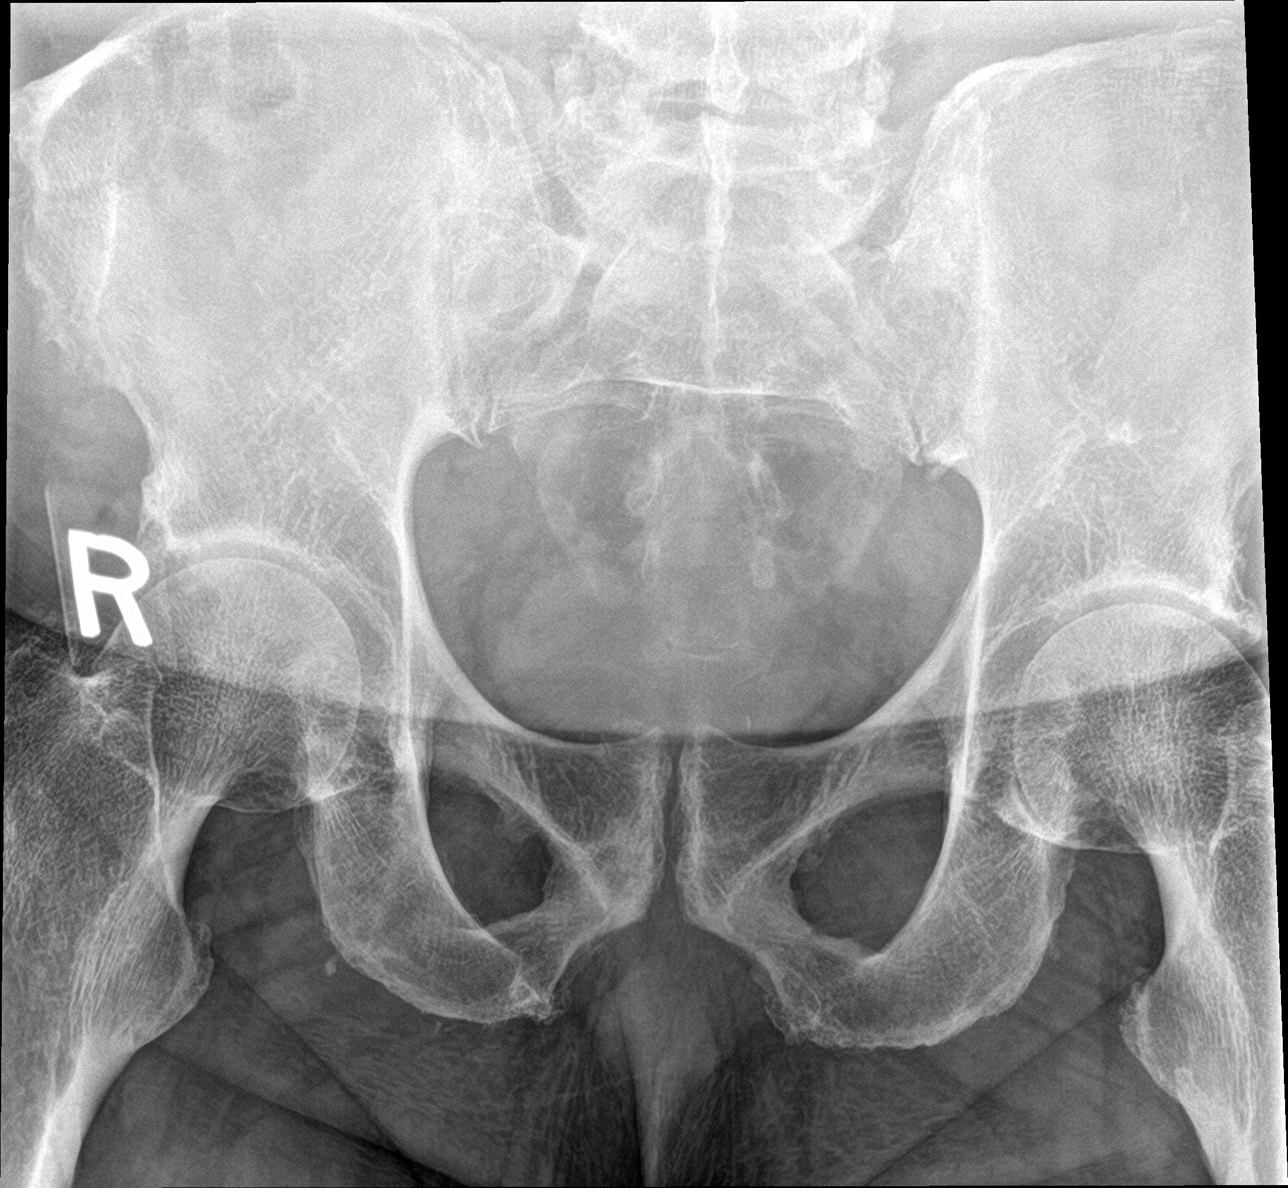

[sacrum ap]
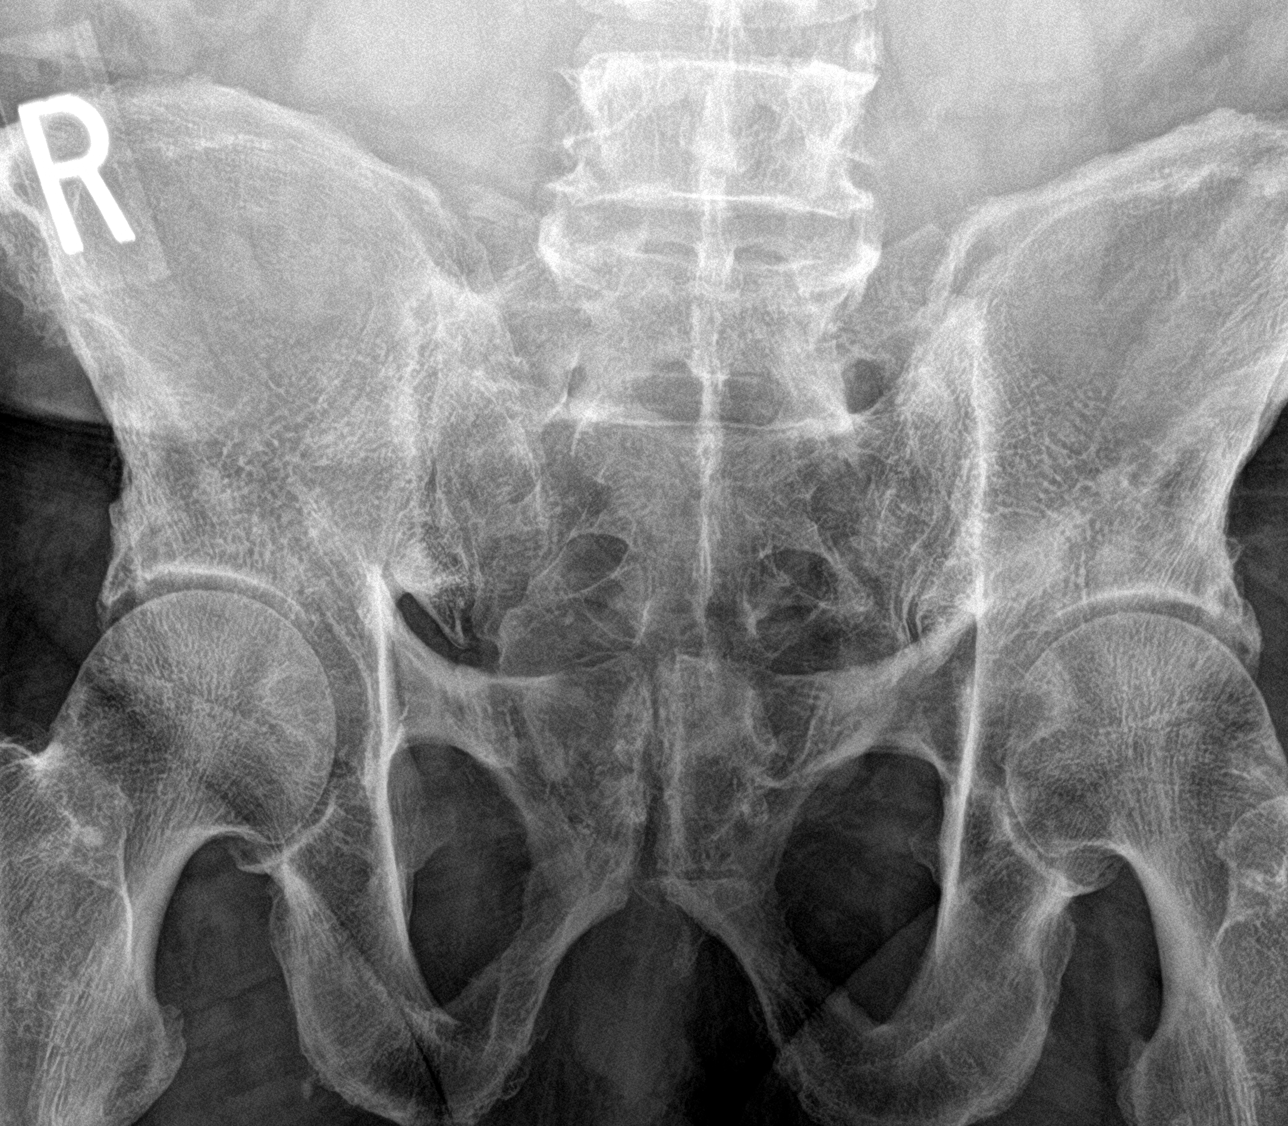

[sacrum lat]
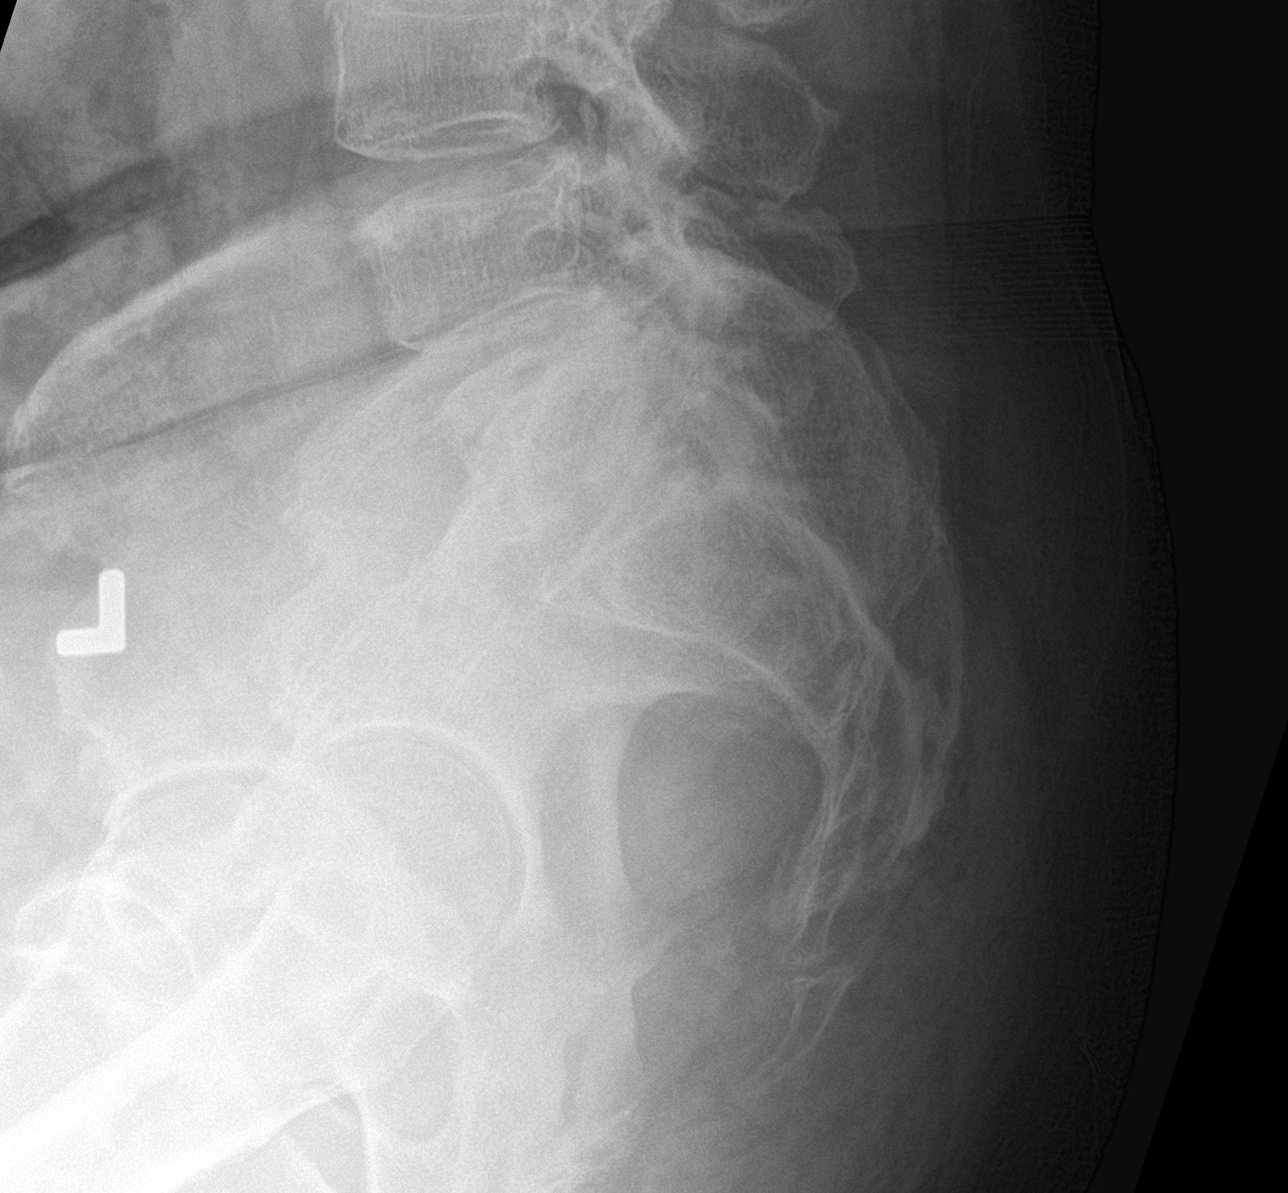

[3 of 3 positions shown; findings below may reference images not displayed]

FINDINGS: There is no evidence of fracture or other focal bone lesions. Mild
degenerative change is present about the SI joints. No evidence of
spondyloarthropathy. Soft tissues are negative.
IMPRESSION: Mild bilateral SI joint degenerative change.

## 2022-08-31 IMAGING — DX DG LUMBAR SPINE COMPLETE 4+V
5 series · 5 of 5 positions shown · non-contrast
Comparison: Plain films lumbar spine 08/17/2016.

CLINICAL DATA: Chronic low back pain radiating into both legs.

EXAM:
LUMBAR SPINE - COMPLETE 4+ VIEW

[l-spine ap]
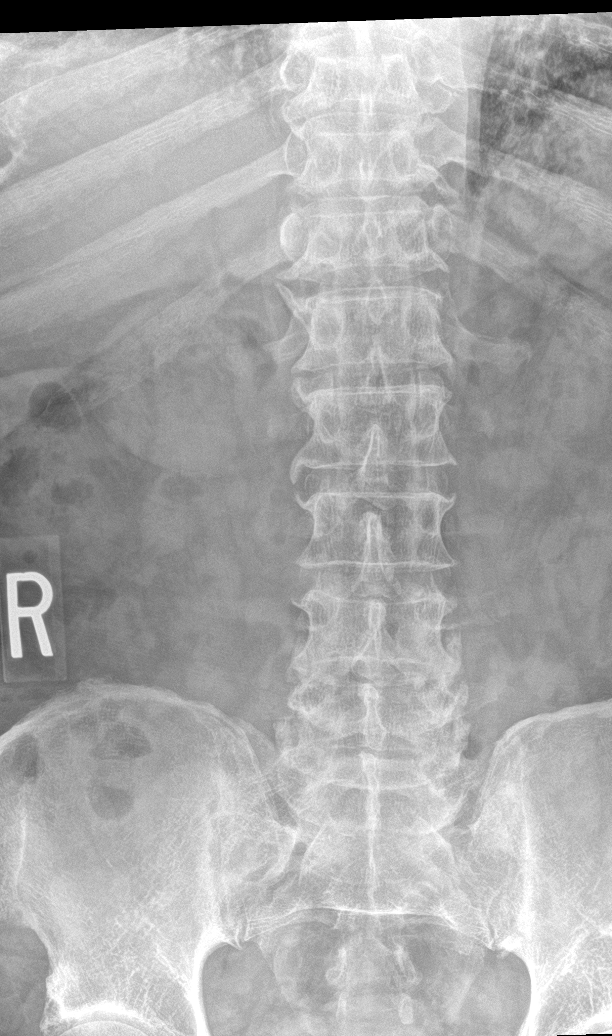

[l-spine obl (1 of 2)]
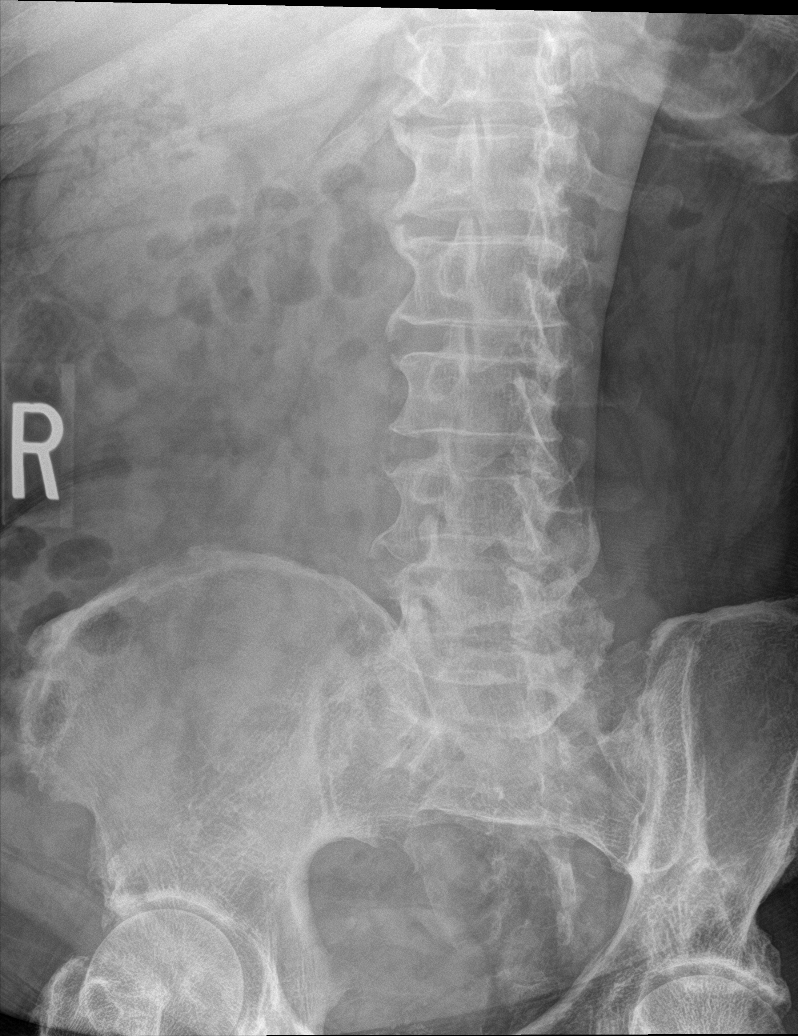

[l-spine obl (2 of 2)]
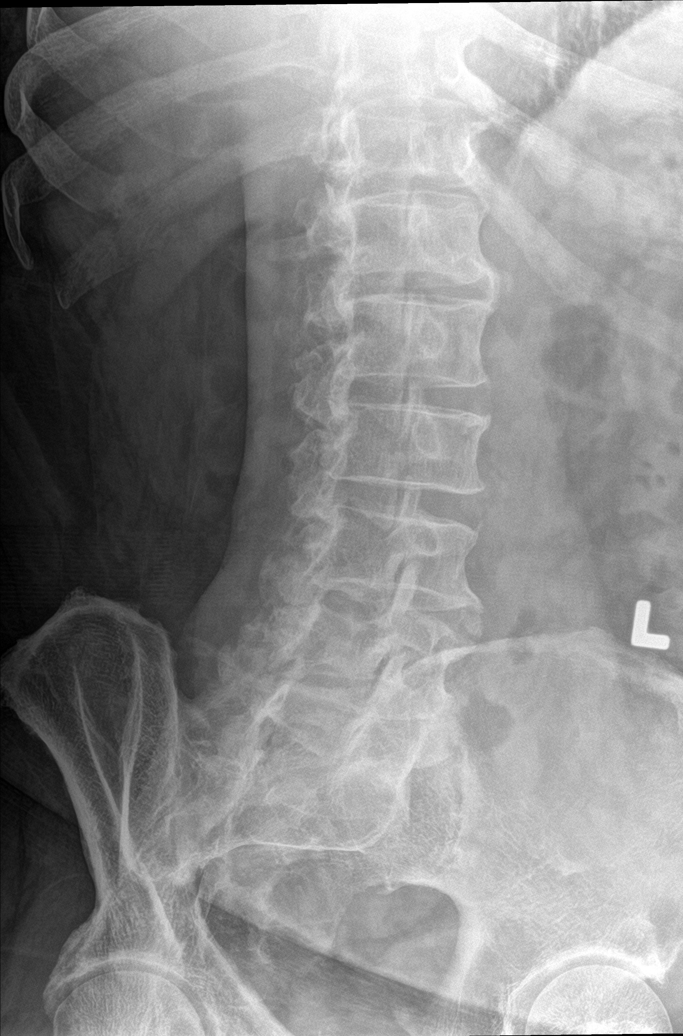

[l-spine lat]
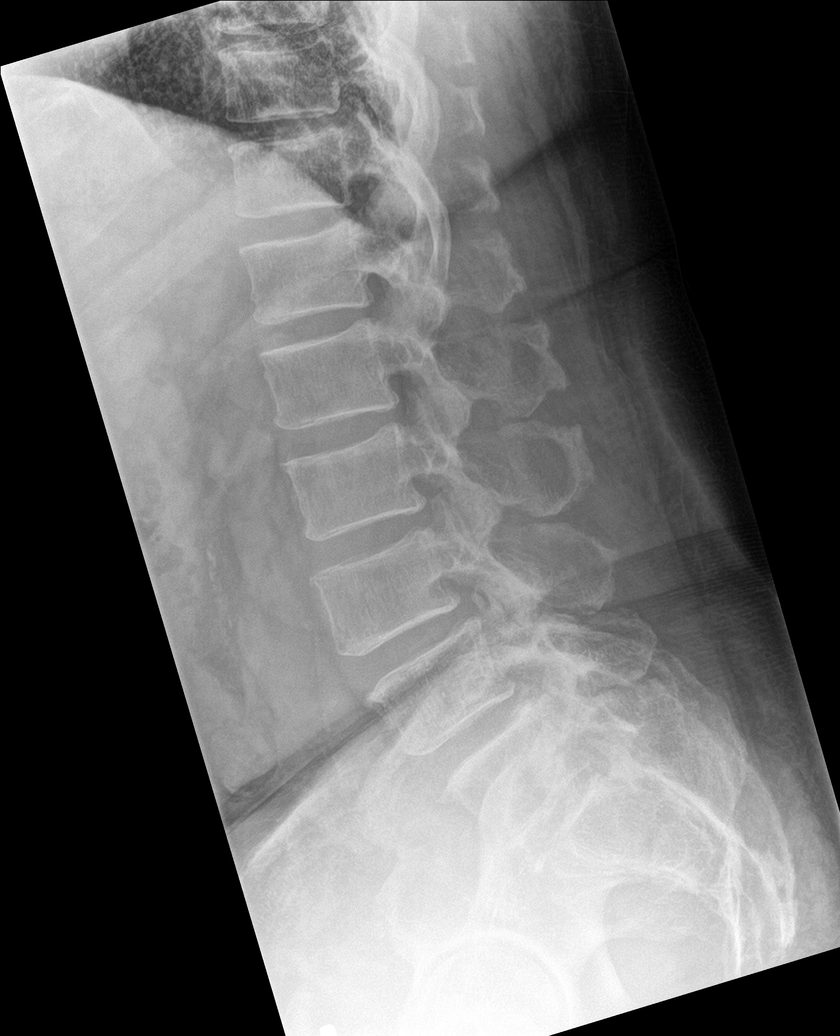

[l-spine spot]
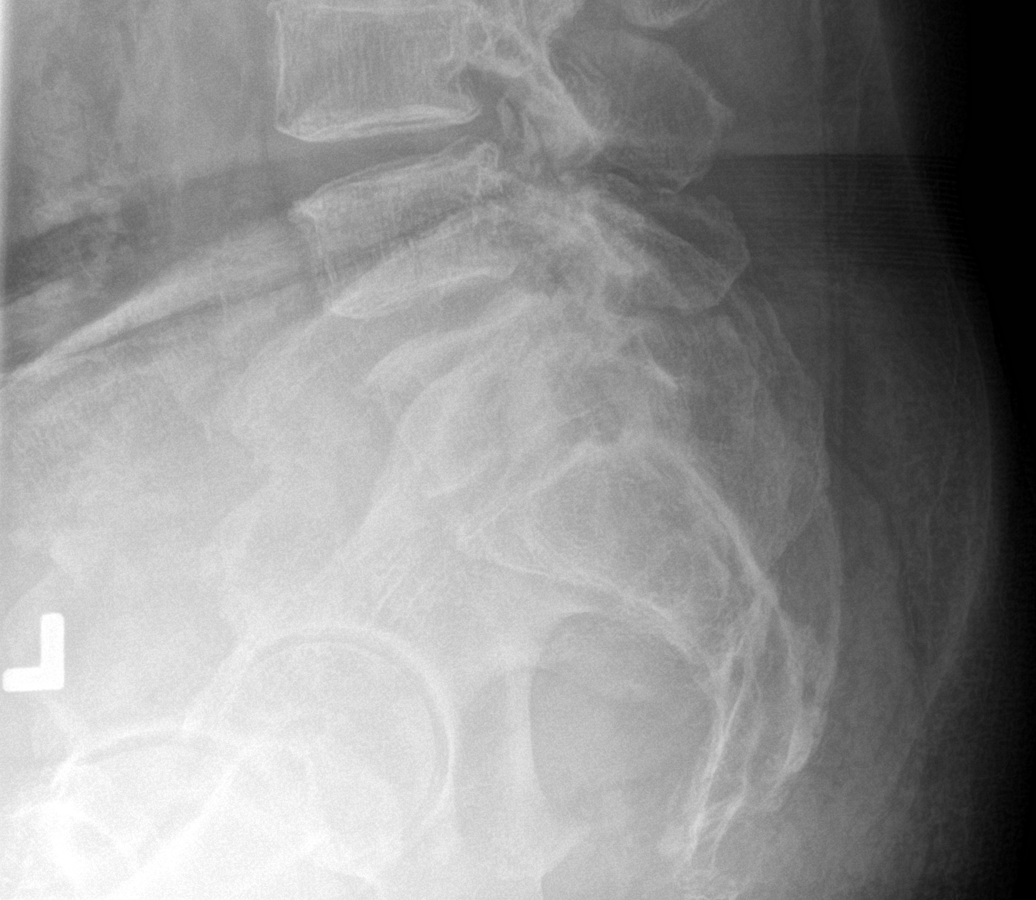

[5 of 5 positions shown; findings below may reference images not displayed]

FINDINGS: There is no evidence of lumbar spine fracture. Alignment is normal.
Intervertebral disc spaces are maintained. Facet degenerative
disease L4-5 and L5-S1 is noted. Paraspinous structures are
unremarkable.
IMPRESSION: Lower lumbar facet arthropathy.  The exam is otherwise negative.

## 2022-08-31 NOTE — Progress Notes (Signed)
Cardiology Office Note:   Date:  09/03/2022  ID:  Gregory Ready [name], DOB 01-Mar-1951, MRN [id number]  History of Present Illness:   Gregory URSINO [name] is a 72 y.o. male with history of DMII, OSA, HLD, AAA (small in 2019 at 3.0) and HTN who was previously followed by Dr. Katrinka Blazing who now presents to clinic for follow-up.  Coronary CTA 07/2018 with normal coronary arteries. Ca score 0.   Today, the patient overall feels well. Has been monitored for a small AAA by the [place] which we discussed today. Otherwise, feels well from a CV standpoint. Has occasional sharp chest pain that is not exertional in nature. No SOB, orthopnea or PND. Has chronic lymphedema and uses a lymphopress machine which has helped significantly.  Past Medical History:  Diagnosis Date   Allergy not sure   Arthritis    Chest pain    a. Normal cors 2001. b. Neg stress test 2012, 05/2014.   Diabetes mellitus without complication    GERD (gastroesophageal reflux disease)    Hyperlipidemia    Hypertension    Hypertensive response to exercise    Hypothyroidism    Obesity    Obstructive sleep apnea    Currently untreated    PONV (postoperative nausea and vomiting)    Sleep apnea      ROS: As per HPI  Studies Reviewed:    EKG:  NSR, HR 68  Cardiac Studies & Procedures     STRESS TESTS  MYOCARDIAL PERFUSION IMAGING 07/09/2015  Narrative  Nuclear stress EF: 54%.  There was no ST segment deviation noted during stress. 5:32min. 4/10 CP  This is a low risk study. No ischemia. No perfusion defects.  Donato Schultz, MD      CT SCANS  CT CORONARY MORPH W/CTA COR W/SCORE 07/04/2018  Addendum 07/04/2018 11:32 AM ADDENDUM REPORT: 07/04/2018 11:30  CLINICAL DATA:  Chest pain  EXAM: Cardiac CTA  MEDICATIONS: Sub lingual nitro. 4 mg and lopressor 5mg   TECHNIQUE: The patient was scanned on a [employer] 192 scanner. Gantry rotation speed was 250 msecs. Collimation was. 6 mm . A 120 kV prospective scan was  triggered in the ascending thoracic aorta at 140 HU's with full mA between 30-70% of the R-R interval . Average HR during the scan was 66 bpm. The 3D data set was interpreted on a dedicated work station using MPR, MIP and VRT modes. A total of 80 cc of contrast was used.  FINDINGS: Non-cardiac: See separate report from [facility]. No significant findings on limited lung and soft tissue windows.  Calcium score: No calcium noted  Coronary Arteries: Co- dominant with no anomalies  LM: Normal  LAD: Normal  D1: Normal  D2: Normal  D3: Normal  Circumflex: Normal  OM1: Normal  OM2: Normal  RCA: Normal  PDA: Normal  PLA: Normal  IMPRESSION: 1. Calcium score 0  2.  Normal co dominant coronary arteries  3.  Aortic root 3.8 cm  Charlton Haws   Electronically Signed By: Charlton Haws M.D. On: 07/04/2018 11:30  Narrative EXAM: OVER-READ INTERPRETATION  CT CHEST  The following report is an over-read performed by radiologist Dr. Charlett Nose of [facility] on 07/04/2018. This over-read does not include interpretation of cardiac or coronary anatomy or pathology. The coronary CTA interpretation by the cardiologist is attached.  COMPARISON:  11/07/2017  FINDINGS: Vascular: Heart is normal size.  Aorta is normal caliber.  Mediastinum/Nodes: No adenopathy in the lower mediastinum or hila.  Lungs/Pleura: No confluent airspace opacities or effusions.  Upper Abdomen: Imaging into the upper abdomen shows no acute findings.  Musculoskeletal: Chest wall soft tissues are unremarkable. No acute bony abnormality.  IMPRESSION: No acute or significant extracardiac abnormality.  Electronically Signed: By: Charlett Nose M.D. On: 07/04/2018 10:40           Risk Assessment/Calculations:              Physical Exam:   VS:  BP 116/80   Pulse 68   Ht 5' 9.5" (1.765 m)   Wt 266 lb 3.2 oz (120.7 kg)   SpO2 97%   BMI 38.75 kg/m    Wt Readings  from Last 3 Encounters:  09/03/22 266 lb 3.2 oz (120.7 kg)  03/05/22 263 lb (119.3 kg)  01/29/22 270 lb (122.5 kg)     GEN: Well nourished, well developed in no acute distress NECK: No JVD; No carotid bruits CARDIAC: RRR, no murmurs, rubs, gallops RESPIRATORY:  Clear to auscultation without rales, wheezing or rhonchi  ABDOMEN: Soft, non-tender, non-distended EXTREMITIES:  No edema; No deformity   ASSESSMENT AND PLAN:   #Chest Pain: -Reassuring cardiac work-up with coronary CTA 2020 with normal coronaries; Ca score 0 -Currently doing well without anginal symptoms  #HTN: -Continue losartan 25mg  daily -Continue propranolol 60mg  daily -BP is at goal <130/90  #HLD: -Continue crestor 5mg  daily -Continue zetia 10mg  daily -Check lipids today; goal LDL<70  #DMII: -Continue trulicity, metformin, jardiance -A1C was 6.6 at [place]  #AAA: -Being monitored at the [place] -Last one in our system was 2019 and measuring 3.0cm -Continue crestor 5mg  daily -Continue zetia 10mg  daily -ASA 81mg  daily          Signed, Meriam Sprague, MD

## 2022-09-02 DIAGNOSIS — G4733 Obstructive sleep apnea (adult) (pediatric): Secondary | ICD-10-CM | POA: Diagnosis not present

## 2022-09-03 ENCOUNTER — Ambulatory Visit: Payer: Medicare Other | Attending: Cardiology | Admitting: Cardiology

## 2022-09-03 ENCOUNTER — Encounter: Payer: Self-pay | Admitting: Cardiology

## 2022-09-03 VITALS — BP 116/80 | HR 68 | Ht 69.5 in | Wt 266.2 lb

## 2022-09-03 DIAGNOSIS — E785 Hyperlipidemia, unspecified: Secondary | ICD-10-CM | POA: Diagnosis not present

## 2022-09-03 DIAGNOSIS — E782 Mixed hyperlipidemia: Secondary | ICD-10-CM | POA: Insufficient documentation

## 2022-09-03 DIAGNOSIS — R079 Chest pain, unspecified: Secondary | ICD-10-CM | POA: Diagnosis not present

## 2022-09-03 DIAGNOSIS — I700 Atherosclerosis of aorta: Secondary | ICD-10-CM | POA: Diagnosis not present

## 2022-09-03 DIAGNOSIS — I10 Essential (primary) hypertension: Secondary | ICD-10-CM

## 2022-09-03 DIAGNOSIS — E118 Type 2 diabetes mellitus with unspecified complications: Secondary | ICD-10-CM | POA: Insufficient documentation

## 2022-09-03 MED ORDER — EMPAGLIFLOZIN 25 MG PO TABS
25.0000 mg | ORAL_TABLET | Freq: Every day | ORAL | 3 refills | Status: AC
Start: 2022-09-03 — End: ?

## 2022-09-03 MED ORDER — EZETIMIBE 10 MG PO TABS: 10.0000 mg | ORAL_TABLET | Freq: Every day | ORAL | 4 refills | Status: AC

## 2022-09-03 MED ORDER — ROSUVASTATIN CALCIUM 5 MG PO TABS
5.0000 mg | ORAL_TABLET | Freq: Every day | ORAL | 4 refills | Status: AC
Start: 2022-09-03 — End: ?

## 2022-09-03 MED ORDER — LOSARTAN POTASSIUM 25 MG PO TABS
25.0000 mg | ORAL_TABLET | Freq: Every day | ORAL | 4 refills | Status: DC
Start: 2022-09-03 — End: 2023-04-22

## 2022-09-03 NOTE — Patient Instructions (Signed)
Medication Instructions:   Your physician recommends that you continue on your current medications as directed. Please refer to the Current Medication list given to you today.  *If you need a refill on your cardiac medications before your next appointment, please call your pharmacy*   Lab Work:  TODAY--LIPIDS  If you have labs (blood work) drawn today and your tests are completely normal, you will receive your results only by: MyChart Message (if you have MyChart) OR A paper copy in the mail If you have any lab test that is abnormal or we need to change your treatment, we will call you to review the results.   Follow-Up: At [facility], you and your health needs are our priority.  As part of our continuing mission to provide you with exceptional heart care, we have created designated Provider Care Teams.  These Care Teams include your primary Cardiologist (physician) and Advanced Practice Providers (APPs -  Physician Assistants and Nurse Practitioners) who all work together to provide you with the care you need, when you need it.  We recommend signing up for the patient portal called "MyChart".  Sign up information is provided on this After Visit Summary.  MyChart is used to connect with patients for Virtual Visits (Telemedicine).  Patients are able to view lab/test results, encounter notes, upcoming appointments, etc.  Non-urgent messages can be sent to your provider as well.   To learn more about what you can do with MyChart, go to [email or web address].    Your next appointment:   1 year(s)  Provider:   DR. PEMBERTON   

## 2022-09-04 LAB — LIPID PANEL
Chol/HDL Ratio: 5.3 ratio — ABNORMAL HIGH (ref 0.0–5.0)
Cholesterol, Total: 200 mg/dL — ABNORMAL HIGH (ref 100–199)
HDL: 38 mg/dL — ABNORMAL LOW (ref >39)
LDL Chol Calc (NIH): 144 mg/dL — ABNORMAL HIGH (ref 0–99)
Triglycerides: 98 mg/dL (ref 0–149)
VLDL Cholesterol Cal: 18 mg/dL (ref 5–40)

## 2022-09-06 ENCOUNTER — Telehealth: Payer: Self-pay | Admitting: *Deleted

## 2022-09-06 DIAGNOSIS — E785 Hyperlipidemia, unspecified: Secondary | ICD-10-CM

## 2022-09-06 DIAGNOSIS — E782 Mixed hyperlipidemia: Secondary | ICD-10-CM

## 2022-09-06 DIAGNOSIS — Z79899 Other long term (current) drug therapy: Secondary | ICD-10-CM

## 2022-09-06 NOTE — Telephone Encounter (Signed)
The patient has been notified of the result and verbalized understanding.  All questions (if any) were answered.  Pt states he started back taking both his crestor and zetia right after he saw Dr. Shari Prows in clinic on 4/5.  He states he is going to take this everyday with no skipped doses.    Scheduled the pt for repeat lipids in 3 months to reassess his numbers, since restarting back both meds.  Lab appt scheduled for 12/06/22 to recheck lipids.  He is aware to come fasting.   Pt verbalized understanding and agrees with this plan.  Will make Dr. Shari Prows aware of this.

## 2022-09-06 NOTE — Telephone Encounter (Signed)
-----   Message from Meriam Sprague, MD sent at 09/06/2022 10:08 AM EDT ----- His cholesterol has gone up significantly. Has he been taking the crestor and zetia?

## 2022-09-08 ENCOUNTER — Encounter: Payer: Self-pay | Admitting: Family Medicine

## 2022-09-08 ENCOUNTER — Ambulatory Visit (INDEPENDENT_AMBULATORY_CARE_PROVIDER_SITE_OTHER): Payer: Medicare Other | Admitting: Family Medicine

## 2022-09-08 VITALS — BP 117/70 | HR 71 | Ht 69.5 in | Wt 269.0 lb

## 2022-09-08 DIAGNOSIS — F5101 Primary insomnia: Secondary | ICD-10-CM

## 2022-09-08 DIAGNOSIS — T148XXA Other injury of unspecified body region, initial encounter: Secondary | ICD-10-CM | POA: Insufficient documentation

## 2022-09-08 MED ORDER — TRAZODONE HCL 50 MG PO TABS: ORAL_TABLET | ORAL | 3 refills | Status: DC

## 2022-09-08 NOTE — Assessment & Plan Note (Signed)
Area with evolving bruise on R chest wall.  He will let me know if not improving over the next few weeks.

## 2022-09-08 NOTE — Progress Notes (Signed)
Gregory Readyichard A Krupp [name] - 72 y.o. male MRN [id number]  Date of birth: [birth year]  Subjective Chief Complaint  Patient presents with   Bruise    HPI Gregory Readyichard A Nesmith [name] Is a 72 y.o. male here today with complaint of bruise on the chest.  Reports that about 2 weeks ago he had a sore area about size of a dime on the R chest wall.  Since then the area has evolved into more of a flat bruise.  Area is not painful now.  Does not recall any trauma to the area.   He would also like to have his trazodone renewed.  This continues to work well for him without significant side effects.  He remains compliant with CPAP.   ROS:  A comprehensive ROS was completed and negative except as noted per HPI  Allergies  Allergen Reactions   Statins     Muscle cramping all over body. Lipitor, Crestor, and pravastatin.   Atorvastatin     Other reaction(s): "severe cramps in muscles and tendons"   Rosuvastatin     Other reaction(s): "severe cramps in muscles and tendons"   Liraglutide Other (See Comments)    Caused pancreatitis Other reaction(s): pancreas infection   Lisinopril Cough    Past Medical History:  Diagnosis Date   Allergy not sure   Arthritis    Chest pain    a. Normal cors 2001. b. Neg stress test 2012, 05/2014.   Diabetes mellitus without complication    GERD (gastroesophageal reflux disease)    Hyperlipidemia    Hypertension    Hypertensive response to exercise    Hypothyroidism    Obesity    Obstructive sleep apnea    Currently untreated    PONV (postoperative nausea and vomiting)    Sleep apnea     Past Surgical History:  Procedure Laterality Date   CARDIAC CATHETERIZATION     CERVICAL FUSION     HERNIA REPAIR     KNEE ARTHROSCOPY  1984   both knee   SHOULDER ARTHROSCOPY WITH SUBACROMIAL DECOMPRESSION Right 09/13/2019   Procedure: RIGHT SHOULDER ARTHROSCOPY WITH EXTENSIVE DEBRIDEMENT, SUBACROMIAL DECOMPRESSION,;  Surgeon: Kathryne HitchBlackman, Christopher Y, MD;  Location: [facility];  Service: Orthopedics;  Laterality: Right;   SPINE SURGERY  1st Sept 1977   2nd Feb 2011    Social History   Socioeconomic History   Marital status: Married    Spouse name: Elease Hashimotoatricia   Number of children: 2   Years of education: 16   Highest education level: Bachelor's degree (e.g., BA, AB, BS)  Occupational History    Comment: Retired  Tobacco Use   Smoking status: Former    Packs/day: 1.50    Years: 30.00    Additional pack years: 0.00    Total pack years: 45.00    Types: Cigarettes    Quit date: 1995    Years since quitting: 29.2   Smokeless tobacco: Never  Vaping Use   Vaping Use: Never used  Substance and Sexual Activity   Alcohol use: No   Drug use: No   Sexual activity: Yes    Birth control/protection: None    Comment: Not Necessary  Other Topics Concern   Not on file  Social History Narrative   Lives with his wife. He is looking for a job. He likes to read his bible and go fishing in his free time.    Social Determinants of Health   Financial Resource Strain: Low Risk  (09/06/2022)  Overall Financial Resource Strain (CARDIA)    Difficulty of Paying Living Expenses: Not very hard  Food Insecurity: Food Insecurity Present (09/06/2022)   Hunger Vital Sign    Worried About Running Out of Food in the Last Year: Never true    Ran Out of Food in the Last Year: Sometimes true  Transportation Needs: No Transportation Needs (09/06/2022)   PRAPARE - Administrator, Civil Service (Medical): No    Lack of Transportation (Non-Medical): No  Physical Activity: Insufficiently Active (09/06/2022)   Exercise Vital Sign    Days of Exercise per Week: 3 days    Minutes of Exercise per Session: 30 min  Stress: Stress Concern Present (09/06/2022)   [employer] of Occupational Health - Occupational Stress Questionnaire    Feeling of Stress : To some extent  Social Connections: Socially Integrated (09/06/2022)   Social Connection and Isolation Panel  [NHANES]    Frequency of Communication with Friends and Family: More than three times a week    Frequency of Social Gatherings with Friends and Family: Three times a week    Attends Religious Services: More than 4 times per year    Active Member of Clubs or Organizations: Yes    Attends Engineer, structural: More than 4 times per year    Marital Status: Married    Family History  Problem Relation Age of Onset   Diabetes Mother    Heart disease Mother    Hypertension Mother    Stroke Mother    Heart attack Mother    COPD Mother    Cancer Father    Heart disease Father    Diabetes Father    Hyperlipidemia Sister    Hypertension Sister    Diabetes Maternal Grandmother    Stroke Maternal Grandmother    Heart attack Maternal Grandmother    Heart disease Maternal Grandmother    Diabetes Maternal Grandfather    Hypertension Maternal Grandfather    Heart attack Maternal Grandfather    Diabetes Sister    Hypertension Sister    Cancer Sister    Heart attack Paternal Grandmother    Heart attack Paternal Grandfather    Cancer Sister    COPD Maternal Aunt    Diabetes Son    Diabetes Maternal Aunt     Health Maintenance  Topic Date Due   Medicare Annual Wellness (AWV)  09/17/2022   COVID-19 Vaccine (4 - 2023-24 season) 03/26/2023 (Originally 01/29/2022)   Diabetic kidney evaluation - Urine ACR  11/12/2022   HEMOGLOBIN A1C  12/21/2022   INFLUENZA VACCINE  12/30/2022   Diabetic kidney evaluation - eGFR measurement  01/30/2023   OPHTHALMOLOGY EXAM  06/12/2023   FOOT EXAM  09/08/2023   DTaP/Tdap/Td (5 - Td or Tdap) 07/30/2025   COLONOSCOPY (Pts 45-79yrs Insurance coverage will need to be confirmed)  02/23/2026   Pneumonia Vaccine 27+ Years old  Completed   Hepatitis C Screening  Completed   Zoster Vaccines- Shingrix  Completed   HPV VACCINES  Aged Out      ----------------------------------------------------------------------------------------------------------------------------------------------------------------------------------------------------------------- Physical Exam BP 117/70 (BP Location: Left Arm, Patient Position: Sitting, Cuff Size: Large)   Pulse 71   Ht 5' 9.5" (1.765 m)   Wt 269 lb (122 kg)   SpO2 94%   BMI 39.15 kg/m   Physical Exam Constitutional:      Appearance: Normal appearance.  HENT:     Head: Normocephalic and atraumatic.  Eyes:     General: No scleral icterus.  Cardiovascular:     Rate and Rhythm: Normal rate and regular rhythm.  Pulmonary:     Effort: Pulmonary effort is normal.     Breath sounds: Normal breath sounds.  Musculoskeletal:     Cervical back: Neck supple.     Comments: Area of bruising noted on R chest wall.  Non-tender.  No palpable underlying mass noted.    Neurological:     Mental Status: He is alert.     ------------------------------------------------------------------------------------------------------------------------------------------------------------------------------------------------------------------- Assessment and Plan  Bruising Area with evolving bruise on R chest wall.  He will let me know if not improving over the next few weeks.   Insomnia He will continue trazodone at bedtime.  Rx renewed.     Meds ordered this encounter  Medications   traZODone (DESYREL) 50 MG tablet    Sig: TAKE ONE-HALF (1/2) TO ONE TABLET AT BEDTIME AS NEEDED FOR SLEEP    Dispense:  90 tablet    Refill:  3    Return in about 3 weeks (around 09/29/2022) for F/u bruise on chest wall.    This visit occurred during the SARS-CoV-2 public health emergency.  Safety protocols were in place, including screening questions prior to the visit, additional usage of staff PPE, and extensive cleaning of exam room while observing appropriate contact time as indicated for disinfecting solutions.

## 2022-09-08 NOTE — Assessment & Plan Note (Signed)
He will continue trazodone at bedtime.  Rx renewed.

## 2022-09-09 ENCOUNTER — Ambulatory Visit: Payer: Medicare Other | Admitting: Orthopaedic Surgery

## 2022-09-14 ENCOUNTER — Other Ambulatory Visit (INDEPENDENT_AMBULATORY_CARE_PROVIDER_SITE_OTHER): Payer: Medicare Other

## 2022-09-14 ENCOUNTER — Ambulatory Visit: Payer: Medicare Other | Admitting: Orthopaedic Surgery

## 2022-09-14 ENCOUNTER — Encounter: Payer: Self-pay | Admitting: Orthopaedic Surgery

## 2022-09-14 ENCOUNTER — Ambulatory Visit (INDEPENDENT_AMBULATORY_CARE_PROVIDER_SITE_OTHER): Payer: Medicare Other | Admitting: Orthopaedic Surgery

## 2022-09-14 VITALS — BP 121/80 | HR 73 | Ht 69.5 in | Wt 263.0 lb

## 2022-09-14 DIAGNOSIS — M542 Cervicalgia: Secondary | ICD-10-CM | POA: Diagnosis not present

## 2022-09-14 DIAGNOSIS — M4322 Fusion of spine, cervical region: Secondary | ICD-10-CM | POA: Diagnosis not present

## 2022-09-14 NOTE — Progress Notes (Signed)
Office Visit Note   Patient: Gregory HAGOS [name]           Date of Birth: 08/11/1950           MRN: [id number] Visit Date: 09/14/2022              Requested by: [name], DO [address] [place],  [place] [address] PCP: [name], DO   Assessment & Plan: Visit Diagnoses:  1. Neck pain   2. 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  Now having radicular symptoms left arm in addition to some impingement problems with the shoulder.  Would recommend proceeding with a cervical MRI scan to evaluate him for left hemicord compression.  I will follow-up after scan for review.  Follow-Up Instructions: No follow-ups on file.   Orders:  Orders Placed This Encounter  Procedures   XR Cervical Spine 2 or 3 views   No orders of the defined types were placed in this encounter.     Procedures: No procedures performed   Clinical Data: No additional findings.   Subjective: Chief Complaint  Patient presents with   Neck - Pain    HPI 72 year old male had Cloward fusion at C5-6 by me 1997 healed solid.  Later C3-4 C4-5 fusions with allograft plate and screws which are also solid.  He is here with left arm pain that radiates down his arm to his entire hand with numbness.  He has had some shoulder problems and MRI shows some tendinopathy without full-thickness tear.  He does have some significant left shoulder biceps tendinopathy and had seen Dr. Magnus Ivan who discussed possible shoulder arthroscopy.  Patient denies any myelopathic gait problems.  He is here with his wife.  He has some decreased rotation of his neck and states at times that radiates from the left side of his neck into his shoulder down to the entire hand making all fingers numb.  He has not had any falling in the last year.  Patient's additionally had problems with hypertension with diabetes, GERD, diabetic neuropathy.  Impingement in his shoulders  and some knee arthritis.  Patient is a cortisone injection he has been through anti-inflammatories.  Exercise program for his neck and shoulders without relief.  Review of Systems all other systems are noncontributory to HPI.   Objective: Vital Signs: BP 121/80   Pulse 73   Ht 5' 9.5" (1.765 m)   Wt 263 lb (119.3 kg)   BMI 38.28 kg/m   Physical Exam Constitutional:      Appearance: He is well-developed.  HENT:     Head: Normocephalic and atraumatic.     Right Ear: External ear normal.     Left Ear: External ear normal.  Eyes:     Pupils: Pupils are equal, round, and reactive to light.  Neck:     Thyroid: No thyromegaly.     Trachea: No tracheal deviation.  Cardiovascular:     Rate and Rhythm: Normal rate.  Pulmonary:     Effort: Pulmonary effort is normal.     Breath sounds: No wheezing.  Abdominal:     General: Bowel sounds are normal.     Palpations: Abdomen is soft.  Musculoskeletal:     Cervical back: Neck supple.  Skin:    General: Skin is warm and dry.     Capillary Refill: Capillary refill takes less than 2 seconds.  Neurological:  Mental Status: He is alert and oriented to person, place, and time.  Psychiatric:        Behavior: Behavior normal.        Thought Content: Thought content normal.        Judgment: Judgment normal.     Ortho Exam well-healed left side neck anterior incision.  Brachial plexus tenderness on the left negative on the right.  Upper extremity reflexes are 1+ and symmetrical.  Good grip strength no interosseous weakness.  Slight triceps weakness on the left normal on the right.  Long head the biceps is moderate to severely tender anteriorly no deltoid atrophy.  Normal heel-toe gait no wide-based gait no lower extremity clonus.  Specialty Comments:  No specialty comments available.  Imaging: XR Cervical Spine 2 or 3 views  Result Date: 09/14/2022 AP and lateral cervical spine images are obtained and reviewed.  This shows allograft  and plate anterior fixation C3-4 C4-5 anterior fusion.  Cloward fusion C5-6 with some spondylosis C6-7.  Anterior osteophytes that may be bridged anteriorly at C2-3 without posterior osteophytes or bridging. Impression: Post cervical fusions with adjacent level spondylosis.    PMFS History: Patient Active Problem List   Diagnosis Date Noted   Fusion of spine of cervical region 09/14/2022   Bruising 09/08/2022   Acute tear of posterior aspect of lateral meniscus of left knee 01/29/2022   Bilateral third flexor stenosing tenosynovitis and first carpometacarpal osteoarthritis. 10/30/2021   Exercise counseling 07/29/2021   Other specified counseling 07/29/2021   Primary open-angle glaucoma, bilateral, mild stage 07/29/2021   Diabetic neuropathy 07/31/2020   Abdominal aortic aneurysm 07/31/2020   Insomnia 07/31/2020   GERD (gastroesophageal reflux disease) 01/09/2020   Impingement syndrome of both shoulders 08/02/2019   Interstitial pulmonary disease 06/29/2018   Hypertension associated with diabetes 07/13/2017   Benign essential tremor 07/13/2017   Kidney cyst, acquired 01/24/2017   Seborrheic keratoses 04/01/2016   Diverticulosis of colon without hemorrhage 02/26/2016   Anxiety state 10/02/2015   BPH (benign prostatic hyperplasia) 07/31/2015   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 Normal cors 2001. b. Neg stress test 2012, 05/2014.   Diabetes mellitus without complication    GERD (gastroesophageal reflux disease)    Hyperlipidemia    Hypertension    Hypertensive response to exercise    Hypothyroidism    Obesity     Obstructive sleep apnea    Currently untreated    PONV (postoperative nausea and vomiting)    Sleep apnea     Family History  Problem Relation Age of Onset   Diabetes Mother    Heart disease Mother    Hypertension Mother    Stroke Mother    Heart attack Mother    COPD Mother    Cancer Father    Heart disease Father    Diabetes Father    Hyperlipidemia Sister    Hypertension Sister    Diabetes Maternal Grandmother    Stroke Maternal Grandmother    Heart attack Maternal Grandmother    Heart disease Maternal Grandmother    Diabetes Maternal Grandfather    Hypertension Maternal Grandfather  Heart attack Maternal Grandfather    Diabetes Sister    Hypertension Sister    Cancer Sister    Heart attack Paternal Grandmother    Heart attack Paternal Grandfather    Cancer Sister    COPD Maternal Aunt    Diabetes Son    Diabetes Maternal Aunt     Past Surgical History:  Procedure Laterality Date   CARDIAC CATHETERIZATION     CERVICAL FUSION     HERNIA REPAIR     KNEE ARTHROSCOPY  1984   both knee   SHOULDER ARTHROSCOPY WITH SUBACROMIAL DECOMPRESSION Right 09/13/2019   Procedure: RIGHT SHOULDER ARTHROSCOPY WITH EXTENSIVE DEBRIDEMENT, SUBACROMIAL DECOMPRESSION,;  Surgeon: Kathryne Hitch, MD;  Location: [facility];  Service: Orthopedics;  Laterality: Right;   SPINE SURGERY  1st Sept 1977   2nd Feb 2011   Social History   Occupational History    Comment: Retired  Tobacco Use   Smoking status: Former    Packs/day: 1.50    Years: 30.00    Additional pack years: 0.00    Total pack years: 45.00    Types: Cigarettes    Quit date: 1995    Years since quitting: 29.3   Smokeless tobacco: Never  Vaping Use   Vaping Use: Never used  Substance and Sexual Activity   Alcohol use: No   Drug use: No   Sexual activity: Yes    Birth control/protection: None    Comment: Not Necessary

## 2022-09-14 NOTE — Addendum Note (Signed)
Addended by: Rogers Seeds on: 09/14/2022 05:05 PM   Modules accepted: Orders

## 2022-09-20 ENCOUNTER — Ambulatory Visit (INDEPENDENT_AMBULATORY_CARE_PROVIDER_SITE_OTHER): Payer: Medicare Other | Admitting: Family Medicine

## 2022-09-20 DIAGNOSIS — Z0000 Encounter for general adult medical examination without abnormal findings: Secondary | ICD-10-CM | POA: Diagnosis not present

## 2022-09-20 NOTE — Patient Instructions (Signed)
MEDICARE ANNUAL WELLNESS VISIT Health Maintenance Summary and Written Plan of Care  Gregory West ,  Thank you for allowing me to perform your Medicare Annual Wellness Visit and for your ongoing commitment to your health.   Health Maintenance & Immunization History Health Maintenance  Topic Date Due   COVID-19 Vaccine (4 - 2023-24 season) 03/26/2023 (Originally 01/29/2022)   Diabetic kidney evaluation - Urine ACR  11/12/2022   HEMOGLOBIN A1C  12/21/2022   INFLUENZA VACCINE  12/30/2022   Diabetic kidney evaluation - eGFR measurement  01/30/2023   OPHTHALMOLOGY EXAM  06/12/2023   FOOT EXAM  09/08/2023   Medicare Annual Wellness (AWV)  09/20/2023   DTaP/Tdap/Td (5 - Td or Tdap) 07/30/2025   COLONOSCOPY (Pts 45-55yrs Insurance coverage will need to be confirmed)  02/23/2026   Pneumonia Vaccine 71+ Years old  Completed   Hepatitis C Screening  Completed   Zoster Vaccines- Shingrix  Completed   HPV VACCINES  Aged Out   Immunization History  Administered Date(s) Administered   Fluad Quad(high Dose 65+) 01/31/2019, 02/01/2020, 01/29/2021, 03/05/2022   Influenza Split 02/13/2012, 03/06/2014   Influenza, High Dose Seasonal PF 01/21/2017, 01/31/2019   Influenza,inj,Quad PF,6+ Mos 04/06/2011, 01/12/2018   Influenza-Unspecified 03/31/2010, 02/09/2012, 03/06/2014, 02/28/2015, 03/14/2016, 01/21/2017, 01/29/2017, 02/02/2019, 01/30/2020, 02/06/2020   PFIZER(Purple Top)SARS-COV-2 Vaccination 08/03/2019, 08/19/2019, 08/31/2019   Pneumococcal Conjugate-13 01/21/2017   Pneumococcal Polysaccharide-23 09/07/2000, 07/31/2015, 02/13/2020   Pneumococcal-Unspecified 08/29/2009   Td 04/10/2001   Tdap 07/07/2011, 01/29/2013, 07/31/2015   Zoster Recombinat (Shingrix) 10/29/2020, 05/13/2021    These are the patient goals that we discussed:  Goals Addressed               This Visit's Progress     Patient Stated (pt-stated)        Patient stated that he would like to loose 20 lbs.          This is a list of Health Maintenance Items that are overdue or due now: There are no preventive care reminders to display for this patient.    Orders/Referrals Placed Today: No orders of the defined types were placed in this encounter.  (Contact our referral department at [phone] if you have not spoken with someone about your referral appointment within the next 5 days)    Follow-up Plan Follow-up with [name], DO as planned Medicare wellness visit in one year.  Patient will access AVS on my chart.      Health Maintenance, Male Adopting a healthy lifestyle and getting preventive care are important in promoting health and wellness. Ask your health care provider about: The right schedule for you to have regular tests and exams. Things you can do on your own to prevent diseases and keep yourself healthy. What should I know about diet, weight, and exercise? Eat a healthy diet  Eat a diet that includes plenty of vegetables, fruits, low-fat dairy products, and lean protein. Do not eat a lot of foods that are high in solid fats, added sugars, or sodium. Maintain a healthy weight Body mass index (BMI) is a measurement that can be used to identify possible weight problems. It estimates body fat based on height and weight. Your health care provider can help determine your BMI and help you achieve or maintain a healthy weight. Get regular exercise Get regular exercise. This is one of the most important things you can do for your health. Most adults should: Exercise for at least 150 minutes each week. The exercise should increase your heart rate  and make you sweat (moderate-intensity exercise). Do strengthening exercises at least twice a week. This is in addition to the moderate-intensity exercise. Spend less time sitting. Even light physical activity can be beneficial. Watch cholesterol and blood lipids Have your blood tested for lipids and cholesterol at 71 years of age, then  have this test every 5 years. You may need to have your cholesterol levels checked more often if: Your lipid or cholesterol levels are high. You are older than 71 years of age. You are at high risk for heart disease. What should I know about cancer screening? Many types of cancers can be detected early and may often be prevented. Depending on your health history and family history, you may need to have cancer screening at various ages. This may include screening for: Colorectal cancer. Prostate cancer. Skin cancer. Lung cancer. What should I know about heart disease, diabetes, and high blood pressure? Blood pressure and heart disease High blood pressure causes heart disease and increases the risk of stroke. This is more likely to develop in people who have high blood pressure readings or are overweight. Talk with your health care provider about your target blood pressure readings. Have your blood pressure checked: Every 3-5 years if you are 15-25 years of age. Every year if you are 28 years old or older. If you are between the ages of 34 and 79 and are a current or former smoker, ask your health care provider if you should have a one-time screening for abdominal aortic aneurysm (AAA). Diabetes Have regular diabetes screenings. This checks your fasting blood sugar level. Have the screening done: Once every three years after age 85 if you are at a normal weight and have a low risk for diabetes. More often and at a younger age if you are overweight or have a high risk for diabetes. What should I know about preventing infection? Hepatitis B If you have a higher risk for hepatitis B, you should be screened for this virus. Talk with your health care provider to find out if you are at risk for hepatitis B infection. Hepatitis C Blood testing is recommended for: Everyone born from [birth year] through 1964. Anyone with known risk factors for hepatitis C. Sexually transmitted infections (STIs) You  should be screened each year for STIs, including gonorrhea and chlamydia, if: You are sexually active and are younger than 71 years of age. You are older than 71 years of age and your health care provider tells you that you are at risk for this type of infection. Your sexual activity has changed since you were last screened, and you are at increased risk for chlamydia or gonorrhea. Ask your health care provider if you are at risk. Ask your health care provider about whether you are at high risk for HIV. Your health care provider may recommend a prescription medicine to help prevent HIV infection. If you choose to take medicine to prevent HIV, you should first get tested for HIV. You should then be tested every 3 months for as long as you are taking the medicine. Follow these instructions at home: Alcohol use Do not drink alcohol if your health care provider tells you not to drink. If you drink alcohol: Limit how much you have to 0-2 drinks a day. Know how much alcohol is in your drink. In the [place] one drink equals one 12 oz bottle of beer (355 mL), one 5 oz glass of wine (148 mL), or one 1 oz glass of hard liquor (  44 mL). Lifestyle Do not use any products that contain nicotine or tobacco. These products include cigarettes, chewing tobacco, and vaping devices, such as e-cigarettes. If you need help quitting, ask your health care provider. Do not use street drugs. Do not share needles. Ask your health care provider for help if you need support or information about quitting drugs. General instructions Schedule regular health, dental, and eye exams. Stay current with your vaccines. Tell your health care provider if: You often feel depressed. You have ever been abused or do not feel safe at home. Summary Adopting a healthy lifestyle and getting preventive care are important in promoting health and wellness. Follow your health care provider's instructions about healthy diet, exercising, and  getting tested or screened for diseases. Follow your health care provider's instructions on monitoring your cholesterol and blood pressure. This information is not intended to replace advice given to you by your health care provider. Make sure you discuss any questions you have with your health care provider. Document Revised: 10/06/2020 Document Reviewed: 10/06/2020 Elsevier Patient Education  2023 [employer].

## 2022-09-20 NOTE — Progress Notes (Signed)
MEDICARE ANNUAL WELLNESS VISIT  09/20/2022  Telephone Visit Disclaimer This Medicare AWV was conducted by telephone due to national recommendations for restrictions regarding the COVID-19 Pandemic (e.g. social distancing).  I verified, using two identifiers, that I am speaking with Gregory Ready [name] or their authorized healthcare agent. I discussed the limitations, risks, security, and privacy concerns of performing an evaluation and management service by telephone and the potential availability of an in-person appointment in the future. The patient expressed understanding and agreed to proceed.  Location of Patient: Home Location of Provider (nurse):  In the office.  Subjective:    Gregory Ready [name] is a 72 y.o. male patient of [name], DO who had a Medicare Annual Wellness Visit today via telephone. Gregory West is Retired and lives with their spouse. he has 2 children. he reports that he is socially active and does interact with friends/family regularly. he is moderately physically active and enjoys reading the bible and fishing.  Patient Care Team: [name], DO as PCP - General (Family Medicine) Lyn Records, MD (Inactive) as PCP - Cardiology (Cardiology) Altheimer, Casimiro Needle, MD as Consulting Physician (Endocrinology) Aletha Halim, MD as Referring Physician (Specialist)     09/20/2022    8:13 AM 09/16/2021    8:09 AM 11/03/2020    9:39 AM 09/15/2020    9:13 AM 09/06/2019   10:36 AM 03/22/2019    8:39 PM 07/06/2018    2:43 PM  Advanced Directives  Does Patient Have a Medical Advance Directive? Yes Yes Yes No Yes Yes Yes  Type of Advance Directive Living will Healthcare Power of [place];Living will Healthcare Power of [place];Living will   Living will Living will;Healthcare Power of Attorney  Does patient want to make changes to medical advance directive? No - Patient declined No - Patient declined   No - Patient declined    Copy of Healthcare Power of  Attorney in Chart?  No - copy requested No - copy requested  No - copy requested    Would patient like information on creating a medical advance directive?    No - Patient declined       Hospital Utilization Over the Past 12 Months: # of hospitalizations or ER visits: 0 # of surgeries: 1  Review of Systems    Patient reports that his overall health is worse compared to last year.  History obtained from chart review and the patient  Patient Reported Readings (BP, Pulse, CBG, Weight, etc) none  Pain Assessment Pain : 0-10 Pain Score: 8  Pain Type: Chronic pain, Neuropathic pain Pain Location: Generalized (bilateral knees, shoulder, lower back) Pain Descriptors / Indicators: Aching, Numbness Pain Onset: More than a month ago Pain Frequency: Constant Pain Relieving Factors: rest  Pain Relieving Factors: rest  Current Medications & Allergies (verified) Allergies as of 09/20/2022       Reactions   Statins    Muscle cramping all over body. Lipitor, Crestor, and pravastatin.   Atorvastatin    Other reaction(s): "severe cramps in muscles and tendons"   Rosuvastatin    Other reaction(s): "severe cramps in muscles and tendons"   Liraglutide Other (See Comments)   Caused pancreatitis Other reaction(s): pancreas infection   Lisinopril Cough        Medication List        Accurate as of September 20, 2022  8:29 AM. If you have any questions, ask your nurse or doctor.          acetaminophen 650  MG CR tablet Commonly known as: TYLENOL Take 1 tablet (650 mg total) by mouth every 8 (eight) hours as needed for pain.   aspirin 81 MG tablet Take 81 mg by mouth daily.   carboxymethylcellulose 0.5 % Soln Commonly known as: REFRESH PLUS   celecoxib 200 MG capsule Commonly known as: CeleBREX One to 2 tablets by mouth daily as needed for pain.   clotrimazole-betamethasone cream Commonly known as: LOTRISONE Apply 1 application. topically 2 (two) times daily. X 10 days    Continuous Glucose Sensor Misc by Does not apply route. dexacom   cyanocobalamin 500 MCG tablet Commonly known as: VITAMIN B12   cyclobenzaprine 10 MG tablet Commonly known as: FLEXERIL   Dulaglutide 3 MG/0.5ML Sopn Inject 3 mg into the skin once a week.   empagliflozin 25 MG Tabs tablet Commonly known as: Jardiance Take 1 tablet (25 mg total) by mouth daily.   ezetimibe 10 MG tablet Commonly known as: ZETIA Take 1 tablet (10 mg total) by mouth daily.   Fish Oil 1000 MG Caps Take by mouth. Takes 3 capsules a day   furosemide 20 MG tablet Commonly known as: LASIX Take 20 mg by mouth daily. Patient takes it as needed. 09/15/20   insulin glargine-yfgn 100 UNIT/ML injection Commonly known as: SEMGLEE 30 Units daily.   latanoprost 0.005 % ophthalmic solution Commonly known as: XALATAN   levothyroxine 25 MCG tablet Commonly known as: SYNTHROID Take 25 mcg by mouth daily before breakfast.   losartan 25 MG tablet Commonly known as: COZAAR Take 1 tablet (25 mg total) by mouth daily.   metFORMIN 750 MG 24 hr tablet Commonly known as: GLUCOPHAGE-XR TAKE 1 TABLET TWICE A DAY BEFORE MEALS   omeprazole 40 MG capsule Commonly known as: PRILOSEC Take 1 capsule (40 mg total) by mouth daily.   OneTouch Verio test strip Generic drug: glucose blood 1 each by Other route as needed.   potassium chloride 10 MEQ CR capsule Commonly known as: MICRO-K   propranolol ER 60 MG 24 hr capsule Commonly known as: INDERAL LA Take 1 capsule (60 mg total) by mouth daily.   rosuvastatin 5 MG tablet Commonly known as: CRESTOR Take 1 tablet (5 mg total) by mouth daily.   tamsulosin 0.4 MG Caps capsule Commonly known as: FLOMAX Take 1 capsule (0.4 mg total) by mouth daily.   traZODone 50 MG tablet Commonly known as: DESYREL TAKE ONE-HALF (1/2) TO ONE TABLET AT BEDTIME AS NEEDED FOR SLEEP   Vitamin D3 125 MCG (5000 UT) Caps Take 1 capsule by mouth daily.        History  (reviewed): Past Medical History:  Diagnosis Date   Allergy not sure   Arthritis    Chest pain    a. Normal cors 2001. b. Neg stress test 2012, 05/2014.   Diabetes mellitus without complication    GERD (gastroesophageal reflux disease)    Hyperlipidemia    Hypertension    Hypertensive response to exercise    Hypothyroidism    Obesity    Obstructive sleep apnea    Currently untreated    PONV (postoperative nausea and vomiting)    Sleep apnea    Past Surgical History:  Procedure Laterality Date   CARDIAC CATHETERIZATION     CERVICAL FUSION     HERNIA REPAIR     KNEE ARTHROSCOPY  1984   both knee   [facility]   SHOULDER ARTHROSCOPY WITH SUBACROMIAL DECOMPRESSION Right 09/13/2019  Procedure: RIGHT SHOULDER ARTHROSCOPY WITH EXTENSIVE DEBRIDEMENT, SUBACROMIAL DECOMPRESSION,;  Surgeon: Kathryne Hitch, MD;  Location: [facility];  Service: Orthopedics;  Laterality: Right;   SPINE SURGERY  1st Sept 1977   2nd Feb 2011   Family History  Problem Relation Age of Onset   Diabetes Mother    Heart disease Mother    Hypertension Mother    Stroke Mother    Heart attack Mother    COPD Mother    Cancer Father    Heart disease Father    Diabetes Father    Hyperlipidemia Sister    Hypertension Sister    Diabetes Maternal Grandmother    Stroke Maternal Grandmother    Heart attack Maternal Grandmother    Heart disease Maternal Grandmother    Diabetes Maternal Grandfather    Hypertension Maternal Grandfather    Heart attack Maternal Grandfather    Heart disease Maternal Grandfather    Diabetes Sister    Hypertension Sister    Cancer Sister    Hyperlipidemia Sister    Heart attack Paternal Grandmother    Heart attack Paternal Grandfather    Cancer Sister    Hyperlipidemia Sister    COPD Maternal Aunt    Hypertension Maternal Aunt    Diabetes Son    Diabetes Maternal Aunt    Social History   Socioeconomic History   Marital  status: Married    Spouse name: [name]   Number of children: 2   Years of education: 16   Highest education level: Bachelor's degree (e.g., BA, AB, BS)  Occupational History    Comment: Retired  Tobacco Use   Smoking status: Former    Packs/day: 1.50    Years: 30.00    Additional pack years: 0.00    Total pack years: 45.00    Types: Cigarettes    Quit date: 05/31/1993    Years since quitting: 29.3   Smokeless tobacco: Never   Tobacco comments:    24 years smoke free and alcohol free  Vaping Use   Vaping Use: Never used  Substance and Sexual Activity   Alcohol use: No   Drug use: No   Sexual activity: Yes    Birth control/protection: None    Comment: Not Necessary  Other Topics Concern   Not on file  Social History Narrative   Lives with his wife. He has two children and three grand children. He likes to read his bible and go fishing in his free time.    Social Determinants of Health   Financial Resource Strain: Low Risk  (09/16/2022)   Overall Financial Resource Strain (CARDIA)    Difficulty of Paying Living Expenses: Not very hard  Food Insecurity: Food Insecurity Present (09/16/2022)   Hunger Vital Sign    Worried About Running Out of Food in the Last Year: Sometimes true    Ran Out of Food in the Last Year: Never true  Transportation Needs: No Transportation Needs (09/16/2022)   PRAPARE - Administrator, Civil Service (Medical): No    Lack of Transportation (Non-Medical): No  Physical Activity: Sufficiently Active (09/16/2022)   Exercise Vital Sign    Days of Exercise per Week: 3 days    Minutes of Exercise per Session: 90 min  Recent Concern: Physical Activity - Insufficiently Active (09/06/2022)   Exercise Vital Sign    Days of Exercise per Week: 3 days    Minutes of Exercise per Session: 30 min  Stress: No Stress Concern  Present (09/16/2022)   [employer] of Occupational Health - Occupational Stress Questionnaire    Feeling of Stress : Only a  little  Recent Concern: Stress - Stress Concern Present (09/06/2022)   [employer] of Occupational Health - Occupational Stress Questionnaire    Feeling of Stress : To some extent  Social Connections: Socially Integrated (09/20/2022)   Social Connection and Isolation Panel [NHANES]    Frequency of Communication with Friends and Family: Three times a week    Frequency of Social Gatherings with Friends and Family: Three times a week    Attends Religious Services: More than 4 times per year    Active Member of Clubs or Organizations: Yes    Attends Banker Meetings: More than 4 times per year    Marital Status: Married    Activities of Daily Living    09/16/2022    4:09 PM  In your present state of health, do you have any difficulty performing the following activities:  Hearing? 0  Vision? 1  Difficulty concentrating or making decisions? 0  Walking or climbing stairs? 1  Dressing or bathing? 0  Doing errands, shopping? 0  Preparing Food and eating ? N  Using the Toilet? N  In the past six months, have you accidently leaked urine? Y  Do you have problems with loss of bowel control? N  Managing your Medications? Y  Managing your Finances? N  Housekeeping or managing your Housekeeping? N    Patient Education/ Literacy How often do you need to have someone help you when you read instructions, pamphlets, or other written materials from your doctor or pharmacy?: 2 - Rarely What is the last grade level you completed in school?: bachelors degree  Exercise Current Exercise Habits: Home exercise routine, Type of exercise: strength training/weights;walking, Time (Minutes): > 60, Frequency (Times/Week): 3, Weekly Exercise (Minutes/Week): 0, Intensity: Moderate, Exercise limited by: orthopedic condition(s)  Diet Patient reports consuming  2-3  meals a day and 1 snack(s) a day Patient reports that his primary diet is: Regular Patient reports that she does have regular access  to food.   Depression Screen    09/20/2022    8:13 AM 09/08/2022    8:23 AM 09/16/2021    8:09 AM 09/15/2021   10:32 AM 07/29/2021    8:59 AM 09/15/2020    9:14 AM 08/02/2019    8:20 AM  PHQ 2/9 Scores  PHQ - 2 Score 0 0 0 0 0 0   Exception Documentation       Patient refusal     Fall Risk    09/20/2022    8:13 AM 09/16/2022    4:09 PM 09/08/2022    8:23 AM 09/15/2021   10:32 AM 09/13/2021    8:40 AM  Fall Risk   Falls in the past year? 0 0 0 0 0  Number falls in past yr: 0  0 0 0  Injury with Fall? 0  0 0 0  Risk for fall due to : No Fall Risks  No Fall Risks No Fall Risks No Fall Risks  Follow up Falls evaluation completed  Falls evaluation completed Falls prevention discussed Falls evaluation completed     Objective:  Gregory Ready [name] seemed alert and oriented and he participated appropriately during our telephone visit.  Blood Pressure Weight BMI  BP Readings from Last 3 Encounters:  09/14/22 121/80  09/08/22 117/70  09/03/22 116/80   Wt Readings from Last 3 Encounters:  09/14/22  263 lb (119.3 kg)  09/08/22 269 lb (122 kg)  09/03/22 266 lb 3.2 oz (120.7 kg)   BMI Readings from Last 1 Encounters:  09/14/22 38.28 kg/m    *Unable to obtain current vital signs, weight, and BMI due to telephone visit type  Hearing/Vision  Gregory West did not seem to have difficulty with hearing/understanding during the telephone conversation Reports that he has had a formal eye exam by an eye care professional within the past year Reports that he has not had a formal hearing evaluation within the past year *Unable to fully assess hearing and vision during telephone visit type  Cognitive Function:    09/20/2022    8:23 AM 09/16/2021    8:16 AM 09/15/2020    9:21 AM  6CIT Screen  What Year? 0 points 0 points 0 points  What month? 0 points 0 points 0 points  What time? 0 points 0 points 0 points  Count back from 20 0 points 0 points 0 points  Months in reverse 0 points 0 points 0  points  Repeat phrase 0 points 0 points 0 points  Total Score 0 points 0 points 0 points   (Normal:0-7, Significant for Dysfunction: >8)  Normal Cognitive Function Screening: Yes   Immunization & Health Maintenance Record Immunization History  Administered Date(s) Administered   Fluad Quad(high Dose 65+) 01/31/2019, 02/01/2020, 01/29/2021, 03/05/2022   Influenza Split 02/13/2012, 03/06/2014   Influenza, High Dose Seasonal PF 01/21/2017, 01/31/2019   Influenza,inj,Quad PF,6+ Mos 04/06/2011, 01/12/2018   Influenza-Unspecified 03/31/2010, 02/09/2012, 03/06/2014, 02/28/2015, 03/14/2016, 01/21/2017, 01/29/2017, 02/02/2019, 01/30/2020, 02/06/2020   PFIZER(Purple Top)SARS-COV-2 Vaccination 08/03/2019, 08/19/2019, 08/31/2019   Pneumococcal Conjugate-13 01/21/2017   Pneumococcal Polysaccharide-23 09/07/2000, 07/31/2015, 02/13/2020   Pneumococcal-Unspecified 08/29/2009   Td 04/10/2001   Tdap 07/07/2011, 01/29/2013, 07/31/2015   Zoster Recombinat (Shingrix) 10/29/2020, 05/13/2021    Health Maintenance  Topic Date Due   COVID-19 Vaccine (4 - 2023-24 season) 03/26/2023 (Originally 01/29/2022)   Diabetic kidney evaluation - Urine ACR  11/12/2022   HEMOGLOBIN A1C  12/21/2022   INFLUENZA VACCINE  12/30/2022   Diabetic kidney evaluation - eGFR measurement  01/30/2023   OPHTHALMOLOGY EXAM  06/12/2023   FOOT EXAM  09/08/2023   Medicare Annual Wellness (AWV)  09/20/2023   DTaP/Tdap/Td (5 - Td or Tdap) 07/30/2025   COLONOSCOPY (Pts 45-69yrs Insurance coverage will need to be confirmed)  02/23/2026   Pneumonia Vaccine 9+ Years old  Completed   Hepatitis C Screening  Completed   Zoster Vaccines- Shingrix  Completed   HPV VACCINES  Aged Out       Assessment  This is a routine wellness examination for Gregory WestMarland Kitchen  Health Maintenance: Due or Overdue There are no preventive care reminders to display for this patient.   Gregory Governor Rooks [name] does not need a referral for  Community Assistance: Care Management:   no Social Work:    no Prescription Assistance:  no Nutrition/Diabetes Education:  no   Plan:  Personalized Goals  Goals Addressed               This Visit's Progress     Patient Stated (pt-stated)        Patient stated that he would like to loose 20 lbs.       Personalized Health Maintenance & Screening Recommendations  There are no preventive care reminders to display for this patient.  Lung Cancer Screening Recommended: no (Low Dose CT Chest recommended if Age 74-80 years, 30 pack-year currently smoking  OR have quit w/in past 15 years) Hepatitis C Screening recommended: no HIV Screening recommended: no  Advanced Directives: Written information was not prepared per patient's request.  Referrals & Orders No orders of the defined types were placed in this encounter.   Follow-up Plan Follow-up with [name], DO as planned Medicare wellness visit in one year.  Patient will access AVS on my chart.   I have personally reviewed and noted the following in the patient's chart:   Medical and social history Use of alcohol, tobacco or illicit drugs  Current medications and supplements Functional ability and status Nutritional status Physical activity Advanced directives List of other physicians Hospitalizations, surgeries, and ER visits in previous 12 months Vitals Screenings to include cognitive, depression, and falls Referrals and appointments  In addition, I have reviewed and discussed with Gregory Ready [name] certain preventive protocols, quality metrics, and best practice recommendations. A written personalized care plan for preventive services as well as general preventive health recommendations is available and can be mailed to the patient at his request.      Modesto Charon, RN BSN  09/20/2022

## 2022-09-27 ENCOUNTER — Telehealth: Payer: Self-pay | Admitting: Orthopaedic Surgery

## 2022-09-27 ENCOUNTER — Ambulatory Visit: Payer: Medicare Other | Admitting: Orthopaedic Surgery

## 2022-09-27 NOTE — Telephone Encounter (Signed)
Patient advising that he has not received his MRI ora a call about the scheduling of it. Please adavise

## 2022-09-27 NOTE — Telephone Encounter (Signed)
Per chart, MRI has been authorized and a message was left for patient to schedule. I gave number to [facility] for him to schedule MRI. He will call back to let us know when he is having the scan so that we can get him a follow up appointment with Dr. Ophelia Charter to review.

## 2022-09-29 ENCOUNTER — Encounter: Payer: Self-pay | Admitting: Family Medicine

## 2022-09-29 ENCOUNTER — Ambulatory Visit (INDEPENDENT_AMBULATORY_CARE_PROVIDER_SITE_OTHER): Payer: Medicare Other | Admitting: Family Medicine

## 2022-09-29 VITALS — BP 118/75 | HR 73 | Ht 69.5 in | Wt 268.0 lb

## 2022-09-29 DIAGNOSIS — N529 Male erectile dysfunction, unspecified: Secondary | ICD-10-CM | POA: Diagnosis not present

## 2022-09-29 DIAGNOSIS — L989 Disorder of the skin and subcutaneous tissue, unspecified: Secondary | ICD-10-CM

## 2022-09-29 DIAGNOSIS — L819 Disorder of pigmentation, unspecified: Secondary | ICD-10-CM | POA: Diagnosis not present

## 2022-09-29 NOTE — Assessment & Plan Note (Signed)
Will do a trial off of propranolol to see if this helps with symptoms without significant worsening of his tremor.

## 2022-09-29 NOTE — Patient Instructions (Signed)
Try holding the propranolol for a few days and see if ED symptoms improve.

## 2022-09-29 NOTE — Progress Notes (Signed)
Gregory Ready [name] - 72 y.o. male MRN [id number]  Date of birth: [birth year]  Subjective Chief Complaint  Patient presents with   Diabetes   Bruise    HPI Gregory AVERSA [name] is a 72 year old male here today for follow-up.  Seen about a month ago for hypopigmented area on the right upper chest wall.  Area with appearance of bruise at that time.  He reports the area has continued to get a little bit larger.  He denies any pain overlying the area or itching.  He is having some issues with ADD.  He has been seeing his physician through the [place] to help with this.  He has tried sildenafil as well as tadalafil without much improvement.  He thinks some of the medication may be contributing.  He is on propranolol for tremor.    ROS:  A comprehensive ROS was completed and negative except as noted per HPI  Allergies  Allergen Reactions   Statins     Muscle cramping all over body. Lipitor, Crestor, and pravastatin.   Atorvastatin     Other reaction(s): "severe cramps in muscles and tendons"   Rosuvastatin     Other reaction(s): "severe cramps in muscles and tendons"   Liraglutide Other (See Comments)    Caused pancreatitis Other reaction(s): pancreas infection   Lisinopril Cough    Past Medical History:  Diagnosis Date   Allergy not sure   Arthritis    Chest pain    a. Normal cors 2001. b. Neg stress test 2012, 05/2014.   Diabetes mellitus without complication (HCC)    GERD (gastroesophageal reflux disease)    Hyperlipidemia    Hypertension    Hypertensive response to exercise    Hypothyroidism    Obesity    Obstructive sleep apnea    Currently untreated    PONV (postoperative nausea and vomiting)    Sleep apnea     Past Surgical History:  Procedure Laterality Date   CARDIAC CATHETERIZATION     CERVICAL FUSION     HERNIA REPAIR     KNEE ARTHROSCOPY  1984   both knee   [facility]   SHOULDER ARTHROSCOPY WITH SUBACROMIAL DECOMPRESSION  Right 09/13/2019   Procedure: RIGHT SHOULDER ARTHROSCOPY WITH EXTENSIVE DEBRIDEMENT, SUBACROMIAL DECOMPRESSION,;  Surgeon: Kathryne Hitch, MD;  Location: [facility];  Service: Orthopedics;  Laterality: Right;   SPINE SURGERY  1st Sept 1977   2nd Feb 2011    Social History   Socioeconomic History   Marital status: Married    Spouse name: Gregory West   Number of children: 2   Years of education: 16   Highest education level: Bachelor's degree (e.g., BA, AB, BS)  Occupational History    Comment: Retired  Tobacco Use   Smoking status: Former    Packs/day: 1.50    Years: 30.00    Additional pack years: 0.00    Total pack years: 45.00    Types: Cigarettes    Quit date: 05/31/1993    Years since quitting: 29.3   Smokeless tobacco: Never   Tobacco comments:    24 years smoke free and alcohol free  Vaping Use   Vaping Use: Never used  Substance and Sexual Activity   Alcohol use: No   Drug use: No   Sexual activity: Yes    Birth control/protection: None    Comment: Not Necessary  Other Topics Concern   Not on file  Social History Narrative  Lives with his wife. He has two children and three grand children. He likes to read his bible and go fishing in his free time.    Social Determinants of Health   Financial Resource Strain: Low Risk  (09/16/2022)   Overall Financial Resource Strain (CARDIA)    Difficulty of Paying Living Expenses: Not very hard  Food Insecurity: Food Insecurity Present (09/16/2022)   Hunger Vital Sign    Worried About Running Out of Food in the Last Year: Sometimes true    Ran Out of Food in the Last Year: Never true  Transportation Needs: No Transportation Needs (09/16/2022)   PRAPARE - Administrator, Civil Service (Medical): No    Lack of Transportation (Non-Medical): No  Physical Activity: Sufficiently Active (09/16/2022)   Exercise Vital Sign    Days of Exercise per Week: 3 days    Minutes of Exercise per Session: 90  min  Recent Concern: Physical Activity - Insufficiently Active (09/06/2022)   Exercise Vital Sign    Days of Exercise per Week: 3 days    Minutes of Exercise per Session: 30 min  Stress: No Stress Concern Present (09/16/2022)   [employer] of Occupational Health - Occupational Stress Questionnaire    Feeling of Stress : Only a little  Recent Concern: Stress - Stress Concern Present (09/06/2022)   [employer] of Occupational Health - Occupational Stress Questionnaire    Feeling of Stress : To some extent  Social Connections: Socially Integrated (09/20/2022)   Social Connection and Isolation Panel [NHANES]    Frequency of Communication with Friends and Family: Three times a week    Frequency of Social Gatherings with Friends and Family: Three times a week    Attends Religious Services: More than 4 times per year    Active Member of Clubs or Organizations: Yes    Attends Engineer, structural: More than 4 times per year    Marital Status: Married    Family History  Problem Relation Age of Onset   Diabetes Mother    Heart disease Mother    Hypertension Mother    Stroke Mother    Heart attack Mother    COPD Mother    Cancer Father    Heart disease Father    Diabetes Father    Hyperlipidemia Sister    Hypertension Sister    Diabetes Maternal Grandmother    Stroke Maternal Grandmother    Heart attack Maternal Grandmother    Heart disease Maternal Grandmother    Diabetes Maternal Grandfather    Hypertension Maternal Grandfather    Heart attack Maternal Grandfather    Heart disease Maternal Grandfather    Diabetes Sister    Hypertension Sister    Cancer Sister    Hyperlipidemia Sister    Heart attack Paternal Grandmother    Heart attack Paternal Grandfather    Cancer Sister    Hyperlipidemia Sister    COPD Maternal Aunt    Hypertension Maternal Aunt    Diabetes Son    Diabetes Maternal Aunt     Health Maintenance  Topic Date Due   COVID-19 Vaccine (5 -  2023-24 season) 03/26/2023 (Originally 01/29/2022)   Diabetic kidney evaluation - Urine ACR  11/12/2022   HEMOGLOBIN A1C  12/21/2022   INFLUENZA VACCINE  12/30/2022   Diabetic kidney evaluation - eGFR measurement  01/30/2023   OPHTHALMOLOGY EXAM  06/12/2023   FOOT EXAM  09/08/2023   Medicare Annual Wellness (AWV)  09/20/2023   DTaP/Tdap/Td (  5 - Td or Tdap) 07/30/2025   COLONOSCOPY (Pts 45-38yrs Insurance coverage will need to be confirmed)  02/23/2026   Pneumonia Vaccine 83+ Years old  Completed   Hepatitis C Screening  Completed   Zoster Vaccines- Shingrix  Completed   HPV VACCINES  Aged Out     ----------------------------------------------------------------------------------------------------------------------------------------------------------------------------------------------------------------- Physical Exam BP 118/75 (BP Location: Left Arm, Patient Position: Sitting, Cuff Size: Large)   Pulse 73   Ht 5' 9.5" (1.765 m)   Wt 268 lb (121.6 kg)   SpO2 99%   BMI 39.01 kg/m   Physical Exam Constitutional:      Appearance: Normal appearance.  HENT:     Head: Normocephalic and atraumatic.  Eyes:     General: No scleral icterus. Skin:    Comments: Hyperpigmented lesion to R upper chest wall.  Non-tender.  Neurological:     General: No focal deficit present.     Mental Status: He is alert.  Psychiatric:        Mood and Affect: Mood normal.        Behavior: Behavior normal.     ------------------------------------------------------------------------------------------------------------------------------------------------------------------------------------------------------------------- Assessment and Plan  Atypical pigmented lesion This area has appearance of postinflammatory hyperpigmentation.  If it was a bruise I would think it would have resolved by this point.  Referral placed to dermatology.  ED (erectile dysfunction) Will do a trial off of propranolol to see if  this helps with symptoms without significant worsening of his tremor.   No orders of the defined types were placed in this encounter.   No follow-ups on file.    This visit occurred during the SARS-CoV-2 public health emergency.  Safety protocols were in place, including screening questions prior to the visit, additional usage of staff PPE, and extensive cleaning of exam room while observing appropriate contact time as indicated for disinfecting solutions.

## 2022-09-29 NOTE — Assessment & Plan Note (Signed)
This area has appearance of postinflammatory hyperpigmentation.  If it was a bruise I would think it would have resolved by this point.  Referral placed to dermatology.

## 2022-10-02 ENCOUNTER — Ambulatory Visit (INDEPENDENT_AMBULATORY_CARE_PROVIDER_SITE_OTHER): Payer: Medicare Other

## 2022-10-02 DIAGNOSIS — M542 Cervicalgia: Secondary | ICD-10-CM | POA: Diagnosis not present

## 2022-10-02 DIAGNOSIS — R200 Anesthesia of skin: Secondary | ICD-10-CM | POA: Diagnosis not present

## 2022-10-05 ENCOUNTER — Encounter: Payer: Self-pay | Admitting: Orthopaedic Surgery

## 2022-10-05 ENCOUNTER — Ambulatory Visit (INDEPENDENT_AMBULATORY_CARE_PROVIDER_SITE_OTHER): Payer: Medicare Other | Admitting: Orthopaedic Surgery

## 2022-10-05 VITALS — BP 121/73 | Ht 69.5 in | Wt 268.0 lb

## 2022-10-05 DIAGNOSIS — R200 Anesthesia of skin: Secondary | ICD-10-CM | POA: Diagnosis not present

## 2022-10-05 DIAGNOSIS — M542 Cervicalgia: Secondary | ICD-10-CM

## 2022-10-05 DIAGNOSIS — M4322 Fusion of spine, cervical region: Secondary | ICD-10-CM

## 2022-10-05 NOTE — Progress Notes (Unsigned)
Office Visit Note   Patient: Gregory VERDERAME [name]           Date of Birth: 1951/05/19           MRN: [id number] Visit Date: 10/05/2022              Requested by: [name], DO [address] [place],  [place] [address] PCP: [name], DO   Assessment & Plan: Visit Diagnoses:  1. Neck pain   2. Fusion of spine of cervical region   3. Bilateral hand numbness     Plan: Cervical MRI scan shows no areas of severe stenosis.  Would recommend proceeding with EMGs nerve conduction velocities evaluating with carpal tunnel versus ulnar compression at the elbow.  He does have some known left foraminal stenosis but his symptoms involve whole hand numbness.  Office follow-up after EMG nerve conduction velocities to rule out carpal tunnel syndrome and ulnar compression at the elbow.  Follow-Up Instructions: No follow-ups on file.   Orders:  Orders Placed This Encounter  Procedures   Ambulatory referral to Physical Medicine Rehab   No orders of the defined types were placed in this encounter.     Procedures: No procedures performed   Clinical Data: No additional findings.   Subjective: Chief Complaint  Patient presents with   Neck - Pain, Follow-up    MRI cervical spine review    HPI 72 year old male returns post MRI scan.  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  He has had problems with bilateral hand numbness.  No myelopathic gait problems.  He said difficulty with rotation of his neck and pain radiates down more on the left side of his neck and his shoulder down to his hand making his hand numb more left hand than right hand.  Additional problems with diabetes, neuropathy, GERD, shoulder impingement and some knee arthritis.  New MRI scan has been obtained and report shows unchanged myelopathic changes at C3-4 and some moderate narrowing below the  fusion and some mild to moderate foraminal stenosis above his solid fusions.  Review of Systems all systems updated unchanged from 09/14/2022.   Objective: Vital Signs: BP 121/73   Ht 5' 9.5" (1.765 m)   Wt 268 lb (121.6 kg)   BMI 39.01 kg/m   Physical Exam Constitutional:      Appearance: He is well-developed.  HENT:     Head: Normocephalic and atraumatic.     Right Ear: External ear normal.     Left Ear: External ear normal.  Eyes:     Pupils: Pupils are equal, round, and reactive to light.  Neck:     Thyroid: No thyromegaly.     Trachea: No tracheal deviation.  Cardiovascular:     Rate and Rhythm: Normal rate.  Pulmonary:     Effort: Pulmonary effort is normal.     Breath sounds: No wheezing.  Abdominal:     General: Bowel sounds are normal.     Palpations: Abdomen is soft.  Musculoskeletal:     Cervical back: Neck supple.  Skin:    General: Skin is warm and dry.     Capillary Refill: Capillary refill takes less than 2 seconds.  Neurological:     Mental Status: He is alert and oriented to person, place, and time.  Psychiatric:        Behavior: Behavior normal.  Thought Content: Thought content normal.        Judgment: Judgment normal.     Ortho Exam patient has some discomfort with carpal compression negative Phalen's positive Tinel's ulnar nerve is stable at the elbow right and left.  Upper extremity reflexes are trace to 1+ and symmetrical.  No lower extremity clonus no hyperreflexia.  Normal heel-toe gait.  Specialty Comments:  No specialty comments available.  Imaging: Narrative & Impression  CLINICAL DATA:  Neck pain with left-sided numbness. History of cervical fusion.   EXAM: MRI CERVICAL SPINE WITHOUT CONTRAST   TECHNIQUE: Multiplanar, multisequence MR imaging of the cervical spine was performed. No intravenous contrast was administered.   COMPARISON:  Cervical spine radiographs 09/14/2022, cervical spine MRI 06/28/2009 (report only,  images not available.   FINDINGS: Alignment: There is a reversal of the normal cervical lordosis. There is no antero or retrolisthesis.   Vertebrae: Vertebral body heights are preserved, without evidence of acute injury. Background marrow signal is normal. There is no suspicious marrow signal abnormality or marrow edema. Postsurgical changes reflecting ACDF from C3 through C5 as well as prior fusion at C5 through C7 are noted.   Cord: There are small foci of signal abnormality in the right and left aspect of the cord at C3-C4 likely reflecting myelomalacia. There is additional signal abnormality in the central cord at T2 on the axial images (5-38). This is incompletely imaged on the axial images but appears to extend to the T3 level on the sagittal STIR sequence (4-8).   Posterior Fossa, vertebral arteries, paraspinal tissues: The imaged posterior fossa is unremarkable. The vertebral artery flow voids are normal. The paraspinal soft tissues are unremarkable.   Disc levels:   C2-C3: There is mild facet arthropathy with ligamentum flavum thickening but no significant spinal canal or neural foraminal stenosis   C3-C4: Status post ACDF without residual spinal canal or neural foraminal stenosis   C4-C5: Status post ACDF with suspected mild right neural foraminal stenosis due to endplate spurring and facet arthropathy. No significant spinal canal or left neural foraminal stenosis   C5-C6: Prior fusion without residual spinal canal or neural foraminal stenosis   C6-C7: Prior fusion without residual spinal canal or neural foraminal stenosis.   C7-T1: There is a disc bulge, endplate spurring, and bilateral facet arthropathy with ligamentum flavum thickening resulting in moderate spinal canal stenosis and severe bilateral neural foraminal stenosis   T1-T2: There is a broad-based disc protrusion and bilateral facet arthropathy resulting in mild spinal canal stenosis and  mild bilateral neural foraminal stenosis.   IMPRESSION: 1. Status post ACDF at C3 through C5 with additional prior fusion at C5 through C7. No significant residual spinal canal or neural foraminal stenosis at the fused levels. 2. 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 3. Mild spinal canal stenosis and mild bilateral neural foraminal stenosis at T1-T2 due to a disc protrusion and bilateral facet arthropathy. 4. Small foci of cord signal abnormality at C3-C4 consistent with myelomalacia. 5. Additional central cord signal abnormality at T2 is incompletely evaluated but could reflect a small syrinx. Edema or myelomalacia are considered less likely given lack of cord compression. This could be further evaluated with dedicated thoracic spine MRI if indicated.     Electronically Signed   By: Lesia Hausen M.D.   On: 10/05/2022 08:36     PMFS History: Patient Active Problem List   Diagnosis Date Noted   Bilateral hand  numbness 10/06/2022   Atypical pigmented lesion 09/29/2022   ED (erectile dysfunction) 09/29/2022   Fusion of spine of cervical region 09/14/2022   Bruising 09/08/2022   Acute tear of posterior aspect of lateral meniscus of left knee 01/29/2022   Bilateral third flexor stenosing tenosynovitis and first carpometacarpal osteoarthritis. 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   Hypertension associated with diabetes (HCC) 07/13/2017   Benign essential tremor 07/13/2017   Kidney cyst, acquired 01/24/2017   Seborrheic  keratoses 04/01/2016   Diverticulosis of colon without hemorrhage 02/26/2016   Anxiety state 10/02/2015   BPH (benign prostatic hyperplasia) 07/31/2015   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 Normal cors 2001. b. Neg stress test 2012, 05/2014.   Diabetes mellitus without complication (HCC)    GERD (gastroesophageal reflux disease)    Hyperlipidemia    Hypertension    Hypertensive response to exercise    Hypothyroidism    Obesity    Obstructive sleep apnea    Currently untreated    PONV (postoperative nausea and vomiting)    Sleep apnea     Family History  Problem Relation Age of Onset   Diabetes Mother    Heart disease Mother    Hypertension Mother    Stroke Mother    Heart attack Mother    COPD Mother    Cancer Father    Heart disease Father    Diabetes Father    Hyperlipidemia Sister    Hypertension Sister    Diabetes Maternal Grandmother    Stroke Maternal Grandmother    Heart attack Maternal Grandmother    Heart disease Maternal Grandmother    Diabetes Maternal Grandfather    Hypertension Maternal Grandfather    Heart attack Maternal Grandfather    Heart disease Maternal Grandfather    Diabetes Sister    Hypertension Sister    Cancer Sister    Hyperlipidemia Sister    Heart attack Paternal Grandmother    Heart attack Paternal Grandfather    Cancer Sister    Hyperlipidemia Sister    COPD Maternal Aunt    Hypertension Maternal Aunt    Diabetes Son    Diabetes Maternal Aunt     Past Surgical History:  Procedure Laterality Date   CARDIAC CATHETERIZATION     CERVICAL FUSION      HERNIA REPAIR     KNEE ARTHROSCOPY  1984   both knee   [facility]   SHOULDER ARTHROSCOPY WITH SUBACROMIAL DECOMPRESSION Right 09/13/2019   Procedure: RIGHT SHOULDER ARTHROSCOPY WITH EXTENSIVE DEBRIDEMENT, SUBACROMIAL DECOMPRESSION,;  Surgeon: Kathryne Hitch, MD;  Location: [facility];  Service: Orthopedics;  Laterality: Right;   SPINE SURGERY  1st Sept 1977   2nd Feb 2011   Social History   Occupational History  Comment: Retired  Tobacco Use   Smoking status: Former    Packs/day: 1.50    Years: 30.00    Additional pack years: 0.00    Total pack years: 45.00    Types: Cigarettes    Quit date: 05/31/1993    Years since quitting: 29.3   Smokeless tobacco: Never   Tobacco comments:    24 years smoke free and alcohol free  Vaping Use   Vaping Use: Never used  Substance and Sexual Activity   Alcohol use: No   Drug use: No   Sexual activity: Yes    Birth control/protection: None    Comment: Not Necessary

## 2022-10-06 ENCOUNTER — Encounter: Payer: Self-pay | Admitting: Orthopaedic Surgery

## 2022-10-06 ENCOUNTER — Ambulatory Visit (INDEPENDENT_AMBULATORY_CARE_PROVIDER_SITE_OTHER): Payer: Medicare Other | Admitting: Sports Medicine

## 2022-10-06 ENCOUNTER — Other Ambulatory Visit (INDEPENDENT_AMBULATORY_CARE_PROVIDER_SITE_OTHER): Payer: Medicare Other

## 2022-10-06 DIAGNOSIS — R200 Anesthesia of skin: Secondary | ICD-10-CM | POA: Diagnosis not present

## 2022-10-06 DIAGNOSIS — M79641 Pain in right hand: Secondary | ICD-10-CM

## 2022-10-06 DIAGNOSIS — R202 Paresthesia of skin: Secondary | ICD-10-CM | POA: Diagnosis not present

## 2022-10-06 DIAGNOSIS — M79642 Pain in left hand: Secondary | ICD-10-CM

## 2022-10-06 NOTE — Assessment & Plan Note (Signed)
History of trigger fingers, last right third flexor tendon sheath injection was in August 2023, he did well but having increasing triggering right third finger. We injected his third flexor tendon sheath today. Return as needed. He also has some thumb basal joint arthritis that was injected in the past, minimally painful but not enough to inject today.

## 2022-10-06 NOTE — Progress Notes (Signed)
    Procedures performed today:    Procedure: Real-time Ultrasound Guided injection of the right third flexor tendon sheath Device: Samsung HS60  Verbal informed consent obtained.  Time-out conducted.  Noted no overlying erythema, induration, or other signs of local infection.  Skin prepped in a sterile fashion.  Local anesthesia: Topical Ethyl chloride.  With sterile technique and under real time ultrasound guidance: Flexor tendon nodule noted, 1/2 cc lidocaine, 1/2 cc kenalog 40 injected easily Completed without difficulty  Advised to call if fevers/chills, erythema, induration, drainage, or persistent bleeding.  Images permanently stored and available for review in PACS.  Impression: Technically successful ultrasound guided injection.  Independent interpretation of notes and tests performed by another provider:   None.  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 History of trigger fingers, last right third flexor tendon sheath injection was in August 2023, he did well but having increasing triggering right third finger. We injected his third flexor tendon sheath today. Return as needed. He also has some thumb basal joint arthritis that was injected in the past, minimally painful but not enough to inject today.  Numbness and tingling in left hand Chronic numbness and tingling left hand, he describes the entire hand, he does have a positive Tinel's sign with a negative Phalen sign. Numbness and tingling is random through the day. He does have a history of a multilevel cervical fusion with adjacent level disease C7-T1 with foraminal and central stenosis on a recent MRI. Thus his symptomatology could be due to carpal tunnel syndrome or cervical radiculitis. He is comanaged with [facility], it sounds like a nerve conduction study is planned, he will do some nighttime  splinting and carpal tunnel conditioning initially, and we will await the results of the nerve conduction study.    ____________________________________________ Ihor Austin. Benjamin Stain, M.D., ABFM., CAQSM., AME. Primary Care and Sports Medicine [facility]  Adjunct Professor of Family Medicine  [place] [employer] [facility] [employer] [facility] [employer] [employer]  Restaurant manager, fast food

## 2022-10-06 NOTE — Assessment & Plan Note (Signed)
Chronic numbness and tingling left hand, he describes the entire hand, he does have a positive Tinel's sign with a negative Phalen sign. Numbness and tingling is random through the day. He does have a history of a multilevel cervical fusion with adjacent level disease C7-T1 with foraminal and central stenosis on a recent MRI. Thus his symptomatology could be due to carpal tunnel syndrome or cervical radiculitis. He is comanaged with [facility], it sounds like a nerve conduction study is planned, he will do some nighttime splinting and carpal tunnel conditioning initially, and we will await the results of the nerve conduction study.

## 2022-10-13 ENCOUNTER — Encounter: Payer: Self-pay | Admitting: Physical Medicine and Rehabilitation

## 2022-10-13 ENCOUNTER — Ambulatory Visit (INDEPENDENT_AMBULATORY_CARE_PROVIDER_SITE_OTHER): Payer: Medicare Other | Admitting: Physical Medicine and Rehabilitation

## 2022-10-13 DIAGNOSIS — R202 Paresthesia of skin: Secondary | ICD-10-CM | POA: Diagnosis not present

## 2022-10-13 DIAGNOSIS — M542 Cervicalgia: Secondary | ICD-10-CM | POA: Diagnosis not present

## 2022-10-13 DIAGNOSIS — G8929 Other chronic pain: Secondary | ICD-10-CM | POA: Diagnosis not present

## 2022-10-13 DIAGNOSIS — M79641 Pain in right hand: Secondary | ICD-10-CM | POA: Diagnosis not present

## 2022-10-13 DIAGNOSIS — M79602 Pain in left arm: Secondary | ICD-10-CM | POA: Diagnosis not present

## 2022-10-13 DIAGNOSIS — M25512 Pain in left shoulder: Secondary | ICD-10-CM

## 2022-10-13 DIAGNOSIS — M4322 Fusion of spine, cervical region: Secondary | ICD-10-CM | POA: Diagnosis not present

## 2022-10-13 DIAGNOSIS — M79642 Pain in left hand: Secondary | ICD-10-CM | POA: Diagnosis not present

## 2022-10-13 DIAGNOSIS — M7542 Impingement syndrome of left shoulder: Secondary | ICD-10-CM | POA: Diagnosis not present

## 2022-10-13 DIAGNOSIS — M25521 Pain in right elbow: Secondary | ICD-10-CM

## 2022-10-13 NOTE — Procedures (Signed)
EMG & NCV Findings: Evaluation of the left median motor nerve showed prolonged distal onset latency (4.7 ms) and decreased conduction velocity (Elbow-Wrist, 46 m/s).  The left ulnar motor and the right ulnar motor nerves showed decreased conduction velocity (B Elbow-Wrist, L50, R49 m/s) and decreased conduction velocity (A Elbow-B Elbow, L50, R42 m/s).  The left median (across palm) sensory nerve showed no response (Palm), prolonged distal peak latency (5.2 ms), and reduced amplitude (6.5 V).  The right median (across palm) sensory nerve showed prolonged distal peak latency (Wrist, 4.3 ms) and prolonged distal peak latency (Palm, 5.2 ms).  All remaining nerves (as indicated in the following tables) were within normal limits.  Left vs. Right side comparison data for the median motor nerve indicates abnormal L-R latency difference (0.9 ms).  The ulnar motor nerve indicates abnormal L-R amplitude difference (32.7 %).  All remaining left vs. right side differences were within normal limits.    All examined muscles (as indicated in the following table) showed no evidence of electrical instability.    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   a mild right median nerve entrapment at the wrist (carpal tunnel syndrome) affecting sensory and motor components.  The above electrodiagnostic study reveals evidence of peripheral neuropathy of bilateral upper extremities.   There is no significant electrodiagnostic evidence of any other focal nerve entrapment, brachial plexopathy or cervical radiculopathy..   Recommendations: 1.  Follow-up with referring physician. 2.  Continue current management of symptoms. 3.  Continue use of resting splint at night-time and as needed during the day. 4.  Suggest surgical  evaluation.  ___________________________ Gregory West FAAPMR Board Certified, American Board of Physical Medicine and Rehabilitation    Nerve Conduction Studies Anti Sensory Summary Table   Stim Site NR Peak (ms) Norm Peak (ms) P-T Amp (V) Norm P-T Amp Site1 Site2 Delta-P (ms) Dist (cm) Vel (m/s) Norm Vel (m/s)  Left Median Acr Palm Anti Sensory (2nd Digit)  31.9C  Wrist    *5.2 <3.6 *6.5 >10 Wrist Palm  0.0    Palm *NR  <2.0          Right Median Acr Palm Anti Sensory (2nd Digit)  31.5C  Wrist    *4.3 <3.6 12.3 >10 Wrist Palm 0.9 0.0    Palm    *5.2 <2.0 7.2         Left Radial Anti Sensory (Base 1st Digit)  31.6C  Wrist    2.2 <3.1 21.0  Wrist Base 1st Digit 2.2 0.0    Right Radial Anti Sensory (Base 1st Digit)  31.8C  Wrist    2.3 <3.1 20.9  Wrist Base 1st Digit 2.3 0.0    Left Ulnar Anti Sensory (5th Digit)  32.3C  Wrist    3.4 <3.7 19.4 >15.0 Wrist 5th Digit 3.4 14.0 41 >38  Right Ulnar Anti Sensory (5th Digit)  32C  Wrist    3.5 <3.7 20.8 >15.0 Wrist 5th Digit 3.5 14.0 40 >38   Motor Summary Table   Stim Site NR Onset (ms) Norm Onset (ms) O-P Amp (mV) Norm O-P Amp Site1 Site2 Delta-0 (ms) Dist (cm) Vel (m/s) Norm Vel (m/s)  Left Median Motor (Abd Poll Brev)  32C  Wrist    *4.7 <4.2 8.1 >5 Elbow Wrist 4.8 22.0 *46 >50  Elbow    9.5  5.4  Right Median Motor (Abd Poll Brev)  31.9C  Wrist    3.8 <4.2 9.5 >5 Elbow Wrist 4.8 24.0 50 >50  Elbow    8.6  8.3         Left Ulnar Motor (Abd Dig Min)  32.3C  Wrist    2.9 <4.2 6.6 >3 B Elbow Wrist 4.4 22.0 *50 >53  B Elbow    7.3  6.1  A Elbow B Elbow 2.2 11.0 *50 >53  A Elbow    9.5  5.7         Right Ulnar Motor (Abd Dig Min)  31.8C  Wrist    3.2 <4.2 9.8 >3 B Elbow Wrist 4.7 23.0 *49 >53  B Elbow    7.9  9.0  A Elbow B Elbow 2.4 10.0 *42 >53  A Elbow    10.3  8.3          EMG   Side Muscle Nerve Root Ins Act Fibs Psw Amp Dur Poly Recrt Int Dennie Bible Comment  Right 1stDorInt Ulnar C8-T1 Nml Nml Nml Nml Nml 0 Nml  Nml   Right Abd Poll Brev Median C8-T1 Nml Nml Nml Nml Nml 0 Nml Nml   Right ExtDigCom   Nml Nml Nml Nml Nml 0 Nml Nml   Right Triceps Radial C6-7-8 Nml Nml Nml Nml Nml 0 Nml Nml   Right Deltoid Axillary C5-6 Nml Nml Nml Nml Nml 0 Nml Nml   Left 1stDorInt Ulnar C8-T1 Nml Nml Nml Nml Nml 0 Nml Nml   Left Abd Poll Brev Median C8-T1 Nml Nml Nml Nml Nml 0 Nml Nml   Left ExtDigCom   Nml Nml Nml Nml Nml 0 Nml Nml   Left Triceps Radial C6-7-8 Nml Nml Nml Nml Nml 0 Nml Nml   Left Deltoid Axillary C5-6 Nml Nml Nml Nml Nml 0 Nml Nml     Nerve Conduction Studies Anti Sensory Left/Right Comparison   Stim Site L Lat (ms) R Lat (ms) L-R Lat (ms) L Amp (V) R Amp (V) L-R Amp (%) Site1 Site2 L Vel (m/s) R Vel (m/s) L-R Vel (m/s)  Median Acr Palm Anti Sensory (2nd Digit)  31.9C  Wrist *5.2 *4.3 0.9 *6.5 12.3 47.2 Wrist Palm     Palm  *5.2   7.2        Radial Anti Sensory (Base 1st Digit)  31.6C  Wrist 2.2 2.3 0.1 21.0 20.9 0.5 Wrist Base 1st Digit     Ulnar Anti Sensory (5th Digit)  32.3C  Wrist 3.4 3.5 0.1 19.4 20.8 6.7 Wrist 5th Digit 41 40 1   Motor Left/Right Comparison   Stim Site L Lat (ms) R Lat (ms) L-R Lat (ms) L Amp (mV) R Amp (mV) L-R Amp (%) Site1 Site2 L Vel (m/s) R Vel (m/s) L-R Vel (m/s)  Median Motor (Abd Poll Brev)  32C  Wrist *4.7 3.8 *0.9 8.1 9.5 14.7 Elbow Wrist *46 50 4  Elbow 9.5 8.6 0.9 5.4 8.3 34.9       Ulnar Motor (Abd Dig Min)  32.3C  Wrist 2.9 3.2 0.3 6.6 9.8 *32.7 B Elbow Wrist *50 *49 1  B Elbow 7.3 7.9 0.6 6.1 9.0 32.2 A Elbow B Elbow *50 *42 8  A Elbow 9.5 10.3 0.8 5.7 8.3 31.3          Waveforms:

## 2022-10-13 NOTE — Progress Notes (Signed)
Functional Pain Scale - descriptive words and definitions  Moderate (4)   Constantly aware of pain, can complete ADLs with modification/sleep marginally affected at times/passive distraction is of no use, but active distraction gives some relief. Moderate range order  Average Pain  varies  Right handed. Pain in right hand below the middle finger and thumb. Pain and numbness in right elbow. Numbness and tingling from left shoulder to fingers, pain in the middle of the left hand in between middle and ring fingers

## 2022-10-13 NOTE — Progress Notes (Signed)
[name] - 72 y.o. male MRN [id number]  Date of birth: 06/21/1950  Office Visit Note: Visit Date: 10/13/2022 PCP: [name], DO Referred by: Eldred Manges, MD  Subjective: Chief Complaint  Patient presents with   Right Hand - Pain   Left Hand - Numbness, Pain   Right Elbow - Numbness, Pain   Left Arm - Numbness   HPI:  SEBASTION SAGE [name] is a 72 y.o. male who comes in today at the request of Dr. Annell Greening for evaluation and management of chronic, worsening and severe pain, numbness and tingling in the Bilateral upper extremities.  Patient is Right hand dominant.  His main complaint is right-sided hand pain with somewhat global numbness and tingling of referral up the arm to the elbow.  Similar lower symptoms on the left but not as strong.  Has a history of cervical fusion originally at C5-6 and then extended to C3-4 and C4-5.  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  He has mostly left neck and shoulder pain.  He does feel like this refers down to the hand at times on the left.  Neuro cervical MRI is reviewed in detail below.  He is felt weak but no focal weakness.  Hemoglobin A1c is around 6.73 months ago.  He is a diabetic with at least on his problem list noted for diabetic neuropathy.   I spent more than 30 minutes speaking face-to-face with the patient with 50% of the time in counseling and discussing coordination of care.      Review of Systems  Musculoskeletal:  Positive for back pain, joint pain and neck pain.  Neurological:  Positive for tingling.  All other systems reviewed and are negative.  Otherwise per HPI.  Assessment & Plan: Visit Diagnoses:    ICD-10-CM   1. Paresthesia of skin  R20.2 NCV with EMG (electromyography)    2. Pain in right elbow  M25.521     3. Left arm pain  M79.602     4. Bilateral hand pain  M79.641    M79.642     5. Cervicalgia  M54.2     6. 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 Chronic left shoulder pain  M25.512    G89.29     8. Impingement syndrome of left shoulder  M75.42       Plan: Impression: Clinically complicated pain in the neck shoulder and arms and hands.  Symptoms seem to be consistent with a carpal tunnel syndrome on the left more than right.  He does have a history of polyneuropathy from diabetes.  He also has prior cervical surgery.  Electrodiagnostic study performed today.  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   a mild right median nerve entrapment at the wrist (carpal tunnel syndrome) affecting sensory and motor components.  The above electrodiagnostic study reveals evidence of peripheral neuropathy of bilateral upper extremities.   There is no significant electrodiagnostic evidence of any other focal nerve entrapment, brachial plexopathy or cervical radiculopathy..   Recommendations: 1.  Follow-up with referring physician. 2.  Continue current management of symptoms. 3.  Continue use of resting splint at night-time and as needed during the day. 4.  Suggest surgical evaluation.  Meds & Orders: No orders of the defined types were placed in this encounter.  Orders Placed This Encounter  Procedures   NCV with EMG (electromyography)    Follow-up: Return for Annell Greening, MD.   Procedures: No procedures performed  EMG & NCV Findings: Evaluation of the left median motor nerve showed prolonged distal onset latency (4.7 ms) and decreased conduction velocity (Elbow-Wrist, 46 m/s).  The left ulnar motor and the right ulnar motor nerves showed decreased conduction velocity (B Elbow-Wrist, L50, R49 m/s) and decreased conduction velocity (A Elbow-B Elbow, L50, R42 m/s).  The left median (across palm) sensory nerve showed no response (Palm), prolonged distal peak latency (5.2 ms), and reduced  amplitude (6.5 V).  The right median (across palm) sensory nerve showed prolonged distal peak latency (Wrist, 4.3 ms) and prolonged distal peak latency (Palm, 5.2 ms).  All remaining nerves (as indicated in the following tables) were within normal limits.  Left vs. Right side comparison data for the median motor nerve indicates abnormal L-R latency difference (0.9 ms).  The ulnar motor nerve indicates abnormal L-R amplitude difference (32.7 %).  All remaining left vs. right side differences were within normal limits.    All examined muscles (as indicated in the following table) showed no evidence of electrical instability.    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   a mild right median nerve entrapment at the wrist (carpal tunnel syndrome) affecting sensory and motor components.  The above electrodiagnostic study reveals evidence of peripheral neuropathy of bilateral upper extremities.   There is no significant electrodiagnostic evidence of any other focal nerve entrapment, brachial plexopathy or cervical radiculopathy..   Recommendations: 1.  Follow-up with referring physician. 2.  Continue current management of symptoms. 3.  Continue use of resting splint at night-time and as needed during the day. 4.  Suggest surgical evaluation.  ___________________________ Naaman Plummer FAAPMR Board Certified, American Board of Physical Medicine and Rehabilitation    Nerve Conduction Studies Anti Sensory Summary Table   Stim Site NR Peak (ms) Norm Peak (ms) P-T Amp (V) Norm P-T Amp Site1 Site2 Delta-P (ms) Dist (cm) Vel (m/s) Norm Vel (m/s)  Left Median Acr Palm Anti Sensory (2nd Digit)  31.9C  Wrist    *5.2 <3.6 *6.5 >10 Wrist Palm  0.0    Palm *NR  <2.0          Right Median Acr Palm Anti Sensory (2nd Digit)  31.5C  Wrist    *4.3  <3.6 12.3 >10 Wrist Palm 0.9 0.0    Palm    *5.2 <2.0 7.2         Left Radial Anti Sensory (Base 1st Digit)  31.6C  Wrist    2.2 <3.1 21.0  Wrist Base 1st Digit 2.2 0.0    Right Radial Anti Sensory (Base 1st Digit)  31.8C  Wrist    2.3 <3.1 20.9  Wrist Base 1st Digit 2.3 0.0    Left Ulnar Anti Sensory (5th Digit)  32.3C  Wrist    3.4 <3.7 19.4 >15.0 Wrist 5th Digit 3.4 14.0 41 >38  Right Ulnar Anti Sensory (5th Digit)  32C  Wrist    3.5 <3.7 20.8 >15.0 Wrist 5th Digit 3.5 14.0 40 >38   Motor Summary Table   Stim Site NR Onset (ms) Norm Onset (ms) O-P Amp (mV) Norm O-P Amp Site1 Site2 Delta-0 (ms) Dist (cm) Vel (m/s) Norm Vel (m/s)  Left Median Motor (Abd [employer]  Brev)  32C  Wrist    *4.7 <4.2 8.1 >5 Elbow Wrist 4.8 22.0 *46 >50  Elbow    9.5  5.4         Right Median Motor (Abd Poll Brev)  31.9C  Wrist    3.8 <4.2 9.5 >5 Elbow Wrist 4.8 24.0 50 >50  Elbow    8.6  8.3         Left Ulnar Motor (Abd Dig Min)  32.3C  Wrist    2.9 <4.2 6.6 >3 B Elbow Wrist 4.4 22.0 *50 >53  B Elbow    7.3  6.1  A Elbow B Elbow 2.2 11.0 *50 >53  A Elbow    9.5  5.7         Right Ulnar Motor (Abd Dig Min)  31.8C  Wrist    3.2 <4.2 9.8 >3 B Elbow Wrist 4.7 23.0 *49 >53  B Elbow    7.9  9.0  A Elbow B Elbow 2.4 10.0 *42 >53  A Elbow    10.3  8.3          EMG   Side Muscle Nerve Root Ins Act Fibs Psw Amp Dur Poly Recrt Int Dennie Bible Comment  Right 1stDorInt Ulnar C8-T1 Nml Nml Nml Nml Nml 0 Nml Nml   Right Abd Poll Brev Median C8-T1 Nml Nml Nml Nml Nml 0 Nml Nml   Right ExtDigCom   Nml Nml Nml Nml Nml 0 Nml Nml   Right Triceps Radial C6-7-8 Nml Nml Nml Nml Nml 0 Nml Nml   Right Deltoid Axillary C5-6 Nml Nml Nml Nml Nml 0 Nml Nml   Left 1stDorInt Ulnar C8-T1 Nml Nml Nml Nml Nml 0 Nml Nml   Left Abd Poll Brev Median C8-T1 Nml Nml Nml Nml Nml 0 Nml Nml   Left ExtDigCom   Nml Nml Nml Nml Nml 0 Nml Nml   Left Triceps Radial C6-7-8 Nml Nml Nml Nml Nml 0 Nml Nml   Left Deltoid Axillary C5-6 Nml Nml Nml Nml Nml  0 Nml Nml     Nerve Conduction Studies Anti Sensory Left/Right Comparison   Stim Site L Lat (ms) R Lat (ms) L-R Lat (ms) L Amp (V) R Amp (V) L-R Amp (%) Site1 Site2 L Vel (m/s) R Vel (m/s) L-R Vel (m/s)  Median Acr Palm Anti Sensory (2nd Digit)  31.9C  Wrist *5.2 *4.3 0.9 *6.5 12.3 47.2 Wrist Palm     Palm  *5.2   7.2        Radial Anti Sensory (Base 1st Digit)  31.6C  Wrist 2.2 2.3 0.1 21.0 20.9 0.5 Wrist Base 1st Digit     Ulnar Anti Sensory (5th Digit)  32.3C  Wrist 3.4 3.5 0.1 19.4 20.8 6.7 Wrist 5th Digit 41 40 1   Motor Left/Right Comparison   Stim Site L Lat (ms) R Lat (ms) L-R Lat (ms) L Amp (mV) R Amp (mV) L-R Amp (%) Site1 Site2 L Vel (m/s) R Vel (m/s) L-R Vel (m/s)  Median Motor (Abd Poll Brev)  32C  Wrist *4.7 3.8 *0.9 8.1 9.5 14.7 Elbow Wrist *46 50 4  Elbow 9.5 8.6 0.9 5.4 8.3 34.9       Ulnar Motor (Abd Dig Min)  32.3C  Wrist 2.9 3.2 0.3 6.6 9.8 *32.7 B Elbow Wrist *50 *49 1  B Elbow 7.3 7.9 0.6 6.1 9.0 32.2 A Elbow B Elbow *50 *42 8  A Elbow 9.5 10.3 0.8 5.7 8.3 31.3  Waveforms:                      Clinical History: MRI CERVICAL SPINE WITHOUT CONTRAST   TECHNIQUE: Multiplanar, multisequence MR imaging of the cervical spine was performed. No intravenous contrast was administered.   COMPARISON:  Cervical spine radiographs 09/14/2022, cervical spine MRI 06/28/2009 (report only, images not available.   FINDINGS: Alignment: There is a reversal of the normal cervical lordosis. There is no antero or retrolisthesis.   Vertebrae: Vertebral body heights are preserved, without evidence of acute injury. Background marrow signal is normal. There is no suspicious marrow signal abnormality or marrow edema. Postsurgical changes reflecting ACDF from C3 through C5 as well as prior fusion at C5 through C7 are noted.   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 There is additional signal abnormality in the central cord at T2 on the axial images (5-38). This is incompletely imaged on the axial images but appears to extend to the T3 level on the sagittal STIR sequence (4-8).   Posterior Fossa, vertebral arteries, paraspinal tissues: The imaged posterior fossa is unremarkable. The vertebral artery flow voids are normal. The paraspinal soft tissues are unremarkable.   Disc levels:   C2-C3: There is mild facet arthropathy with ligamentum flavum thickening but no significant spinal canal or neural foraminal stenosis   C3-C4: Status post ACDF without residual spinal canal or neural foraminal stenosis   C4-C5: Status post ACDF with suspected mild right neural foraminal stenosis due to endplate spurring and facet arthropathy. No significant spinal canal or left neural foraminal stenosis   C5-C6: Prior fusion without residual spinal canal or neural foraminal stenosis   C6-C7: Prior fusion without residual spinal canal or neural foraminal stenosis.   C7-T1: There is a disc bulge, endplate spurring, and bilateral facet arthropathy with ligamentum flavum thickening resulting in moderate spinal canal stenosis and severe bilateral neural foraminal stenosis   T1-T2: There is a broad-based disc protrusion and bilateral facet arthropathy resulting in mild spinal canal stenosis and mild bilateral neural foraminal stenosis.   IMPRESSION: 1. Status post ACDF at C3 through C5 with additional prior fusion at C5 through C7. No significant residual spinal canal or neural foraminal stenosis at the fused levels. 2. 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 3. Mild spinal canal stenosis and mild bilateral neural foraminal stenosis at T1-T2 due to a disc protrusion and bilateral facet arthropathy. 4. 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 5. Additional central cord signal abnormality at T2 is incompletely evaluated but could reflect a small syrinx. Edema or myelomalacia are considered less likely given lack of cord compression. This could be further evaluated with dedicated thoracic spine MRI if indicated.     Electronically Signed   By: Lesia Hausen M.D.   On: 10/05/2022 08:36     Objective:  VS:  HT:    WT:   BMI:     BP:   HR: bpm  TEMP: ( )  RESP:  Physical Exam Vitals and nursing note reviewed.  Constitutional:      General: He is not in acute distress.    Appearance: Normal appearance. He is well-developed.  HENT:     Head: Normocephalic and atraumatic.  Eyes:     Conjunctiva/sclera: Conjunctivae normal.     Pupils: Pupils  are equal, round, and reactive to light.  Cardiovascular:     Rate and Rhythm: Normal rate.     Pulses: Normal pulses.     Heart sounds: Normal heart sounds.  Pulmonary:     Effort: Pulmonary effort is normal. No respiratory distress.  Musculoskeletal:        General: No tenderness.     Cervical back: Normal range of motion and neck supple. No rigidity.     Right lower leg: No edema.     Left lower leg: No edema.     Comments: Inspection reveals no atrophy of the bilateral APB or FDI or hand intrinsics. There is no swelling, color changes, allodynia or dystrophic changes. There is 5 out of 5 strength in the bilateral wrist extension, finger abduction and long finger flexion. There is intact sensation to light touch in all dermatomal and peripheral nerve distributions. There is a negative Tinel's test at the bilateral wrist and elbow. There is a positive Phalen's test bilaterally. There is a negative Hoffmann's test bilaterally.  Skin:    General: Skin is warm and dry.     Findings: No erythema or rash.  Neurological:     General: No focal deficit present.     Mental Status: He is alert and oriented to person, place, and time.     Cranial Nerves: No cranial  nerve deficit.     Sensory: No sensory deficit.     Motor: No weakness or abnormal muscle tone.     Coordination: Coordination normal.     Gait: Gait abnormal.  Psychiatric:        Mood and Affect: Mood normal.        Behavior: Behavior normal.        Thought Content: Thought content normal.      Imaging: No results found.

## 2022-10-21 ENCOUNTER — Other Ambulatory Visit: Payer: Self-pay | Admitting: Family Medicine

## 2022-10-26 ENCOUNTER — Other Ambulatory Visit: Payer: Self-pay | Admitting: Family Medicine

## 2022-11-02 ENCOUNTER — Ambulatory Visit (INDEPENDENT_AMBULATORY_CARE_PROVIDER_SITE_OTHER): Payer: Medicare Other | Admitting: Orthopaedic Surgery

## 2022-11-02 VITALS — BP 142/83 | HR 86

## 2022-11-02 DIAGNOSIS — G5602 Carpal tunnel syndrome, left upper limb: Secondary | ICD-10-CM | POA: Diagnosis not present

## 2022-11-02 NOTE — Progress Notes (Signed)
Office Visit Note   Patient: Gregory IMGRUND [name]           Date of Birth: February 09, 1951           MRN: [id number] Visit Date: 11/02/2022              Requested by: [name], DO [address] [place],  [place] [address] PCP: [name], DO   Assessment & Plan: Visit Diagnoses:  1. Carpal tunnel syndrome, left upper limb     Plan: Proceed with left carpal tunnel release.  Procedure discussed he understands request to proceed.  Follow-Up Instructions: No follow-ups on file.   Orders:  No orders of the defined types were placed in this encounter.  No orders of the defined types were placed in this encounter.     Procedures: No procedures performed   Clinical Data: No additional findings.   Subjective: Chief Complaint  Patient presents with   Other     Review EMG/NCV    HPI 72 year old male previous cervical fusions returns post electrical test for persistent problems with hand numbness wakes him up at night failed to respond to splinting left hand worse than right hand.  Nocturnal test 10/13/2022 shows moderate left median nerve entrapment at the wrist and mild on the right hand.  He states he is ready for the left hand surgery.  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  He has some mild protrusion C7-T1 with moderate canal stenosis and also some mild stenosis T1-T2 with disc protrusion.  No myelopathic symptoms.  He does occasionally have electrical type sensation in his left shoulder intermittently and he had small area myelopathic changes at C3-4 level which was also noted on his 2011 MRI scan.  Principal problem is been left hand numbness that wakes him up at night.  Right hand does not bother him too much.  Review of Systems history of pancreatitis ,BPH, cervical fusions.  Sleep apnea type 2 diabetes.  Last A1c 6.7 good control.  All the systems  noncontributory to HPI.   Objective: Vital Signs: BP (!) 142/83   Pulse 86   Physical Exam Constitutional:      Appearance: He is well-developed.  HENT:     Head: Normocephalic and atraumatic.     Right Ear: External ear normal.     Left Ear: External ear normal.  Eyes:     Pupils: Pupils are equal, round, and reactive to light.  Neck:     Thyroid: No thyromegaly.     Trachea: No tracheal deviation.  Cardiovascular:     Rate and Rhythm: Normal rate.  Pulmonary:     Effort: Pulmonary effort is normal.     Breath sounds: No wheezing.  Abdominal:     General: Bowel sounds are normal.     Palpations: Abdomen is soft.  Musculoskeletal:     Cervical back: Neck supple.  Skin:    General: Skin is warm and dry.     Capillary Refill: Capillary refill takes less than 2 seconds.  Neurological:     Mental Status: He is alert and oriented to person, place, and time.  Psychiatric:        Behavior: Behavior normal.        Thought Content: Thought content normal.        Judgment: Judgment normal.     Ortho Exam positive Phalen's left positive  carpal compression test left.  No thenar atrophy but slight thenar weakness left normal thenar strength right.  Healed cervical fusions 50% cervical rotation and flexion extension with mild discomfort.  Specialty Comments:  MRI CERVICAL SPINE WITHOUT CONTRAST   TECHNIQUE: Multiplanar, multisequence MR imaging of the cervical spine was performed. No intravenous contrast was administered.   COMPARISON:  Cervical spine radiographs 09/14/2022, cervical spine MRI 06/28/2009 (report only, images not available.   FINDINGS: Alignment: There is a reversal of the normal cervical lordosis. There is no antero or retrolisthesis.   Vertebrae: Vertebral body heights are preserved, without evidence of acute injury. Background marrow signal is normal. There is no suspicious marrow signal abnormality or marrow edema. Postsurgical changes reflecting ACDF  from C3 through C5 as well as prior fusion at C5 through C7 are noted.   Cord: There are small foci of signal abnormality in the right and left aspect of the cord at C3-C4 likely reflecting myelomalacia. There is additional signal abnormality in the central cord at T2 on the axial images (5-38). This is incompletely imaged on the axial images but appears to extend to the T3 level on the sagittal STIR sequence (4-8).   Posterior Fossa, vertebral arteries, paraspinal tissues: The imaged posterior fossa is unremarkable. The vertebral artery flow voids are normal. The paraspinal soft tissues are unremarkable.   Disc levels:   C2-C3: There is mild facet arthropathy with ligamentum flavum thickening but no significant spinal canal or neural foraminal stenosis   C3-C4: Status post ACDF without residual spinal canal or neural foraminal stenosis   C4-C5: Status post ACDF with suspected mild right neural foraminal stenosis due to endplate spurring and facet arthropathy. No significant spinal canal or left neural foraminal stenosis   C5-C6: Prior fusion without residual spinal canal or neural foraminal stenosis   C6-C7: Prior fusion without residual spinal canal or neural foraminal stenosis.   C7-T1: There is a disc bulge, endplate spurring, and bilateral facet arthropathy with ligamentum flavum thickening resulting in moderate spinal canal stenosis and severe bilateral neural foraminal stenosis   T1-T2: There is a broad-based disc protrusion and bilateral facet arthropathy resulting in mild spinal canal stenosis and mild bilateral neural foraminal stenosis.   IMPRESSION: 1. Status post ACDF at C3 through C5 with additional prior fusion at C5 through C7. No significant residual spinal canal or neural foraminal stenosis at the fused levels. 2. 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 3. Mild spinal  canal stenosis and mild bilateral neural foraminal stenosis at T1-T2 due to a disc protrusion and bilateral facet arthropathy. 4. Small foci of cord signal abnormality at C3-C4 consistent with myelomalacia. 5. Additional central cord signal abnormality at T2 is incompletely evaluated but could reflect a small syrinx. Edema or myelomalacia are considered less likely given lack of cord compression. This could be further evaluated with dedicated thoracic spine MRI if indicated.     Electronically Signed   By: Lesia Hausen M.D.   On: 10/05/2022 08:36  Imaging: No results found.   PMFS History: Patient Active Problem List   Diagnosis Date Noted   Bilateral hand numbness 10/06/2022   Atypical pigmented lesion 09/29/2022   ED (erectile dysfunction) 09/29/2022   Fusion of spine of cervical region 09/14/2022   Bruising 09/08/2022   Acute tear of posterior aspect of lateral meniscus of left knee 01/29/2022   Bilateral third flexor stenosing tenosynovitis and first carpometacarpal osteoarthritis. 10/30/2021  Exercise counseling 07/29/2021   Other specified counseling 07/29/2021   Primary open-angle glaucoma, bilateral, mild stage 07/29/2021   Diabetic neuropathy (HCC) 07/31/2020   Abdominal aortic aneurysm (HCC) 07/31/2020   Insomnia 07/31/2020   GERD (gastroesophageal reflux disease) 01/09/2020   Impingement syndrome of both shoulders 08/02/2019   Interstitial pulmonary disease (HCC) 06/29/2018   Hypertension associated with diabetes (HCC) 07/13/2017   Benign essential tremor 07/13/2017   Kidney cyst, acquired 01/24/2017   Seborrheic keratoses 04/01/2016   Diverticulosis of colon without hemorrhage 02/26/2016   Anxiety state 10/02/2015   BPH (benign prostatic hyperplasia) 07/31/2015   Carpal tunnel syndrome, left upper limb 06/04/2015   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 Normal cors 2001. b. Neg stress test 2012, 05/2014.   Diabetes mellitus without complication (HCC)    GERD (gastroesophageal reflux disease)    Hyperlipidemia    Hypertension    Hypertensive response to exercise    Hypothyroidism    Obesity    Obstructive sleep apnea    Currently untreated    PONV (postoperative nausea and vomiting)    Sleep apnea     Family History  Problem Relation Age of Onset   Diabetes Mother    Heart disease Mother    Hypertension Mother    Stroke Mother    Heart attack Mother    COPD Mother    Cancer Father    Heart disease Father    Diabetes Father    Hyperlipidemia Sister    Hypertension Sister    Diabetes Maternal Grandmother    Stroke Maternal Grandmother    Heart attack Maternal Grandmother    Heart disease Maternal Grandmother    Diabetes Maternal Grandfather    Hypertension Maternal Grandfather    Heart attack Maternal Grandfather    Heart disease Maternal Grandfather    Diabetes Sister    Hypertension Sister    Cancer Sister    Hyperlipidemia Sister    Heart attack Paternal Grandmother    Heart attack Paternal Grandfather    Cancer Sister    Hyperlipidemia Sister    COPD Maternal Aunt    Hypertension Maternal Aunt    Diabetes Son    Diabetes Maternal Aunt     Past Surgical History:  Procedure Laterality Date   CARDIAC CATHETERIZATION     CERVICAL FUSION     HERNIA REPAIR     KNEE ARTHROSCOPY  1984   both knee   [facility]   SHOULDER ARTHROSCOPY WITH SUBACROMIAL DECOMPRESSION Right 09/13/2019   Procedure: RIGHT SHOULDER ARTHROSCOPY WITH EXTENSIVE DEBRIDEMENT, SUBACROMIAL DECOMPRESSION,;  Surgeon: Kathryne Hitch, MD;   Location: [facility];  Service: Orthopedics;  Laterality: Right;   SPINE SURGERY  1st Sept 1977   2nd Feb 2011   Social History   Occupational History    Comment: Retired  Tobacco Use   Smoking status: Former    Packs/day: 1.50    Years: 30.00    Additional pack years: 0.00    Total pack years: 45.00    Types: Cigarettes    Quit date: 05/31/1993  Years since quitting: 29.4   Smokeless tobacco: Never   Tobacco comments:    24 years smoke free and alcohol free  Vaping Use   Vaping Use: Never used  Substance and Sexual Activity   Alcohol use: No   Drug use: No   Sexual activity: Yes    Birth control/protection: None    Comment: Not Necessary

## 2022-11-22 ENCOUNTER — Other Ambulatory Visit: Payer: Self-pay | Admitting: Orthopaedic Surgery

## 2022-11-22 DIAGNOSIS — G5602 Carpal tunnel syndrome, left upper limb: Secondary | ICD-10-CM | POA: Diagnosis not present

## 2022-11-22 MED ORDER — HYDROCODONE-ACETAMINOPHEN 5-325 MG PO TABS: 1.0000 | ORAL_TABLET | Freq: Four times a day (QID) | ORAL | 0 refills | Status: DC | PRN

## 2022-11-30 ENCOUNTER — Ambulatory Visit (INDEPENDENT_AMBULATORY_CARE_PROVIDER_SITE_OTHER): Payer: Medicare Other | Admitting: Orthopaedic Surgery

## 2022-11-30 ENCOUNTER — Encounter: Payer: Self-pay | Admitting: Orthopaedic Surgery

## 2022-11-30 VITALS — BP 130/76 | HR 76 | Ht 69.5 in | Wt 268.0 lb

## 2022-11-30 DIAGNOSIS — R200 Anesthesia of skin: Secondary | ICD-10-CM

## 2022-11-30 NOTE — Progress Notes (Signed)
Post-Op Visit Note   Patient: Gregory WEATHERALL [name]           Date of Birth: 01-Nov-1950           MRN: [id number] Visit Date: 11/30/2022 PCP: [name], DO   Assessment & Plan: Follow-up by left carpal tunnel release incision looks good.  Respond applied return 1 week for suture removal left hand.  Opposite right hand gives him minimal symptoms.  He has had some triggering of his long finger has had an injected twice and is a little bit sore but not catching at the present time.   Chief Complaint:  Chief Complaint  Patient presents with   Left Hand - Routine Post Op    11/22/2022 Left CTR   Visit Diagnoses:  1. Bilateral hand numbness     Plan: Return in 1 week for left carpal tunnel suture removal.  Follow-Up Instructions: No follow-ups on file.   Orders:  No orders of the defined types were placed in this encounter.  No orders of the defined types were placed in this encounter.   Imaging: No results found.  PMFS History: Patient Active Problem List   Diagnosis Date Noted   Bilateral hand numbness 10/06/2022   Atypical pigmented lesion 09/29/2022   ED (erectile dysfunction) 09/29/2022   Fusion of spine of cervical region 09/14/2022   Bruising 09/08/2022   Acute tear of posterior aspect of lateral meniscus of left knee 01/29/2022   Bilateral third flexor stenosing tenosynovitis and first carpometacarpal osteoarthritis. 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   Hypertension associated with diabetes (HCC) 07/13/2017   Benign essential tremor 07/13/2017   Kidney cyst, acquired 01/24/2017    Seborrheic keratoses 04/01/2016   Diverticulosis of colon without hemorrhage 02/26/2016   Anxiety state 10/02/2015   BPH (benign prostatic hyperplasia) 07/31/2015   Carpal tunnel syndrome, left upper limb 06/04/2015   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 Normal cors 2001. b. Neg stress test 2012, 05/2014.   Diabetes mellitus without complication (HCC)    GERD (gastroesophageal reflux disease)    Hyperlipidemia    Hypertension    Hypertensive response to exercise    Hypothyroidism    Obesity    Obstructive sleep apnea    Currently untreated    PONV (postoperative nausea and vomiting)    Sleep apnea     Family History  Problem Relation Age of Onset   Diabetes Mother    Heart disease Mother    Hypertension Mother    Stroke Mother    Heart attack Mother    COPD Mother    Cancer Father    Heart disease Father    Diabetes Father    Hyperlipidemia Sister    Hypertension Sister    Diabetes Maternal Grandmother    Stroke  Maternal Grandmother    Heart attack Maternal Grandmother    Heart disease Maternal Grandmother    Diabetes Maternal Grandfather    Hypertension Maternal Grandfather    Heart attack Maternal Grandfather    Heart disease Maternal Grandfather    Diabetes Sister    Hypertension Sister    Cancer Sister    Hyperlipidemia Sister    Heart attack Paternal Grandmother    Heart attack Paternal Grandfather    Cancer Sister    Hyperlipidemia Sister    COPD Maternal Aunt    Hypertension Maternal Aunt    Diabetes Son    Diabetes Maternal Aunt     Past Surgical History:  Procedure  Laterality Date   CARDIAC CATHETERIZATION     CERVICAL FUSION     HERNIA REPAIR     KNEE ARTHROSCOPY  1984   both knee   [facility]   SHOULDER ARTHROSCOPY WITH SUBACROMIAL DECOMPRESSION Right 09/13/2019   Procedure: RIGHT SHOULDER ARTHROSCOPY WITH EXTENSIVE DEBRIDEMENT, SUBACROMIAL DECOMPRESSION,;  Surgeon: Kathryne Hitch, MD;  Location: [facility];  Service: Orthopedics;  Laterality: Right;   SPINE SURGERY  1st Sept 1977   2nd Feb 2011   Social History   Occupational History    Comment: Retired  Tobacco Use   Smoking status: Former    Packs/day: 1.50    Years: 30.00    Additional pack years: 0.00    Total pack years: 45.00    Types: Cigarettes    Quit date: 05/31/1993    Years since quitting: 29.5   Smokeless tobacco: Never   Tobacco comments:    24 years smoke free and alcohol free  Vaping Use   Vaping Use: Never used  Substance and Sexual Activity   Alcohol use: No   Drug use: No   Sexual activity: Yes    Birth control/protection: None    Comment: Not Necessary

## 2022-12-06 ENCOUNTER — Ambulatory Visit: Payer: Medicare Other

## 2022-12-07 ENCOUNTER — Ambulatory Visit: Payer: Medicare Other | Attending: Cardiology

## 2022-12-07 ENCOUNTER — Ambulatory Visit: Payer: Medicare Other

## 2022-12-07 DIAGNOSIS — E785 Hyperlipidemia, unspecified: Secondary | ICD-10-CM

## 2022-12-07 DIAGNOSIS — Z79899 Other long term (current) drug therapy: Secondary | ICD-10-CM | POA: Diagnosis not present

## 2022-12-07 DIAGNOSIS — E782 Mixed hyperlipidemia: Secondary | ICD-10-CM

## 2022-12-08 ENCOUNTER — Telehealth: Payer: Self-pay | Admitting: *Deleted

## 2022-12-08 LAB — LIPID PANEL
Chol/HDL Ratio: 4.3 ratio (ref 0.0–5.0)
Cholesterol, Total: 165 mg/dL (ref 100–199)
HDL: 38 mg/dL — ABNORMAL LOW (ref >39)
LDL Chol Calc (NIH): 110 mg/dL — ABNORMAL HIGH (ref 0–99)
Triglycerides: 88 mg/dL (ref 0–149)
VLDL Cholesterol Cal: 17 mg/dL (ref 5–40)

## 2022-12-08 NOTE — Telephone Encounter (Signed)
-----   Message from Meriam Sprague, MD sent at 12/08/2022  7:00 AM EDT ----- His cholesterol is better but still above goal (goal <70). Has he had issues with higher dose crestor? It looks like he has not tolerated many other statins. If he is willing, we can refer him to lipid clinic for non-statin alternatives.

## 2022-12-08 NOTE — Telephone Encounter (Signed)
Left message to call office

## 2022-12-10 ENCOUNTER — Telehealth: Payer: Self-pay

## 2022-12-10 DIAGNOSIS — E785 Hyperlipidemia, unspecified: Secondary | ICD-10-CM

## 2022-12-10 NOTE — Telephone Encounter (Signed)
The patient has been notified of the result and verbalized understanding.  All questions (if any) were answered. Frutoso Schatz, RN 12/10/2022 9:09 AM  Patient cannot tolerate higher doses of Crsetor. He is agreeable to seeing lipid clinic. Referral has been placed. Patient also states that he would like to switch care to Dr. Eldridge [name] once Dr. Shari Prows leaves.

## 2022-12-10 NOTE — Telephone Encounter (Signed)
-----   Message from Meriam Sprague sent at 12/08/2022  7:00 AM EDT ----- His cholesterol is better but still above goal (goal <70). Has he had issues with higher dose crestor? It looks like he has not tolerated many other statins. If he is willing, we can refer him to lipid clinic for non-statin alternatives.

## 2022-12-14 NOTE — Telephone Encounter (Signed)
Frutoso Schatz, RN 12/10/2022  9:08 AM EDT Back to Top    The patient has been notified of the result and verbalized understanding.  All questions (if any) were answered. Frutoso Schatz, RN 12/10/2022 9:08 AM

## 2022-12-16 ENCOUNTER — Ambulatory Visit: Payer: Medicare Other | Admitting: Family Medicine

## 2022-12-16 ENCOUNTER — Encounter: Payer: Self-pay | Admitting: Family Medicine

## 2022-12-16 VITALS — BP 122/77 | HR 80 | Ht 69.5 in | Wt 259.0 lb

## 2022-12-16 DIAGNOSIS — L97921 Non-pressure chronic ulcer of unspecified part of left lower leg limited to breakdown of skin: Secondary | ICD-10-CM

## 2022-12-16 DIAGNOSIS — L97929 Non-pressure chronic ulcer of unspecified part of left lower leg with unspecified severity: Secondary | ICD-10-CM | POA: Insufficient documentation

## 2022-12-16 HISTORY — DX: Non-pressure chronic ulcer of unspecified part of left lower leg with unspecified severity: L97.929

## 2022-12-16 MED ORDER — DOXYCYCLINE HYCLATE 100 MG PO TABS: 100.0000 mg | ORAL_TABLET | Freq: Two times a day (BID) | ORAL | 0 refills | Status: DC

## 2022-12-16 NOTE — Patient Instructions (Signed)
Change dressing every 2 days.  Start doxycycline  See me again in 1 week.  Call if having worsening.

## 2022-12-16 NOTE — Progress Notes (Signed)
Gregory Ready [name] - 72 y.o. male MRN [id number]  Date of birth: 04/06/1951  Subjective Chief Complaint  Patient presents with   Wound Check    HPI Gregory MOYERS [name] is a 72 y.o. male here today with complaint of wound on the L leg just above the L ankle.  This has been present for a few days.  He hit area causing a skin tear recently.    Area is draining a purulent material.  He does have chronic lymphedema and venous stasis, uses lymphedema compression device at home.  He does have some pain associated with the wound.  Denies fever or chills.  He has not tried anything so far.   ROS:  A comprehensive ROS was completed and negative except as noted per HPI    Allergies  Allergen Reactions   Statins     Muscle cramping all over body. Lipitor, Crestor, and pravastatin.   Atorvastatin     Other reaction(s): "severe cramps in muscles and tendons"   Rosuvastatin     Other reaction(s): "severe cramps in muscles and tendons"   Liraglutide Other (See Comments)    Caused pancreatitis Other reaction(s): pancreas infection   Lisinopril Cough    Past Medical History:  Diagnosis Date   Allergy not sure   Arthritis    Chest pain    a. Normal cors 2001. b. Neg stress test 2012, 05/2014.   Diabetes mellitus without complication (HCC)    GERD (gastroesophageal reflux disease)    Hyperlipidemia    Hypertension    Hypertensive response to exercise    Hypothyroidism    Obesity    Obstructive sleep apnea    Currently untreated    PONV (postoperative nausea and vomiting)    Sleep apnea    Ulcer of left lower extremity (HCC) 12/16/2022    Past Surgical History:  Procedure Laterality Date   CARDIAC CATHETERIZATION     CERVICAL FUSION     HERNIA REPAIR     KNEE ARTHROSCOPY  1984   both knee   [facility]   SHOULDER ARTHROSCOPY WITH SUBACROMIAL DECOMPRESSION Right 09/13/2019   Procedure: RIGHT SHOULDER ARTHROSCOPY WITH EXTENSIVE DEBRIDEMENT,  SUBACROMIAL DECOMPRESSION,;  Surgeon: Kathryne Hitch, MD;  Location: [facility];  Service: Orthopedics;  Laterality: Right;   SPINE SURGERY  1st Sept 1977   2nd Feb 2011    Social History   Socioeconomic History   Marital status: Married    Spouse name: [name]   Number of children: 2   Years of education: 16   Highest education level: Bachelor's degree (e.g., BA, AB, BS)  Occupational History    Comment: Retired  Tobacco Use   Smoking status: Former    Current packs/day: 0.00    Average packs/day: 1.5 packs/day for 30.0 years (45.0 ttl pk-yrs)    Types: Cigarettes    Start date: 06/01/1963    Quit date: 05/31/1993    Years since quitting: 29.5   Smokeless tobacco: Never   Tobacco comments:    24 years smoke free and alcohol free  Vaping Use   Vaping status: Never Used  Substance and Sexual Activity   Alcohol use: No   Drug use: No   Sexual activity: Yes    Birth control/protection: None    Comment: Not Necessary  Other Topics Concern   Not on file  Social History Narrative   Lives with his wife. He has two children and three  grand children. He likes to read his bible and go fishing in his free time.    Social Determinants of Health   Financial Resource Strain: Low Risk  (09/16/2022)   Overall Financial Resource Strain (CARDIA)    Difficulty of Paying Living Expenses: Not very hard  Recent Concern: Financial Resource Strain - Medium Risk (08/27/2022)   Received from [facility]   Overall Financial Resource Strain (CARDIA)    Difficulty of Paying Living Expenses: Somewhat hard  Food Insecurity: Food Insecurity Present (09/16/2022)   Hunger Vital Sign    Worried About Running Out of Food in the Last Year: Sometimes true    Ran Out of Food in the Last Year: Never true  Transportation Needs: No Transportation Needs (09/16/2022)   PRAPARE - Administrator, Civil Service (Medical): No    Lack of Transportation  (Non-Medical): No  Physical Activity: Sufficiently Active (09/16/2022)   Exercise Vital Sign    Days of Exercise per Week: 3 days    Minutes of Exercise per Session: 90 min  Recent Concern: Physical Activity - Insufficiently Active (09/06/2022)   Exercise Vital Sign    Days of Exercise per Week: 3 days    Minutes of Exercise per Session: 30 min  Stress: No Stress Concern Present (09/16/2022)   [employer] of Occupational Health - Occupational Stress Questionnaire    Feeling of Stress : Only a little  Recent Concern: Stress - Stress Concern Present (09/06/2022)   [employer] of Occupational Health - Occupational Stress Questionnaire    Feeling of Stress : To some extent  Social Connections: Socially Integrated (09/20/2022)   Social Connection and Isolation Panel [NHANES]    Frequency of Communication with Friends and Family: Three times a week    Frequency of Social Gatherings with Friends and Family: Three times a week    Attends Religious Services: More than 4 times per year    Active Member of Clubs or Organizations: Yes    Attends Engineer, structural: More than 4 times per year    Marital Status: Married    Family History  Problem Relation Age of Onset   Diabetes Mother    Heart disease Mother    Hypertension Mother    Stroke Mother    Heart attack Mother    COPD Mother    Cancer Father    Heart disease Father    Diabetes Father    Hyperlipidemia Sister    Hypertension Sister    Diabetes Maternal Grandmother    Stroke Maternal Grandmother    Heart attack Maternal Grandmother    Heart disease Maternal Grandmother    Diabetes Maternal Grandfather    Hypertension Maternal Grandfather    Heart attack Maternal Grandfather    Heart disease Maternal Grandfather    Diabetes Sister    Hypertension Sister    Cancer Sister    Hyperlipidemia Sister    Heart attack Paternal Grandmother    Heart attack Paternal Grandfather    Cancer Sister    Hyperlipidemia  Sister    COPD Maternal Aunt    Hypertension Maternal Aunt    Diabetes Son    Diabetes Maternal Aunt     Health Maintenance  Topic Date Due   Diabetic kidney evaluation - Urine ACR  11/12/2022   COVID-19 Vaccine (5 - 2023-24 season) 03/26/2023 (Originally 01/29/2022)   HEMOGLOBIN A1C  12/21/2022   INFLUENZA VACCINE  12/30/2022   Diabetic kidney evaluation - eGFR measurement  01/30/2023   OPHTHALMOLOGY EXAM  06/12/2023   FOOT EXAM  09/08/2023   Medicare Annual Wellness (AWV)  09/20/2023   Colonoscopy  02/23/2026   DTaP/Tdap/Td (6 - Td or Tdap) 10/27/2032   Pneumonia Vaccine 105+ Years old  Completed   Hepatitis C Screening  Completed   Zoster Vaccines- Shingrix  Completed   HPV VACCINES  Aged Out     ----------------------------------------------------------------------------------------------------------------------------------------------------------------------------------------------------------------- Physical Exam BP 122/77 (BP Location: Left Arm, Patient Position: Sitting, Cuff Size: Large)   Pulse 80   Ht 5' 9.5" (1.765 m)   Wt 259 lb (117.5 kg)   SpO2 98%   BMI 37.70 kg/m   Physical Exam Constitutional:      Appearance: Normal appearance.  HENT:     Head: Normocephalic and atraumatic.  Eyes:     General: No scleral icterus. Skin:    Comments: Small ulceration to the surface of the skin located on the left lateral lower leg.  Area is tender with some purulent drainage.  Neurological:     General: No focal deficit present.     Mental Status: He is alert.  Psychiatric:        Mood and Affect: Mood normal.        Behavior: Behavior normal.     ------------------------------------------------------------------------------------------------------------------------------------------------------------------------------------------------------------------- Assessment and Plan  Ulcer of left lower extremity (HCC) He does have risk factors of diabetes, venous  insufficiency and peripheral neuropathy.  Wound dressed with Vaseline gauze, cover with nonadherent pad and wrapped any.  Adding course of doxycycline.  Return to recheck wound in approximately 1 week.   Meds ordered this encounter  Medications   doxycycline (VIBRA-TABS) 100 MG tablet    Sig: Take 1 tablet (100 mg total) by mouth 2 (two) times daily.    Dispense:  20 tablet    Refill:  0    Return in about 1 week (around 12/23/2022) for F/u Wound.    This visit occurred during the SARS-CoV-2 public health emergency.  Safety protocols were in place, including screening questions prior to the visit, additional usage of staff PPE, and extensive cleaning of exam room while observing appropriate contact time as indicated for disinfecting solutions.

## 2022-12-16 NOTE — Assessment & Plan Note (Signed)
He does have risk factors of diabetes, venous insufficiency and peripheral neuropathy.  Wound dressed with Vaseline gauze, cover with nonadherent pad and wrapped any.  Adding course of doxycycline.  Return to recheck wound in approximately 1 week.

## 2022-12-21 ENCOUNTER — Encounter: Payer: Self-pay | Admitting: Family Medicine

## 2022-12-21 ENCOUNTER — Ambulatory Visit (INDEPENDENT_AMBULATORY_CARE_PROVIDER_SITE_OTHER): Payer: Medicare Other | Admitting: Family Medicine

## 2022-12-21 VITALS — BP 131/85 | HR 82 | Ht 69.5 in | Wt 260.0 lb

## 2022-12-21 DIAGNOSIS — E1149 Type 2 diabetes mellitus with other diabetic neurological complication: Secondary | ICD-10-CM | POA: Diagnosis not present

## 2022-12-21 DIAGNOSIS — L97921 Non-pressure chronic ulcer of unspecified part of left lower leg limited to breakdown of skin: Secondary | ICD-10-CM

## 2022-12-21 LAB — POCT UA - MICROALBUMIN
Albumin/Creatinine Ratio, Urine, POC: <30
Creatinine, POC: 300 mg/dL
Microalbumin Ur, POC: 80 mg/L

## 2022-12-21 NOTE — Progress Notes (Signed)
Gregory Ready [name] - 72 y.o. male MRN [id number]  Date of birth: 01/21/1951  Subjective Chief Complaint  Patient presents with   Wound Check    HPI Gregory Ready [name] Is a 72 y.o. male here today for follow up of wound on leg.  Started on doxycycline at last visit and wound dressed.  He has been changing dressing regularly.  He feels that wound is healing well.  Denies increase pain or drainage.  No fever or chills.  He has continued use of his lymphedema compression device as well.  ROS:  A comprehensive ROS was completed and negative except as noted per HPI  Allergies  Allergen Reactions   Statins     Muscle cramping all over body. Lipitor, Crestor, and pravastatin.   Atorvastatin     Other reaction(s): "severe cramps in muscles and tendons"   Rosuvastatin     Other reaction(s): "severe cramps in muscles and tendons"   Liraglutide Other (See Comments)    Caused pancreatitis Other reaction(s): pancreas infection   Lisinopril Cough    Past Medical History:  Diagnosis Date   Allergy not sure   Arthritis    Chest pain    a. Normal cors 2001. b. Neg stress test 2012, 05/2014.   Diabetes mellitus without complication (HCC)    GERD (gastroesophageal reflux disease)    Hyperlipidemia    Hypertension    Hypertensive response to exercise    Hypothyroidism    Obesity    Obstructive sleep apnea    Currently untreated    PONV (postoperative nausea and vomiting)    Sleep apnea    Ulcer of left lower extremity (HCC) 12/16/2022    Past Surgical History:  Procedure Laterality Date   CARDIAC CATHETERIZATION     CERVICAL FUSION     HERNIA REPAIR     KNEE ARTHROSCOPY  1984   both knee   [facility]   SHOULDER ARTHROSCOPY WITH SUBACROMIAL DECOMPRESSION Right 09/13/2019   Procedure: RIGHT SHOULDER ARTHROSCOPY WITH EXTENSIVE DEBRIDEMENT, SUBACROMIAL DECOMPRESSION,;  Surgeon: Kathryne Hitch, MD;  Location: [facility];   Service: Orthopedics;  Laterality: Right;   SPINE SURGERY  1st Sept 1977   2nd Feb 2011    Social History   Socioeconomic History   Marital status: Married    Spouse name: [name]   Number of children: 2   Years of education: 16   Highest education level: Bachelor's degree (e.g., BA, AB, BS)  Occupational History    Comment: Retired  Tobacco Use   Smoking status: Former    Current packs/day: 0.00    Average packs/day: 1.5 packs/day for 30.0 years (45.0 ttl pk-yrs)    Types: Cigarettes    Start date: 06/01/1963    Quit date: 05/31/1993    Years since quitting: 29.5   Smokeless tobacco: Never   Tobacco comments:    24 years smoke free and alcohol free  Vaping Use   Vaping status: Never Used  Substance and Sexual Activity   Alcohol use: No   Drug use: No   Sexual activity: Yes    Birth control/protection: None    Comment: Not Necessary  Other Topics Concern   Not on file  Social History Narrative   Lives with his wife. He has two children and three grand children. He likes to read his bible and go fishing in his free time.    Social Determinants of Corporate investment banker  Strain: Low Risk  (09/16/2022)   Overall Financial Resource Strain (CARDIA)    Difficulty of Paying Living Expenses: Not very hard  Recent Concern: Financial Resource Strain - Medium Risk (08/27/2022)   Received from [facility]   Overall Financial Resource Strain (CARDIA)    Difficulty of Paying Living Expenses: Somewhat hard  Food Insecurity: Food Insecurity Present (09/16/2022)   Hunger Vital Sign    Worried About Running Out of Food in the Last Year: Sometimes true    Ran Out of Food in the Last Year: Never true  Transportation Needs: No Transportation Needs (09/16/2022)   PRAPARE - Administrator, Civil Service (Medical): No    Lack of Transportation (Non-Medical): No  Physical Activity: Sufficiently Active (09/16/2022)   Exercise Vital Sign    Days of Exercise per  Week: 3 days    Minutes of Exercise per Session: 90 min  Recent Concern: Physical Activity - Insufficiently Active (09/06/2022)   Exercise Vital Sign    Days of Exercise per Week: 3 days    Minutes of Exercise per Session: 30 min  Stress: No Stress Concern Present (09/16/2022)   [employer] of Occupational Health - Occupational Stress Questionnaire    Feeling of Stress : Only a little  Recent Concern: Stress - Stress Concern Present (09/06/2022)   [employer] of Occupational Health - Occupational Stress Questionnaire    Feeling of Stress : To some extent  Social Connections: Socially Integrated (09/20/2022)   Social Connection and Isolation Panel [NHANES]    Frequency of Communication with Friends and Family: Three times a week    Frequency of Social Gatherings with Friends and Family: Three times a week    Attends Religious Services: More than 4 times per year    Active Member of Clubs or Organizations: Yes    Attends Engineer, structural: More than 4 times per year    Marital Status: Married    Family History  Problem Relation Age of Onset   Diabetes Mother    Heart disease Mother    Hypertension Mother    Stroke Mother    Heart attack Mother    COPD Mother    Cancer Father    Heart disease Father    Diabetes Father    Hyperlipidemia Sister    Hypertension Sister    Diabetes Maternal Grandmother    Stroke Maternal Grandmother    Heart attack Maternal Grandmother    Heart disease Maternal Grandmother    Diabetes Maternal Grandfather    Hypertension Maternal Grandfather    Heart attack Maternal Grandfather    Heart disease Maternal Grandfather    Diabetes Sister    Hypertension Sister    Cancer Sister    Hyperlipidemia Sister    Heart attack Paternal Grandmother    Heart attack Paternal Grandfather    Cancer Sister    Hyperlipidemia Sister    COPD Maternal Aunt    Hypertension Maternal Aunt    Diabetes Son    Diabetes Maternal Aunt     Health  Maintenance  Topic Date Due   Diabetic kidney evaluation - Urine ACR  11/12/2022   HEMOGLOBIN A1C  12/21/2022   COVID-19 Vaccine (5 - 2023-24 season) 03/26/2023 (Originally 01/29/2022)   INFLUENZA VACCINE  12/30/2022   Diabetic kidney evaluation - eGFR measurement  01/30/2023   OPHTHALMOLOGY EXAM  06/12/2023   FOOT EXAM  09/08/2023   Medicare Annual Wellness (AWV)  09/20/2023   Colonoscopy  02/23/2026   DTaP/Tdap/Td (6 - Td or Tdap) 10/27/2032   Pneumonia Vaccine 41+ Years old  Completed   Hepatitis C Screening  Completed   Zoster Vaccines- Shingrix  Completed   HPV VACCINES  Aged Out     ----------------------------------------------------------------------------------------------------------------------------------------------------------------------------------------------------------------- Physical Exam BP 131/85 (BP Location: Left Arm, Patient Position: Sitting, Cuff Size: Large)   Pulse 82   Ht 5' 9.5" (1.765 m)   Wt 260 lb (117.9 kg)   SpO2 95%   BMI 37.84 kg/m   Physical Exam Constitutional:      Appearance: Normal appearance.  HENT:     Head: Normocephalic and atraumatic.  Musculoskeletal:     Cervical back: Neck supple.  Skin:    Comments: Ulceration of R leg is smaller in size, no drainage noted.   Neurological:     Mental Status: He is alert.  Psychiatric:        Mood and Affect: Mood normal.        Behavior: Behavior normal.     ------------------------------------------------------------------------------------------------------------------------------------------------------------------------------------------------------------------- Assessment and Plan  Ulcer of left lower extremity (HCC) Leg is improved.  Continue dressing changes over the next week.  No indication for continued antibiotics.  He will let me know if not continuing to improve.     No orders of the defined types were placed in this encounter.   Return in about 2 weeks (around  01/04/2023) for F/u wound.    This visit occurred during the SARS-CoV-2 public health emergency.  Safety protocols were in place, including screening questions prior to the visit, additional usage of staff PPE, and extensive cleaning of exam room while observing appropriate contact time as indicated for disinfecting solutions.

## 2022-12-21 NOTE — Patient Instructions (Signed)
Continue regular dressing changes.  Let me know if not continuing to improve. Let's follow up in 2 weeks to be sure this is resolved.

## 2022-12-21 NOTE — Assessment & Plan Note (Addendum)
Leg is improved.  Continue dressing changes over the next week.  No indication for continued antibiotics.  He will let me know if not continuing to improve.

## 2022-12-30 DIAGNOSIS — L851 Acquired keratosis [keratoderma] palmaris et plantaris: Secondary | ICD-10-CM | POA: Diagnosis not present

## 2022-12-30 DIAGNOSIS — E1142 Type 2 diabetes mellitus with diabetic polyneuropathy: Secondary | ICD-10-CM | POA: Diagnosis not present

## 2022-12-30 DIAGNOSIS — B351 Tinea unguium: Secondary | ICD-10-CM | POA: Diagnosis not present

## 2022-12-31 ENCOUNTER — Ambulatory Visit: Payer: Medicare Other | Admitting: Orthopaedic Surgery

## 2022-12-31 ENCOUNTER — Encounter: Payer: Self-pay | Admitting: Orthopaedic Surgery

## 2022-12-31 VITALS — BP 113/69 | HR 80 | Ht 69.5 in | Wt 260.0 lb

## 2022-12-31 DIAGNOSIS — G5602 Carpal tunnel syndrome, left upper limb: Secondary | ICD-10-CM

## 2022-12-31 DIAGNOSIS — R200 Anesthesia of skin: Secondary | ICD-10-CM

## 2022-12-31 NOTE — Progress Notes (Signed)
Post-Op Visit Note   Patient: Gregory NEVINS [name]           Date of Birth: [birth year]           MRN: [id number] Visit Date: 12/31/2022 PCP: [name], DO   Assessment & Plan: Follow-up left carpal tunnel release 11/22/2022 he can apply some lotion there is mild peeling in the palm.  Opposite carpal tunnel was only mild and does not bother him a lot.  He did have triggering of the long finger.  If he gets recurrent triggering he will let us know he is already had 1 injection.  Chief Complaint:  Chief Complaint  Patient presents with   Left Hand - Follow-up    11/22/2022 Left CTR   Visit Diagnoses:  1. Bilateral hand numbness   2. Carpal tunnel syndrome, left upper limb   3.      Left long finger triggering  Plan: He will let us know if his right hand carpal tunnel gets worse or if he gets triggering of the long finger.  He is apt with results of surgery with the left hand with good sensation just some tenderness when he tries to open jars that are stuck.  Discussed that this should improve with time.  Follow-Up Instructions: No follow-ups on file.   Orders:  No orders of the defined types were placed in this encounter.  No orders of the defined types were placed in this encounter.   Imaging: No results found.  PMFS History: Patient Active Problem List   Diagnosis Date Noted   Ulcer of left lower extremity (HCC) 12/16/2022   Bilateral hand numbness 10/06/2022   Atypical pigmented lesion 09/29/2022   ED (erectile dysfunction) 09/29/2022   Fusion of spine of cervical region 09/14/2022   Bruising 09/08/2022   Acute tear of posterior aspect of lateral meniscus of left knee 01/29/2022   Bilateral third flexor stenosing tenosynovitis and first carpometacarpal osteoarthritis. 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   Hypertension associated with diabetes (HCC) 07/13/2017   Benign essential tremor 07/13/2017   Kidney cyst, acquired 01/24/2017   Seborrheic keratoses 04/01/2016   Diverticulosis of colon without hemorrhage 02/26/2016   Anxiety state 10/02/2015   BPH (benign prostatic hyperplasia) 07/31/2015   Carpal tunnel syndrome, left upper limb 06/04/2015   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 Normal cors 2001. b. Neg stress test 2012, 05/2014.   Diabetes mellitus without complication (HCC)    GERD (gastroesophageal reflux disease)    Hyperlipidemia    Hypertension    Hypertensive response to exercise    Hypothyroidism    Obesity    Obstructive sleep apnea    Currently untreated    PONV (postoperative nausea and vomiting)    Sleep apnea    Ulcer of left  lower extremity (HCC) 12/16/2022    Family History  Problem Relation Age of Onset   Diabetes Mother    Heart disease Mother    Hypertension Mother    Stroke Mother    Heart attack Mother    COPD Mother    Cancer Father    Heart disease Father    Diabetes Father    Hyperlipidemia Sister    Hypertension Sister    Diabetes Maternal Grandmother    Stroke Maternal Grandmother    Heart attack Maternal Grandmother    Heart disease Maternal Grandmother    Diabetes Maternal Grandfather    Hypertension Maternal Grandfather    Heart attack Maternal  Grandfather    Heart disease Maternal Grandfather    Diabetes Sister    Hypertension Sister    Cancer Sister    Hyperlipidemia Sister    Heart attack Paternal Grandmother    Heart attack Paternal Grandfather    Cancer Sister    Hyperlipidemia Sister    COPD Maternal Aunt    Hypertension Maternal Aunt    Diabetes Son    Diabetes Maternal Aunt     Past Surgical History:  Procedure Laterality Date   CARDIAC CATHETERIZATION     CERVICAL FUSION     HERNIA REPAIR     KNEE ARTHROSCOPY  1984   both knee   [facility]   SHOULDER ARTHROSCOPY WITH SUBACROMIAL DECOMPRESSION Right 09/13/2019   Procedure: RIGHT SHOULDER ARTHROSCOPY WITH EXTENSIVE DEBRIDEMENT, SUBACROMIAL DECOMPRESSION,;  Surgeon: Kathryne Hitch, MD;  Location: [facility];  Service: Orthopedics;  Laterality: Right;   SPINE SURGERY  1st Sept 1977   2nd Feb 2011   Social History   Occupational History    Comment: Retired  Tobacco Use   Smoking status: Former    Current packs/day: 0.00    Average packs/day: 1.5 packs/day for 30.0 years (45.0 ttl pk-yrs)    Types: Cigarettes    Start date: 06/01/1963    Quit date: 05/31/1993    Years since quitting: 29.6   Smokeless tobacco: Never   Tobacco comments:    24 years smoke free and alcohol free  Vaping Use   Vaping status: Never Used  Substance and Sexual Activity   Alcohol use: No   Drug use: No   Sexual activity: Yes    Birth control/protection: None    Comment: Not Necessary

## 2023-01-04 ENCOUNTER — Encounter: Payer: Self-pay | Admitting: Family Medicine

## 2023-01-04 ENCOUNTER — Ambulatory Visit (INDEPENDENT_AMBULATORY_CARE_PROVIDER_SITE_OTHER): Payer: Medicare Other | Admitting: Family Medicine

## 2023-01-04 VITALS — BP 127/81 | HR 73 | Ht 69.5 in | Wt 261.0 lb

## 2023-01-04 DIAGNOSIS — L97921 Non-pressure chronic ulcer of unspecified part of left lower leg limited to breakdown of skin: Secondary | ICD-10-CM

## 2023-01-04 DIAGNOSIS — Z7984 Long term (current) use of oral hypoglycemic drugs: Secondary | ICD-10-CM

## 2023-01-04 DIAGNOSIS — E1149 Type 2 diabetes mellitus with other diabetic neurological complication: Secondary | ICD-10-CM | POA: Diagnosis not present

## 2023-01-04 LAB — POCT GLYCOSYLATED HEMOGLOBIN (HGB A1C): HbA1c, POC (controlled diabetic range): 6.6 % (ref 0.0–7.0)

## 2023-01-04 NOTE — Progress Notes (Signed)
Gregory Ready [name] - 72 y.o. male MRN [id number]  Date of birth: 11-08-1950  Subjective Chief Complaint  Patient presents with   Wound Check    HPI Gregory West is a 72 year old male here today for follow-up of wound.  Reports he is doing well.  Wound has essentially resolved at this point.  He has been keeping feet elevated to reduce swelling.  He denies drainage from the area.  He has not seen his endocrinologist and several months.  His blood sugars have been fairly well-controlled past A1c.  A1c today is 6.6%..  ROS:  A comprehensive ROS was completed and negative except as noted per HPI  Allergies  Allergen Reactions   Statins     Muscle cramping all over body. Lipitor, Crestor, and pravastatin.   Atorvastatin     Other reaction(s): "severe cramps in muscles and tendons"   Rosuvastatin     Other reaction(s): "severe cramps in muscles and tendons"   Liraglutide Other (See Comments)    Caused pancreatitis Other reaction(s): pancreas infection   Lisinopril Cough    Past Medical History:  Diagnosis Date   Allergy not sure   Arthritis    Chest pain    a. Normal cors 2001. b. Neg stress test 2012, 05/2014.   Diabetes mellitus without complication (HCC)    GERD (gastroesophageal reflux disease)    Hyperlipidemia    Hypertension    Hypertensive response to exercise    Hypothyroidism    Obesity    Obstructive sleep apnea    Currently untreated    PONV (postoperative nausea and vomiting)    Sleep apnea    Ulcer of left lower extremity (HCC) 12/16/2022    Past Surgical History:  Procedure Laterality Date   CARDIAC CATHETERIZATION     CERVICAL FUSION     HERNIA REPAIR     KNEE ARTHROSCOPY  1984   both knee   [facility]   SHOULDER ARTHROSCOPY WITH SUBACROMIAL DECOMPRESSION Right 09/13/2019   Procedure: RIGHT SHOULDER ARTHROSCOPY WITH EXTENSIVE DEBRIDEMENT, SUBACROMIAL DECOMPRESSION,;  Surgeon: Kathryne Hitch, MD;  Location: [facility];  Service: Orthopedics;  Laterality: Right;   SPINE SURGERY  1st Sept 1977   2nd Feb 2011    Social History   Socioeconomic History   Marital status: Married    Spouse name: Gregory West   Number of children: 2   Years of education: 16   Highest education level: Bachelor's degree (e.g., BA, AB, BS)  Occupational History    Comment: Retired  Tobacco Use   Smoking status: Former    Current packs/day: 0.00    Average packs/day: 1.5 packs/day for 30.0 years (45.0 ttl pk-yrs)    Types: Cigarettes    Start date: 06/01/1963    Quit date: 05/31/1993    Years since quitting: 29.6   Smokeless tobacco: Never   Tobacco comments:    24 years smoke free and alcohol free  Vaping Use   Vaping status: Never Used  Substance and Sexual Activity   Alcohol use: No   Drug use: No   Sexual activity: Yes    Birth control/protection: None    Comment: Not Necessary  Other Topics Concern   Not on file  Social History Narrative   Lives with his wife. He has two children and three grand children. He likes to read his bible and go fishing in his free time.    Social Determinants of Health   Financial  Resource Strain: Low Risk  (09/16/2022)   Overall Financial Resource Strain (CARDIA)    Difficulty of Paying Living Expenses: Not very hard  Recent Concern: Financial Resource Strain - Medium Risk (08/27/2022)   Received from [facility], [facility]   Overall Financial Resource Strain (CARDIA)    Difficulty of Paying Living Expenses: Somewhat hard  Food Insecurity: Food Insecurity Present (09/16/2022)   Hunger Vital Sign    Worried About Running Out of Food in the Last Year: Sometimes true    Ran Out of Food in the Last Year: Never true  Transportation Needs: No Transportation Needs (09/16/2022)   PRAPARE - Administrator, Civil Service (Medical): No    Lack of Transportation (Non-Medical): No  Physical Activity: Sufficiently Active (09/16/2022)   Exercise Vital Sign    Days  of Exercise per Week: 3 days    Minutes of Exercise per Session: 90 min  Recent Concern: Physical Activity - Insufficiently Active (09/06/2022)   Exercise Vital Sign    Days of Exercise per Week: 3 days    Minutes of Exercise per Session: 30 min  Stress: No Stress Concern Present (09/16/2022)   [employer] of Occupational Health - Occupational Stress Questionnaire    Feeling of Stress : Only a little  Recent Concern: Stress - Stress Concern Present (09/06/2022)   [employer] of Occupational Health - Occupational Stress Questionnaire    Feeling of Stress : To some extent  Social Connections: Socially Integrated (09/20/2022)   Social Connection and Isolation Panel [NHANES]    Frequency of Communication with Friends and Family: Three times a week    Frequency of Social Gatherings with Friends and Family: Three times a week    Attends Religious Services: More than 4 times per year    Active Member of Clubs or Organizations: Yes    Attends Engineer, structural: More than 4 times per year    Marital Status: Married    Family History  Problem Relation Age of Onset   Diabetes Mother    Heart disease Mother    Hypertension Mother    Stroke Mother    Heart attack Mother    COPD Mother    Cancer Father    Heart disease Father    Diabetes Father    Hyperlipidemia Sister    Hypertension Sister    Diabetes Maternal Grandmother    Stroke Maternal Grandmother    Heart attack Maternal Grandmother    Heart disease Maternal Grandmother    Diabetes Maternal Grandfather    Hypertension Maternal Grandfather    Heart attack Maternal Grandfather    Heart disease Maternal Grandfather    Diabetes Sister    Hypertension Sister    Cancer Sister    Hyperlipidemia Sister    Heart attack Paternal Grandmother    Heart attack Paternal Grandfather    Cancer Sister    Hyperlipidemia Sister    COPD Maternal Aunt    Hypertension Maternal Aunt    Diabetes Son    Diabetes Maternal Aunt      Health Maintenance  Topic Date Due   Diabetic kidney evaluation - eGFR measurement  01/30/2023   COVID-19 Vaccine (5 - 2023-24 season) 03/26/2023 (Originally 01/29/2022)   INFLUENZA VACCINE  08/29/2023 (Originally 12/30/2022)   OPHTHALMOLOGY EXAM  06/12/2023   HEMOGLOBIN A1C  07/07/2023   FOOT EXAM  09/08/2023   Medicare Annual Wellness (AWV)  09/20/2023   Diabetic kidney evaluation - Urine ACR  12/21/2023  Colonoscopy  02/23/2026   DTaP/Tdap/Td (6 - Td or Tdap) 10/27/2032   Pneumonia Vaccine 40+ Years old  Completed   Hepatitis C Screening  Completed   Zoster Vaccines- Shingrix  Completed   HPV VACCINES  Aged Out     ----------------------------------------------------------------------------------------------------------------------------------------------------------------------------------------------------------------- Physical Exam BP 127/81 (BP Location: Right Arm, Patient Position: Sitting, Cuff Size: Large)   Pulse 73   Ht 5' 9.5" (1.765 m)   Wt 261 lb (118.4 kg)   SpO2 97%   BMI 37.99 kg/m   Physical Exam Constitutional:      Appearance: Normal appearance.  Cardiovascular:     Rate and Rhythm: Normal rate and regular rhythm.  Pulmonary:     Effort: Pulmonary effort is normal.     Breath sounds: Normal breath sounds.  Skin:    Comments: Wound to left lower extremity has healed nicely.  Trace to 1+ left lower extremity edema.  Neurological:     General: No focal deficit present.     Mental Status: He is alert.  Psychiatric:        Mood and Affect: Mood normal.        Behavior: Behavior normal.     ------------------------------------------------------------------------------------------------------------------------------------------------------------------------------------------------------------------- Assessment and Plan  Type 2 diabetes mellitus with neurological complications (HCC) Blood sugars remain pretty well-controlled.  He has appointment  with his endocrinologist next month.  Ulcer of left lower extremity (HCC) Area has healed completely.  Encouraged to use good moisturizer on legs to prevent skin cracking.    No orders of the defined types were placed in this encounter.   No follow-ups on file.    This visit occurred during the SARS-CoV-2 public health emergency.  Safety protocols were in place, including screening questions prior to the visit, additional usage of staff PPE, and extensive cleaning of exam room while observing appropriate contact time as indicated for disinfecting solutions.

## 2023-01-04 NOTE — Assessment & Plan Note (Signed)
Area has healed completely.  Encouraged to use good moisturizer on legs to prevent skin cracking.

## 2023-01-04 NOTE — Assessment & Plan Note (Signed)
Blood sugars remain pretty well-controlled.  He has appointment with his endocrinologist next month.

## 2023-01-05 NOTE — Progress Notes (Signed)
Patient ID: Gregory DATTA [name]                 DOB: February 14, 1951                    MRN: [id number]     HPI: Gregory FLORO [name] is a 72 y.o. male patient referred to lipid clinic by Dr. Shari Prows. PMH is significant for DM, OSA, HLD, AAA (small in 2019 at 3.0) and HTN.  Coronary CTA 07/2018 with normal coronary arteries. Ca score 0. Most recent A1c from [place] 6.6. Patient last saw Dr. Shari Prows on 09/03/22 and had not been taking rosuvastatin or ezetimibe. Lipid panel at that visit with LDL elevated at 144 and pt was instructed to restart rosuvastatin 5 mg and ezetimibe 10 mg. Follow up lipid panel LDL down to 110. [place] was then referred to pharmacy clinic for additional lipid management due to statin intolerances.     Current Medications: rosuvastatin 5 mg, ezetimibe 10 mg Intolerances: atorvastatin (muscle cramping), pravastatin (muscle cramping), higher dose of rosuvastatin (muscle cramping) Risk Factors: DM, HTN, HLD LDL goal: <70 mg/dl  Diet:   Exercise:   Family History:   Social History:   Labs:  12/07/22- TC 165, TG 88, HDL 38, LDL 110 - on rosuvastatin 5 mg, ezetimibe 10 mg  09/03/22- TC 200, TG 98, HDL 38, LDL 144 - no medications 06/22/22- TC 155, TG 92, HDL 37, LDL 100 - ?  Past Medical History:  Diagnosis Date   Allergy not sure   Arthritis    Chest pain    a. Normal cors 2001. b. Neg stress test 2012, 05/2014.   Diabetes mellitus without complication (HCC)    GERD (gastroesophageal reflux disease)    Hyperlipidemia    Hypertension    Hypertensive response to exercise    Hypothyroidism    Obesity    Obstructive sleep apnea    Currently untreated    PONV (postoperative nausea and vomiting)    Sleep apnea    Ulcer of left lower extremity (HCC) 12/16/2022    Current Outpatient Medications on File Prior to Visit  Medication Sig Dispense Refill   acetaminophen (TYLENOL) 650 MG CR tablet Take 1 tablet (650 mg total) by mouth every 8 (eight) hours as needed for  pain. 90 tablet 3   aspirin 81 MG tablet Take 81 mg by mouth daily.     carboxymethylcellulose (REFRESH PLUS) 0.5 % SOLN      celecoxib (CELEBREX) 200 MG capsule One to 2 tablets by mouth daily as needed for pain. 60 capsule 2   Cholecalciferol (VITAMIN D3) 5000 units CAPS Take 1 capsule by mouth daily.     clotrimazole-betamethasone (LOTRISONE) cream Apply 1 application. topically 2 (two) times daily. X 10 days 30 g 0   Continuous Glucose Sensor MISC by Does not apply route. dexacom     cyclobenzaprine (FLEXERIL) 10 MG tablet      Dulaglutide 3 MG/0.5ML SOPN Inject 3 mg into the skin once a week.     empagliflozin (JARDIANCE) 25 MG TABS tablet Take 1 tablet (25 mg total) by mouth daily. 90 tablet 3   ezetimibe (ZETIA) 10 MG tablet Take 1 tablet (10 mg total) by mouth daily. 90 tablet 4   insulin glargine-yfgn (SEMGLEE) 100 UNIT/ML injection 30 Units daily.     latanoprost (XALATAN) 0.005 % ophthalmic solution      levothyroxine (SYNTHROID) 25 MCG tablet Take 25 mcg by mouth daily before breakfast.  losartan (COZAAR) 25 MG tablet Take 1 tablet (25 mg total) by mouth daily. 90 tablet 4   metFORMIN (GLUCOPHAGE-XR) 750 MG 24 hr tablet TAKE 1 TABLET TWICE A DAY BEFORE MEALS 180 tablet 1   Omega-3 Fatty Acids (FISH OIL) 1000 MG CAPS Take by mouth. Takes 3 capsules a day     omeprazole (PRILOSEC) 40 MG capsule TAKE 1 CAPSULE (40 MG TOTAL) BY MOUTH DAILY. 90 capsule 3   ONETOUCH VERIO test strip 1 each by Other route as needed.     propranolol ER (INDERAL LA) 60 MG 24 hr capsule Take 1 capsule (60 mg total) by mouth daily. 90 capsule 3   rosuvastatin (CRESTOR) 5 MG tablet Take 1 tablet (5 mg total) by mouth daily. 90 tablet 4   tamsulosin (FLOMAX) 0.4 MG CAPS capsule TAKE 1 CAPSULE BY MOUTH EVERY DAY 90 capsule 1   traZODone (DESYREL) 50 MG tablet TAKE ONE-HALF (1/2) TO ONE TABLET AT BEDTIME AS NEEDED FOR SLEEP 90 tablet 3   vitamin B-12 (CYANOCOBALAMIN) 500 MCG tablet      No current  facility-administered medications on file prior to visit.    Allergies  Allergen Reactions   Statins     Muscle cramping all over body. Lipitor, Crestor, and pravastatin.   Atorvastatin     Other reaction(s): "severe cramps in muscles and tendons"   Rosuvastatin     Other reaction(s): "severe cramps in muscles and tendons"   Liraglutide Other (See Comments)    Caused pancreatitis Other reaction(s): pancreas infection   Lisinopril Cough    Assessment/Plan:  1. Hyperlipidemia -  Reviewed options for lowering LDL cholesterol, including statins, ezetimibe, PCSK9 inhibitors, Nexletol/Nexlizet, and Leqvio. Discussed mechanisms of action, dosing, side effects and potential decreases in LDL cholesterol. Also reviewed cost information and potential options for patient assistance.

## 2023-01-06 ENCOUNTER — Telehealth: Payer: Self-pay | Admitting: Pharmacist

## 2023-01-06 ENCOUNTER — Other Ambulatory Visit (HOSPITAL_COMMUNITY): Payer: Self-pay

## 2023-01-06 ENCOUNTER — Encounter: Payer: Self-pay | Admitting: Student

## 2023-01-06 ENCOUNTER — Ambulatory Visit: Payer: Medicare Other | Attending: Cardiology | Admitting: Student

## 2023-01-06 ENCOUNTER — Telehealth: Payer: Self-pay

## 2023-01-06 DIAGNOSIS — E785 Hyperlipidemia, unspecified: Secondary | ICD-10-CM

## 2023-01-06 NOTE — Progress Notes (Deleted)
Patient ID: Gregory LOVOS [name]                 DOB: Feb 04, 1951                    MRN: [id number]     HPI: Gregory WOLFROM [name] is a 72 y.o. male patient referred to lipid clinic by Dr. Shari Prows. PMH is significant for DM, OSA, HLD, AAA (small in 2019 at 3.0) and HTN.  Coronary CTA 07/2018 with normal coronary arteries. Ca score 0. Most recent A1c from [place] 6.6. Patient last saw Dr. Shari Prows on 09/03/22 and had not been taking rosuvastatin or ezetimibe. Lipid panel at that visit with LDL elevated at 144 and pt was instructed to restart rosuvastatin 5 mg and ezetimibe 10 mg. Follow up lipid panel LDL down to 110. Patient was then referred to pharmacy clinic for additional lipid management due to statin intolerances.   Pt presents to lipid clinic today doing well. Reports adherence to rosuvastatin 5 mg and ezetimibe with no issues. Previously had muscle cramps with rosuvastatin 20 mg, atorvastatin, and pravastatin. Although he cannot recall doses of pravastatin/atorvastatin. Pt noted his HDL was low on last lipid panel and asks for what he can do to improve this number. Reports he switched from Trulicity to [facility] for DM/weight management after weight loss plateau on Trulicity. Notes he has lost ~50 lbs since starting GLP-1 and has a goal weight of 225 lbs by December 2024. Of note, had pancreatitis with Victoza. Has been getting back to exercise routine over past few weeks. Previously limited by carpal tunnel surgery and infection on leg. Tries to eat a healthy diet, mainly cooking at home with vegetables.  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 and uses stairs 3-4 times daily  Family History:  Mother- MI at age 48, stroke at age 27 and 91, died at age of 23  Father - cancer, DM, heart disease, parkinson's   Sisters - brain cancer, both deceased   Social History: no alcohol (quit drinking 1987), no tobacco (quit smoking in 1995), no recreational drug use (quit 1985)  Labs:  12/07/22- TC 165, TG 88, HDL 38, LDL 110 - on rosuvastatin 5 mg, ezetimibe 10 mg  09/03/22- TC 200, TG 98, HDL 38, LDL 144 - no medications 06/22/22- TC 155, TG 92, HDL 37, LDL 100 - rosuvastatin 5 mg, ezetimibe 10 mg  Past Medical History:  Diagnosis Date   Allergy not sure   Arthritis    Chest pain    a. Normal cors 2001. b. Neg stress test 2012, 05/2014.   Diabetes mellitus without complication (HCC)    GERD (gastroesophageal reflux disease)    Hyperlipidemia    Hypertension    Hypertensive response to exercise    Hypothyroidism    Obesity    Obstructive sleep apnea    Currently untreated    PONV (postoperative nausea and vomiting)    Sleep apnea    Ulcer of left lower extremity (HCC) 12/16/2022    Current Outpatient Medications on  File Prior to Visit  Medication Sig Dispense Refill   acetaminophen (TYLENOL) 650 MG CR tablet Take 1 tablet (650 mg total) by mouth every 8 (eight) hours as needed for pain. 90 tablet 3   aspirin 81 MG tablet Take 81 mg by mouth daily.     carboxymethylcellulose (REFRESH PLUS) 0.5 % SOLN      celecoxib (CELEBREX) 200 MG capsule One to 2 tablets by mouth daily as needed for pain. 60 capsule 2   Cholecalciferol (VITAMIN D3) 5000 units CAPS Take 1 capsule by mouth daily.     clotrimazole-betamethasone (LOTRISONE) cream Apply 1 application. topically 2 (two) times daily. X 10 days 30 g 0   Continuous Glucose Sensor MISC by Does not apply route. dexacom     cyclobenzaprine (FLEXERIL) 10 MG tablet      Dulaglutide  3 MG/0.5ML SOPN Inject 3 mg into the skin once a week.     empagliflozin (JARDIANCE) 25 MG TABS tablet Take 1 tablet (25 mg total) by mouth daily. 90 tablet 3   ezetimibe (ZETIA) 10 MG tablet Take 1 tablet (10 mg total) by mouth daily. 90 tablet 4   insulin glargine-yfgn (SEMGLEE) 100 UNIT/ML injection 30 Units daily.     latanoprost (XALATAN) 0.005 % ophthalmic solution      levothyroxine (SYNTHROID) 25 MCG tablet Take 25 mcg by mouth daily before breakfast.     losartan (COZAAR) 25 MG tablet Take 1 tablet (25 mg total) by mouth daily. 90 tablet 4   metFORMIN (GLUCOPHAGE-XR) 750 MG 24 hr tablet TAKE 1 TABLET TWICE A DAY BEFORE MEALS 180 tablet 1   Omega-3 Fatty Acids (FISH OIL) 1000 MG CAPS Take by mouth. Takes 3 capsules a day     omeprazole (PRILOSEC) 40 MG capsule TAKE 1 CAPSULE (40 MG TOTAL) BY MOUTH DAILY. 90 capsule 3   ONETOUCH VERIO test strip 1 each by Other route as needed.     propranolol ER (INDERAL LA) 60 MG 24 hr capsule Take 1 capsule (60 mg total) by mouth daily. 90 capsule 3   rosuvastatin (CRESTOR) 5 MG tablet Take 1 tablet (5 mg total) by mouth daily. 90 tablet 4   tamsulosin (FLOMAX) 0.4 MG CAPS capsule TAKE 1 CAPSULE BY MOUTH EVERY DAY 90 capsule 1   traZODone (DESYREL) 50 MG tablet TAKE ONE-HALF (1/2) TO ONE TABLET AT BEDTIME AS NEEDED FOR SLEEP 90 tablet 3   vitamin B-12 (CYANOCOBALAMIN) 500 MCG tablet      No current facility-administered medications on file prior to visit.    Allergies  Allergen Reactions   Statins     Muscle cramping all over body. Lipitor, Crestor, and pravastatin.   Atorvastatin     Other reaction(s): "severe cramps in muscles and tendons"   Rosuvastatin     Other reaction(s): "severe cramps in muscles and tendons"   Liraglutide Other (See Comments)    Caused pancreatitis Other reaction(s): pancreas infection   Lisinopril Cough    Assessment/Plan:  1. Hyperlipidemia - Uncontrolled based on last LDL on 110 (12/07/22), above goal <70  mg/dl. As pt is on maximum tolerated rosuvastatin dose, intolerant of 2 other statins, and currently on ezetimibe plan to start injectable therapy for additional LDL lowering. Plan for initiation of Repatha or Praluent pending insurance benefits investigation. Encouraged pt to continue exercising and eating a healthy diet/cooking at home as this will help lower LDL and increase his HDL. Will follow up with pt after insurance update and send in RX  to preferred pharmacy. Follow up lipid panel ~8 weeks after starting PCSK9.   Adam Phenix, PharmD PGY-1 Pharmacy Resident

## 2023-01-06 NOTE — Telephone Encounter (Signed)
Pharmacy Patient Advocate Encounter  Received request from [employer] for PCSK9i request  (Update)  Received notification from [facility] that Prior Authorization for REPATHA has been  Approved  from 7.9.24 to 01/06/24   PA #/Case ID/Reference #: [id number]

## 2023-01-06 NOTE — Patient Instructions (Addendum)
Your Results:             Your most recent labs Goal  Total Cholesterol 155 < 200  Triglycerides 92 < 150  HDL (happy/good cholesterol) 37 > 40  LDL (lousy/bad cholesterol 100 < 70   Medication changes: We will start the process to get PCSK9i (Repatha or Praluent) covered by your insurance.  Once the prior authorization is complete, we will call you to let you know and confirm pharmacy information.     Praluent is a cholesterol medication that improved your body's ability to get rid of "bad cholesterol" known as LDL. It can lower your LDL up to 60%. It is an injection that is given under the skin every 2 weeks. The most common side effects of Praluent include runny nose, symptoms of the common cold, rarely flu or flu-like symptoms, back/muscle pain in about 3-4% of the patients, and redness, pain, or bruising at the injection site.    Repatha is a cholesterol medication that improved your body's ability to get rid of "bad cholesterol" known as LDL. It can lower your LDL up to 60%! It is an injection that is given under the skin every 2 weeks. The most common side effects of Repatha include runny nose, symptoms of the common cold, rarely flu or flu-like symptoms, back/muscle pain in about 3-4% of the patients, and redness, pain, or bruising at the injection site.   Lab orders: We want to repeat labs after 2-3 months.  We will send you a lab order to remind you once we get closer to that time.

## 2023-01-07 MED ORDER — REPATHA SURECLICK 140 MG/ML ~~LOC~~ SOAJ: 140.0000 mg | SUBCUTANEOUS | 3 refills | Status: DC

## 2023-01-07 NOTE — Telephone Encounter (Signed)
PA has been apporved

## 2023-01-07 NOTE — Addendum Note (Signed)
Addended by: Tylene Fantasia on: 01/07/2023 12:29 PM   Modules accepted: Orders

## 2023-01-07 NOTE — Telephone Encounter (Signed)
Patient has been informed via MyChart

## 2023-01-11 ENCOUNTER — Ambulatory Visit: Payer: Medicare Other | Admitting: Family Medicine

## 2023-01-11 ENCOUNTER — Encounter: Payer: Self-pay | Admitting: Family Medicine

## 2023-01-11 VITALS — BP 126/63 | HR 75 | Resp 18 | Ht 69.5 in | Wt 260.5 lb

## 2023-01-11 DIAGNOSIS — T63441A Toxic effect of venom of bees, accidental (unintentional), initial encounter: Secondary | ICD-10-CM | POA: Diagnosis not present

## 2023-01-11 MED ORDER — AMOXICILLIN-POT CLAVULANATE 875-125 MG PO TABS: 1.0000 | ORAL_TABLET | Freq: Two times a day (BID) | ORAL | 0 refills | Status: AC

## 2023-01-11 MED ORDER — DIPHENHYDRAMINE-ZINC ACETATE 1-0.1 % EX CREA: TOPICAL_CREAM | Freq: Three times a day (TID) | CUTANEOUS | 0 refills | Status: AC | PRN

## 2023-01-11 MED ORDER — PREDNISONE 20 MG PO TABS
20.0000 mg | ORAL_TABLET | Freq: Every day | ORAL | 0 refills | Status: AC
Start: 2023-01-11 — End: 2023-01-14

## 2023-01-11 NOTE — Patient Instructions (Addendum)
Recommend  - take prednisone for 3 days - take antibiotic for 5 days - increase water intake  - use ice as needed for pain and swelling - take benadryl as needed for swelling

## 2023-01-11 NOTE — Assessment & Plan Note (Addendum)
-   pt presents after being stung by a hornets nest. He was cutting grass and rolled into a bush and was stung. From exam, all stingers seem to be out. Have done a hibiclens soak to help with extracting stingers still stuck in there. None present. Significant swelling and pain present of left hand, abdomen and back. Due to significant pain and swelling with risk of infection will go ahead and do augmentin for 5 days and then do steroids for 3 days--no longer due to hx of diabetes. Recent A1c was 6.6  - follow up in 5 days for improvement - recommend taking benadryl and using cool packs - have also sent in benadryl cream to use - pt given benadryl and famotidine in clinic

## 2023-01-11 NOTE — Progress Notes (Signed)
Acute Office Visit  Subjective:     Patient ID: Gregory Ready [name], male    DOB: [birth year], 72 y.o.   MRN: [id number]  Chief Complaint  Patient presents with   Insect Bite    Pt was cutting grass today and ran over a bees nest     HPI Patient is in today for sting by hornets nest.   Review of Systems  Constitutional:  Negative for chills and fever.  Respiratory:  Negative for cough and shortness of breath.   Cardiovascular:  Negative for chest pain.  Skin:        swelling  Neurological:  Negative for headaches.        Objective:    BP 126/63 (BP Location: Left Arm, Patient Position: Sitting, Cuff Size: Large)   Pulse 75   Resp 18   Ht 5' 9.5" (1.765 m)   Wt 260 lb 8 oz (118.2 kg)   SpO2 97%   BMI 37.92 kg/m    Physical Exam Vitals and nursing note reviewed.  Constitutional:      General: He is not in acute distress.    Appearance: Normal appearance.  HENT:     Head: Normocephalic and atraumatic.     Right Ear: External ear normal.     Left Ear: External ear normal.     Nose: Nose normal.  Eyes:     Conjunctiva/sclera: Conjunctivae normal.  Cardiovascular:     Rate and Rhythm: Normal rate and regular rhythm.  Pulmonary:     Effort: Pulmonary effort is normal.     Breath sounds: Normal breath sounds.  Skin:    Comments: Swelling of left hand with tenderness to palpation of thenar eminence. Baseball sized nodule present on left abdomen where he was also stung. Spot on his back-about size of golf ball where another stinging occurred  Neurological:     General: No focal deficit present.     Mental Status: He is alert and oriented to person, place, and time.  Psychiatric:        Mood and Affect: Mood normal.        Behavior: Behavior normal.        Thought Content: Thought content normal.        Judgment: Judgment normal.     No results found for any visits on 01/11/23.      Assessment & Plan:   Problem List Items Addressed This Visit        Other   Bee sting reaction - Primary    - pt presents after being stung by a hornets nest. He was cutting grass and rolled into a bush and was stung. From exam, all stingers seem to be out. Have done a hibiclens soak to help with extracting stingers still stuck in there. None present. Significant swelling and pain present of left hand, abdomen and back. Due to significant pain and swelling with risk of infection will go ahead and do augmentin for 5 days and then do steroids for 3 days--no longer due to hx of diabetes. Recent A1c was 6.6  - follow up in 5 days for improvement - recommend taking benadryl and using cool packs - have also sent in benadryl cream to use - pt given benadryl and famotidine in clinic      Relevant Medications   predniSONE (DELTASONE) 20 MG tablet   amoxicillin-clavulanate (AUGMENTIN) 875-125 MG tablet    Meds ordered this encounter  Medications   predniSONE (DELTASONE) 20  MG tablet    Sig: Take 1 tablet (20 mg total) by mouth daily with breakfast for 3 days.    Dispense:  3 tablet    Refill:  0   amoxicillin-clavulanate (AUGMENTIN) 875-125 MG tablet    Sig: Take 1 tablet by mouth 2 (two) times daily for 5 days.    Dispense:  10 tablet    Refill:  0   diphenhydrAMINE-zinc acetate (BENADRYL) cream    Sig: Apply topically 3 (three) times daily as needed for itching.    Dispense:  28.3 g    Refill:  0    Return in about 6 days (around 01/17/2023) for with PCP-ok to use same day slot per dr Ashley Royalty.  Charlton Amor, DO

## 2023-01-17 ENCOUNTER — Ambulatory Visit: Payer: Medicare Other | Admitting: Family Medicine

## 2023-01-18 ENCOUNTER — Telehealth: Payer: Self-pay | Admitting: Family Medicine

## 2023-01-18 NOTE — Telephone Encounter (Signed)
Thanks for the update.  He can follow up as needed for this.   CM

## 2023-01-18 NOTE — Telephone Encounter (Signed)
Pt called. She states that he missed his appointment but itching and swelling has gone away.

## 2023-01-20 NOTE — Telephone Encounter (Signed)
Patient informed. 

## 2023-01-21 NOTE — Telephone Encounter (Signed)
Paper Work Dropped Off: APPLICATION   Date:01/21/2023  Location of paper: IN [place]

## 2023-01-25 NOTE — Telephone Encounter (Signed)
**Note De-Identified Lurae Hornbrook Obfuscation** The application is for [employer] assistance. Application e-mail has been forwarded Jaqwon Manfred e-mail to [employer], RPH and Malena Peer, [facility].

## 2023-01-25 NOTE — Telephone Encounter (Signed)
Pt's patient assistant application was scanned to [employer], [employer]. FYI

## 2023-01-26 ENCOUNTER — Telehealth: Payer: Self-pay | Admitting: Pharmacist

## 2023-01-26 MED ORDER — REPATHA SURECLICK 140 MG/ML ~~LOC~~ SOAJ: 140.0000 mg | SUBCUTANEOUS | 3 refills | Status: DC

## 2023-01-26 NOTE — Telephone Encounter (Signed)
Application faxed to [employer] safety net on Aug 28,2024

## 2023-01-26 NOTE — Telephone Encounter (Signed)
[employer] Patient assistance application faxed on Aug 28,2024.

## 2023-01-27 NOTE — Telephone Encounter (Signed)
HW grant re-opened for renewal of previously enrolled people. It patient assistance application does not get accepted we can provide some samples and enroll patient in grant when it open for new patient in almost a month.  Patient made aware via phone.

## 2023-01-28 ENCOUNTER — Other Ambulatory Visit (HOSPITAL_COMMUNITY): Payer: Self-pay

## 2023-01-28 NOTE — Telephone Encounter (Signed)
Dorine with [employer] called in stating they need some additional info for patient Assistance   Proof of income from pt - she states it could be 2024 social security benefits for self and spouse Prior auth from pt's rx plan    Fax: [phone]

## 2023-01-28 NOTE — Telephone Encounter (Signed)
Spoke to patient, [employer] called patient too. Patient will be submitting income proof via e-mail and we will fax the PA approval.

## 2023-02-02 ENCOUNTER — Encounter: Payer: Self-pay | Admitting: Family Medicine

## 2023-02-02 ENCOUNTER — Telehealth (INDEPENDENT_AMBULATORY_CARE_PROVIDER_SITE_OTHER): Payer: Medicare Other | Admitting: Family Medicine

## 2023-02-02 VITALS — Temp 98.0°F | Ht 69.5 in | Wt 260.5 lb

## 2023-02-02 DIAGNOSIS — U071 COVID-19: Secondary | ICD-10-CM | POA: Insufficient documentation

## 2023-02-02 MED ORDER — NIRMATRELVIR/RITONAVIR (PAXLOVID)TABLET: 3.0000 | ORAL_TABLET | Freq: Two times a day (BID) | ORAL | 0 refills | Status: AC

## 2023-02-02 NOTE — Telephone Encounter (Signed)
[employer] has requested prior authorization letter copy.Sent on 02/02/2023

## 2023-02-02 NOTE — Progress Notes (Signed)
Gregory Ready [name] - 72 y.o. male MRN [id number]  Date of birth: [birth year]   This visit type was conducted due to national recommendations for restrictions regarding the COVID-19 Pandemic (e.g. social distancing).  This format is felt to be most appropriate for this patient at this time.  All issues noted in this document were discussed and addressed.  No physical exam was performed (except for noted visual exam findings with Video Visits).  I discussed the limitations of evaluation and management by telemedicine and the availability of in person appointments. The patient expressed understanding and agreed to proceed.  I connected withNAME@ on 02/02/23 at  3:10 PM EDT by a video enabled telemedicine application and verified that I am speaking with the correct person using two identifiers.  Present at visit: [name], DO Gregory Ready [name]   Patient Location: Home [address] [place] [address]   Provider location:   [facility]  Chief Complaint  Patient presents with   Covid Positive    HPI  [name] is a 72 y.o. male who presents via audio/video conferencing for a telehealth visit today.  He reports that he started having GI upset with nausea yesterday.  Woke up this morning with worsened nausea and diarrhea as well as congestion, fatigue, and cough.  Denies fever, chills, dyspnea.  He has not tried anything so far for treatment so far.    ROS:  A comprehensive ROS was completed and negative except as noted per HPI  Past Medical History:  Diagnosis Date   Allergy not sure   Arthritis    Chest pain    a. Normal cors 2001. b. Neg stress test 2012, 05/2014.   Diabetes mellitus without complication (HCC)    GERD (gastroesophageal reflux disease)    Hyperlipidemia    Hypertension    Hypertensive response to exercise    Hypothyroidism    Obesity    Obstructive sleep apnea    Currently untreated    PONV (postoperative nausea and  vomiting)    Sleep apnea    Ulcer of left lower extremity (HCC) 12/16/2022    Past Surgical History:  Procedure Laterality Date   CARDIAC CATHETERIZATION     CERVICAL FUSION     HERNIA REPAIR     KNEE ARTHROSCOPY  1984   both knee   [facility]   SHOULDER ARTHROSCOPY WITH SUBACROMIAL DECOMPRESSION Right 09/13/2019   Procedure: RIGHT SHOULDER ARTHROSCOPY WITH EXTENSIVE DEBRIDEMENT, SUBACROMIAL DECOMPRESSION,;  Surgeon: Kathryne Hitch, MD;  Location: [facility];  Service: Orthopedics;  Laterality: Right;   SPINE SURGERY  1st Sept 1977   2nd Feb 2011    Family History  Problem Relation Age of Onset   Diabetes Mother    Heart disease Mother    Hypertension Mother    Stroke Mother    Heart attack Mother    COPD Mother    Cancer Father    Heart disease Father    Diabetes Father    Hyperlipidemia Sister    Hypertension Sister    Diabetes Maternal Grandmother    Stroke Maternal Grandmother    Heart attack Maternal Grandmother    Heart disease Maternal Grandmother    Diabetes Maternal Grandfather    Hypertension Maternal Grandfather    Heart attack Maternal Grandfather    Heart disease Maternal Grandfather    Diabetes Sister    Hypertension Sister    Cancer Sister  Hyperlipidemia Sister    Heart attack Paternal Grandmother    Heart attack Paternal Grandfather    Cancer Sister    Hyperlipidemia Sister    COPD Maternal Aunt    Hypertension Maternal Aunt    Diabetes Son    Diabetes Maternal Aunt     Social History   Socioeconomic History   Marital status: Married    Spouse name: [name]   Number of children: 2   Years of education: 16   Highest education level: Bachelor's degree (e.g., BA, AB, BS)  Occupational History    Comment: Retired  Tobacco Use   Smoking status: Former    Current packs/day: 0.00    Average packs/day: 1.5 packs/day for 30.0 years (45.0 ttl pk-yrs)    Types: Cigarettes    Start date:  06/01/1963    Quit date: 05/31/1993    Years since quitting: 29.6   Smokeless tobacco: Never   Tobacco comments:    24 years smoke free and alcohol free  Vaping Use   Vaping status: Never Used  Substance and Sexual Activity   Alcohol use: No   Drug use: No   Sexual activity: Yes    Birth control/protection: None    Comment: Not Necessary  Other Topics Concern   Not on file  Social History Narrative   Lives with his wife. He has two children and three grand children. He likes to read his bible and go fishing in his free time.    Social Determinants of Health   Financial Resource Strain: Low Risk  (09/16/2022)   Overall Financial Resource Strain (CARDIA)    Difficulty of Paying Living Expenses: Not very hard  Recent Concern: Financial Resource Strain - Medium Risk (08/27/2022)   Received [facility]   Overall Financial Resource Strain (CARDIA)    Difficulty of Paying Living Expenses: Somewhat hard  Food Insecurity: Food Insecurity Present (09/16/2022)   Hunger Vital Sign    Worried About Running Out of Food in the Last Year: Sometimes true    Ran Out of Food in the Last Year: Never true  Transportation Needs: No Transportation Needs (09/16/2022)   PRAPARE - Administrator, Civil Service (Medical): No    Lack of Transportation (Non-Medical): No  Physical Activity: Sufficiently Active (09/16/2022)   Exercise Vital Sign    Days of Exercise per Week: 3 days    Minutes of Exercise per Session: 90 min  Recent Concern: Physical Activity - Insufficiently Active (09/06/2022)   Exercise Vital Sign    Days of Exercise per Week: 3 days    Minutes of Exercise per Session: 30 min  Stress: No Stress Concern Present (09/16/2022)   [employer] of Occupational Health - Occupational Stress Questionnaire    Feeling of Stress : Only a little  Recent Concern: Stress - Stress Concern Present (09/06/2022)   [employer] of Occupational Health - Occupational Stress  Questionnaire    Feeling of Stress : To some extent  Social Connections: Socially Integrated (09/20/2022)   Social Connection and Isolation Panel [NHANES]    Frequency of Communication with Friends and Family: Three times a week    Frequency of Social Gatherings with Friends and Family: Three times a week    Attends Religious Services: More than 4 times per year    Active Member of Clubs or Organizations: Yes    Attends Banker Meetings: More than 4 times per year    Marital Status: Married  Intimate Partner Violence: Not At Risk (09/20/2022)   Humiliation, Afraid, Rape, and Kick questionnaire    Fear of Current or Ex-Partner: No    Emotionally Abused: No    Physically Abused: No    Sexually Abused: No     Current Outpatient Medications:    acetaminophen (TYLENOL) 650 MG CR tablet, Take 1 tablet (650 mg total) by mouth every 8 (eight) hours as needed for pain., Disp: 90 tablet, Rfl: 3   aspirin 81 MG tablet, Take 81 mg by mouth daily., Disp: , Rfl:    carboxymethylcellulose (REFRESH PLUS) 0.5 % SOLN, , Disp: , Rfl:    celecoxib (CELEBREX) 200 MG capsule, One to 2 tablets by mouth daily as needed for pain., Disp: 60 capsule, Rfl: 2   Cholecalciferol (VITAMIN D3) 5000 units CAPS, Take 1 capsule by mouth daily., Disp: , Rfl:    clotrimazole-betamethasone (LOTRISONE) cream, Apply 1 application. topically 2 (two) times daily. X 10 days, Disp: 30 g, Rfl: 0   Continuous Glucose Sensor MISC, by Does not apply route. dexacom, Disp: , Rfl:    cyclobenzaprine (FLEXERIL) 10 MG tablet, , Disp: , Rfl:    diphenhydrAMINE-zinc acetate (BENADRYL) cream, Apply topically 3 (three) times daily as needed for itching., Disp: 28.3 g, Rfl: 0   Dulaglutide 3 MG/0.5ML SOPN, Inject 3 mg into the skin once a week., Disp: , Rfl:    empagliflozin (JARDIANCE) 25 MG TABS tablet, Take 1 tablet (25 mg total) by mouth daily., Disp: 90 tablet, Rfl: 3   Evolocumab (REPATHA SURECLICK) 140 MG/ML SOAJ, Inject 140  mg into the skin every 14 (fourteen) days., Disp: 6 mL, Rfl: 3   ezetimibe (ZETIA) 10 MG tablet, Take 1 tablet (10 mg total) by mouth daily., Disp: 90 tablet, Rfl: 4   insulin glargine-yfgn (SEMGLEE) 100 UNIT/ML injection, 30 Units daily., Disp: , Rfl:    latanoprost (XALATAN) 0.005 % ophthalmic solution, , Disp: , Rfl:    levothyroxine (SYNTHROID) 25 MCG tablet, Take 25 mcg by mouth daily before breakfast., Disp: , Rfl:    losartan (COZAAR) 25 MG tablet, Take 1 tablet (25 mg total) by mouth daily., Disp: 90 tablet, Rfl: 4   metFORMIN (GLUCOPHAGE-XR) 750 MG 24 hr tablet, TAKE 1 TABLET TWICE A DAY BEFORE MEALS, Disp: 180 tablet, Rfl: 1   nirmatrelvir/ritonavir (PAXLOVID) 20 x 150 MG & 10 x 100MG  TABS, Take 3 tablets by mouth 2 (two) times daily for 5 days. (Take nirmatrelvir 150 mg two tablets twice daily for 5 days and ritonavir 100 mg one tablet twice daily for 5 days) Patient GFR is >90, Disp: 30 tablet, Rfl: 0   Omega-3 Fatty Acids (FISH OIL) 1000 MG CAPS, Take by mouth. Takes 3 capsules a day, Disp: , Rfl:    omeprazole (PRILOSEC) 40 MG capsule, TAKE 1 CAPSULE (40 MG TOTAL) BY MOUTH DAILY., Disp: 90 capsule, Rfl: 3   ONETOUCH VERIO test strip, 1 each by Other route as needed., Disp: , Rfl:    propranolol ER (INDERAL LA) 60 MG 24 hr capsule, Take 1 capsule (60 mg total) by mouth daily., Disp: 90 capsule, Rfl: 3   rosuvastatin (CRESTOR) 5 MG tablet, Take 1 tablet (5 mg total) by mouth daily., Disp: 90 tablet, Rfl: 4   tamsulosin (FLOMAX) 0.4 MG CAPS capsule, TAKE 1 CAPSULE BY MOUTH EVERY DAY, Disp: 90 capsule, Rfl: 1   traZODone (DESYREL) 50 MG tablet, TAKE ONE-HALF (1/2) TO ONE TABLET AT BEDTIME AS NEEDED FOR SLEEP, Disp: 90 tablet, Rfl:  3   vitamin B-12 (CYANOCOBALAMIN) 500 MCG tablet, , Disp: , Rfl:   EXAM:  VITALS per patient if applicable: Temp 98 F (36.7 C)   Ht 5' 9.5" (1.765 m)   Wt 260 lb 8 oz (118.2 kg)   BMI 37.92 kg/m   GENERAL: alert, oriented, appears well and in no acute  distress  HEENT: atraumatic, conjunttiva clear, no obvious abnormalities on inspection of external nose and ears  NECK: normal movements of the head and neck  LUNGS: on inspection no signs of respiratory distress, breathing rate appears normal, no obvious gross SOB, gasping or wheezing  CV: no obvious cyanosis  MS: moves all visible extremities without noticeable abnormality  PSYCH/NEURO: pleasant and cooperative, no obvious depression or anxiety, speech and thought processing grossly intact  ASSESSMENT AND PLAN:  Discussed the following assessment and plan:  COVID Recommend continued supportive care.  Increased fluids, OTC medications for cough, fever, aches, chills. Adding Paxlovid.  Hold crestor while taking Paxlovid. Red flags with reasons to follow up or seek emergency care reviewed.      I discussed the assessment and treatment plan with the patient. The patient was provided an opportunity to ask questions and all were answered. The patient agreed with the plan and demonstrated an understanding of the instructions.   The patient was advised to call back or seek an in-person evaluation if the symptoms worsen or if the condition fails to improve as anticipated.    [name], DO

## 2023-02-02 NOTE — Progress Notes (Signed)
Nausea yesterday. Symptoms started this morning, 02/02/23.  Emesis, diarrhea, dizziness. Tested today @ 11am.

## 2023-02-02 NOTE — Assessment & Plan Note (Signed)
Recommend continued supportive care.  Increased fluids, OTC medications for cough, fever, aches, chills. Adding Paxlovid.  Hold crestor while taking Paxlovid. Red flags with reasons to follow up or seek emergency care reviewed.

## 2023-02-07 DIAGNOSIS — E1165 Type 2 diabetes mellitus with hyperglycemia: Secondary | ICD-10-CM | POA: Diagnosis not present

## 2023-02-07 DIAGNOSIS — E1159 Type 2 diabetes mellitus with other circulatory complications: Secondary | ICD-10-CM | POA: Diagnosis not present

## 2023-02-07 DIAGNOSIS — Z794 Long term (current) use of insulin: Secondary | ICD-10-CM | POA: Diagnosis not present

## 2023-02-07 DIAGNOSIS — I152 Hypertension secondary to endocrine disorders: Secondary | ICD-10-CM | POA: Diagnosis not present

## 2023-02-07 DIAGNOSIS — E559 Vitamin D deficiency, unspecified: Secondary | ICD-10-CM | POA: Diagnosis not present

## 2023-02-14 LAB — HM DIABETES EYE EXAM

## 2023-02-17 DIAGNOSIS — Z794 Long term (current) use of insulin: Secondary | ICD-10-CM | POA: Diagnosis not present

## 2023-02-17 DIAGNOSIS — E669 Obesity, unspecified: Secondary | ICD-10-CM | POA: Diagnosis not present

## 2023-02-17 DIAGNOSIS — E1169 Type 2 diabetes mellitus with other specified complication: Secondary | ICD-10-CM | POA: Diagnosis not present

## 2023-02-17 DIAGNOSIS — R7989 Other specified abnormal findings of blood chemistry: Secondary | ICD-10-CM | POA: Diagnosis not present

## 2023-02-17 DIAGNOSIS — E1142 Type 2 diabetes mellitus with diabetic polyneuropathy: Secondary | ICD-10-CM | POA: Diagnosis not present

## 2023-02-17 DIAGNOSIS — Z978 Presence of other specified devices: Secondary | ICD-10-CM | POA: Diagnosis not present

## 2023-02-17 DIAGNOSIS — E1159 Type 2 diabetes mellitus with other circulatory complications: Secondary | ICD-10-CM | POA: Diagnosis not present

## 2023-02-17 DIAGNOSIS — E559 Vitamin D deficiency, unspecified: Secondary | ICD-10-CM | POA: Diagnosis not present

## 2023-02-17 DIAGNOSIS — E1165 Type 2 diabetes mellitus with hyperglycemia: Secondary | ICD-10-CM | POA: Diagnosis not present

## 2023-02-17 DIAGNOSIS — Z6836 Body mass index (BMI) 36.0-36.9, adult: Secondary | ICD-10-CM | POA: Diagnosis not present

## 2023-02-17 DIAGNOSIS — E11649 Type 2 diabetes mellitus with hypoglycemia without coma: Secondary | ICD-10-CM | POA: Diagnosis not present

## 2023-03-08 ENCOUNTER — Ambulatory Visit (INDEPENDENT_AMBULATORY_CARE_PROVIDER_SITE_OTHER): Payer: Medicare Other | Admitting: Family Medicine

## 2023-03-08 ENCOUNTER — Encounter: Payer: Self-pay | Admitting: Family Medicine

## 2023-03-08 VITALS — BP 120/74 | HR 71 | Ht 69.5 in | Wt 260.5 lb

## 2023-03-08 DIAGNOSIS — Z23 Encounter for immunization: Secondary | ICD-10-CM | POA: Diagnosis not present

## 2023-03-08 DIAGNOSIS — T148XXA Other injury of unspecified body region, initial encounter: Secondary | ICD-10-CM | POA: Insufficient documentation

## 2023-03-08 MED ORDER — CEPHALEXIN 500 MG PO CAPS: 500.0000 mg | ORAL_CAPSULE | Freq: Four times a day (QID) | ORAL | 0 refills | Status: DC

## 2023-03-08 NOTE — Patient Instructions (Signed)
Do warm epsom salt soaks daily.  Start cephalexin antibiotic.

## 2023-03-08 NOTE — Addendum Note (Signed)
Addended by: Ardyth Man on: 03/08/2023 03:50 PM   Modules accepted: Orders

## 2023-03-08 NOTE — Progress Notes (Signed)
Gregory Ready [name] - 72 y.o. male MRN [id number]  Date of birth: 09-05-1950  Subjective Chief Complaint  Patient presents with   Foreign Body in Skin    HPI Gregory MCDANIEL [name] Is a 72 y.o. male here today for foreign body removal.  He was removing a cactus from his yard and one of the thorns broke off in his hand.  He has some pain and swelling around the area.  Denies fever or chills.  No drainage from the area.    ROS:  A comprehensive ROS was completed and negative except as noted per HPI  Allergies  Allergen Reactions   Statins     Muscle cramping all over body. Lipitor, Crestor, and pravastatin.   Atorvastatin     Other reaction(s): "severe cramps in muscles and tendons"   Rosuvastatin     Other reaction(s): "severe cramps in muscles and tendons"   Liraglutide Other (See Comments)    Caused pancreatitis Other reaction(s): pancreas infection   Lisinopril Cough    Past Medical History:  Diagnosis Date   Allergy not sure   Arthritis    Chest pain    a. Normal cors 2001. b. Neg stress test 2012, 05/2014.   Diabetes mellitus without complication (HCC)    GERD (gastroesophageal reflux disease)    Hyperlipidemia    Hypertension    Hypertensive response to exercise    Hypothyroidism    Obesity    Obstructive sleep apnea    Currently untreated    PONV (postoperative nausea and vomiting)    Sleep apnea    Ulcer of left lower extremity (HCC) 12/16/2022    Past Surgical History:  Procedure Laterality Date   CARDIAC CATHETERIZATION     CERVICAL FUSION     HERNIA REPAIR     KNEE ARTHROSCOPY  1984   both knee   [facility]   SHOULDER ARTHROSCOPY WITH SUBACROMIAL DECOMPRESSION Right 09/13/2019   Procedure: RIGHT SHOULDER ARTHROSCOPY WITH EXTENSIVE DEBRIDEMENT, SUBACROMIAL DECOMPRESSION,;  Surgeon: Kathryne Hitch, MD;  Location: [facility];  Service: Orthopedics;  Laterality: Right;   SPINE SURGERY  1st Sept 1977    2nd Feb 2011    Social History   Socioeconomic History   Marital status: Married    Spouse name: [name]   Number of children: 2   Years of education: 16   Highest education level: Bachelor's degree (e.g., BA, AB, BS)  Occupational History    Comment: Retired  Tobacco Use   Smoking status: Former    Current packs/day: 0.00    Average packs/day: 1.5 packs/day for 30.0 years (45.0 ttl pk-yrs)    Types: Cigarettes    Start date: 06/01/1963    Quit date: 05/31/1993    Years since quitting: 29.7   Smokeless tobacco: Never   Tobacco comments:    24 years smoke free and alcohol free  Vaping Use   Vaping status: Never Used  Substance and Sexual Activity   Alcohol use: No   Drug use: No   Sexual activity: Yes    Birth control/protection: None    Comment: Not Necessary  Other Topics Concern   Not on file  Social History Narrative   Lives with his wife. He has two children and three grand children. He likes to read his bible and go fishing in his free time.    Social Determinants of Health   Financial Resource Strain: Low Risk  (09/16/2022)  Overall Financial Resource Strain (CARDIA)    Difficulty of Paying Living Expenses: Not very hard  Recent Concern: Financial Resource Strain - Medium Risk (08/27/2022)   Received from [facility]   Overall Financial Resource Strain (CARDIA)    Difficulty of Paying Living Expenses: Somewhat hard  Food Insecurity: Food Insecurity Present (09/16/2022)   Hunger Vital Sign    Worried About Running Out of Food in the Last Year: Sometimes true    Ran Out of Food in the Last Year: Never true  Transportation Needs: No Transportation Needs (09/16/2022)   PRAPARE - Administrator, Civil Service (Medical): No    Lack of Transportation (Non-Medical): No  Physical Activity: Sufficiently Active (09/16/2022)   Exercise Vital Sign    Days of Exercise per Week: 3 days    Minutes of Exercise per Session: 90 min  Recent Concern:  Physical Activity - Insufficiently Active (09/06/2022)   Exercise Vital Sign    Days of Exercise per Week: 3 days    Minutes of Exercise per Session: 30 min  Stress: No Stress Concern Present (09/16/2022)   [employer] of Occupational Health - Occupational Stress Questionnaire    Feeling of Stress : Only a little  Recent Concern: Stress - Stress Concern Present (09/06/2022)   [employer] of Occupational Health - Occupational Stress Questionnaire    Feeling of Stress : To some extent  Social Connections: Socially Integrated (09/20/2022)   Social Connection and Isolation Panel [NHANES]    Frequency of Communication with Friends and Family: Three times a week    Frequency of Social Gatherings with Friends and Family: Three times a week    Attends Religious Services: More than 4 times per year    Active Member of Clubs or Organizations: Yes    Attends Engineer, structural: More than 4 times per year    Marital Status: Married    Family History  Problem Relation Age of Onset   Diabetes Mother    Heart disease Mother    Hypertension Mother    Stroke Mother    Heart attack Mother    COPD Mother    Cancer Father    Heart disease Father    Diabetes Father    Hyperlipidemia Sister    Hypertension Sister    Diabetes Maternal Grandmother    Stroke Maternal Grandmother    Heart attack Maternal Grandmother    Heart disease Maternal Grandmother    Diabetes Maternal Grandfather    Hypertension Maternal Grandfather    Heart attack Maternal Grandfather    Heart disease Maternal Grandfather    Diabetes Sister    Hypertension Sister    Cancer Sister    Hyperlipidemia Sister    Heart attack Paternal Grandmother    Heart attack Paternal Grandfather    Cancer Sister    Hyperlipidemia Sister    COPD Maternal Aunt    Hypertension Maternal Aunt    Diabetes Son    Diabetes Maternal Aunt     Health Maintenance  Topic Date Due   Diabetic kidney evaluation - eGFR measurement   01/30/2023   COVID-19 Vaccine (5 - 2023-24 season) 04/20/2023 (Originally 01/30/2023)   INFLUENZA VACCINE  08/29/2023 (Originally 12/30/2022)   OPHTHALMOLOGY EXAM  06/12/2023   HEMOGLOBIN A1C  07/07/2023   FOOT EXAM  09/08/2023   Medicare Annual Wellness (AWV)  09/20/2023   Diabetic kidney evaluation - Urine ACR  12/21/2023   Colonoscopy  02/23/2026   DTaP/Tdap/Td (6 -  Td or Tdap) 10/27/2032   Pneumonia Vaccine 2+ Years old  Completed   Hepatitis C Screening  Completed   Zoster Vaccines- Shingrix  Completed   HPV VACCINES  Aged Out     ----------------------------------------------------------------------------------------------------------------------------------------------------------------------------------------------------------------- Physical Exam BP 120/74 (BP Location: Right Arm, Patient Position: Sitting, Cuff Size: Large)   Pulse 71   Ht 5' 9.5" (1.765 m)   Wt 260 lb 8 oz (118.2 kg)   SpO2 97%   BMI 37.92 kg/m   Physical Exam Constitutional:      Appearance: Normal appearance.  Skin:    Comments: Thumb with erythema and swelling along pad on R hand.    Neurological:     Mental Status: He is alert.     ------------------------------------------------------------------------------------------------------------------------------------------------------------------------------------------------------------------- Assessment and Plan  Splinter in skin Small sliver of organic material removed.  There still appears to be a very small piece that remains.  Recommend warm salt water soaks.  Adding cephaelxin as he does have erythema and swelling as well.    Meds ordered this encounter  Medications   cephALEXin (KEFLEX) 500 MG capsule    Sig: Take 1 capsule (500 mg total) by mouth 4 (four) times daily.    Dispense:  28 capsule    Refill:  0    No follow-ups on file.    This visit occurred during the SARS-CoV-2 public health emergency.  Safety protocols were in  place, including screening questions prior to the visit, additional usage of staff PPE, and extensive cleaning of exam room while observing appropriate contact time as indicated for disinfecting solutions.

## 2023-03-08 NOTE — Assessment & Plan Note (Signed)
Small sliver of organic material removed.  There still appears to be a very small piece that remains.  Recommend warm salt water soaks.  Adding cephaelxin as he does have erythema and swelling as well.

## 2023-03-22 ENCOUNTER — Ambulatory Visit: Payer: Medicare Other | Attending: Internal Medicine

## 2023-03-22 DIAGNOSIS — E785 Hyperlipidemia, unspecified: Secondary | ICD-10-CM | POA: Diagnosis not present

## 2023-03-23 LAB — LIPID PANEL
Chol/HDL Ratio: 2.8 ratio (ref 0.0–5.0)
Cholesterol, Total: 110 mg/dL (ref 100–199)
HDL: 39 mg/dL — ABNORMAL LOW (ref >39)
LDL Chol Calc (NIH): 57 mg/dL (ref 0–99)
Triglycerides: 68 mg/dL (ref 0–149)
VLDL Cholesterol Cal: 14 mg/dL (ref 5–40)

## 2023-03-24 ENCOUNTER — Telehealth: Payer: Self-pay | Admitting: Pharmacist

## 2023-03-24 NOTE — Telephone Encounter (Signed)
Lipid lab result discussed over the phone. Advised patient to continue Statin, Zetia and Repatha

## 2023-04-07 DIAGNOSIS — B351 Tinea unguium: Secondary | ICD-10-CM | POA: Diagnosis not present

## 2023-04-07 DIAGNOSIS — E1142 Type 2 diabetes mellitus with diabetic polyneuropathy: Secondary | ICD-10-CM | POA: Diagnosis not present

## 2023-04-07 DIAGNOSIS — L851 Acquired keratosis [keratoderma] palmaris et plantaris: Secondary | ICD-10-CM | POA: Diagnosis not present

## 2023-04-11 DIAGNOSIS — E1169 Type 2 diabetes mellitus with other specified complication: Secondary | ICD-10-CM | POA: Diagnosis not present

## 2023-04-11 DIAGNOSIS — Z794 Long term (current) use of insulin: Secondary | ICD-10-CM | POA: Diagnosis not present

## 2023-04-11 DIAGNOSIS — E669 Obesity, unspecified: Secondary | ICD-10-CM | POA: Diagnosis not present

## 2023-04-22 ENCOUNTER — Encounter: Payer: Self-pay | Admitting: Family Medicine

## 2023-04-22 ENCOUNTER — Other Ambulatory Visit: Payer: Self-pay | Admitting: Family Medicine

## 2023-04-22 DIAGNOSIS — E782 Mixed hyperlipidemia: Secondary | ICD-10-CM

## 2023-04-22 DIAGNOSIS — E785 Hyperlipidemia, unspecified: Secondary | ICD-10-CM

## 2023-04-22 DIAGNOSIS — I10 Essential (primary) hypertension: Secondary | ICD-10-CM

## 2023-04-22 DIAGNOSIS — N451 Epididymitis: Secondary | ICD-10-CM

## 2023-04-22 MED ORDER — LOSARTAN POTASSIUM 25 MG PO TABS: 25.0000 mg | ORAL_TABLET | Freq: Every day | ORAL | 3 refills | Status: DC

## 2023-04-22 MED ORDER — METFORMIN HCL ER 750 MG PO TB24: ORAL_TABLET | ORAL | 1 refills | Status: DC

## 2023-05-03 ENCOUNTER — Other Ambulatory Visit: Payer: Self-pay | Admitting: Family Medicine

## 2023-06-25 ENCOUNTER — Ambulatory Visit
Admission: EM | Admit: 2023-06-25 | Discharge: 2023-06-25 | Disposition: A | Discharge status: Home / Self Care | Payer: Medicare Other | Attending: Family Medicine | Admitting: Family Medicine

## 2023-06-25 DIAGNOSIS — R509 Fever, unspecified: Secondary | ICD-10-CM | POA: Diagnosis not present

## 2023-06-25 DIAGNOSIS — R059 Cough, unspecified: Secondary | ICD-10-CM | POA: Diagnosis not present

## 2023-06-25 DIAGNOSIS — B349 Viral infection, unspecified: Secondary | ICD-10-CM | POA: Diagnosis not present

## 2023-06-25 LAB — POC COVID19/FLU A&B COMBO
Covid Antigen, POC: NEGATIVE
Influenza A Antigen, POC: NEGATIVE
Influenza B Antigen, POC: NEGATIVE

## 2023-06-25 MED ORDER — PROMETHAZINE-DM 6.25-15 MG/5ML PO SYRP: 5.0000 mL | ORAL_SOLUTION | Freq: Two times a day (BID) | ORAL | 0 refills | Status: DC | PRN

## 2023-06-25 MED ORDER — BENZONATATE 200 MG PO CAPS: 200.0000 mg | ORAL_CAPSULE | Freq: Three times a day (TID) | ORAL | 0 refills | Status: AC | PRN

## 2023-06-25 NOTE — ED Triage Notes (Signed)
Pt presents to [facility] with co of loose stools, cough, congestion, dizziness, ha, body aches, fevers since Thursday. Tylenol otc.

## 2023-06-25 NOTE — Discharge Instructions (Addendum)
Advised patient may take Tessalon capsules daily or as needed for cough.  Advised may use Promethazine DM for cough at night prior to sleep due to sedative effects.  Advised patient may take OTC Tylenol 1 g every 6 hours for fever (oral temperature greater than 100.3).  Encouraged to increase daily water intake to 64 ounces per day while taking these medications.  Advised if symptoms worsen and/or unresolved please follow-up with PCP or here for further evaluation.

## 2023-06-25 NOTE — ED Provider Notes (Signed)
[facility]    CSN: [id number] Arrival date & time: 06/25/23  7829      History   Chief Complaint Chief Complaint  Patient presents with   Cough    HPI Gregory MCGLINN [name] is a 73 y.o. male.   HPI 73 year old male presents with cough congestion dizziness, headache body aches and fevers for 2 days.  PMH significant for morbid obesity, T2DM and HTN.  Patient is accompanied by his wife this morning.  Past Medical History:  Diagnosis Date   Allergy not sure   Arthritis    Chest pain    a. Normal cors 2001. b. Neg stress test 2012, 05/2014.   Diabetes mellitus without complication (HCC)    GERD (gastroesophageal reflux disease)    Hyperlipidemia    Hypertension    Hypertensive response to exercise    Hypothyroidism    Obesity    Obstructive sleep apnea    Currently untreated    PONV (postoperative nausea and vomiting)    Sleep apnea    Ulcer of left lower extremity (HCC) 12/16/2022    Patient Active Problem List   Diagnosis Date Noted   Splinter in skin 03/08/2023   COVID 02/02/2023   Bee sting reaction 01/11/2023   Ulcer of left lower extremity (HCC) 12/16/2022   Bilateral hand numbness 10/06/2022   Atypical pigmented lesion 09/29/2022   ED (erectile dysfunction) 09/29/2022   Fusion of spine of cervical region 09/14/2022   Bruising 09/08/2022   Acute tear of posterior aspect of lateral meniscus of left knee 01/29/2022   Bilateral third flexor stenosing tenosynovitis and first carpometacarpal osteoarthritis. 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   Hypertension associated with  diabetes (HCC) 07/13/2017   Benign essential tremor 07/13/2017   Kidney cyst, acquired 01/24/2017   Seborrheic keratoses 04/01/2016   Diverticulosis of colon without hemorrhage 02/26/2016   Anxiety state 10/02/2015   BPH (benign prostatic hyperplasia) 07/31/2015   Carpal tunnel syndrome, left upper limb 06/04/2015   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   Vitamin D deficiency 03/06/2015   History of pancreatitis 03/06/2015   Lumbar spondylosis 03/04/2015   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   Obstructive sleep apnea 05/15/2014   Hyperlipidemia 05/15/2014   Glaucoma suspect 10/21/2011    Past Surgical History:  Procedure Laterality Date   CARDIAC CATHETERIZATION     CERVICAL FUSION     HERNIA REPAIR     KNEE ARTHROSCOPY  1984   both knee   [facility]   SHOULDER ARTHROSCOPY WITH SUBACROMIAL DECOMPRESSION Right 09/13/2019   Procedure: RIGHT SHOULDER ARTHROSCOPY WITH EXTENSIVE DEBRIDEMENT, SUBACROMIAL DECOMPRESSION,;  Surgeon: Kathryne Hitch, MD;  [facility];  Service: Orthopedics;  Laterality: Right;   SPINE SURGERY  1st Sept 1977   2nd Feb 2011       Home Medications    Prior to Admission medications   Medication Sig Start Date End Date Taking? Authorizing Provider  benzonatate (TESSALON) 200 MG capsule Take 1 capsule (200 mg total) by mouth 3 (three) times daily as needed for up to 7 days. 06/25/23 07/02/23 Yes Trevor Iha, FNP  promethazine-dextromethorphan (PROMETHAZINE-DM) 6.25-15 MG/5ML syrup Take 5 mLs by mouth 2 (two) times daily as needed for cough. 06/25/23  Yes Trevor Iha, FNP  acetaminophen (TYLENOL) 650 MG CR tablet Take 1 tablet (650 mg total) by mouth every 8 (eight) hours as needed for pain. 01/13/22   Monica Becton, MD  aspirin 81 MG tablet Take 81 mg by mouth daily.    [provider]  carboxymethylcellulose (REFRESH PLUS) 0.5 % SOLN   07/16/20   [provider]  celecoxib (CELEBREX) 200 MG capsule One to 2 tablets by mouth daily as needed for pain. 10/30/21   Monica Becton, MD  cephALEXin (KEFLEX) 500 MG capsule Take 1 capsule (500 mg total) by mouth 4 (four) times daily. 03/08/23   [name], DO  Cholecalciferol (VITAMIN D3) 5000 units CAPS Take 1 capsule by mouth daily.    [provider]  clotrimazole-betamethasone (LOTRISONE) cream Apply 1 application. topically 2 (two) times daily. X 10 days 09/15/21   Agapito Games, MD  Continuous Glucose Sensor MISC by Does not apply route. dexacom    [provider]  cyclobenzaprine (FLEXERIL) 10 MG tablet  10/29/20   [provider]  diphenhydrAMINE-zinc acetate (BENADRYL) cream Apply topically 3 (three) times daily as needed for itching. 01/11/23   Charlton Amor, DO  Dulaglutide 3 MG/0.5ML SOPN Inject 3 mg into the skin once a week. 05/03/21   [provider]  empagliflozin (JARDIANCE) 25 MG TABS tablet Take 1 tablet (25 mg total) by mouth daily. 09/03/22   Meriam Sprague, MD  Evolocumab (REPATHA SURECLICK) 140 MG/ML SOAJ Inject 140 mg into the skin every 14 (fourteen) days. 01/26/23   [place], Peter M, MD  ezetimibe (ZETIA) 10 MG tablet Take 1 tablet (10 mg total) by mouth daily. 09/03/22   Meriam Sprague, MD  insulin glargine-yfgn (SEMGLEE) 100 UNIT/ML injection 30 Units daily. 10/29/20   [provider]  latanoprost (XALATAN) 0.005 % ophthalmic solution  07/16/20   [provider]  levothyroxine (SYNTHROID) 25 MCG tablet Take 25 mcg by mouth daily before breakfast.    [provider]  losartan (COZAAR) 25 MG tablet Take 1 tablet (25 mg total) by mouth daily. 04/22/23   [name], DO  metFORMIN (GLUCOPHAGE-XR) 750 MG 24 hr tablet TAKE 1 TABLET TWICE A DAY BEFORE MEALS 04/22/23   [name], DO  Omega-3 Fatty Acids (FISH OIL) 1000 MG CAPS Take by mouth. Takes 3 capsules a day    [provider]  omeprazole (PRILOSEC) 40 MG capsule TAKE 1 CAPSULE (40 MG TOTAL) BY MOUTH DAILY. 10/26/22   [name], DO  [facility] VERIO test strip 1 each by Other route as needed. 07/30/14   [provider]  propranolol ER (INDERAL LA) 60 MG 24 hr capsule TAKE 1 CAPSULE BY MOUTH EVERY DAY 04/22/23   [name], DO  rosuvastatin (CRESTOR) 5 MG tablet Take 1 tablet (5 mg total) by mouth daily. 09/03/22   Meriam Sprague, MD  tamsulosin (FLOMAX) 0.4 MG CAPS capsule TAKE 1 CAPSULE BY MOUTH EVERY DAY 05/03/23   [name], DO  traZODone (DESYREL) 50 MG tablet TAKE ONE-HALF (1/2) TO ONE TABLET AT BEDTIME AS NEEDED FOR SLEEP 09/08/22   [name], DO  vitamin B-12 (CYANOCOBALAMIN) 500 MCG tablet  10/29/20   [provider]    Family History Family History  Problem Relation Age of Onset   Diabetes Mother    Heart disease Mother  Hypertension Mother    Stroke Mother    Heart attack Mother    COPD Mother    Cancer Father    Heart disease Father    Diabetes Father    Hyperlipidemia Sister    Hypertension Sister    Diabetes Maternal Grandmother    Stroke Maternal Grandmother    Heart attack Maternal Grandmother    Heart disease Maternal Grandmother    Diabetes Maternal Grandfather    Hypertension Maternal Grandfather    Heart attack Maternal Grandfather    Heart disease Maternal Grandfather    Diabetes Sister    Hypertension Sister    Cancer Sister    Hyperlipidemia Sister    Heart attack Paternal Grandmother    Heart attack Paternal Grandfather    Cancer Sister    Hyperlipidemia Sister    COPD Maternal Aunt    Hypertension Maternal Aunt    Diabetes Son    Diabetes Maternal Aunt     Social History Social History   Tobacco Use   Smoking status: Former    Current packs/day: 0.00    Average packs/day: 1.5 packs/day for 30.0 years (45.0 ttl pk-yrs)    Types: Cigarettes    Start date: 06/01/1963    Quit date: 05/31/1993    Years since quitting:  30.0   Smokeless tobacco: Never   Tobacco comments:    24 years smoke free and alcohol free  Vaping Use   Vaping status: Never Used  Substance Use Topics   Alcohol use: No   Drug use: No     Allergies   Statins, Atorvastatin, Rosuvastatin, Liraglutide, and Lisinopril   Review of Systems Review of Systems  Constitutional:  Positive for chills, fatigue and fever.  Respiratory:  Positive for cough.   All other systems reviewed and are negative.    Physical Exam Triage Vital Signs ED Triage Vitals  Encounter Vitals Group     BP 06/25/23 1012 129/81     Systolic BP Percentile --      Diastolic BP Percentile --      Pulse Rate 06/25/23 1012 90     Resp 06/25/23 1012 16     Temp 06/25/23 1012 97.9 F (36.6 C)     Temp src --      SpO2 06/25/23 1012 98 %     Weight --      Height --      Head Circumference --      Peak Flow --      Pain Score 06/25/23 1011 9     Pain Loc --      Pain Education --      Exclude from Growth Chart --    No data found.  Updated Vital Signs BP 129/81   Pulse 90   Temp 97.9 F (36.6 C)   Resp 16   SpO2 98%     Physical Exam Vitals and nursing note reviewed.  Constitutional:      General: He is not in acute distress.    Appearance: Normal appearance. He is obese. He is not ill-appearing.  HENT:     Head: Normocephalic and atraumatic.     Right Ear: Tympanic membrane, ear canal and external ear normal.     Left Ear: Tympanic membrane, ear canal and external ear normal.     Mouth/Throat:     Mouth: Mucous membranes are moist.     Pharynx: Oropharynx is clear.  Eyes:     Extraocular Movements: Extraocular movements  intact.     Conjunctiva/sclera: Conjunctivae normal.     Pupils: Pupils are equal, round, and reactive to light.  Cardiovascular:     Rate and Rhythm: Normal rate and regular rhythm.     Pulses: Normal pulses.     Heart sounds: Normal heart sounds.  Pulmonary:     Effort: Pulmonary effort is normal.     Breath  sounds: Normal breath sounds. No wheezing, rhonchi or rales.     Comments: Infrequent nonproductive cough on exam Musculoskeletal:        General: Normal range of motion.     Cervical back: Normal range of motion and neck supple.  Skin:    General: Skin is warm and dry.  Neurological:     General: No focal deficit present.     Mental Status: He is alert and oriented to person, place, and time.  Psychiatric:        Mood and Affect: Mood normal.        Behavior: Behavior normal.      UC Treatments / Results  Labs (all labs ordered are listed, but only abnormal results are displayed) Labs Reviewed  POC COVID19/FLU A&B COMBO    EKG   Radiology No results found.  Procedures Procedures (including critical care time)  Medications Ordered in UC Medications - No data to display  Initial Impression / Assessment and Plan / UC Course  I have reviewed the triage vital signs and the nursing notes.  Pertinent labs & imaging results that were available during my care of the patient were reviewed by me and considered in my medical decision making (see chart for details).     DM: 1.  Viral illness-influenza A/B/COVID-19 negative; 2.  Cough, unspecified type; 2.  Cough, unspecified type-Rx'd Tessalon 200 mg capsules: Take 1 capsule 3 times daily, as needed for cough, Rx'd promethazine DM 6.25-15 Mg/5 mL syrup: Take 5 mL every 6 hours, as needed for cough; 3.  Fever, unspecified type. Advised patient may take Tessalon capsules daily or as needed for cough.  Advised may use Promethazine DM for cough at night prior to sleep due to sedative effects.  Advised patient may take OTC Tylenol 1 g every 6 hours for fever (oral temperature greater than 100.3).  Encouraged to increase daily water intake to 64 ounces per day while taking these medications.  Advised if symptoms worsen and/or unresolved please follow-up with PCP or here for further evaluation.  Final Clinical Impressions(s) / UC Diagnoses    Final diagnoses:  Cough, unspecified type  Viral illness  Fever, unspecified     Discharge Instructions      Advised patient may take Tessalon capsules daily or as needed for cough.  Advised may use Promethazine DM for cough at night prior to sleep due to sedative effects.  Advised patient may take OTC Tylenol 1 g every 6 hours for fever (oral temperature greater than 100.3).  Encouraged to increase daily water intake to 64 ounces per day while taking these medications.  Advised if symptoms worsen and/or unresolved please follow-up with PCP or here for further evaluation.     ED Prescriptions     Medication Sig Dispense Auth. Provider   benzonatate (TESSALON) 200 MG capsule Take 1 capsule (200 mg total) by mouth 3 (three) times daily as needed for up to 7 days. 40 capsule Trevor Iha, FNP   promethazine-dextromethorphan (PROMETHAZINE-DM) 6.25-15 MG/5ML syrup Take 5 mLs by mouth 2 (two) times daily as needed for cough.  118 mL Trevor Iha, FNP      PDMP not reviewed this encounter.   Trevor Iha, FNP 06/25/23 1100

## 2023-06-27 ENCOUNTER — Ambulatory Visit: Payer: Self-pay | Admitting: Family Medicine

## 2023-06-27 NOTE — Telephone Encounter (Signed)
  Chief Complaint: burning in chest Symptoms: nasal congestion, moderate SOB, moderate chest pain Frequency: x 1 day Pertinent Negatives: Patient denies fever, pain radiating.  Disposition: [x] ED /[] Urgent Care (no appt availability in office) / [] Appointment(In office/virtual)/ []  [facility]/ [] Home Care/ [x] Refused Recommended Disposition /[] [facility]/ []  Follow-up with PCP Additional Notes: Patient refused ED and states the SOB and chest pain are from the congestion and cough. He would like a call back and to be seen in the office. Patient was seen at urgent care this weekend and states his only new symptom is the constant chest pain. Called [facility] and staff aware. Copied from CRM [id number]. Topic: Clinical - Red Word Triage >> Jun 27, 2023  8:07 AM Dennison Nancy wrote: Red Word that prompted transfer to Nurse Triage: redword : Burning in chest   went to urgent care on saturday for diarreah and dizzy, couginging and congestion , no longer dizzy or have Diarreah still coughing and nasal congestion  new symptoms have burning in chest Reason for Disposition  Difficulty breathing  Answer Assessment - Initial Assessment Questions 1. LOCATION: "Where does it hurt?"       Top of chest.  2. RADIATION: "Does the pain go anywhere else?" (e.g., into neck, jaw, arms, back)     Denies.  3. ONSET: "When did the chest pain begin?" (Minutes, hours or days)      Yesterday.  4. PATTERN: "Does the pain come and go, or has it been constant since it started?"  "Does it get worse with exertion?"      Constant since 0200 this morning; coming and going yesterday.  5. DURATION: "How long does it last" (e.g., seconds, minutes, hours)     Constant today.  6. SEVERITY: "How bad is the pain?"  (e.g., Scale 1-10; mild, moderate, or severe)    - MILD (1-3): doesn't interfere with normal activities     - MODERATE (4-7): interferes with normal activities or awakens from sleep    - SEVERE  (8-10): excruciating pain, unable to do any normal activities       7/10  7. CARDIAC RISK FACTORS: "Do you have any history of heart problems or risk factors for heart disease?" (e.g., angina, prior heart attack; diabetes, high blood pressure, high cholesterol, smoker, or strong family history of heart disease)     Type 2 diabetes, denies cardiac history.  8. PULMONARY RISK FACTORS: "Do you have any history of lung disease?"  (e.g., blood clots in lung, asthma, emphysema, birth control pills)     Denies.  9. CAUSE: "What do you think is causing the chest pain?"     Most the time its when I'm coughing, I'd say it has to do with the cough.  10. OTHER SYMPTOMS: "Do you have any other symptoms?" (e.g., dizziness, nausea, vomiting, sweating, fever, difficulty breathing, cough)       Cough, nasal congestion  Protocols used: Chest Pain-A-AH

## 2023-06-28 ENCOUNTER — Encounter: Payer: Self-pay | Admitting: Family Medicine

## 2023-06-28 ENCOUNTER — Ambulatory Visit (INDEPENDENT_AMBULATORY_CARE_PROVIDER_SITE_OTHER): Payer: Medicare Other | Admitting: Family Medicine

## 2023-06-28 VITALS — BP 137/70 | HR 81 | Ht 69.5 in | Wt 256.8 lb

## 2023-06-28 DIAGNOSIS — J069 Acute upper respiratory infection, unspecified: Secondary | ICD-10-CM | POA: Diagnosis not present

## 2023-06-28 MED ORDER — GUAIFENESIN-CODEINE 100-10 MG/5ML PO SOLN: 5.0000 mL | Freq: Three times a day (TID) | ORAL | 0 refills | Status: DC | PRN

## 2023-06-28 MED ORDER — METHYLPREDNISOLONE 4 MG PO TBPK: ORAL_TABLET | ORAL | 0 refills | Status: DC

## 2023-06-28 NOTE — Patient Instructions (Signed)
Increase fluids    Increase vit c, d, and zinc  REST!

## 2023-06-28 NOTE — Assessment & Plan Note (Signed)
Very pleasant 73 yo male presents with 6 days of cough and congestion. Negative flu/covid done in urgent care. Says he is starting to feel better. Denies any sinus pressure/pain or tooth pain. At this point I do believe this is a viral infection and recommend we treat supportively. Pt felt like he was wheezing yesterday so we will give medrol dose pack. Lung exam is clear today so I don't feel a need for a cxr unless symptoms start to get worse over the next few days. Will go ahead and give cough medicine  - recommended supportive care and discussed RTC precautions

## 2023-06-28 NOTE — Progress Notes (Signed)
Acute Office Visit  Subjective:     Patient ID: Gregory Ready [name], male    DOB: 1951/01/05, 73 y.o.   MRN: [id number]  Chief Complaint  Patient presents with   Cough    Congestion, stuffy nose since last thrus.    HPI Patient is in today for acute sick visit. Admits to cough and congestion for 6 days. He did go to urgent care last week and tested negative for flu and covid. He was given tessalon perles and promethazine DM for cough medicine.   Review of Systems  Constitutional:  Negative for chills and fever.  HENT:  Positive for congestion.   Respiratory:  Positive for cough. Negative for shortness of breath.   Cardiovascular:  Negative for chest pain.  Neurological:  Negative for headaches.        Objective:    BP 137/70 (BP Location: Left Arm, Patient Position: Sitting)   Pulse 81   Ht 5' 9.5" (1.765 m)   Wt 256 lb 12 oz (116.5 kg)   SpO2 98%   BMI 37.37 kg/m    Physical Exam Vitals and nursing note reviewed.  Constitutional:      General: He is not in acute distress.    Appearance: Normal appearance.  HENT:     Head: Normocephalic and atraumatic.     Right Ear: Tympanic membrane, ear canal and external ear normal.     Left Ear: Tympanic membrane, ear canal and external ear normal.     Nose: Nose normal. No congestion.     Mouth/Throat:     Pharynx: Posterior oropharyngeal erythema present. No oropharyngeal exudate.  Eyes:     Conjunctiva/sclera: Conjunctivae normal.  Cardiovascular:     Rate and Rhythm: Normal rate and regular rhythm.  Pulmonary:     Effort: Pulmonary effort is normal.     Breath sounds: Normal breath sounds.  Neurological:     General: No focal deficit present.     Mental Status: He is alert and oriented to person, place, and time.  Psychiatric:        Mood and Affect: Mood normal.        Behavior: Behavior normal.        Thought Content: Thought content normal.        Judgment: Judgment normal.     No results found for  any visits on 06/28/23.      Assessment & Plan:   Problem List Items Addressed This Visit       Respiratory   Viral URI - Primary   Very pleasant 73 yo male presents with 6 days of cough and congestion. Negative flu/covid done in urgent care. Says he is starting to feel better. Denies any sinus pressure/pain or tooth pain. At this point I do believe this is a viral infection and recommend we treat supportively. Pt felt like he was wheezing yesterday so we will give medrol dose pack. Lung exam is clear today so I don't feel a need for a cxr unless symptoms start to get worse over the next few days. Will go ahead and give cough medicine  - recommended supportive care and discussed RTC precautions       Meds ordered this encounter  Medications   methylPREDNISolone (MEDROL DOSEPAK) 4 MG TBPK tablet    Sig: Follow instructions on pill pack    Dispense:  21 tablet    Refill:  0   guaiFENesin-codeine 100-10 MG/5ML syrup    Sig: Take 5  mLs by mouth 3 (three) times daily as needed for cough.    Dispense:  240 mL    Refill:  0    Return if symptoms worsen or fail to improve.  Charlton Amor, DO

## 2023-07-11 ENCOUNTER — Ambulatory Visit: Payer: Medicare Other | Admitting: Dermatology

## 2023-07-11 ENCOUNTER — Encounter: Payer: Self-pay | Admitting: Dermatology

## 2023-07-11 VITALS — BP 147/90

## 2023-07-11 DIAGNOSIS — L308 Other specified dermatitis: Secondary | ICD-10-CM

## 2023-07-11 DIAGNOSIS — L280 Lichen simplex chronicus: Secondary | ICD-10-CM

## 2023-07-11 DIAGNOSIS — L821 Other seborrheic keratosis: Secondary | ICD-10-CM

## 2023-07-11 DIAGNOSIS — I872 Venous insufficiency (chronic) (peripheral): Secondary | ICD-10-CM | POA: Diagnosis not present

## 2023-07-11 DIAGNOSIS — R21 Rash and other nonspecific skin eruption: Secondary | ICD-10-CM

## 2023-07-11 MED ORDER — TACROLIMUS 0.1 % EX OINT: TOPICAL_OINTMENT | Freq: Every day | CUTANEOUS | 2 refills | Status: AC

## 2023-07-11 NOTE — Progress Notes (Signed)
New Patient Visit   Subjective  Gregory JUMP [name] is a 73 y.o. male who presents for the following: New Pt - Skin lesion  Patient states he  has skin lesion located at the trunk & legs that he  would like to have examined. Patient reports the areas have been there for 1 year. He reports the areas are at times bothersome.Patient rates irritation (itchy) 5 out of 10. He states that the areas have spread. Patient reports he  has not previously been treated for these areas. Patient denies Hx of bx. Patient denies family history of skin cancer(s). Pt stated that he has DM and taking lipopress machine treatments at the [place] to keeping swelling off the legs.    The following portions of the chart were reviewed this encounter and updated as appropriate: medications, allergies, medical history  Review of Systems:  No other skin or systemic complaints except as noted in HPI or Assessment and Plan.  Objective  Well appearing patient in no apparent distress; mood and affect are within normal limits.   A focused examination was performed of the following areas: trunk & legs   Relevant exam findings are noted in the Assessment and Plan.                Assessment & Plan    Axillary Hyperpigmented Lesions Assessment: Patient presents with hyperpigmented lesions in both axillae, more prominent on one side. Differential diagnosis includes acanthosis nigricans, considered due to the patient's history of diabetes (A1c 6.3%). However, the appearance and location are atypical for acanthosis nigricans, leading to a primary concern for granulomatous slack skin, a rare condition. A biopsy is necessary for a definitive diagnosis.  Plan: Perform a 4mm punch biopsy of the right axillary lesion. Follow up in 2 weeks for suture removal and biopsy results review. Withhold prescription pending biopsy results.  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 These lesions are benign, genetically determined growths with no malignant potential. The patient reports associated pruritus.  Plan: Recommend CeraVe Anti-Itch body wash and lotion (containing Pramoxine) for pruritus management. Educate the patient on the product location in stores (first aid section).  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 Additionally, the patient has a history of leg swelling and uses a Lymphopress device, suggesting underlying venous insufficiency. This combination has led to stasis dermatitis.  Plan: Alternate treatment regimen: Apply Triamcinolone twice daily for 2 weeks, followed by Tacrolimus applied daily for 2 weeks. Continue alternating for 3 months. Document the current leg condition with photographs. Follow up in 3 months to assess treatment efficacy.  STASIS DERMATITIS   Related Medications tacrolimus (PROTOPIC) 0.1 % ointment Apply topically daily. Alternate with Triamcinolone every 2 weeks. RASH Right Axilla Skin / nail biopsy - Right Axilla Type of biopsy: punch   Informed consent: discussed and consent obtained   Timeout: patient name, date of birth, surgical site, and procedure verified   Procedure prep:  Patient was prepped and draped in usual sterile fashion Prep type:  Isopropyl alcohol Anesthesia: the lesion was anesthetized in a standard fashion   Anesthetic:  1% lidocaine w/ epinephrine 1-100,000 buffered w/ 8.4% NaHCO3 Punch size:  4 mm Suture size:  4-0 Suture type: Prolene (polypropylene)   Hemostasis achieved with: suture and aluminum chloride   Outcome: patient tolerated procedure well  Post-procedure details: sterile dressing applied and wound care instructions given   Dressing type: petrolatum gauze   Specimen 1 - Surgical pathology Differential Diagnosis: r/o  granulomatous slack skin   Check Margins: yes  No follow-ups on file.    Documentation: I have reviewed the above documentation for accuracy and completeness, and I agree with the above.   I, Shirron Marcha Solders, CMA, am acting as scribe for [employer], DO.   Langston Reusing, DO

## 2023-07-11 NOTE — Patient Instructions (Addendum)
 Hello [name],  Thank you for visiting our clinic today. We are committed to supporting you in managing your skin conditions and appreciate your dedication to improving your health.  Here is a summary of the key instructions and next steps from today's consultation:  Biopsy Procedure: A 4-millimeter punch biopsy was performed on the affected area under your arm to investigate the skin changes. The area was numbed, and a core sample was taken, then closed with sutures.   Follow-Up: Please return in two weeks for a discussion on the biopsy results and suture removal. A follow-up appointment is also scheduled in three months to monitor your legs.  Medications for Itch Relief:   CeraVe Anti-Itch Lotion: Apply as needed for itch relief. Available in the first aid section of local stores.   Tacrolimus  Cream: Continue applying this cream daily for venous stasis changes in your legs as previously prescribed.   Triamcinolone  Cream: Apply twice a day for two weeks on your legs, then alternate with Tacrolimus  for two weeks. Repeat this cycle for the next three months.   Please ensure to follow the care instructions provided today. If you have any questions or concerns before your next visit, do not hesitate to contact our office.  Warm regards,  Dr. Louana Roup [facility]   Patient Handout: Wound Care for Skin Biopsy Site  Taking Care of Your Skin Biopsy Site  Proper care of the biopsy site is essential for promoting healing and minimizing scarring. This handout provides instructions on how to care for your biopsy site to ensure optimal recovery.  1. Cleaning the Wound:  Clean the biopsy site daily with gentle soap and water. Gently pat the area dry with a clean, soft towel. Avoid harsh scrubbing or rubbing the area, as this can irritate the skin and delay healing.  2. Applying Aquaphor and Bandage:  After cleaning the wound, apply a thin layer of Aquaphor ointment to the biopsy  site. Cover the area with a sterile bandage to protect it from dirt, bacteria, and friction. Change the bandage daily or as needed if it becomes soiled or wet.  3. Continued Care for One Week:  Repeat the cleaning, Aquaphor application, and bandaging process daily for one week following the biopsy procedure. Keeping the wound clean and moist during this initial healing period will help prevent infection and promote optimal healing.  4. Massaging Aquaphor into the Area:  ---After one week, discontinue the use of bandages but continue to apply Aquaphor to the biopsy site. ----Gently massage the Aquaphor into the area using circular motions. ---Massaging the skin helps to promote circulation and prevent the formation of scar tissue.   Additional Tips:  Avoid exposing the biopsy site to direct sunlight during the healing process, as this can cause hyperpigmentation or worsen scarring. If you experience any signs of infection, such as increased redness, swelling, warmth, or drainage from the wound, contact your healthcare provider immediately. Follow any additional instructions provided by your healthcare provider for caring for the biopsy site and managing any discomfort. Conclusion:  Taking proper care of your skin biopsy site is crucial for ensuring optimal healing and minimizing scarring. By following these instructions for cleaning, applying Aquaphor, and massaging the area, you can promote a smooth and successful recovery. If you have any questions or concerns about caring for your biopsy site, don't hesitate to contact your healthcare provider for guidance.     Important Information  Due to recent changes in healthcare laws, you may see  results of your pathology and/or laboratory studies on MyChart before the doctors have had a chance to review them. We understand that in some cases there may be results that are confusing or concerning to you. Please understand that not all results are  received at the same time and often the doctors may need to interpret multiple results in order to provide you with the best plan of care or course of treatment. Therefore, we ask that you please give us  2 business days to thoroughly review all your results before contacting the office for clarification. Should we see a critical lab result, you will be contacted sooner.   If You Need Anything After Your Visit  If you have any questions or concerns for your doctor, please call our main line at [phone] If no one answers, please leave a voicemail as directed and we will return your call as soon as possible. Messages left after 4 pm will be answered the following business day.   You may also send us  a message via MyChart. We typically respond to MyChart messages within 1-2 business days.  For prescription refills, please ask your pharmacy to contact our office. Our fax number is [phone].  If you have an urgent issue when the clinic is closed that cannot wait until the next business day, you can page your doctor at the number below.    Please note that while we do our best to be available for urgent issues outside of office hours, we are not available 24/7.   If you have an urgent issue and are unable to reach us , you may choose to seek medical care at your doctor's office, retail clinic, urgent care center, or emergency room.  If you have a medical emergency, please immediately call 911 or go to the emergency department. In the event of inclement weather, please call our main line at [phone] for an update on the status of any delays or closures.  Dermatology Medication Tips: Please keep the boxes that topical medications come in in order to help keep track of the instructions about where and how to use these. Pharmacies typically print the medication instructions only on the boxes and not directly on the medication tubes.   If your medication is too expensive, please contact our  office at [phone] or send us  a message through MyChart.   We are unable to tell what your co-pay for medications will be in advance as this is different depending on your insurance coverage. However, we may be able to find a substitute medication at lower cost or fill out paperwork to get insurance to cover a needed medication.   If a prior authorization is required to get your medication covered by your insurance company, please allow us  1-2 business days to complete this process.  Drug prices often vary depending on where the prescription is filled and some pharmacies may offer cheaper prices.  The website www.goodrx.com contains coupons for medications through different pharmacies. The prices here do not account for what the cost may be with help from insurance (it may be cheaper with your insurance), but the website can give you the price if you did not use any insurance.  - You can print the associated coupon and take it with your prescription to the pharmacy.  - You may also stop by our office during regular business hours and pick up a GoodRx coupon card.  - If you need your prescription sent electronically to a different pharmacy, notify our  office through [facility] or by phone at [phone]

## 2023-07-13 LAB — SURGICAL PATHOLOGY

## 2023-07-25 ENCOUNTER — Ambulatory Visit (INDEPENDENT_AMBULATORY_CARE_PROVIDER_SITE_OTHER): Payer: Medicare Other | Admitting: Dermatology

## 2023-07-25 ENCOUNTER — Encounter: Payer: Self-pay | Admitting: Dermatology

## 2023-07-25 DIAGNOSIS — L309 Dermatitis, unspecified: Secondary | ICD-10-CM

## 2023-07-25 DIAGNOSIS — L300 Nummular dermatitis: Secondary | ICD-10-CM | POA: Diagnosis not present

## 2023-07-25 NOTE — Patient Instructions (Addendum)
 Dear Gregory West,  Thank you for visiting my office today.   Here is a summary of the key instructions and recommendations from today's consultation:  Medication: Start applying Tacrolimus cream to the affected area every day, twice a day, until the condition resolves. Should new spots emerge, apply Tacrolimus to those areas as well.  Shower Guidelines:   Temperature: Use only warm water for bathing and strictly avoid hot water.   Cleansers: Opt for gentle skin cleansers like Dove or [place], and steer clear of harsh soaps such as [place] or Product manager 2000.  Post-Shower Care:   Moisturizing: Immediately after showering and towel drying, apply a moisturizer. Recommended products are Excedrin Eczema Therapy or Excedrin Advanced Repair.  Follow-Up: Our next meeting is scheduled in a couple of weeks for your leg treatment. Subsequently, we will arrange yearly visits to monitor your condition. Once your legs are under control, we may consider extending the period between visits.  I have provided samples of the recommended moisturizers and have highlighted the important areas in the handouts given to you.  Should you have any questions or concerns before our next meeting, please do not hesitate to reach out to our office.  Best regards,  Dr. Langston Reusing [facility] Important Information  Due to recent changes in healthcare laws, you may see results of your pathology and/or laboratory studies on MyChart before the doctors have had a chance to review them. We understand that in some cases there may be results that are confusing or concerning to you. Please understand that not all results are received at the same time and often the doctors may need to interpret multiple results in order to provide you with the best plan of care or course of treatment. Therefore, we ask that you please give [place] 2 business days to thoroughly review all your results before contacting the office for clarification. Should  we see a critical lab result, you will be contacted sooner.   If You Need Anything After Your Visit  If you have any questions or concerns for your doctor, please call our main line at [phone] If no one answers, please leave a voicemail as directed and we will return your call as soon as possible. Messages left after 4 pm will be answered the following business day.   You may also send [place] a message via MyChart. We typically respond to MyChart messages within 1-2 business days.  For prescription refills, please ask your pharmacy to contact our office. Our fax number is [phone].  If you have an urgent issue when the clinic is closed that cannot wait until the next business day, you can page your doctor at the number below.    Please note that while we do our best to be available for urgent issues outside of office hours, we are not available 24/7.   If you have an urgent issue and are unable to reach [place], you may choose to seek medical care at your doctor's office, retail clinic, urgent care center, or emergency room.  If you have a medical emergency, please immediately call 911 or go to the emergency department. In the event of inclement weather, please call our main line at [phone] for an update on the status of any delays or closures.  Dermatology Medication Tips: Please keep the boxes that topical medications come in in order to help keep track of the instructions about where and how to use these. Pharmacies typically print the medication instructions only on the boxes  and not directly on the medication tubes.   If your medication is too expensive, please contact our office at [phone] or send [place] a message through MyChart.   We are unable to tell what your co-pay for medications will be in advance as this is different depending on your insurance coverage. However, we may be able to find a substitute medication at lower cost or fill out paperwork to get insurance to cover  a needed medication.   If a prior authorization is required to get your medication covered by your insurance company, please allow [place] 1-2 business days to complete this process.  Drug prices often vary depending on where the prescription is filled and some pharmacies may offer cheaper prices.  The website www.goodrx.com contains coupons for medications through different pharmacies. The prices here do not account for what the cost may be with help from insurance (it may be cheaper with your insurance), but the website can give you the price if you did not use any insurance.  - You can print the associated coupon and take it with your prescription to the pharmacy.  - You may also stop by our office during regular business hours and pick up a GoodRx coupon card.  - If you need your prescription sent electronically to a different pharmacy, notify our office through [facility] or by phone at [phone]

## 2023-07-25 NOTE — Progress Notes (Signed)
   Follow-Up Visit   Subjective  Gregory BLANKENBURG [name] is a 73 y.o. male who presents for the following: Suture Removal & Discuss Path results  Patient present today for follow up visit. Patient was last evaluated on 07/11/23. At this visit patient was prescribed Tacrolimus for venous stasis. Patient reports sxs are better. Patient denies medication changes.  The following portions of the chart were reviewed this encounter and updated as appropriate: medications, allergies, medical history  Review of Systems:  No other skin or systemic complaints except as noted in HPI or Assessment and Plan.  Objective  Well appearing patient in no apparent distress; mood and affect are within normal limits.   A focused examination was performed of the following areas: R Axilla   Relevant exam findings are noted in the Assessment and Plan.  Pathology Results Diagnosis Skin , right axilla PSORIASIFORM AND SPONGIOTIC DERMATITIS, SEE DESCRIPTION Microscopic Description There is regular acanthosis with spongiosis and parakeratosis. Infiltrates of lymphocytes and eosinophils are present within the dermis. The pattern is that of a chronic spongiotic process. These changes may be seen in a variety of settings, including contact dermatitis, nummular dermatitis and also atopic dermatitis. Seborrheic dermatitis is also a consideration histologically. No changes to suggest cutaneous lymphoid neoplasia are observed.   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 A biopsy was performed to exclude granulomatous slack skin, a type of cutaneous lymphoma. Pathology results were negative for lymphoma cells, confirming the diagnosis of nummular eczema. The patient reports the lesion is not particularly itchy, which was incidentally noticed in the mirror.  Plan:  Reviewed biopsy results  in detail   Apply tacrolimus cream to the affected area twice daily until resolved.   Use tacrolimus cream for any new spots that may appear.   Avoid hot water during showers; use warm water instead.   Use gentle cleansers like Dove or Olay, avoiding harsh soaps.   Apply moisturizer immediately after showering.   Provide samples of CeraVe Eczema Therapy or CeraVe Advanced Repair as an overall moisturizer.   No follow-ups on file.    Documentation: I have reviewed the above documentation for accuracy and completeness, and I agree with the above.   I, Shirron Marcha Solders, CMA, am acting as scribe for [employer], DO.   Langston Reusing, DO

## 2023-07-26 NOTE — Progress Notes (Signed)
 Results discussed with pt at [facility] visit.  Bx confirmed eczema.

## 2023-08-02 DIAGNOSIS — E1142 Type 2 diabetes mellitus with diabetic polyneuropathy: Secondary | ICD-10-CM | POA: Diagnosis not present

## 2023-08-02 DIAGNOSIS — B351 Tinea unguium: Secondary | ICD-10-CM | POA: Diagnosis not present

## 2023-08-02 DIAGNOSIS — L84 Corns and callosities: Secondary | ICD-10-CM | POA: Diagnosis not present

## 2023-08-29 NOTE — Progress Notes (Deleted)
  Cardiology Office Note:  .   Date:  08/29/2023  ID:  Gregory Ready [name], DOB 1950-10-18, MRN [id number] PCP: [name], DO  [facility] Providers Cardiologist:  Lesleigh Noe, MD (Inactive) { Click to update primary MD,subspecialty MD or APP then REFRESH:1}   Patient Profile: .      PMH Hypertension AAA Diabetes OSA on CPAP Hyperlipidemia Chest pain Statin intolerance  History significant for cardiac catheterization in 2001 with normal coronaries and normal LV function.  He reported left parasternal chest pain and was seen by Dr. Katrinka Blazing.  Coronary CTA 07/2019 with calcium score of 0 and no significant CAD. He was advised to see GI for evaluation of GERD.  Seen in clinic 09/03/2022 by Dr. Shari Prows.  He reported history of small AAA being monitored by the [place].  He reported occasional sharp chest pain nonexertional in nature.  He has chronic lymphedema and uses a Lymphapress machine which has helped significantly.  BP was well-controlled. Lipid panel was checked which revealed LDL-C of 144, triglycerides 98.  He admitted to noncompliance with rosuvastatin and ezetimibe.  Repeat lipid panel 12/07/22 with LDL down to 110.  He was referred to [facility] for lipid management.  Seen by Carmela Hurt, RPH on 01/06/2023.  He reported intolerance of rosuvastatin 20 mg, atorvastatin, and pravastatin due to muscle cramps.  He had been switched from Trulicity to Ozempic for diabetes and weight management.  He had lost 50 lbs since starting GLP-1.  Of note, he had pancreatitis with Victoza.  Family history significant for mother with strokes.  He was advised to work on lifestyle modification including better diet and increase physical activity along with adding Repatha.  Lipid panel completed 03/22/2023 revealed total cholesterol 110, triglycerides 68, HDL 39, LDL-C 57.       History of Present Illness: .   Gregory Governor Rooks [name] is a *** 73 y.o. male ***   Discussed the use of AI  scribe software for clinical note transcription with the patient, who gave verbal consent to proceed.   ROS: ***       Studies Reviewed: Marland Kitchen         No results found for: "LIPOA"   *** Risk Assessment/Calculations:   {Does this patient have ATRIAL FIBRILLATION?:[id number]} No BP recorded.  {Refresh Note OR Click here to enter BP  :1}***       Physical Exam:   VS:  There were no vitals taken for this visit.   Wt Readings from Last 3 Encounters:  06/28/23 256 lb 12 oz (116.5 kg)  03/08/23 260 lb 8 oz (118.2 kg)  02/02/23 260 lb 8 oz (118.2 kg)    GEN: Well nourished, well developed in no acute distress NECK: No JVD; No carotid bruits CARDIAC: ***RRR, no murmurs, rubs, gallops RESPIRATORY:  Clear to auscultation without rales, wheezing or rhonchi  ABDOMEN: Soft, non-tender, non-distended EXTREMITIES:  No edema; No deformity     ASSESSMENT AND PLAN: .    Hyperlipidemia LDL goal < 70: Hypertension: AAA:      {Are you ordering a CV Procedure (e.g. stress test, cath, DCCV, TEE, etc)?   Press F2        :[id number]}  Disposition:***  Signed, Eligha Bridegroom, NP-C

## 2023-09-01 ENCOUNTER — Ambulatory Visit: Payer: Medicare Other | Admitting: Nurse Practitioner

## 2023-09-02 IMAGING — DX DG HAND COMPLETE 3+V*R*
3 series · 3 of 3 positions shown · non-contrast
Comparison: None available

CLINICAL DATA: Third finger pain.

EXAM:
RIGHT HAND - COMPLETE 3+ VIEW

[hand pa]
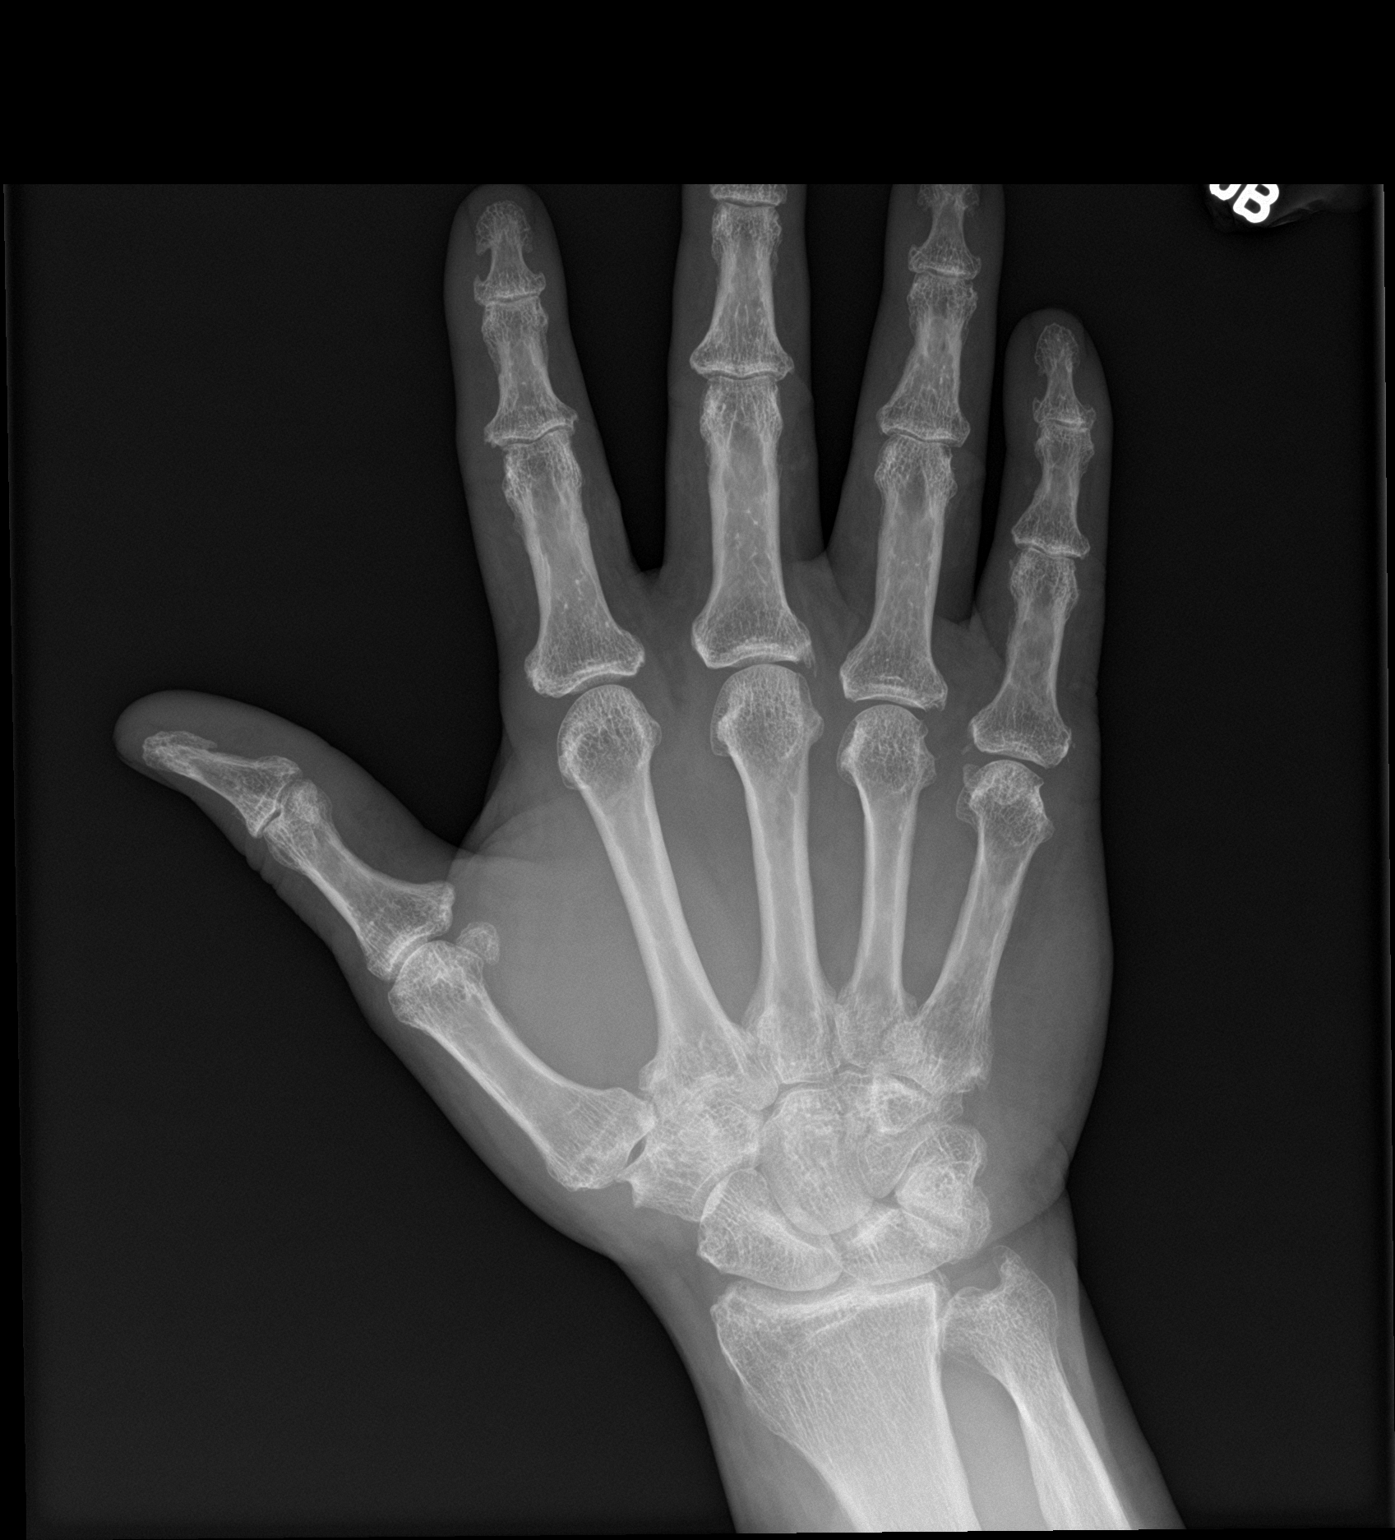

[hand obl]
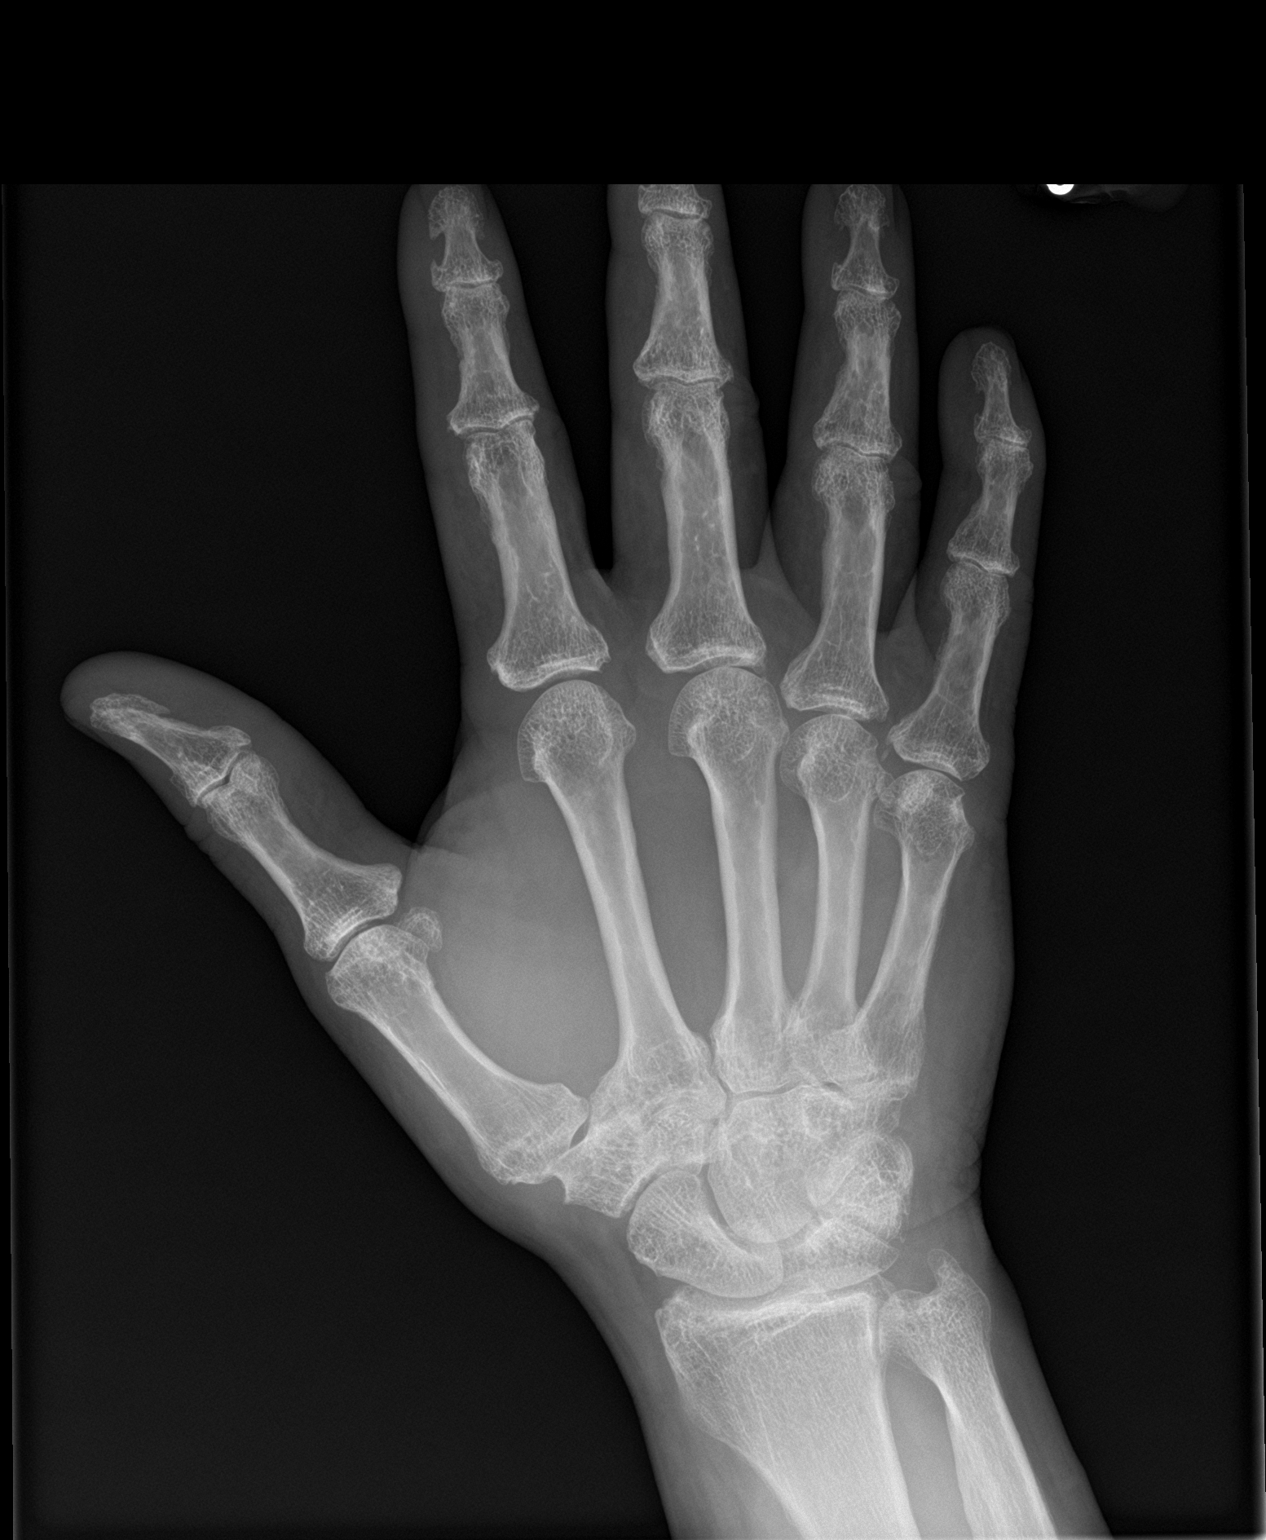

[hand lat]
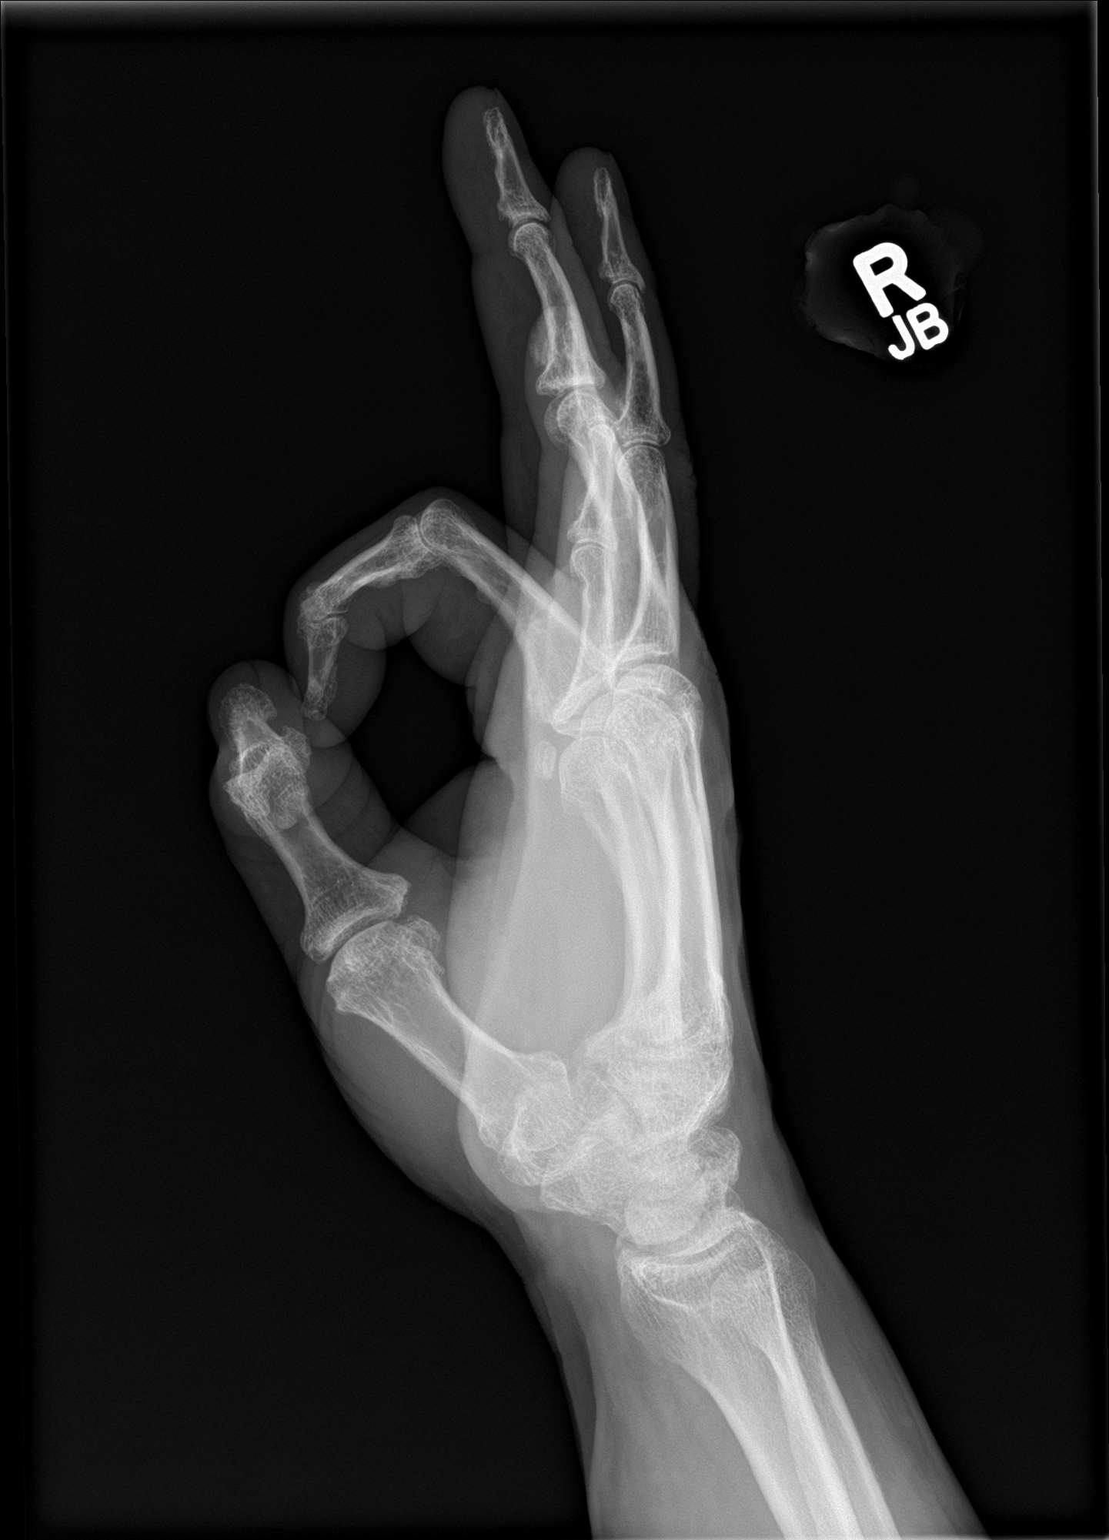

[3 of 3 positions shown; findings below may reference images not displayed]

FINDINGS: There is diffuse decreased bone mineralization. Moderate triscaphe;
moderate to severe thumb carpometacarpal; moderate thumb
interphalangeal; severe index finger DIP; moderate index finger PIP,
and third, and fourth finger DIP; and mild-to-moderate rest of the
interphalangeal joint space narrowing. Mild ulnar negative variance.
Mild osteoarthritis of the distal radioulnar joint. Mild distal
radiocarpal joint space narrowing.
IMPRESSION: Arthritis in a distribution suggesting osteoarthritis, greatest
within the thumb carpometacarpal and interphalangeal joints.

## 2023-09-08 DIAGNOSIS — G4733 Obstructive sleep apnea (adult) (pediatric): Secondary | ICD-10-CM | POA: Diagnosis not present

## 2023-09-21 ENCOUNTER — Ambulatory Visit: Payer: Medicare Other

## 2023-09-21 VITALS — Ht 69.5 in | Wt 255.0 lb

## 2023-09-21 DIAGNOSIS — Z0000 Encounter for general adult medical examination without abnormal findings: Secondary | ICD-10-CM | POA: Diagnosis not present

## 2023-09-21 NOTE — Progress Notes (Signed)
 Subjective:   Gregory Kinnier [name] is a 73 y.o. male who presents for Medicare Annual/Subsequent preventive examination.  Visit Complete: Virtual I connected with  Gregory Kinnier [name] on 09/21/23 by a audio enabled telemedicine application and verified that I am speaking with the correct person using two identifiers.  Patient Location: Home  Provider Location: Office/Clinic  I discussed the limitations of evaluation and management by telemedicine. The patient expressed understanding and agreed to proceed.  Vital Signs: Because this visit was a virtual/telehealth visit, some criteria may be missing or patient reported. Any vitals not documented were not able to be obtained and vitals that have been documented are patient reported.  Patient Medicare AWV questionnaire was completed by the patient on 09/16/2023; I have confirmed that all information answered by patient is correct and no changes since this date.  Cardiac Risk Factors include: advanced age (>43men, >68 women);male gender;obesity (BMI >30kg/m2);smoking/ tobacco exposure;hypertension;diabetes mellitus;dyslipidemia;family history of premature cardiovascular disease     Objective:    Today's Vitals   09/21/23 0753  Weight: 255 lb (115.7 kg)  Height: 5' 9.5" (1.765 m)   Body mass index is 37.12 kg/m.     09/21/2023    8:08 AM 09/20/2022    8:13 AM 09/16/2021    8:09 AM 11/03/2020    9:39 AM 09/15/2020    9:13 AM 09/06/2019   10:36 AM 03/22/2019    8:39 PM  Advanced Directives  Does Patient Have a Medical Advance Directive? No;Yes Yes Yes Yes No Yes Yes  Type of Estate agent of [place];Living will Living will Healthcare Power of [place];Living will Healthcare Power of [place];Living will   Living will  Does patient want to make changes to medical advance directive?  No - Patient declined No - Patient declined   No - Patient declined   Copy of Healthcare Power of Attorney in Chart? No - copy  requested  No - copy requested No - copy requested  No - copy requested   Would patient like information on creating a medical advance directive? No - Patient declined    No - Patient declined      Current Medications (verified) Outpatient Encounter Medications as of 09/21/2023  Medication Sig   acetaminophen  (TYLENOL ) 650 MG CR tablet Take 1 tablet (650 mg total) by mouth every 8 (eight) hours as needed for pain.   aspirin 81 MG tablet Take 81 mg by mouth daily.   carboxymethylcellulose (REFRESH PLUS) 0.5 % SOLN    celecoxib  (CELEBREX ) 200 MG capsule One to 2 tablets by mouth daily as needed for pain.   Cholecalciferol (VITAMIN D3) 5000 units CAPS Take 1 capsule by mouth daily.   clotrimazole -betamethasone  (LOTRISONE ) cream Apply 1 application. topically 2 (two) times daily. X 10 days   Continuous Glucose Sensor MISC by Does not apply route. dexacom   cyclobenzaprine (FLEXERIL) 10 MG tablet    diphenhydrAMINE -zinc  acetate (BENADRYL ) cream Apply topically 3 (three) times daily as needed for itching.   Dulaglutide 3 MG/0.5ML SOPN Inject 3 mg into the skin once a week.   empagliflozin  (JARDIANCE ) 25 MG TABS tablet Take 1 tablet (25 mg total) by mouth daily.   Evolocumab  (REPATHA  SURECLICK) 140 MG/ML SOAJ Inject 140 mg into the skin every 14 (fourteen) days.   ezetimibe  (ZETIA ) 10 MG tablet Take 1 tablet (10 mg total) by mouth daily.   guaiFENesin -codeine  100-10 MG/5ML syrup Take 5 mLs by mouth 3 (three) times daily as needed for cough.  insulin glargine-yfgn (SEMGLEE) 100 UNIT/ML injection 20 Units daily.   latanoprost (XALATAN) 0.005 % ophthalmic solution    levothyroxine (SYNTHROID) 25 MCG tablet Take 25 mcg by mouth daily before breakfast.   losartan  (COZAAR ) 25 MG tablet Take 1 tablet (25 mg total) by mouth daily.   metFORMIN  (GLUCOPHAGE -XR) 750 MG 24 hr tablet TAKE 1 TABLET TWICE A DAY BEFORE MEALS   Omega-3 Fatty Acids (FISH OIL) 1000 MG CAPS Take by mouth. Takes 3 capsules a day    omeprazole  (PRILOSEC) 40 MG capsule TAKE 1 CAPSULE (40 MG TOTAL) BY MOUTH DAILY.   propranolol  ER (INDERAL  LA) 60 MG 24 hr capsule TAKE 1 CAPSULE BY MOUTH EVERY DAY   rosuvastatin  (CRESTOR ) 5 MG tablet Take 1 tablet (5 mg total) by mouth daily.   Semaglutide, 2 MG/DOSE, (OZEMPIC, 2 MG/DOSE,) 8 MG/3ML SOPN Inject 2 mg into the skin once a week.   tacrolimus  (PROTOPIC ) 0.1 % ointment Apply topically daily. Alternate with Triamcinolone  every 2 weeks.   tamsulosin  (FLOMAX ) 0.4 MG CAPS capsule TAKE 1 CAPSULE BY MOUTH EVERY DAY   traZODone  (DESYREL ) 50 MG tablet TAKE ONE-HALF (1/2) TO ONE TABLET AT BEDTIME AS NEEDED FOR SLEEP   vitamin B-12 (CYANOCOBALAMIN ) 500 MCG tablet    [DISCONTINUED] cephALEXin  (KEFLEX ) 500 MG capsule Take 1 capsule (500 mg total) by mouth 4 (four) times daily.   [DISCONTINUED] methylPREDNISolone  (MEDROL  DOSEPAK) 4 MG TBPK tablet Follow instructions on pill pack   [DISCONTINUED] ONETOUCH VERIO test strip 1 each by Other route as needed. (Patient not taking: Reported on 09/21/2023)   No facility-administered encounter medications on file as of 09/21/2023.    Allergies (verified) Statins, Atorvastatin, Rosuvastatin , Liraglutide, and Lisinopril   History: Past Medical History:  Diagnosis Date   Allergy not sure   Arthritis    Chest pain    a. Normal cors 2001. b. Neg stress test 2012, 05/2014.   Diabetes mellitus without complication (HCC)    GERD (gastroesophageal reflux disease)    Hyperlipidemia    Hypertension    Hypertensive response to exercise    Hypothyroidism    Obesity    Obstructive sleep apnea    Currently untreated    PONV (postoperative nausea and vomiting)    Sleep apnea    Ulcer of left lower extremity (HCC) 12/16/2022   Past Surgical History:  Procedure Laterality Date   CARDIAC CATHETERIZATION     CERVICAL FUSION     HERNIA REPAIR     KNEE ARTHROSCOPY  1984   both knee   [facility]   SHOULDER ARTHROSCOPY WITH  SUBACROMIAL DECOMPRESSION Right 09/13/2019   Procedure: RIGHT SHOULDER ARTHROSCOPY WITH EXTENSIVE DEBRIDEMENT, SUBACROMIAL DECOMPRESSION,;  Surgeon: Arnie Lao, MD;  Location: [facility];  Service: Orthopedics;  Laterality: Right;   SPINE SURGERY  1st Sept 1977   2nd Feb 2011   Family History  Problem Relation Age of Onset   Diabetes Mother    Heart disease Mother    Hypertension Mother    Stroke Mother    Heart attack Mother    COPD Mother    Cancer Father    Heart disease Father    Diabetes Father    Hyperlipidemia Sister    Hypertension Sister    Diabetes Maternal Grandmother    Stroke Maternal Grandmother    Heart attack Maternal Grandmother    Heart disease Maternal Grandmother    Diabetes Maternal Grandfather    Hypertension Maternal Grandfather  Heart attack Maternal Grandfather    Heart disease Maternal Grandfather    Diabetes Sister    Hypertension Sister    Cancer Sister    Hyperlipidemia Sister    Heart attack Paternal Grandmother    Heart attack Paternal Grandfather    Cancer Sister    Hyperlipidemia Sister    COPD Maternal Aunt    Hypertension Maternal Aunt    Diabetes Son    Diabetes Maternal Aunt    Social History   Socioeconomic History   Marital status: Married    Spouse name: Devra Fontana   Number of children: 2   Years of education: 16   Highest education level: Bachelor's degree (e.g., BA, AB, BS)  Occupational History    Comment: Retired  Tobacco Use   Smoking status: Former    Current packs/day: 0.00    Average packs/day: 1.5 packs/day for 30.0 years (45.0 ttl pk-yrs)    Types: Cigarettes    Start date: 06/01/1963    Quit date: 05/31/1993    Years since quitting: 30.3   Smokeless tobacco: Never   Tobacco comments:    24 years smoke free and alcohol free  Vaping Use   Vaping status: Never Used  Substance and Sexual Activity   Alcohol use: No   Drug use: No   Sexual activity: Yes    Birth control/protection:  None    Comment: Not Necessary  Other Topics Concern   Not on file  Social History Narrative   Lives with his wife. He has two children and three grand children. He likes to read his bible and go fishing in his free time.    Social Drivers of Corporate investment banker Strain: Low Risk  (09/21/2023)   Overall Financial Resource Strain (CARDIA)    Difficulty of Paying Living Expenses: Not hard at all  Food Insecurity: No Food Insecurity (09/21/2023)   Hunger Vital Sign    Worried About Running Out of Food in the Last Year: Never true    Ran Out of Food in the Last Year: Never true  Recent Concern: Food Insecurity - Food Insecurity Present (06/27/2023)   Hunger Vital Sign    Worried About Running Out of Food in the Last Year: Never true    Ran Out of Food in the Last Year: Sometimes true  Transportation Needs: No Transportation Needs (09/21/2023)   PRAPARE - Administrator, Civil Service (Medical): No    Lack of Transportation (Non-Medical): No  Physical Activity: Insufficiently Active (09/21/2023)   Exercise Vital Sign    Days of Exercise per Week: 2 days    Minutes of Exercise per Session: 40 min  Stress: No Stress Concern Present (09/21/2023)   [employer] of Occupational Health - Occupational Stress Questionnaire    Feeling of Stress : Not at all  Social Connections: Socially Integrated (09/21/2023)   Social Connection and Isolation Panel [NHANES]    Frequency of Communication with Friends and Family: More than three times a week    Frequency of Social Gatherings with Friends and Family: More than three times a week    Attends Religious Services: 1 to 4 times per year    Active Member of [employer] or Organizations: Yes    Attends Engineer, structural: More than 4 times per year    Marital Status: Married    Tobacco Counseling Counseling given: Not Answered Tobacco comments: 24 years smoke free and alcohol free   Clinical Intake:  Pre-visit preparation  completed: Yes  Pain : No/denies pain     BMI - recorded: 37.12 Nutritional Status: BMI > 30  Obese Nutritional Risks: None Diabetes: Yes CBG done?: Yes (105 mg/dl) CBG resulted in Enter/ Edit results?: No Did pt. bring in CBG monitor from home?: No  How often do you need to have someone help you when you read instructions, pamphlets, or other written materials from your doctor or pharmacy?: 1 - Never What is the last grade level you completed in school?: 16  Interpreter Needed?: No      Activities of Daily Living    09/21/2023    7:56 AM 09/17/2023    5:32 PM  In your present state of health, do you have any difficulty performing the following activities:  Hearing? 0 0  Vision? 1 1  Difficulty concentrating or making decisions? 0 0  Walking or climbing stairs? 1 1  Dressing or bathing? 0 0  Doing errands, shopping? 0 0  Preparing Food and eating ? N N  Using the Toilet? N N  In the past six months, have you accidently leaked urine? Y Y  Do you have problems with loss of bowel control? N N  Managing your Medications? N N  Managing your Finances? N N  Housekeeping or managing your Housekeeping? N N    Patient Care Team: Adela Holter, DO as PCP - General (Family Medicine) Marce Sensing, DPM as Referring Physician (Podiatry) Baggett, Jolanda Nation, DO as Referring Physician (Internal Medicine)  Indicate any recent Medical Services you may have received from other than [facility] providers in the past year (date may be approximate).     Assessment:   This is a routine wellness examination for Cas.  Hearing/Vision screen No results found.   Goals Addressed             This Visit's Progress    Patient Stated       He would like to be around 225 lbs by the end of the year.       Depression Screen    09/21/2023    8:07 AM 01/11/2023    3:48 PM 09/20/2022    8:13 AM 09/08/2022    8:23 AM 09/16/2021    8:09 AM 09/15/2021   10:32 AM 07/29/2021    8:59 AM   PHQ 2/9 Scores  PHQ - 2 Score 0 0 0 0 0 0 0  PHQ- 9 Score  1         Fall Risk    09/21/2023    8:09 AM 09/17/2023    5:32 PM 01/11/2023    3:48 PM 09/20/2022    8:13 AM 09/16/2022    4:09 PM  Fall Risk   Falls in the past year?  0 0 0 0  Number falls in past yr: 0 0 0 0   Injury with Fall? 0 0 0 0   Risk for fall due to : No Fall Risks  No Fall Risks No Fall Risks   Follow up Falls evaluation completed  Falls evaluation completed Falls evaluation completed     MEDICARE RISK AT HOME: Medicare Risk at Home Any stairs in or around the home?: No If so, are there any without handrails?: No Home free of loose throw rugs in walkways, pet beds, electrical cords, etc?: Yes Adequate lighting in your home to reduce risk of falls?: Yes Life alert?: No Use of a cane, walker or w/c?: No Grab bars in the bathroom?: No Shower  chair or bench in shower?: Yes Elevated toilet seat or a handicapped toilet?: No  TIMED UP AND GO:  Was the test performed?  No    Cognitive Function:        09/21/2023    8:10 AM 09/20/2022    8:23 AM 09/16/2021    8:16 AM 09/15/2020    9:21 AM  6CIT Screen  What Year? 0 points 0 points 0 points 0 points  What month? 0 points 0 points 0 points 0 points  What time? 0 points 0 points 0 points 0 points  Count back from 20 0 points 0 points 0 points 0 points  Months in reverse 0 points 0 points 0 points 0 points  Repeat phrase 0 points 0 points 0 points 0 points  Total Score 0 points 0 points 0 points 0 points    Immunizations Immunization History  Administered Date(s) Administered   Fluad Quad(high Dose 65+) 01/31/2019, 02/01/2020, 01/29/2021, 03/05/2022   Fluad Trivalent(High Dose 65+) 03/08/2023   Influenza Split 02/13/2012, 03/06/2014   Influenza, High Dose Seasonal PF 01/21/2017, 01/31/2019   Influenza,inj,Quad PF,6+ Mos 04/06/2011, 01/12/2018   Influenza-Unspecified 03/31/2010, 02/09/2012, 03/06/2014, 02/28/2015, 03/14/2016, 01/21/2017, 01/29/2017,  02/02/2019, 01/30/2020, 02/06/2020, 02/18/2022   Moderna Covid-19 Fall Seasonal Vaccine 36yrs & older 04/25/2023   PFIZER(Purple Top)SARS-COV-2 Vaccination 08/03/2019, 08/19/2019, 08/31/2019, 06/11/2020   Pneumococcal Conjugate-13 01/21/2017   Pneumococcal Polysaccharide-23 09/07/2000, 07/31/2015, 02/13/2020   Pneumococcal-Unspecified 08/29/2009   Td 04/10/2001   Tdap 07/07/2011, 01/29/2013, 07/31/2015, 10/28/2022   Zoster Recombinant(Shingrix) 10/29/2020, 05/13/2021    TDAP status: Up to date  Flu Vaccine status: Up to date  Pneumococcal vaccine status: Up to date  Covid-19 vaccine status: Information provided on how to obtain vaccines.   Qualifies for Shingles Vaccine? Yes   Zostavax completed No   Shingrix Completed?: Yes  Screening Tests Health Maintenance  Topic Date Due   Diabetic kidney evaluation - eGFR measurement  01/30/2023   OPHTHALMOLOGY EXAM  06/12/2023   HEMOGLOBIN A1C  07/07/2023   FOOT EXAM  09/08/2023   COVID-19 Vaccine (6 - Pfizer risk 2024-25 season) 10/23/2023   Diabetic kidney evaluation - Urine ACR  12/21/2023   INFLUENZA VACCINE  12/30/2023   Medicare Annual Wellness (AWV)  09/20/2024   Colonoscopy  02/23/2026   DTaP/Tdap/Td (6 - Td or Tdap) 10/27/2032   Pneumonia Vaccine 14+ Years old  Completed   Hepatitis C Screening  Completed   Zoster Vaccines- Shingrix  Completed   HPV VACCINES  Aged Out   Meningococcal B Vaccine  Aged Out    Health Maintenance  Health Maintenance Due  Topic Date Due   Diabetic kidney evaluation - eGFR measurement  01/30/2023   OPHTHALMOLOGY EXAM  06/12/2023   HEMOGLOBIN A1C  07/07/2023   FOOT EXAM  09/08/2023    Colorectal cancer screening: Type of screening: Colonoscopy. Completed 02/24/2016. Repeat every 10 years  Lung Cancer Screening: (Low Dose CT Chest recommended if Age 28-80 years, 20 pack-year currently smoking OR have quit w/in 15years.) does not qualify.   Lung Cancer Screening Referral:  n/a  Additional Screening:  Hepatitis C Screening: does qualify; Completed 01/12/2018  Vision Screening: Recommended annual ophthalmology exams for early detection of glaucoma and other disorders of the eye. Is the patient up to date with their annual eye exam?  Yes  Who is the provider or what is the name of the office in which the patient attends annual eye exams? [facility] If pt is not established with a provider, would they  like to be referred to a provider to establish care?  N/a .   Dental Screening: Recommended annual dental exams for proper oral hygiene  Diabetic Foot Exam: Diabetic Foot Exam: Overdue, Pt has been advised about the importance in completing this exam. Pt is scheduled for diabetic foot exam on not scheduled. He has a podiatrist.   Publishing rights manager Referral / Chronic Care Management: CRR required this visit?  No   CCM required this visit?  No     Plan:     I have personally reviewed and noted the following in the patient's chart:   Medical and social history Use of alcohol, tobacco or illicit drugs  Current medications and supplements including opioid prescriptions. Patient is not currently taking opioid prescriptions. Functional ability and status Nutritional status Physical activity Advanced directives List of other physicians Hospitalizations, surgeries, and ER visits in previous 12 months. None Vitals Screenings to include cognitive, depression, and falls Referrals and appointments  In addition, I have reviewed and discussed with patient certain preventive protocols, quality metrics, and best practice recommendations. A written personalized care plan for preventive services as well as general preventive health recommendations were provided to patient.     Aubrey Leaf, [place]   09/21/2023   After Visit Summary: (MyChart) Due to this being a telephonic visit, the after visit summary with patients personalized plan was offered to patient via MyChart    Nurse Notes:    Gregory Kinnier [name] is a 73 y.o. male patient of Adela Holter, DO who had a Medicare Annual Wellness Visit today via telephone. Bookert is Retired and lives with their spouse. He has 2 children. He reports that he is socially active and does interact with friends/family regularly. He is moderately physically active and enjoys reading the bible and fishing.

## 2023-09-21 NOTE — Patient Instructions (Signed)
 Gregory West , Thank you for taking time to come for your Medicare Wellness Visit. I appreciate your ongoing commitment to your health goals. Please review the following plan we discussed and let me know if I can assist you in the future.   These are the goals we discussed:  Goals       Patient Stated (pt-stated)      09/15/2020 AWV Goal: Diabetes Management  Patient will maintain an A1C level below 7.0 Patient will not develop any diabetic foot complications Patient will not experience any hypoglycemic episodes over the next 3 months Patient will notify our office of any CBG readings outside of the provider recommended range by calling [phone] Patient will adhere to provider recommendations for diabetes management  Patient Self Management Activities take all medications as prescribed and report any negative side effects monitor and record blood sugar readings as directed adhere to a low carbohydrate diet that incorporates lean proteins, vegetables, whole grains, low glycemic fruits check feet daily noting any sores, cracks, injuries, or callous formations see PCP or podiatrist if he notices any changes in his legs, feet, or toenails Patient will visit PCP and have an A1C level checked every 3 to 6 months as directed  have a yearly eye exam to monitor for vascular changes associated with diabetes and will request that the report be sent to his pcp.  consult with his PCP regarding any changes in his health or new or worsening symptoms       Patient Stated (pt-stated)      09/16/2021 AWV Goal: Diabetes Management  Patient will maintain an A1C level below 7.0 Patient will not develop any diabetic foot complications Patient will not experience any hypoglycemic episodes over the next 3 months Patient will notify our office of any CBG readings outside of the provider recommended range by calling [phone] Patient will adhere to provider recommendations for diabetes  management  Patient Self Management Activities take all medications as prescribed and report any negative side effects monitor and record blood sugar readings as directed adhere to a low carbohydrate diet that incorporates lean proteins, vegetables, whole grains, low glycemic fruits check feet daily noting any sores, cracks, injuries, or callous formations see PCP or podiatrist if he notices any changes in his legs, feet, or toenails Patient will visit PCP and have an A1C level checked every 3 to 6 months as directed  have a yearly eye exam to monitor for vascular changes associated with diabetes and will request that the report be sent to his pcp.  consult with his PCP regarding any changes in his health or new or worsening symptoms       Patient Stated (pt-stated)      Patient stated that he would like to loose 20 lbs.      Patient Stated      He would like to be around 225 lbs by the end of the year.         This is a list of the screening recommended for you and due dates:  Health Maintenance  Topic Date Due   Yearly kidney function blood test for diabetes  01/30/2023   Eye exam for diabetics  06/12/2023   Hemoglobin A1C  07/07/2023   Complete foot exam   09/08/2023   COVID-19 Vaccine (6 - Pfizer risk 2024-25 season) 10/23/2023   Yearly kidney health urinalysis for diabetes  12/21/2023   Flu Shot  12/30/2023   Medicare Annual Wellness Visit  09/20/2024  Colon Cancer Screening  02/23/2026   DTaP/Tdap/Td vaccine (6 - Td or Tdap) 10/27/2032   Pneumonia Vaccine  Completed   Hepatitis C Screening  Completed   Zoster (Shingles) Vaccine  Completed   HPV Vaccine  Aged Out   Meningitis B Vaccine  Aged Out

## 2023-09-29 ENCOUNTER — Other Ambulatory Visit: Payer: Self-pay

## 2023-09-29 ENCOUNTER — Ambulatory Visit: Admission: EM | Admit: 2023-09-29 | Discharge: 2023-09-29 | Disposition: A | Discharge status: Home / Self Care

## 2023-09-29 DIAGNOSIS — M545 Low back pain, unspecified: Secondary | ICD-10-CM | POA: Diagnosis not present

## 2023-09-29 DIAGNOSIS — R1031 Right lower quadrant pain: Secondary | ICD-10-CM | POA: Diagnosis not present

## 2023-09-29 DIAGNOSIS — Z87440 Personal history of urinary (tract) infections: Secondary | ICD-10-CM | POA: Diagnosis not present

## 2023-09-29 DIAGNOSIS — M549 Dorsalgia, unspecified: Secondary | ICD-10-CM | POA: Diagnosis not present

## 2023-09-29 DIAGNOSIS — Z87891 Personal history of nicotine dependence: Secondary | ICD-10-CM | POA: Diagnosis not present

## 2023-09-29 DIAGNOSIS — E119 Type 2 diabetes mellitus without complications: Secondary | ICD-10-CM | POA: Diagnosis not present

## 2023-09-29 DIAGNOSIS — R109 Unspecified abdominal pain: Secondary | ICD-10-CM | POA: Diagnosis not present

## 2023-09-29 DIAGNOSIS — D72829 Elevated white blood cell count, unspecified: Secondary | ICD-10-CM | POA: Diagnosis not present

## 2023-09-29 DIAGNOSIS — R10813 Right lower quadrant abdominal tenderness: Secondary | ICD-10-CM | POA: Diagnosis not present

## 2023-09-29 DIAGNOSIS — N281 Cyst of kidney, acquired: Secondary | ICD-10-CM | POA: Diagnosis not present

## 2023-09-29 NOTE — ED Notes (Signed)
 Patient is being discharged from the Urgent Care and sent to the Emergency Department via POV. Per Dr Nicholette Barley, patient is in need of higher level of care due to RT sided abd pain and need for advanced imaging. Patient is aware and verbalizes understanding of plan of care.  Vitals:   09/29/23 1004  BP: 129/77  Pulse: 80  Resp: 16  Temp: 98.1 F (36.7 C)  SpO2: 96%

## 2023-09-29 NOTE — ED Triage Notes (Addendum)
 Nagging right lower abd pain since yesterday. Pain with palpation. Has difficulty walking d/t pain, does not feel pain much when sitting. No n/v/d. Maybe had fever Monday. Coughing up phlegm from pollen. Has had coricidin.

## 2023-09-29 NOTE — ED Provider Notes (Signed)
 [facility]    CSN: [id number] Arrival date & time: 09/29/23  1000      History   Chief Complaint Chief Complaint  Patient presents with   Abdominal Pain    HPI Gregory West [name] is a 73 y.o. male.   Patient is here for abdominal pain.  He has had abdominal pain in his right lower side since yesterday.  Woke up this morning it is much worse.  He did awaken at night with the pain.  He states is worse when he is trying to walk.  He states it hurts if he puts weight on his right leg.  The pain goes through to his back on the right side.  No nausea or vomiting.  No urinary symptoms.  No diarrhea constipation or bowel symptoms.  No fever or chills. Patient has had abdominal surgery, and inguinal hernia repair.  Nothing else Patient states that he had a colonoscopy in 2017.  Not due for 2 years.  He did have diverticula.  I looked at his reports and they were in the sigmoid and descending colon Patient states he did have a kidney stone many years ago.  It has been more than 10 years.  He states this does not feel the same as kidney stone pain.  He has not had any hematuria.  Patient has well-controlled diabetes, hyperlipidemia, hypertension, hypothyroidism.    Past Medical History:  Diagnosis Date   Allergy not sure   Arthritis    Chest pain    a. Normal cors 2001. b. Neg stress test 2012, 05/2014.   Diabetes mellitus without complication (HCC)    GERD (gastroesophageal reflux disease)    Hyperlipidemia    Hypertension    Hypertensive response to exercise    Hypothyroidism    Obesity    Obstructive sleep apnea    Currently untreated    PONV (postoperative nausea and vomiting)    Sleep apnea    Ulcer of left lower extremity (HCC) 12/16/2022    Patient Active Problem List   Diagnosis Date Noted   Viral URI 06/28/2023   Splinter in skin 03/08/2023   COVID 02/02/2023   Bee sting reaction 01/11/2023   Ulcer of left lower extremity (HCC) 12/16/2022    Bilateral hand numbness 10/06/2022   Atypical pigmented lesion 09/29/2022   ED (erectile dysfunction) 09/29/2022   Fusion of spine of cervical region 09/14/2022   Bruising 09/08/2022   Acute tear of posterior aspect of lateral meniscus of left knee 01/29/2022   Bilateral third flexor stenosing tenosynovitis and first carpometacarpal osteoarthritis. 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   Hypertension associated with diabetes (HCC) 07/13/2017   Benign essential tremor 07/13/2017   Kidney cyst, acquired 01/24/2017   Seborrheic keratoses 04/01/2016   Diverticulosis of colon without hemorrhage 02/26/2016   Anxiety state 10/02/2015   BPH (benign prostatic hyperplasia) 07/31/2015   Carpal tunnel syndrome, left upper limb 06/04/2015   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   Vitamin D  deficiency 03/06/2015   History of pancreatitis 03/06/2015   Lumbar spondylosis 03/04/2015   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   Obstructive sleep apnea 05/15/2014   Hyperlipidemia  05/15/2014   Glaucoma suspect 10/21/2011    Past Surgical History:  Procedure Laterality Date   CARDIAC CATHETERIZATION     CERVICAL FUSION     HERNIA REPAIR     KNEE ARTHROSCOPY  1984   both knee   [facility]   SHOULDER ARTHROSCOPY WITH SUBACROMIAL DECOMPRESSION Right 09/13/2019   Procedure: RIGHT SHOULDER ARTHROSCOPY WITH EXTENSIVE DEBRIDEMENT, SUBACROMIAL DECOMPRESSION,;  Surgeon: Arnie Lao, MD;  Location: [facility];  Service: Orthopedics;  Laterality:  Right;   SPINE SURGERY  1st Sept 1977   2nd Feb 2011       Home Medications    Prior to Admission medications   Medication Sig Start Date End Date Taking? Authorizing Provider  acetaminophen  (TYLENOL ) 650 MG CR tablet Take 1 tablet (650 mg total) by mouth every 8 (eight) hours as needed for pain. 01/13/22   Gean Keels, MD  aspirin 81 MG tablet Take 81 mg by mouth daily.    [provider]  carboxymethylcellulose (REFRESH PLUS) 0.5 % SOLN  07/16/20   [provider]  celecoxib  (CELEBREX ) 200 MG capsule One to 2 tablets by mouth daily as needed for pain. 10/30/21   Gean Keels, MD  Cholecalciferol (VITAMIN D3) 5000 units CAPS Take 1 capsule by mouth daily.    [provider]  clotrimazole -betamethasone  (LOTRISONE ) cream Apply 1 application. topically 2 (two) times daily. X 10 days 09/15/21   Cydney Draft, MD  Continuous Glucose Sensor MISC by Does not apply route. dexacom    [provider]  cyclobenzaprine (FLEXERIL) 10 MG tablet  10/29/20   [provider]  diphenhydrAMINE -zinc  acetate (BENADRYL ) cream Apply topically 3 (three) times daily as needed for itching. 01/11/23   Josepha Nickels, DO  Dulaglutide 3 MG/0.5ML SOPN Inject 3 mg into the skin once a week. 05/03/21   [provider]  empagliflozin  (JARDIANCE ) 25 MG TABS tablet Take 1 tablet (25 mg total) by mouth daily. 09/03/22   Sonny Dust, MD  Evolocumab  (REPATHA  SURECLICK) 140 MG/ML SOAJ Inject 140 mg into the skin every 14 (fourteen) days. 01/26/23   [place], Peter M, MD  ezetimibe  (ZETIA ) 10 MG tablet Take 1 tablet (10 mg total) by mouth daily. 09/03/22   Sonny Dust, MD  guaiFENesin -codeine  100-10 MG/5ML syrup Take 5 mLs by mouth 3 (three) times daily as needed for cough. 06/28/23   Josepha Nickels, DO  insulin glargine-yfgn (SEMGLEE) 100 UNIT/ML injection 20 Units daily. 10/29/20   [provider]  latanoprost (XALATAN) 0.005 %  ophthalmic solution  07/16/20   [provider]  levothyroxine (SYNTHROID) 25 MCG tablet Take 25 mcg by mouth daily before breakfast.    [provider]  losartan  (COZAAR ) 25 MG tablet Take 1 tablet (25 mg total) by mouth daily. 04/22/23   Adela Holter, DO  metFORMIN  (GLUCOPHAGE -XR) 750 MG 24 hr tablet TAKE 1 TABLET TWICE A DAY BEFORE MEALS 04/22/23   Adela Holter, DO  Omega-3 Fatty Acids (FISH OIL) 1000 MG CAPS Take by mouth. Takes 3 capsules a day    [provider]  omeprazole  (PRILOSEC) 40 MG capsule TAKE 1 CAPSULE (40 MG TOTAL) BY MOUTH DAILY. 10/26/22   Adela Holter, DO  propranolol  ER (INDERAL  LA) 60 MG 24 hr capsule TAKE 1 CAPSULE BY MOUTH EVERY DAY 04/22/23   Adela Holter, DO  rosuvastatin  (CRESTOR ) 5 MG tablet Take 1 tablet (5 mg total) by mouth daily. 09/03/22  Sonny Dust, MD  Semaglutide, 2 MG/DOSE, (OZEMPIC, 2 MG/DOSE,) 8 MG/3ML SOPN Inject 2 mg into the skin once a week.    [provider]  tacrolimus  (PROTOPIC ) 0.1 % ointment Apply topically daily. Alternate with Triamcinolone  every 2 weeks. 07/11/23   Dellar Fenton, DO  tamsulosin  (FLOMAX ) 0.4 MG CAPS capsule TAKE 1 CAPSULE BY MOUTH EVERY DAY 05/03/23   Adela Holter, DO  traZODone  (DESYREL ) 50 MG tablet TAKE ONE-HALF (1/2) TO ONE TABLET AT BEDTIME AS NEEDED FOR SLEEP 09/08/22   Adela Holter, DO  vitamin B-12 (CYANOCOBALAMIN ) 500 MCG tablet  10/29/20   [provider]    Family History Family History  Problem Relation Age of Onset   Diabetes Mother    Heart disease Mother    Hypertension Mother    Stroke Mother    Heart attack Mother    COPD Mother    Cancer Father    Heart disease Father    Diabetes Father    Hyperlipidemia Sister    Hypertension Sister    Diabetes Maternal Grandmother    Stroke Maternal Grandmother    Heart attack Maternal Grandmother    Heart disease Maternal Grandmother    Diabetes Maternal Grandfather    Hypertension Maternal  Grandfather    Heart attack Maternal Grandfather    Heart disease Maternal Grandfather    Diabetes Sister    Hypertension Sister    Cancer Sister    Hyperlipidemia Sister    Heart attack Paternal Grandmother    Heart attack Paternal Grandfather    Cancer Sister    Hyperlipidemia Sister    COPD Maternal Aunt    Hypertension Maternal Aunt    Diabetes Son    Diabetes Maternal Aunt     Social History Social History   Tobacco Use   Smoking status: Former    Current packs/day: 0.00    Average packs/day: 1.5 packs/day for 30.0 years (45.0 ttl pk-yrs)    Types: Cigarettes    Start date: 06/01/1963    Quit date: 05/31/1993    Years since quitting: 30.3   Smokeless tobacco: Never   Tobacco comments:    24 years smoke free and alcohol free  Vaping Use   Vaping status: Never Used  Substance Use Topics   Alcohol use: No   Drug use: No     Allergies   Statins, Liraglutide, and Lisinopril   Review of Systems Review of Systems See HPI  Physical Exam Triage Vital Signs ED Triage Vitals  Encounter Vitals Group     BP 09/29/23 1004 129/77     Systolic BP Percentile --      Diastolic BP Percentile --      Pulse Rate 09/29/23 1004 80     Resp 09/29/23 1004 16     Temp 09/29/23 1004 98.1 F (36.7 C)     Temp src --      SpO2 09/29/23 1004 96 %     Weight --      Height --      Head Circumference --      Peak Flow --      Pain Score 09/29/23 1008 9     Pain Loc --      Pain Education --      Exclude from Growth Chart --    No data found.  Updated Vital Signs BP 129/77   Pulse 80   Temp 98.1 F (36.7 C)   Resp 16   SpO2  96%      Physical Exam Constitutional:      General: He is not in acute distress.    Appearance: He is well-developed.     Comments: Right.  Uncomfortable.  Guarded movements.  Small steps.  HENT:     Head: Normocephalic and atraumatic.     Mouth/Throat:     Mouth: Mucous membranes are moist.  Eyes:     Conjunctiva/sclera: Conjunctivae  normal.     Pupils: Pupils are equal, round, and reactive to light.  Cardiovascular:     Rate and Rhythm: Normal rate.  Pulmonary:     Effort: Pulmonary effort is normal. No respiratory distress.  Abdominal:     General: Abdomen is protuberant. Bowel sounds are normal. There is no distension.     Palpations: Abdomen is soft. There is no hepatomegaly or splenomegaly.     Tenderness: There is abdominal tenderness in the right lower quadrant. There is guarding and rebound. Negative signs include Murphy's sign.     Hernia: A hernia is present. Hernia is present in the ventral area.  Musculoskeletal:        General: Normal range of motion.     Cervical back: Normal range of motion.  Skin:    General: Skin is warm and dry.  Neurological:     Mental Status: He is alert.      UC Treatments / Results  Labs (all labs ordered are listed, but only abnormal results are displayed) Labs Reviewed - No data to display  EKG   Radiology No results found.  Procedures Procedures (including critical care time)  Medications Ordered in UC Medications - No data to display  Initial Impression / Assessment and Plan / UC Course  I have reviewed the triage vital signs and the nursing notes.  Pertinent labs & imaging results that were available during my care of the patient were reviewed by me and considered in my medical decision making (see chart for details).     I discussed with the patient that he needs medical care beyond what I can provide at the urgent care.  He agrees to go to the [facility] emergency room for additional evaluation and testing Final Clinical Impressions(s) / UC Diagnoses   Final diagnoses:  Acute abdominal pain in right lower quadrant     Discharge Instructions      GO TO ER for additional testing   ED Prescriptions   None    PDMP not reviewed this encounter.   Stephany Ehrich, MD 09/29/23 1249

## 2023-09-29 NOTE — Discharge Instructions (Signed)
GO TO ER for additional testing

## 2023-10-10 ENCOUNTER — Other Ambulatory Visit: Payer: Self-pay | Admitting: Interventional Cardiology

## 2023-10-11 ENCOUNTER — Other Ambulatory Visit (HOSPITAL_COMMUNITY): Payer: Self-pay

## 2023-10-11 ENCOUNTER — Encounter: Payer: Self-pay | Admitting: Family Medicine

## 2023-10-11 ENCOUNTER — Other Ambulatory Visit: Payer: Self-pay | Admitting: Interventional Cardiology

## 2023-10-11 ENCOUNTER — Telehealth: Payer: Self-pay | Admitting: Pharmacy Technician

## 2023-10-11 NOTE — Telephone Encounter (Signed)
 Called patient unable to leave a message he needs to sign a release of information to get records sent over to the [place] He doesn't have one on file

## 2023-10-11 NOTE — Telephone Encounter (Signed)
 Pharmacy Patient Advocate Encounter   Received notification from RX Request Messages that prior authorization for Repatha  is required/requested.   Insurance verification completed.   The patient is insured through [employer] .   Per test claim: PA required; PA submitted to above mentioned insurance via CoverMyMeds Key/confirmation #/EOC  [id number] Status is pending

## 2023-10-12 ENCOUNTER — Other Ambulatory Visit (HOSPITAL_COMMUNITY): Payer: Self-pay

## 2023-10-12 ENCOUNTER — Telehealth: Payer: Self-pay

## 2023-10-12 NOTE — Telephone Encounter (Signed)
 Pharmacy Patient Advocate Encounter   Received notification from Physician's Office that prior authorization for REPATHA  is required/requested.   Insurance verification completed.   The patient is insured through [facility] MEDICARE .   Per test claim:  PRALUENT is preferred by the insurance.  If suggested medication is appropriate, Please send in a new RX and discontinue this one. If not, please advise as to why it's not appropriate so that we may request a Prior Authorization. Please note, some preferred medications may still require a PA.  If the suggested medications have not been trialed and there are no contraindications to their use, the PA will not be submitted, as it will not be approved.

## 2023-10-12 NOTE — Telephone Encounter (Signed)
 Plan prefers Praluent, do you want us  to a Praluent PA instead? If so, what strength?

## 2023-10-13 ENCOUNTER — Telehealth: Payer: Self-pay | Admitting: Pharmacy Technician

## 2023-10-13 ENCOUNTER — Other Ambulatory Visit (HOSPITAL_COMMUNITY): Payer: Self-pay

## 2023-10-13 DIAGNOSIS — I700 Atherosclerosis of aorta: Secondary | ICD-10-CM

## 2023-10-13 DIAGNOSIS — E785 Hyperlipidemia, unspecified: Secondary | ICD-10-CM

## 2023-10-13 NOTE — Telephone Encounter (Signed)
 Pharmacy Patient Advocate Encounter   Received notification from Pt Calls Messages that prior authorization for praluent is required/requested.   Insurance verification completed.   The patient is insured through [facility] .   Per test claim: PA required; PA submitted to above mentioned insurance via CoverMyMeds Key/confirmation #/EOC [id number] Status is pending

## 2023-10-13 NOTE — Telephone Encounter (Signed)
 Pharmacy Patient Advocate Encounter  Received notification from [facility] that Prior Authorization for repatha  has been DENIED.  Full denial letter will be uploaded to the media tab. See denial reason below.   PA #/Case ID/Reference #: [id number]

## 2023-10-14 NOTE — Telephone Encounter (Signed)
 Pharmacy Patient Advocate Encounter  Received notification from [facility] that Prior Authorization for praluent 75mg /ml has been APPROVED from 09/29/23 to until further notice   PA #/Case ID/Reference #: [id number]

## 2023-10-18 MED ORDER — PRALUENT 75 MG/ML ~~LOC~~ SOAJ
75.0000 mg | SUBCUTANEOUS | 5 refills | Status: AC
Start: 2023-10-18 — End: ?

## 2023-10-18 NOTE — Addendum Note (Signed)
 Addended by: Sunny English on: 10/18/2023 07:57 AM   Modules accepted: Orders

## 2023-10-19 NOTE — Telephone Encounter (Signed)
 [facility] called [facility] and they said they received the praluent rx. It had to be ordered and it will be in later today. Praluent went through insurance.

## 2023-10-21 ENCOUNTER — Encounter: Payer: Self-pay | Admitting: Emergency Medicine

## 2023-10-21 ENCOUNTER — Ambulatory Visit: Attending: Emergency Medicine | Admitting: Emergency Medicine

## 2023-10-21 VITALS — BP 122/82 | Ht 69.5 in | Wt 261.4 lb

## 2023-10-21 DIAGNOSIS — G4733 Obstructive sleep apnea (adult) (pediatric): Secondary | ICD-10-CM

## 2023-10-21 DIAGNOSIS — E785 Hyperlipidemia, unspecified: Secondary | ICD-10-CM

## 2023-10-21 DIAGNOSIS — I714 Abdominal aortic aneurysm, without rupture, unspecified: Secondary | ICD-10-CM

## 2023-10-21 DIAGNOSIS — I10 Essential (primary) hypertension: Secondary | ICD-10-CM | POA: Diagnosis not present

## 2023-10-21 DIAGNOSIS — E118 Type 2 diabetes mellitus with unspecified complications: Secondary | ICD-10-CM | POA: Diagnosis not present

## 2023-10-21 DIAGNOSIS — Z79899 Other long term (current) drug therapy: Secondary | ICD-10-CM | POA: Diagnosis not present

## 2023-10-21 DIAGNOSIS — R079 Chest pain, unspecified: Secondary | ICD-10-CM | POA: Diagnosis not present

## 2023-10-21 LAB — COMPREHENSIVE METABOLIC PANEL WITH GFR
ALT: 20 IU/L (ref 0–44)
AST: 17 IU/L (ref 0–40)
Albumin: 4.1 g/dL (ref 3.8–4.8)
Alkaline Phosphatase: 80 IU/L (ref 44–121)
BUN/Creatinine Ratio: 8 — ABNORMAL LOW (ref 10–24)
BUN: 7 mg/dL — ABNORMAL LOW (ref 8–27)
Bilirubin Total: 0.3 mg/dL (ref 0.0–1.2)
CO2: 22 mmol/L (ref 20–29)
Calcium: 8.7 mg/dL (ref 8.6–10.2)
Chloride: 104 mmol/L (ref 96–106)
Creatinine, Ser: 0.86 mg/dL (ref 0.76–1.27)
Globulin, Total: 2.8 g/dL (ref 1.5–4.5)
Glucose: 101 mg/dL — ABNORMAL HIGH (ref 70–99)
Potassium: 4.4 mmol/L (ref 3.5–5.2)
Sodium: 142 mmol/L (ref 134–144)
Total Protein: 6.9 g/dL (ref 6.0–8.5)
eGFR: 93 mL/min/{1.73_m2} (ref >59)

## 2023-10-21 NOTE — Progress Notes (Signed)
 Cardiology Office Note:    Date:  10/21/2023  ID:  Gregory Kinnier [name], DOB [birth year], MRN [id number] PCP: Adela Holter, DO  [facility] Providers Cardiologist:  None       Patient Profile:       Chief Complaint: 1 year follow-up History of Present Illness:  Gregory STOGSDILL [name] is a 73 y.o. male with visit-pertinent history of type 2 diabetes, obstructive sleep apnea, hyperlipidemia, AAA, hypertension  Lexiscan Myoview 07/2015 was low risk study with no ischemia.  Coronary CTA 07/2018 with coronary calcium  score of 0.  He has been followed by the [facility] for a small AAA.  Patient was last seen in office on 09/03/2022 by Dr. Ardell Beauvais.  He was doing well without cardiovascular concerns or complaints.  No changes were made to his current regimen.  He was to follow-up in 1 year   Discussed the use of AI scribe software for clinical note transcription with the patient, who gave verbal consent to proceed.  History of Present Illness Gregory Berta Brittle [name] "Sharin David" is a 73 year old male with coronary artery disease who presents for a one-year follow-up.  Today patient is without any acute cardiovascular concerns or complaints.  He is a retired [employer].  He denies any chest pains, shortness of breath, or other exertional symptoms.  He is on Praluent for cholesterol management after switching from Repatha  due to insurance issues, with a one-month gap between medications. He experienced no side effects from the new medication.  His diabetes is managed with an A1c last recorded at 6.6, though he suspects it may be higher now due to dietary challenges. He is concerned about the impact of his diet, particularly soft drink consumption, on his glucose control.  He denies chest pain, shortness of breath, lower extremity edema, fatigue, palpitations, melena, hematuria, hemoptysis, diaphoresis, weakness, presyncope, syncope, orthopnea, and PND.   Review of systems:  Please see the history  of present illness. All other systems are reviewed and otherwise negative.      Studies Reviewed:    EKG Interpretation Date/Time:  Friday Oct 21 2023 08:31:51 EDT Ventricular Rate:  72 PR Interval:  156 QRS Duration:  74 QT Interval:  388 QTC Calculation: 424 R Axis:   2  Text Interpretation: Normal sinus rhythm Normal ECG When compared with ECG of 08-Aug-2014 11:56, No significant change was found Confirmed by Palmer Bobo [id number]) on 10/21/2023 10:02:52 AM    Coronary CTA 07/04/2018 1. Calcium  score 0   2.  Normal co dominant coronary arteries   3.  Aortic root 3.8 cm  Risk Assessment/Calculations:              Physical Exam:   VS:  BP 122/82 (BP Location: Left Arm, Patient Position: Sitting)   Ht 5' 9.5" (1.765 m)   Wt 261 lb 6.4 oz (118.6 kg)   SpO2 96%   BMI 38.05 kg/m    Wt Readings from Last 3 Encounters:  10/21/23 261 lb 6.4 oz (118.6 kg)  09/21/23 255 lb (115.7 kg)  06/28/23 256 lb 12 oz (116.5 kg)    GEN: Well nourished, well developed in no acute distress NECK: No JVD; No carotid bruits CARDIAC: RRR, no murmurs, rubs, gallops RESPIRATORY:  Clear to auscultation without rales, wheezing or rhonchi  ABDOMEN: Soft, non-tender, non-distended EXTREMITIES:  No edema; No acute deformity      Assessment and Plan:  Hypertension Blood pressure today is 122/82 and under adequate control - Maintain home  BP log - Continue losartan  25 mg daily and propranolol  60 mg daily  Hyperlipidemia LDL 57 on 03/2023 Currently under excellent control and under goal less than 70 - Repeat fasting lipid panel and CMET today - Recently switched from Repatha  to Praluent  due to insurance issues.  Had 1 month gap in between - Continue Praluent  75 mg q. 14 days, rosuvastatin  5 mg daily and Zetia  10 mg daily  Type 2 diabetes A1c 6.6 on 12/2022 - Managed by endocrinology on metformin , Ozempic, insulin glargine, Jardiance   AAA Being monitored at the [facility] - Recent [facility] abdomen at  [facility] on 09/29/2023 showed normal abdominal aorta - Continue adequate blood pressure control - Continue aspirin, Crestor , Zetia , Praluent   Obstructive sleep apnea Remains adherent to CPAP  H/o Chest pain Coronary CTA 2020 with normal coronaries and coronary calcium  score of 0 - EKG today showed NSR without acute ischemic changes - Today he is without any anginal symptoms, no indication for further ischemic evaluation at this time       Dispo:  Return in about 1 year (around 10/20/2024).  Signed, Ava Boatman, NP

## 2023-10-21 NOTE — Patient Instructions (Addendum)
 Medication Instructions:  NO CHANGES   Lab Work: FASTING LIPID PANEL AND CMET TO BE DONE TODAY.   Testing/Procedures: NONE  Follow-Up: At [facility], you and your health needs are our priority.  As part of our continuing mission to provide you with exceptional heart care, our providers are all part of one team.  This team includes your primary Cardiologist (physician) and Advanced Practice Providers or APPs (Physician Assistants and Nurse Practitioners) who all work together to provide you with the care you need, when you need it.  Your next appointment:   1 YEAR  Provider:   MADISON FOUNTAIN, DNP

## 2023-10-22 LAB — LIPID PANEL
Chol/HDL Ratio: 3.5 ratio (ref 0.0–5.0)
Cholesterol, Total: 124 mg/dL (ref 100–199)
HDL: 35 mg/dL — ABNORMAL LOW (ref >39)
LDL Chol Calc (NIH): 76 mg/dL (ref 0–99)
Triglycerides: 62 mg/dL (ref 0–149)
VLDL Cholesterol Cal: 13 mg/dL (ref 5–40)

## 2023-10-25 ENCOUNTER — Ambulatory Visit: Payer: Self-pay | Admitting: Emergency Medicine

## 2023-10-27 ENCOUNTER — Other Ambulatory Visit: Payer: Self-pay | Admitting: Family Medicine

## 2023-10-27 ENCOUNTER — Ambulatory Visit: Payer: Medicare Other | Admitting: Dermatology

## 2023-10-27 ENCOUNTER — Encounter: Payer: Self-pay | Admitting: Dermatology

## 2023-10-27 VITALS — BP 124/80 | HR 76

## 2023-10-27 DIAGNOSIS — I872 Venous insufficiency (chronic) (peripheral): Secondary | ICD-10-CM

## 2023-10-27 DIAGNOSIS — L299 Pruritus, unspecified: Secondary | ICD-10-CM

## 2023-10-27 DIAGNOSIS — Z79899 Other long term (current) drug therapy: Secondary | ICD-10-CM | POA: Insufficient documentation

## 2023-10-27 DIAGNOSIS — E7800 Pure hypercholesterolemia, unspecified: Secondary | ICD-10-CM | POA: Insufficient documentation

## 2023-10-27 DIAGNOSIS — N451 Epididymitis: Secondary | ICD-10-CM

## 2023-10-27 DIAGNOSIS — L820 Inflamed seborrheic keratosis: Secondary | ICD-10-CM

## 2023-10-27 MED ORDER — TRIAMCINOLONE ACETONIDE 0.1 % EX CREA: 1.0000 | TOPICAL_CREAM | Freq: Two times a day (BID) | CUTANEOUS | 1 refills | Status: AC

## 2023-10-27 NOTE — Telephone Encounter (Unsigned)
 Copied from CRM [id number]. Topic: Clinical - Medication Refill >> Oct 27, 2023 12:16 PM Blair Bumpers wrote: Medication: traZODone  (DESYREL ) 50 MG tablet   Has the patient contacted their pharmacy? Yes, pharmacy told patient to contact clinic. (Agent: If no, request that the patient contact the pharmacy for the refill. If patient does not wish to contact the pharmacy document the reason why and proceed with request.) (Agent: If yes, when and what did the pharmacy advise?)  This is the patient's preferred pharmacy:  [phone] [place] [place] - [address] [place] [address] Phone: [phone] Fax: [phone]  Is this the correct pharmacy for this prescription? Yes If no, delete pharmacy and type the correct one.   Has the prescription been filled recently? Yes  Is the patient out of the medication? Yes  Has the patient been seen for an appointment in the last year OR does the patient have an upcoming appointment? Yes  Can we respond through MyChart? Yes  Agent: Please be advised that Rx refills may take up to 3 business days. We ask that you follow-up with your pharmacy.

## 2023-10-27 NOTE — Patient Instructions (Addendum)
 Cryotherapy Aftercare  Wash gently with soap and water everyday.   Apply Vaseline and Band-Aid daily until healed.   Important Information   Due to recent changes in healthcare laws, you may see results of your pathology and/or laboratory studies on MyChart before the doctors have had a chance to review them. We understand that in some cases there may be results that are confusing or concerning to you. Please understand that not all results are received at the same time and often the doctors may need to interpret multiple results in order to provide you with the best plan of care or course of treatment. Therefore, we ask that you please give [place] 2 business days to thoroughly review all your results before contacting the office for clarification. Should we see a critical lab result, you will be contacted sooner.     If You Need Anything After Your Visit   If you have any questions or concerns for your doctor, please call our main line at [phone]. If no one answers, please leave a voicemail as directed and we will return your call as soon as possible. Messages left after 4 pm will be answered the following business day.    You may also send [place] a message via MyChart. We typically respond to MyChart messages within 1-2 business days.  For prescription refills, please ask your pharmacy to contact our office. Our fax number is [phone].  If you have an urgent issue when the clinic is closed that cannot wait until the next business day, you can page your doctor at the number below.     Please note that while we do our best to be available for urgent issues outside of office hours, we are not available 24/7.    If you have an urgent issue and are unable to reach [place], you may choose to seek medical care at your doctor's office, retail clinic, urgent care center, or emergency room.   If you have a medical emergency, please immediately call 911 or go to the emergency department. In the event  of inclement weather, please call our main line at [phone] for an update on the status of any delays or closures.  Dermatology Medication Tips: Please keep the boxes that topical medications come in in order to help keep track of the instructions about where and how to use these. Pharmacies typically print the medication instructions only on the boxes and not directly on the medication tubes.   If your medication is too expensive, please contact our office at [phone] or send [place] a message through MyChart.    We are unable to tell what your co-pay for medications will be in advance as this is different depending on your insurance coverage. However, we may be able to find a substitute medication at lower cost or fill out paperwork to get insurance to cover a needed medication.    If a prior authorization is required to get your medication covered by your insurance company, please allow [place] 1-2 business days to complete this process.   Drug prices often vary depending on where the prescription is filled and some pharmacies may offer cheaper prices.   The website www.goodrx.com contains coupons for medications through different pharmacies. The prices here do not account for what the cost may be with help from insurance (it may be cheaper with your insurance), but the website can give you the price if you did not use any insurance.  - You can print the  associated coupon and take it with your prescription to the pharmacy.  - You may also stop by our office during regular business hours and pick up a GoodRx coupon card.  - If you need your prescription sent electronically to a different pharmacy, notify our office through [facility] or by phone at [phone]

## 2023-10-27 NOTE — Progress Notes (Signed)
   Follow-Up Visit   Subjective  Gregory West [name] is a 73 y.o. male who presents for the following: Nummular Eczema and Stasis Dermatitis  Patient present today for follow up visit for Nummular Eczema. Patient was last evaluated on 07/25/23. At this visit patient was prescribed Tacrolimus  & TMC to apply on the Lower Leg. Patient reports sxs are improved - itch has decreased from 9 out of 10 to 2 out of 10. He states he does wear compression hose about 3 times weekly and lymphatic pump that he will use 1-2 times weekly. Patient denies medication changes.  The following portions of the chart were reviewed this encounter and updated as appropriate: medications, allergies, medical history  Review of Systems:  No other skin or systemic complaints except as noted in HPI or Assessment and Plan.  Objective  Well appearing patient in no apparent distress; mood and affect are within normal limits.  A focused examination was performed of the following areas: B/L Axilla  Relevant exam findings are noted in the Assessment and Plan.                       Back (20) Erythematous stuck-on, waxy papule or plaque  Assessment & Plan   Stasis Dermatitis  Assessment:  Patient reports significant improvement in itching symptoms, from 8-9/10 to 2/10 since starting prescribed creams. Legs show decreased contrast between light and dark areas, indicating improvement. Underarm itching has improved, but back itching persists. Visual examination confirms improvement in leg appearance compared to February. Chronic scratching due to long-standing untreated inflammation has led to secondary skin changes. Complete resolution is unlikely due to the chronic nature of the condition.  - Plan:    Continue current treatment regimen:     - Alternate triamcinolone  and tacrolimus  creams, 2 weeks each     - Refill triamcinolone  prescription    Follow up in 6 months to assess further improvement     Patient education:     - Emphasize importance of continued treatment to prevent recurrence of itching and inflammation  2. Seborrheic Keratoses - Assessment:  Patient reports persistent itching on the back. Examination reveals seborrheic keratoses, which are unrelated to the eczema but can cause itching. These growths are particularly bothersome in the anchoring area on both sides of the back.  - Plan:    Perform cryotherapy on seborrheic keratoses using liquid nitrogen    Patient education:     - Apply Vaseline to treated areas to aid healing     - Option to freeze more lesions at next visit if desired    Follow up as needed for additional cryotherapy  INFLAMED SEBORRHEIC KERATOSIS (20) Back (20) Symptomatic, irritating, patient would like treated.  Benign-appearing.  Call clinic for new or changing lesions.   Destruction of lesion - Back (20) Complexity: simple   Destruction method: cryotherapy   Informed consent: discussed and consent obtained   Timeout:  patient name, date of birth, surgical site, and procedure verified Lesion destroyed using liquid nitrogen: Yes   Region frozen until ice ball extended beyond lesion: Yes   Outcome: patient tolerated procedure well with no complications   Post-procedure details: wound care instructions given    No follow-ups on file.  I, Jetta Ager, am acting as Neurosurgeon for [employer], DO.  Documentation: I have reviewed the above documentation for accuracy and completeness, and I agree with the above.  Louana Roup, DO

## 2023-10-28 MED ORDER — TRAZODONE HCL 50 MG PO TABS: ORAL_TABLET | ORAL | 0 refills | Status: DC

## 2023-10-31 NOTE — Telephone Encounter (Signed)
 Scheduled

## 2023-10-31 NOTE — Telephone Encounter (Signed)
 Pls contact the pt to schedule appt for DM & HTN with Dr. Augustus Ledger. Sending 30 day med refills. Thx.

## 2023-11-01 ENCOUNTER — Other Ambulatory Visit: Payer: Self-pay

## 2023-11-01 DIAGNOSIS — I10 Essential (primary) hypertension: Secondary | ICD-10-CM

## 2023-11-01 DIAGNOSIS — E782 Mixed hyperlipidemia: Secondary | ICD-10-CM

## 2023-11-01 DIAGNOSIS — E785 Hyperlipidemia, unspecified: Secondary | ICD-10-CM

## 2023-11-01 MED ORDER — LOSARTAN POTASSIUM 25 MG PO TABS: 25.0000 mg | ORAL_TABLET | Freq: Every day | ORAL | 3 refills | Status: AC

## 2023-11-03 DIAGNOSIS — E1142 Type 2 diabetes mellitus with diabetic polyneuropathy: Secondary | ICD-10-CM | POA: Diagnosis not present

## 2023-11-03 DIAGNOSIS — B351 Tinea unguium: Secondary | ICD-10-CM | POA: Diagnosis not present

## 2023-11-03 DIAGNOSIS — L84 Corns and callosities: Secondary | ICD-10-CM | POA: Diagnosis not present

## 2023-11-11 ENCOUNTER — Other Ambulatory Visit: Payer: Self-pay | Admitting: Family Medicine

## 2023-11-17 ENCOUNTER — Encounter: Payer: Self-pay | Admitting: Family Medicine

## 2023-11-17 ENCOUNTER — Ambulatory Visit (INDEPENDENT_AMBULATORY_CARE_PROVIDER_SITE_OTHER): Admitting: Family Medicine

## 2023-11-17 VITALS — BP 95/66 | HR 67 | Ht 69.5 in | Wt 262.0 lb

## 2023-11-17 DIAGNOSIS — Z7984 Long term (current) use of oral hypoglycemic drugs: Secondary | ICD-10-CM | POA: Diagnosis not present

## 2023-11-17 DIAGNOSIS — I152 Hypertension secondary to endocrine disorders: Secondary | ICD-10-CM

## 2023-11-17 DIAGNOSIS — E1149 Type 2 diabetes mellitus with other diabetic neurological complication: Secondary | ICD-10-CM | POA: Diagnosis not present

## 2023-11-17 DIAGNOSIS — E1159 Type 2 diabetes mellitus with other circulatory complications: Secondary | ICD-10-CM

## 2023-11-17 LAB — POCT GLYCOSYLATED HEMOGLOBIN (HGB A1C): HbA1c, POC (controlled diabetic range): 6.2 % (ref 0.0–7.0)

## 2023-11-17 NOTE — Progress Notes (Signed)
 Gregory Kinnier [name] - 73 y.o. male MRN [id number]  Date of birth: 09-02-1950  Subjective Chief Complaint  Patient presents with   Diabetes   Hypertension    HPI Gregory Kinnier [name] Is a 73 y.o. male here today for follow up visit.   Reports that he is doing well at this time.  Recevies majority of care and medications through the [place].   He continues to see endocrinology through the [place].  Remains on Lantus, jardiance , Ozempic and metformin .  Doing well with these.  POC A1c today is 6.2%.  He denies hypoglycemia. Using Dexcom to monitor. Very rare lows.   Cholesterol medication changed from repatha  to praluent .  Remains on crestor  and zetia  as well.   BP remains well controlled.  Actually a little low today.  He denies symptoms of hypotension.  He has not had new or worsening chest pain, dyspnea, headache or vision changes.    ROS:  A comprehensive ROS was completed and negative except as noted per HPI     Allergies  Allergen Reactions   Statins     Muscle cramping all over body. Lipitor, Crestor , and pravastatin.   Liraglutide Other (See Comments)    Caused pancreatitis Other reaction(s): pancreas infection   Lisinopril Cough    Past Medical History:  Diagnosis Date   Allergy not sure   Arthritis    Chest pain    a. Normal cors 2001. b. Neg stress test 2012, 05/2014.   Diabetes mellitus without complication (HCC)    GERD (gastroesophageal reflux disease)    Hyperlipidemia    Hypertension    Hypertensive response to exercise    Hypothyroidism    Obesity    Obstructive sleep apnea    Currently untreated    PONV (postoperative nausea and vomiting)    Sleep apnea    Ulcer of left lower extremity (HCC) 12/16/2022    Past Surgical History:  Procedure Laterality Date   CARDIAC CATHETERIZATION     CERVICAL FUSION     HERNIA REPAIR     KNEE ARTHROSCOPY  1984   both knee   [facility]   SHOULDER ARTHROSCOPY WITH SUBACROMIAL  DECOMPRESSION Right 09/13/2019   Procedure: RIGHT SHOULDER ARTHROSCOPY WITH EXTENSIVE DEBRIDEMENT, SUBACROMIAL DECOMPRESSION,;  Surgeon: Arnie Lao, MD;  Location: [facility];  Service: Orthopedics;  Laterality: Right;   SPINE SURGERY  1st Sept 1977   2nd Feb 2011    Social History   Socioeconomic History   Marital status: Married    Spouse name: Devra Fontana   Number of children: 2   Years of education: 16   Highest education level: Bachelor's degree (e.g., BA, AB, BS)  Occupational History    Comment: Retired  Tobacco Use   Smoking status: Former    Current packs/day: 0.00    Average packs/day: 1.5 packs/day for 30.0 years (45.0 ttl pk-yrs)    Types: Cigarettes    Start date: 06/01/1963    Quit date: 05/31/1993    Years since quitting: 30.4   Smokeless tobacco: Never   Tobacco comments:    24 years smoke free and alcohol free  Vaping Use   Vaping status: Never Used  Substance and Sexual Activity   Alcohol use: No   Drug use: No   Sexual activity: Yes    Birth control/protection: None    Comment: Not Necessary  Other Topics Concern   Not on file  Social History Narrative  Lives with his wife. He has two children and three grand children. He likes to read his bible and go fishing in his free time.    Social Drivers of Corporate investment banker Strain: Low Risk  (11/15/2023)   Overall Financial Resource Strain (CARDIA)    Difficulty of Paying Living Expenses: Not very hard  Recent Concern: Financial Resource Strain - Medium Risk (10/31/2023)   Received [facility]   Overall Financial Resource Strain (CARDIA)    Difficulty of Paying Living Expenses: Somewhat hard  Food Insecurity: No Food Insecurity (11/15/2023)   Hunger Vital Sign    Worried About Running Out of Food in the Last Year: Never true    Ran Out of Food in the Last Year: Never true  Transportation Needs: No Transportation Needs (11/15/2023)   PRAPARE - Scientist, research (physical sciences) (Medical): No    Lack of Transportation (Non-Medical): No  Physical Activity: Insufficiently Active (11/15/2023)   Exercise Vital Sign    Days of Exercise per Week: 3 days    Minutes of Exercise per Session: 40 min  Stress: No Stress Concern Present (11/15/2023)   [employer] of Occupational Health - Occupational Stress Questionnaire    Feeling of Stress: Only a little  Social Connections: Socially Integrated (11/15/2023)   Social Connection and Isolation Panel    Frequency of Communication with Friends and Family: Three times a week    Frequency of Social Gatherings with Friends and Family: Twice a week    Attends Religious Services: More than 4 times per year    Active Member of [employer] or Organizations: Yes    Attends Banker Meetings: Patient declined    Marital Status: Married    Family History  Problem Relation Age of Onset   Diabetes Mother    Heart disease Mother    Hypertension Mother    Stroke Mother    Heart attack Mother    COPD Mother    Cancer Father    Heart disease Father    Diabetes Father    Hyperlipidemia Sister    Hypertension Sister    Diabetes Maternal Grandmother    Stroke Maternal Grandmother    Heart attack Maternal Grandmother    Heart disease Maternal Grandmother    Diabetes Maternal Grandfather    Hypertension Maternal Grandfather    Heart attack Maternal Grandfather    Heart disease Maternal Grandfather    Diabetes Sister    Hypertension Sister    Cancer Sister    Hyperlipidemia Sister    Heart attack Paternal Grandmother    Heart attack Paternal Grandfather    Cancer Sister    Hyperlipidemia Sister    COPD Maternal Aunt    Hypertension Maternal Aunt    Diabetes Son    Diabetes Maternal Aunt     Health Maintenance  Topic Date Due   COVID-19 Vaccine (6 - Pfizer risk 2024-25 season) 10/23/2023   Diabetic kidney evaluation - Urine ACR  12/21/2023   INFLUENZA VACCINE  12/30/2023   OPHTHALMOLOGY EXAM   02/14/2024   HEMOGLOBIN A1C  05/18/2024   Medicare Annual Wellness (AWV)  09/20/2024   Diabetic kidney evaluation - eGFR measurement  10/20/2024   FOOT EXAM  11/16/2024   Colonoscopy  02/23/2026   DTaP/Tdap/Td (6 - Td or Tdap) 10/27/2032   Pneumococcal Vaccine: 50+ Years  Completed   Hepatitis C Screening  Completed   Zoster Vaccines- Shingrix  Completed   HPV VACCINES  Aged Out   Meningococcal B Vaccine  Aged Out     ----------------------------------------------------------------------------------------------------------------------------------------------------------------------------------------------------------------- Physical Exam BP 95/66 (BP Location: Left Arm, Patient Position: Sitting, Cuff Size: Large)   Pulse 67   Ht 5' 9.5 (1.765 m)   Wt 262 lb (118.8 kg)   SpO2 98%   BMI 38.14 kg/m   Physical Exam Constitutional:      Appearance: Normal appearance.   Cardiovascular:     Rate and Rhythm: Normal rate and regular rhythm.  Pulmonary:     Effort: Pulmonary effort is normal.     Breath sounds: Normal breath sounds.   Neurological:     General: No focal deficit present.     Mental Status: He is alert.   Psychiatric:        Mood and Affect: Mood normal.        Behavior: Behavior normal.     ------------------------------------------------------------------------------------------------------------------------------------------------------------------------------------------------------------------- Assessment and Plan  Type 2 diabetes mellitus with neurological complications (HCC) Blood sugars remain pretty well-controlled.  Continues to see endocrinology through the [place].  Denies episodes of hypoglycemia.   Hyperlipidemia Doing well with Crestor  and Zetia .  Praluent  added as well and tolerating well.   BPH (benign prostatic hyperplasia) Doing well with flomax .  Hypertension associated with diabetes (HCC) BP remains very well controlled at this time.   Recommend continuation of current medications.     No orders of the defined types were placed in this encounter.   Return in about 1 year (around 11/16/2024) for Hypertension, Type 2 Diabetes.

## 2023-11-17 NOTE — Assessment & Plan Note (Signed)
BP remains very well controlled at this time.  Recommend continuation of current medications.

## 2023-11-17 NOTE — Assessment & Plan Note (Addendum)
Doing well with flomax.

## 2023-11-17 NOTE — Assessment & Plan Note (Signed)
 Doing well with Crestor  and Zetia .  Praluent  added as well and tolerating well.

## 2023-11-17 NOTE — Assessment & Plan Note (Signed)
 Blood sugars remain pretty well-controlled.  Continues to see endocrinology through the [place].  Denies episodes of hypoglycemia.

## 2023-11-22 ENCOUNTER — Other Ambulatory Visit: Payer: Self-pay | Admitting: Family Medicine

## 2023-11-22 DIAGNOSIS — N451 Epididymitis: Secondary | ICD-10-CM

## 2023-12-07 ENCOUNTER — Telehealth: Payer: Self-pay

## 2023-12-07 ENCOUNTER — Other Ambulatory Visit (INDEPENDENT_AMBULATORY_CARE_PROVIDER_SITE_OTHER)

## 2023-12-07 ENCOUNTER — Ambulatory Visit (INDEPENDENT_AMBULATORY_CARE_PROVIDER_SITE_OTHER): Admitting: Orthopaedic Surgery

## 2023-12-07 DIAGNOSIS — M25512 Pain in left shoulder: Secondary | ICD-10-CM

## 2023-12-07 DIAGNOSIS — G8929 Other chronic pain: Secondary | ICD-10-CM

## 2023-12-07 NOTE — Progress Notes (Signed)
 Office Visit Note   Patient: Gregory BONEBRAKE [name]           Date of Birth: [birth year]           MRN: [id number] Visit Date: 12/07/2023              Requested by: Alvia Bring, DO [address] [place],  [place] [address] PCP: Alvia Bring, DO   Assessment & Plan: Visit Diagnoses:  1. Chronic left shoulder pain     Plan: Recent MRI report shows multiple degenerative changes including rotator cuff tendinosis,articular surface fraying of the subscapularis.  He also has intracapsular biceps tendinosis with synovitis with mild/moderate glenohumeral osteoarthritis.  Since patient has aortic aneurysm will need medical clearance by PCP Dr. Bring Alvia.  Will await medical clearance by PCP or cardiologist prior to scheduling surgery.  Patient has attempted conservative treatment for at least 6 consecutive weeks within the past 12 weeks, including but not limited to physical therapy, home exercise program, NSAIDs, activity modification, and/or corticosteroid injections. Despite these efforts, symptoms have not improved or have worsened. Conservative measures have been deemed unsuccessful at this time. After a detailed discussion covering diagnosis and treatment options--including the risks, benefits, alternatives, and potential complications of surgical and nonsurgical management--the patient elected to proceed with surgery  Total face to face encounter time was greater than 25 minutes and over half of this time was spent in counseling and/or coordination of care.  Follow-Up Instructions: No follow-ups on file.   Orders:  Orders Placed This Encounter  Procedures   XR Shoulder Left   No orders of the defined types were placed in this encounter.     Procedures: No procedures performed   Clinical Data: No additional findings.   Subjective: Chief Complaint  Patient presents with   Left Shoulder - Pain    HPI Patient is a 73 year old male with a past  medical history of diabetes, aortic aneurysm, chronic left shoulder pain.  Denies any acute injuries to shoulder.  [facility].  Shoulder has continued to limit his activities of daily living over the past multiple years difficulty with abduction/flexion greater than 60 degrees due to pain.  Stable to have full range of motion with internal/external rotation.  In the past he has tried cortisone injections with little success and still takes Celebrex  for pain relief.  Helps moderately.  Denies any pronounced decreased strength rotator cuff weakness.  Referred by [facility] for assessment for surgical intervention.  Review of Systems  Constitutional: Negative.   HENT: Negative.    Eyes: Negative.   Respiratory: Negative.    Cardiovascular: Negative.   Gastrointestinal: Negative.   Endocrine: Negative.   Genitourinary: Negative.   Skin: Negative.   Allergic/Immunologic: Negative.   Neurological: Negative.   Hematological: Negative.   Psychiatric/Behavioral: Negative.    All other systems reviewed and are negative.   Negative except as noted above  Objective: Vital Signs: There were no vitals taken for this visit.  Physical Exam Vitals and nursing note reviewed.  Constitutional:      Appearance: He is well-developed.  HENT:     Head: Normocephalic and atraumatic.  Eyes:     Pupils: Pupils are equal, round, and reactive to light.  Pulmonary:     Effort: Pulmonary effort is normal.  Abdominal:     Palpations: Abdomen is soft.  Musculoskeletal:     Cervical back: Neck supple.  Skin:  General: Skin is warm.  Neurological:     Mental Status: He is alert and oriented to person, place, and time.  Psychiatric:        Behavior: Behavior normal.        Thought Content: Thought content normal.        Judgment: Judgment normal.     Left Shoulder Exam   Tenderness  The patient is experiencing tenderness in the acromioclavicular joint.  Range of  Motion  Active abduction:  abnormal Left shoulder active abduction: limited by pain. Passive abduction:  normal  External rotation:  normal  Forward flexion:  abnormal Left shoulder forward flexion: limited by pain. Internal rotation 0 degrees:  normal  Internal rotation 90 degrees:  normal   Muscle Strength  Internal rotation: 5/5  External rotation: 5/5  Supraspinatus: 5/5  Subscapularis: 5/5  Biceps: 5/5   Tests  Cross arm: positive       Specialty Comments:  MRI CERVICAL SPINE WITHOUT CONTRAST   TECHNIQUE: Multiplanar, multisequence MR imaging of the cervical spine was performed. No intravenous contrast was administered.   COMPARISON:  Cervical spine radiographs 09/14/2022, cervical spine MRI 06/28/2009 (report only, images not available.   FINDINGS: Alignment: There is a reversal of the normal cervical lordosis. There is no antero or retrolisthesis.   Vertebrae: Vertebral body heights are preserved, without evidence of acute injury. Background marrow signal is normal. There is no suspicious marrow signal abnormality or marrow edema. Postsurgical changes reflecting ACDF from C3 through C5 as well as prior fusion at C5 through C7 are noted.   Cord: There are small foci of signal abnormality in the right and left aspect of the cord at C3-C4 likely reflecting myelomalacia. There is additional signal abnormality in the central cord at T2 on the axial images (5-38). This is incompletely imaged on the axial images but appears to extend to the T3 level on the sagittal STIR sequence (4-8).   Posterior Fossa, vertebral arteries, paraspinal tissues: The imaged posterior fossa is unremarkable. The vertebral artery flow voids are normal. The paraspinal soft tissues are unremarkable.   Disc levels:   C2-C3: There is mild facet arthropathy with ligamentum flavum thickening but no significant spinal canal or neural foraminal stenosis   C3-C4: Status post ACDF without  residual spinal canal or neural foraminal stenosis   C4-C5: Status post ACDF with suspected mild right neural foraminal stenosis due to endplate spurring and facet arthropathy. No significant spinal canal or left neural foraminal stenosis   C5-C6: Prior fusion without residual spinal canal or neural foraminal stenosis   C6-C7: Prior fusion without residual spinal canal or neural foraminal stenosis.   C7-T1: There is a disc bulge, endplate spurring, and bilateral facet arthropathy with ligamentum flavum thickening resulting in moderate spinal canal stenosis and severe bilateral neural foraminal stenosis   T1-T2: There is a broad-based disc protrusion and bilateral facet arthropathy resulting in mild spinal canal stenosis and mild bilateral neural foraminal stenosis.   IMPRESSION: 1. Status post ACDF at C3 through C5 with additional prior fusion at C5 through C7. No significant residual spinal canal or neural foraminal stenosis at the fused levels. 2. 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 3. Mild spinal canal stenosis and mild bilateral neural foraminal stenosis at T1-T2 due to a disc protrusion and bilateral facet arthropathy. 4. Small foci of cord signal abnormality at C3-C4 consistent with myelomalacia. 5. Additional central cord signal abnormality at  T2 is incompletely evaluated but could reflect a small syrinx. Edema or myelomalacia are considered less likely given lack of cord compression. This could be further evaluated with dedicated thoracic spine MRI if indicated.     Electronically Signed   By: Maude Harry M.D.   On: 10/05/2022 08:36  Imaging: XR Shoulder Left Result Date: 12/07/2023 X-rays of the left shoulder show no acute abnormalities.  Arthritic changes in the [facility] and AC joints.     PMFS History: Patient Active Problem List   Diagnosis Date Noted   On long term drug  therapy 10/27/2023   Pure hypercholesterolemia 10/27/2023   Viral URI 06/28/2023   Splinter in skin 03/08/2023   COVID 02/02/2023   Bee sting reaction 01/11/2023   Ulcer of left lower extremity (HCC) 12/16/2022   Bilateral hand numbness 10/06/2022   Atypical pigmented lesion 09/29/2022   ED (erectile dysfunction) 09/29/2022   Fusion of spine of cervical region 09/14/2022   Bilateral third flexor stenosing tenosynovitis and first carpometacarpal osteoarthritis. 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   Hypertension associated with diabetes (HCC) 07/13/2017   Benign essential tremor 07/13/2017   Kidney cyst, acquired 01/24/2017   Seborrheic keratoses 04/01/2016   Diverticulosis of colon without hemorrhage 02/26/2016   Anxiety state 10/02/2015   BPH (benign prostatic hyperplasia) 07/31/2015   Carpal tunnel syndrome, left upper limb 06/04/2015   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 Normal cors 2001. b. Neg stress test 2012, 05/2014.   Diabetes mellitus without complication (HCC)    GERD (gastroesophageal reflux disease)    Hyperlipidemia    Hypertension    Hypertensive response to exercise    Hypothyroidism    Obesity    Obstructive sleep  apnea    Currently untreated    PONV (postoperative nausea and vomiting)    Sleep apnea    Ulcer of left lower extremity (HCC) 12/16/2022    Family History  Problem Relation Age of Onset   Diabetes Mother    Heart disease Mother    Hypertension Mother    Stroke Mother    Heart attack Mother    COPD Mother    Cancer Father    Heart disease Father    Diabetes Father    Hyperlipidemia Sister    Hypertension Sister    Diabetes Maternal Grandmother    Stroke Maternal Grandmother    Heart attack Maternal Grandmother    Heart disease Maternal Grandmother    Diabetes Maternal Grandfather    Hypertension Maternal Grandfather    Heart attack Maternal Grandfather    Heart disease Maternal Grandfather    Diabetes Sister    Hypertension Sister    Cancer Sister    Hyperlipidemia Sister    Heart attack Paternal Grandmother    Heart attack Paternal Grandfather  Cancer Sister    Hyperlipidemia Sister    COPD Maternal Aunt    Hypertension Maternal Aunt    Diabetes Son    Diabetes Maternal Aunt     Past Surgical History:  Procedure Laterality Date   CARDIAC CATHETERIZATION     CERVICAL FUSION     HERNIA REPAIR     KNEE ARTHROSCOPY  1984   both knee   [facility]ROS[facility]   SHOULDER ARTHROSCOPY WITH SUBACROMIAL DECOMPRESSION Right 09/13/2019   Procedure: RIGHT SHOULDER ARTHROSCOPY WITH EXTENSIVE DEBRIDEMENT, SUBACROMIAL DECOMPRESSION,;  Surgeon: Vernetta Lonni GRADE, MD;  Location: [facility];  Service: Orthopedics;  Laterality: Right;   SPINE SURGERY  1st Sept 1977   2nd Feb 2011   Social History   Occupational History    Comment: Retired  Tobacco Use   Smoking status: Former    Current packs/day: 0.00    Average packs/day: 1.5 packs/day for 30.0 years (45.0 ttl pk-yrs)    Types: Cigarettes    Start date: 06/01/1963    Quit date: 05/31/1993    Years since quitting: 30.5   Smokeless tobacco: Never   Tobacco comments:    24 years smoke free  and alcohol free  Vaping Use   Vaping status: Never Used  Substance and Sexual Activity   Alcohol use: No   Drug use: No   Sexual activity: Yes    Birth control/protection: None    Comment: Not Necessary

## 2023-12-07 NOTE — Telephone Encounter (Signed)
 Patient came into office to drop off pre-Opertsive Consultation form to be completed by Pcp, Form placed in Dr. Alvia box, thanks.

## 2023-12-07 NOTE — Telephone Encounter (Signed)
 SU Clearance form (hard copy) given to patient to take to his PCP. PCP will fax back to us .

## 2023-12-21 ENCOUNTER — Encounter: Payer: Self-pay | Admitting: Orthopaedic Surgery

## 2023-12-26 ENCOUNTER — Other Ambulatory Visit: Payer: Self-pay | Admitting: Physician Assistant

## 2023-12-26 MED ORDER — ONDANSETRON HCL 4 MG PO TABS: 4.0000 mg | ORAL_TABLET | Freq: Three times a day (TID) | ORAL | 0 refills | Status: AC | PRN

## 2023-12-26 MED ORDER — HYDROCODONE-ACETAMINOPHEN 5-325 MG PO TABS: 1.0000 | ORAL_TABLET | Freq: Three times a day (TID) | ORAL | 0 refills | Status: DC | PRN

## 2023-12-29 ENCOUNTER — Encounter (HOSPITAL_BASED_OUTPATIENT_CLINIC_OR_DEPARTMENT_OTHER): Payer: Self-pay | Admitting: Orthopaedic Surgery

## 2023-12-29 ENCOUNTER — Encounter: Payer: Self-pay | Admitting: Family Medicine

## 2023-12-29 ENCOUNTER — Encounter (HOSPITAL_BASED_OUTPATIENT_CLINIC_OR_DEPARTMENT_OTHER)
Admission: RE | Admit: 2023-12-29 | Discharge: 2023-12-29 | Disposition: A | Discharge status: Home / Self Care | Source: Ambulatory Visit | Attending: Orthopaedic Surgery | Admitting: Orthopaedic Surgery

## 2023-12-29 DIAGNOSIS — Z01818 Encounter for other preprocedural examination: Secondary | ICD-10-CM | POA: Diagnosis not present

## 2023-12-29 DIAGNOSIS — E11319 Type 2 diabetes mellitus with unspecified diabetic retinopathy without macular edema: Secondary | ICD-10-CM | POA: Insufficient documentation

## 2023-12-29 LAB — BASIC METABOLIC PANEL WITH GFR
Anion gap: 8 (ref 5–15)
BUN: 7 mg/dL — ABNORMAL LOW (ref 8–23)
CO2: 25 mmol/L (ref 22–32)
Calcium: 8.8 mg/dL — ABNORMAL LOW (ref 8.9–10.3)
Chloride: 107 mmol/L (ref 98–111)
Creatinine, Ser: 0.84 mg/dL (ref 0.61–1.24)
GFR, Estimated: >60 mL/min (ref >60)
Glucose, Bld: 160 mg/dL — ABNORMAL HIGH (ref 70–99)
Potassium: 4.2 mmol/L (ref 3.5–5.1)
Sodium: 140 mmol/L (ref 135–145)

## 2024-01-04 ENCOUNTER — Ambulatory Visit (HOSPITAL_BASED_OUTPATIENT_CLINIC_OR_DEPARTMENT_OTHER): Admitting: Anesthesiology

## 2024-01-04 ENCOUNTER — Other Ambulatory Visit: Payer: Self-pay

## 2024-01-04 ENCOUNTER — Encounter (HOSPITAL_BASED_OUTPATIENT_CLINIC_OR_DEPARTMENT_OTHER)
Admission: RE | Disposition: A | Discharge status: Home / Self Care | Payer: Self-pay | Source: Home / Self Care | Attending: Orthopaedic Surgery

## 2024-01-04 ENCOUNTER — Ambulatory Visit (HOSPITAL_BASED_OUTPATIENT_CLINIC_OR_DEPARTMENT_OTHER)
Admission: RE | Admit: 2024-01-04 | Discharge: 2024-01-04 | Disposition: A | Discharge status: Home / Self Care | Attending: Orthopaedic Surgery | Admitting: Orthopaedic Surgery

## 2024-01-04 ENCOUNTER — Encounter (HOSPITAL_BASED_OUTPATIENT_CLINIC_OR_DEPARTMENT_OTHER): Payer: Self-pay | Admitting: Orthopaedic Surgery

## 2024-01-04 ENCOUNTER — Encounter: Payer: Self-pay | Admitting: Orthopaedic Surgery

## 2024-01-04 DIAGNOSIS — Z87891 Personal history of nicotine dependence: Secondary | ICD-10-CM | POA: Insufficient documentation

## 2024-01-04 DIAGNOSIS — M7542 Impingement syndrome of left shoulder: Secondary | ICD-10-CM

## 2024-01-04 DIAGNOSIS — M25812 Other specified joint disorders, left shoulder: Secondary | ICD-10-CM | POA: Insufficient documentation

## 2024-01-04 DIAGNOSIS — I10 Essential (primary) hypertension: Secondary | ICD-10-CM | POA: Insufficient documentation

## 2024-01-04 DIAGNOSIS — Z5986 Financial insecurity: Secondary | ICD-10-CM | POA: Insufficient documentation

## 2024-01-04 DIAGNOSIS — Z79890 Hormone replacement therapy (postmenopausal): Secondary | ICD-10-CM | POA: Diagnosis not present

## 2024-01-04 DIAGNOSIS — E119 Type 2 diabetes mellitus without complications: Secondary | ICD-10-CM | POA: Insufficient documentation

## 2024-01-04 DIAGNOSIS — Z01818 Encounter for other preprocedural examination: Secondary | ICD-10-CM

## 2024-01-04 DIAGNOSIS — S43432A Superior glenoid labrum lesion of left shoulder, initial encounter: Secondary | ICD-10-CM

## 2024-01-04 DIAGNOSIS — Z7984 Long term (current) use of oral hypoglycemic drugs: Secondary | ICD-10-CM | POA: Insufficient documentation

## 2024-01-04 DIAGNOSIS — G4733 Obstructive sleep apnea (adult) (pediatric): Secondary | ICD-10-CM | POA: Diagnosis not present

## 2024-01-04 DIAGNOSIS — K219 Gastro-esophageal reflux disease without esophagitis: Secondary | ICD-10-CM | POA: Diagnosis not present

## 2024-01-04 DIAGNOSIS — M19012 Primary osteoarthritis, left shoulder: Secondary | ICD-10-CM | POA: Diagnosis present

## 2024-01-04 DIAGNOSIS — E039 Hypothyroidism, unspecified: Secondary | ICD-10-CM | POA: Insufficient documentation

## 2024-01-04 DIAGNOSIS — M75112 Incomplete rotator cuff tear or rupture of left shoulder, not specified as traumatic: Secondary | ICD-10-CM | POA: Insufficient documentation

## 2024-01-04 HISTORY — PX: POSTERIOR LUMBAR FUSION 2 WITH HARDWARE REMOVAL: SHX7297

## 2024-01-04 LAB — GLUCOSE, CAPILLARY
Glucose-Capillary: 93 mg/dL (ref 70–99)
Glucose-Capillary: 99 mg/dL (ref 70–99)

## 2024-01-04 SURGERY — ARTHROSCOPY, SHOULDER WITH DEBRIDEMENT
Anesthesia: General | Site: Shoulder | Laterality: Left

## 2024-01-04 MED ORDER — BUPIVACAINE HCL (PF) 0.5 % IJ SOLN
INTRAMUSCULAR | Status: DC | PRN
  Administered 2024-01-04: 10 mL

## 2024-01-04 MED ORDER — ROCURONIUM BROMIDE 10 MG/ML (PF) SYRINGE
PREFILLED_SYRINGE | INTRAVENOUS | Status: DC | PRN
  Administered 2024-01-04: 80 mg via INTRAVENOUS

## 2024-01-04 MED ORDER — DEXAMETHASONE SODIUM PHOSPHATE 10 MG/ML IJ SOLN
INTRAMUSCULAR | Status: DC | PRN
  Administered 2024-01-04: 5 mg via INTRAVENOUS

## 2024-01-04 MED ORDER — FENTANYL CITRATE (PF) 100 MCG/2ML IJ SOLN
INTRAMUSCULAR | Status: DC | PRN
  Administered 2024-01-04: 50 ug via INTRAVENOUS

## 2024-01-04 MED ORDER — FENTANYL CITRATE (PF) 100 MCG/2ML IJ SOLN
50.0000 ug | Freq: Once | INTRAMUSCULAR | Status: AC
  Administered 2024-01-04: 50 ug via INTRAVENOUS

## 2024-01-04 MED ORDER — LACTATED RINGERS IV SOLN: INTRAVENOUS | Status: DC

## 2024-01-04 MED ORDER — SODIUM CHLORIDE 0.9 % IR SOLN
Status: DC | PRN
Start: 2024-01-04 — End: 2024-01-04
  Administered 2024-01-04: 9000 mL

## 2024-01-04 MED ORDER — CEFAZOLIN SODIUM-DEXTROSE 2-4 GM/100ML-% IV SOLN
INTRAVENOUS | Status: AC
  Filled 2024-01-04: qty 100

## 2024-01-04 MED ORDER — MIDAZOLAM HCL 2 MG/2ML IJ SOLN
INTRAMUSCULAR | Status: AC
  Filled 2024-01-04: qty 2

## 2024-01-04 MED ORDER — ONDANSETRON HCL 4 MG/2ML IJ SOLN: 4.0000 mg | Freq: Once | INTRAMUSCULAR | Status: DC | PRN

## 2024-01-04 MED ORDER — PROPOFOL 10 MG/ML IV BOLUS
INTRAVENOUS | Status: DC | PRN
  Administered 2024-01-04: 160 mg via INTRAVENOUS

## 2024-01-04 MED ORDER — FENTANYL CITRATE (PF) 100 MCG/2ML IJ SOLN
INTRAMUSCULAR | Status: AC
Start: 2024-01-04 — End: 2024-01-04
  Filled 2024-01-04: qty 2

## 2024-01-04 MED ORDER — SUGAMMADEX SODIUM 200 MG/2ML IV SOLN
INTRAVENOUS | Status: DC | PRN
Start: 2024-01-04 — End: 2024-01-04
  Administered 2024-01-04: 250 mg via INTRAVENOUS

## 2024-01-04 MED ORDER — CEFAZOLIN SODIUM-DEXTROSE 2-4 GM/100ML-% IV SOLN: 2.0000 g | Freq: Once | INTRAVENOUS | Status: DC

## 2024-01-04 MED ORDER — ACETAMINOPHEN 10 MG/ML IV SOLN: 1000.0000 mg | Freq: Once | INTRAVENOUS | Status: DC | PRN

## 2024-01-04 MED ORDER — FENTANYL CITRATE (PF) 100 MCG/2ML IJ SOLN
INTRAMUSCULAR | Status: AC
  Filled 2024-01-04: qty 2

## 2024-01-04 MED ORDER — BUPIVACAINE LIPOSOME 1.3 % IJ SUSP
INTRAMUSCULAR | Status: DC | PRN
  Administered 2024-01-04: 10 mL

## 2024-01-04 MED ORDER — OXYCODONE HCL 5 MG PO TABS
5.0000 mg | ORAL_TABLET | Freq: Once | ORAL | Status: AC | PRN
  Administered 2024-01-04: 5 mg via ORAL

## 2024-01-04 MED ORDER — OXYCODONE HCL 5 MG PO TABS
ORAL_TABLET | ORAL | Status: AC
  Filled 2024-01-04: qty 1

## 2024-01-04 MED ORDER — MIDAZOLAM HCL 2 MG/2ML IJ SOLN
1.0000 mg | Freq: Once | INTRAMUSCULAR | Status: AC
  Administered 2024-01-04: 1 mg via INTRAVENOUS

## 2024-01-04 MED ORDER — OXYCODONE HCL 5 MG/5ML PO SOLN: 5.0000 mg | Freq: Once | ORAL | Status: AC | PRN

## 2024-01-04 MED ORDER — FENTANYL CITRATE (PF) 100 MCG/2ML IJ SOLN: 25.0000 ug | INTRAMUSCULAR | Status: DC | PRN

## 2024-01-04 MED ORDER — ONDANSETRON HCL 4 MG/2ML IJ SOLN
INTRAMUSCULAR | Status: DC | PRN
  Administered 2024-01-04: 4 mg via INTRAVENOUS

## 2024-01-04 MED ORDER — PHENYLEPHRINE HCL-NACL 20-0.9 MG/250ML-% IV SOLN
INTRAVENOUS | Status: DC | PRN
  Administered 2024-01-04: 50 ug/min via INTRAVENOUS

## 2024-01-04 SURGICAL SUPPLY — 44 items
BLADE EXCALIBUR 4.0X13 (MISCELLANEOUS) IMPLANT
BURR OVAL 8 FLU 4.0X13 (MISCELLANEOUS) ×1 IMPLANT
CANNULA 5.75X71 LONG (CANNULA) ×1 IMPLANT
CANNULA SHOULDER 7CM (CANNULA) IMPLANT
CANNULA TWIST IN 8.25X7CM (CANNULA) IMPLANT
COOLER ICEMAN CLASSIC (MISCELLANEOUS) ×1 IMPLANT
DISSECTOR 3.8MM X 13CM (MISCELLANEOUS) ×1 IMPLANT
DRAPE IMP U-DRAPE 54X76 (DRAPES) ×1 IMPLANT
DRAPE INCISE IOBAN 66X45 STRL (DRAPES) ×1 IMPLANT
DRAPE STERI 35X30 U-POUCH (DRAPES) ×1 IMPLANT
DRAPE TOP ARMCOVERS (MISCELLANEOUS) IMPLANT
DRAPE U-SHAPE 47X51 STRL (DRAPES) ×2 IMPLANT
DRAPE U-SHAPE 76X120 STRL (DRAPES) ×2 IMPLANT
DURAPREP 26ML APPLICATOR (WOUND CARE) ×2 IMPLANT
ELECTRODE REM PT RTRN 9FT ADLT (ELECTROSURGICAL) IMPLANT
GAUZE PAD ABD 8X10 STRL (GAUZE/BANDAGES/DRESSINGS) ×2 IMPLANT
GAUZE SPONGE 4X4 12PLY STRL (GAUZE/BANDAGES/DRESSINGS) ×2 IMPLANT
GAUZE XEROFORM 1X8 LF (GAUZE/BANDAGES/DRESSINGS) ×1 IMPLANT
GLOVE BIOGEL PI IND STRL 7.5 (GLOVE) ×1 IMPLANT
GLOVE ECLIPSE 7.0 STRL STRAW (GLOVE) ×2 IMPLANT
GLOVE INDICATOR 7.0 STRL GRN (GLOVE) ×1 IMPLANT
GLOVE SURG SYN 7.5 PF PI (GLOVE) ×2 IMPLANT
GOWN STRL REUS W/ TWL LRG LVL3 (GOWN DISPOSABLE) ×1 IMPLANT
GOWN STRL SURGICAL XL XLNG (GOWN DISPOSABLE) ×2 IMPLANT
MANIFOLD NEPTUNE II (INSTRUMENTS) ×1 IMPLANT
NDL HD SCORPION MEGA LOADER (NEEDLE) IMPLANT
PACK ARTHROSCOPY DSU (CUSTOM PROCEDURE TRAY) ×1 IMPLANT
PACK BASIN DAY SURGERY FS (CUSTOM PROCEDURE TRAY) ×1 IMPLANT
PAD COLD SHLDR WRAP-ON (PAD) ×1 IMPLANT
SHEET MEDIUM DRAPE 40X70 STRL (DRAPES) ×1 IMPLANT
SLEEVE SCD COMPRESS KNEE MED (STOCKING) ×1 IMPLANT
SLING ARM FOAM STRAP LRG (SOFTGOODS) IMPLANT
SLING ARM FOAM STRAP XLG (SOFTGOODS) IMPLANT
SPIKE FLUID TRANSFER (MISCELLANEOUS) IMPLANT
SUT ETHILON 3 0 PS 1 (SUTURE) ×1 IMPLANT
SUT TIGER TAPE 7 IN WHITE (SUTURE) IMPLANT
SUTURE FIBERWR #2 38 T-5 BLUE (SUTURE) IMPLANT
SUTURE TAPE 1.3 40 TPR END (SUTURE) IMPLANT
SUTURE TAPE TIGERLINK 1.3MM BL (SUTURE) IMPLANT
TAPE FIBER 2MM 7IN #2 BLUE (SUTURE) IMPLANT
TOWEL GREEN STERILE FF (TOWEL DISPOSABLE) ×1 IMPLANT
TUBE CONNECTING 20X1/4 (TUBING) ×1 IMPLANT
TUBING ARTHROSCOPY IRRIG 16FT (MISCELLANEOUS) ×1 IMPLANT
WAND ABLATOR APOLLO I90 (BUR) ×1 IMPLANT

## 2024-01-04 NOTE — Progress Notes (Signed)
 Assisted Dr. Ace Gins with left, interscalene , ultrasound guided block. Side rails up, monitors on throughout procedure. See vital signs in flow sheet. Tolerated Procedure well.

## 2024-01-04 NOTE — Transfer of Care (Signed)
 Immediate Anesthesia Transfer of Care Note  Patient: Gregory DELENA Holt [name]  Procedure(s) Performed: Procedure(s) (LRB): ARTHROSCOPY, SHOULDER WITH DEBRIDEMENT (Left)  Patient Location: PACU  Anesthesia Type: General  Level of Consciousness: awake, oriented, sedated and patient cooperative  Airway & Oxygen Therapy: Patient Spontanous Breathing and Patient connected to face mask oxygen  Post-op Assessment: Report given to PACU RN and Post -op Vital signs reviewed and stable  Post vital signs: Reviewed and stable  Complications: No apparent anesthesia complications Last Vitals:  Vitals Value Taken Time  BP    Temp    Pulse 64 01/04/24 13:41  Resp 14 01/04/24 13:41  SpO2 92 % 01/04/24 13:41    Last Pain:  Vitals:   01/04/24 1036  TempSrc: Temporal  PainSc: 8       Patients Stated Pain Goal: 5 (01/04/24 1036)  Complications: No notable events documented.

## 2024-01-04 NOTE — Anesthesia Postprocedure Evaluation (Signed)
 Anesthesia Post Note  Patient: Gregory DELENA Holt [name]  Procedure(s) Performed: ARTHROSCOPY, SHOULDER WITH DEBRIDEMENT (Left: Shoulder)     Patient location during evaluation: PACU Anesthesia Type: General Level of consciousness: awake and alert Pain management: pain level controlled Vital Signs Assessment: post-procedure vital signs reviewed and stable Respiratory status: spontaneous breathing, nonlabored ventilation, respiratory function stable and patient connected to nasal cannula oxygen Cardiovascular status: blood pressure returned to baseline and stable Postop Assessment: no apparent nausea or vomiting Anesthetic complications: no   No notable events documented.  Last Vitals:  Vitals:   01/04/24 1430 01/04/24 1512  BP: 124/73 124/73  Pulse:    Resp: 16 16  Temp: (!) 36.2 C (!) 36.2 C  SpO2: 95% 96%    Last Pain:  Vitals:   01/04/24 1512  TempSrc:   PainSc: 5                  [name]

## 2024-01-04 NOTE — Anesthesia Preprocedure Evaluation (Addendum)
 Anesthesia Evaluation  Patient identified by MRN, date of birth, ID band Patient awake    Reviewed: Allergy & Precautions, NPO status , Patient's Chart, lab work & pertinent test results, reviewed documented beta blocker date and time   History of Anesthesia Complications (+) PONV and history of anesthetic complications  Airway Mallampati: II  TM Distance: >3 FB     Dental no notable dental hx.    Pulmonary sleep apnea , former smoker   breath sounds clear to auscultation       Cardiovascular hypertension,  Rhythm:Regular Rate:Normal  Normal stress test 2020   Neuro/Psych   Anxiety      Neuromuscular disease    GI/Hepatic ,GERD  ,,  Endo/Other  diabetes, Type 2Hypothyroidism    Renal/GU Renal disease     Musculoskeletal  (+) Arthritis ,    Abdominal   Peds  Hematology   Anesthesia Other Findings   Reproductive/Obstetrics                              Anesthesia Physical Anesthesia Plan  ASA: 3  Anesthesia Plan: General   Post-op Pain Management: Regional block*   Induction: Intravenous  PONV Risk Score and Plan: 2 and Ondansetron  and Dexamethasone   Airway Management Planned: Oral ETT  Additional Equipment:   Intra-op Plan:   Post-operative Plan: Extubation in OR  Informed Consent: I have reviewed the patients History and Physical, chart, labs and discussed the procedure including the risks, benefits and alternatives for the proposed anesthesia with the patient or authorized representative who has indicated his/her understanding and acceptance.     Dental advisory given  Plan Discussed with: CRNA  Anesthesia Plan Comments:         Anesthesia Quick Evaluation

## 2024-01-04 NOTE — H&P (Signed)
 PREOPERATIVE H&P  Chief Complaint: left partial rotator cuff tear, impingement, acromioclavicular arthritis, biceps tendinosis  HPI: Gregory West [name] is a 73 y.o. male who presents for surgical treatment of left partial rotator cuff tear, impingement, acromioclavicular arthritis, biceps tendinosis.  He denies any changes in medical history.  Past Surgical History:  Procedure Laterality Date   CARDIAC CATHETERIZATION     CERVICAL FUSION     HERNIA REPAIR     KNEE ARTHROSCOPY  1984   both knee   [facility]   SHOULDER ARTHROSCOPY WITH SUBACROMIAL DECOMPRESSION Right 09/13/2019   Procedure: RIGHT SHOULDER ARTHROSCOPY WITH EXTENSIVE DEBRIDEMENT, SUBACROMIAL DECOMPRESSION,;  Surgeon: Vernetta Lonni GRADE, MD;  Location: [facility];  Service: Orthopedics;  Laterality: Right;   SPINE SURGERY  1st Sept 1977   2nd Feb 2011   Social History   Socioeconomic History   Marital status: Married    Spouse name: Avelina   Number of children: 2   Years of education: 16   Highest education level: Bachelor's degree (e.g., BA, AB, BS)  Occupational History    Comment: Retired  Tobacco Use   Smoking status: Former    Current packs/day: 0.00    Average packs/day: 1.5 packs/day for 30.0 years (45.0 ttl pk-yrs)    Types: Cigarettes    Start date: 06/01/1963    Quit date: 05/31/1993    Years since quitting: 30.6   Smokeless tobacco: Never   Tobacco comments:    24 years smoke free and alcohol free  Vaping Use   Vaping status: Never Used  Substance and Sexual Activity   Alcohol use: No   Drug use: No   Sexual activity: Yes    Birth control/protection: None    Comment: Not Necessary  Other Topics Concern   Not on file  Social History Narrative   Lives with his wife. He has two children and three grand children. He likes to read his bible and go fishing in his free time.    Social Drivers of Corporate investment banker Strain: Low Risk   (11/15/2023)   Overall Financial Resource Strain (CARDIA)    Difficulty of Paying Living Expenses: Not very hard  Recent Concern: Financial Resource Strain - Medium Risk (10/31/2023)   Received from [facility]   Overall Financial Resource Strain (CARDIA)    Difficulty of Paying Living Expenses: Somewhat hard  Food Insecurity: No Food Insecurity (11/15/2023)   Hunger Vital Sign    Worried About Running Out of Food in the Last Year: Never true    Ran Out of Food in the Last Year: Never true  Transportation Needs: No Transportation Needs (11/15/2023)   PRAPARE - Administrator, Civil Service (Medical): No    Lack of Transportation (Non-Medical): No  Physical Activity: Insufficiently Active (11/15/2023)   Exercise Vital Sign    Days of Exercise per Week: 3 days    Minutes of Exercise per Session: 40 min  Stress: No Stress Concern Present (11/15/2023)   [employer] of Occupational Health - Occupational Stress Questionnaire    Feeling of Stress: Only a little  Social Connections: Socially Integrated (11/15/2023)   Social Connection and Isolation Panel    Frequency of Communication with Friends and Family: Three times a week    Frequency of Social Gatherings with Friends and Family: Twice a week    Attends Religious Services: More than 4 times per year    Active  Member of Clubs or Organizations: Yes    Attends Engineer, structural: Patient declined    Marital Status: Married   Family History  Problem Relation Age of Onset   Diabetes Mother    Heart disease Mother    Hypertension Mother    Stroke Mother    Heart attack Mother    COPD Mother    Cancer Father    Heart disease Father    Diabetes Father    Hyperlipidemia Sister    Hypertension Sister    Diabetes Maternal Grandmother    Stroke Maternal Grandmother    Heart attack Maternal Grandmother    Heart disease Maternal Grandmother    Diabetes Maternal Grandfather    Hypertension Maternal Grandfather     Heart attack Maternal Grandfather    Heart disease Maternal Grandfather    Diabetes Sister    Hypertension Sister    Cancer Sister    Hyperlipidemia Sister    Heart attack Paternal Grandmother    Heart attack Paternal Grandfather    Cancer Sister    Hyperlipidemia Sister    COPD Maternal Aunt    Hypertension Maternal Aunt    Diabetes Son    Diabetes Maternal Aunt    Allergies  Allergen Reactions   Statins     Muscle cramping all over body. Lipitor, Crestor , and pravastatin.   Liraglutide Other (See Comments)    Caused pancreatitis Other reaction(s): pancreas infection   Lisinopril Cough   Prior to Admission medications   Medication Sig Start Date End Date Taking? Authorizing Provider  Alirocumab  (PRALUENT ) 75 MG/ML SOAJ Inject 1 mL (75 mg total) into the skin every 14 (fourteen) days. 10/18/23  Yes Fountain, Madison L, NP  aspirin 81 MG tablet Take 81 mg by mouth daily.   Yes [provider]  carboxymethylcellulose (REFRESH PLUS) 0.5 % SOLN  07/16/20  Yes [provider]  celecoxib  (CELEBREX ) 200 MG capsule One to 2 tablets by mouth daily as needed for pain. 10/30/21  Yes Curtis Debby PARAS, MD  Cholecalciferol (VITAMIN D3) 5000 units CAPS Take 1 capsule by mouth daily.   Yes [provider]  empagliflozin  (JARDIANCE ) 25 MG TABS tablet Take 1 tablet (25 mg total) by mouth daily. 09/03/22  Yes Hobart Powell BRAVO, MD  ezetimibe  (ZETIA ) 10 MG tablet Take 1 tablet (10 mg total) by mouth daily. 09/03/22  Yes Hobart Powell BRAVO, MD  HYDROcodone -acetaminophen  (NORCO/VICODIN) 5-325 MG tablet Take 1 tablet by mouth 3 (three) times daily as needed. To be taken after surgery 12/26/23   Jule Ronal CROME, PA-C  insulin glargine (LANTUS) 100 UNIT/ML injection Inject 30 Units into the skin daily.   Yes [provider]  latanoprost (XALATAN) 0.005 % ophthalmic solution  07/16/20  Yes [provider]  levothyroxine (SYNTHROID) 25 MCG tablet Take 25 mcg  by mouth daily before breakfast.   Yes [provider]  Lifitegrast 5 % SOLN Apply 1 drop to eye 2 (two) times daily. 09/20/23  Yes [provider]  losartan  (COZAAR ) 25 MG tablet Take 1 tablet (25 mg total) by mouth daily. 11/01/23  Yes Fountain, Madison L, NP  metFORMIN  (GLUCOPHAGE -XR) 750 MG 24 hr tablet TAKE 1 TABLET BY MOUTH TWICE A DAY BEFORE MEALS 11/22/23  Yes Alvia Bring, DO  omeprazole  (PRILOSEC) 40 MG capsule TAKE 1 CAPSULE (40 MG TOTAL) BY MOUTH DAILY. 10/26/22  Yes Alvia Bring, DO  ondansetron  (ZOFRAN ) 4 MG tablet Take 1 tablet (4 mg total) by mouth every 8 (  eight) hours as needed for nausea or vomiting. 12/26/23   Jule Ronal CROME, PA-C  propranolol  ER (INDERAL  LA) 60 MG 24 hr capsule TAKE 1 CAPSULE BY MOUTH EVERY DAY 04/22/23  Yes Alvia Bring, DO  rosuvastatin  (CRESTOR ) 5 MG tablet Take 1 tablet (5 mg total) by mouth daily. 09/03/22  Yes Pemberton, Powell BRAVO, MD  Tafluprost, PF, 0.0015 % SOLN Place 1 drop into both eyes every evening. 08/25/23  Yes [provider]  tamsulosin  (FLOMAX ) 0.4 MG CAPS capsule TAKE 1 CAPSULE BY MOUTH EVERY DAY 11/11/23  Yes Alvia Bring, DO  traZODone  (DESYREL ) 50 MG tablet TAKE ONE-HALF (1/2) TO ONE TABLET AT BEDTIME AS NEEDED FOR SLEEP 11/22/23  Yes Alvia Bring, DO  triamcinolone  cream (KENALOG ) 0.1 % Apply 1 Application topically 2 (two) times daily. Apply to legs twice daily for 2 weeks. Alternate every 2 weeks with Tacrolimus  ointment 10/27/23  Yes Alm Delon SAILOR, DO  clotrimazole -betamethasone  (LOTRISONE ) cream Apply 1 application. topically 2 (two) times daily. X 10 days 09/15/21   Alvan Dorothyann BIRCH, MD  Continuous Glucose Sensor MISC by Does not apply route. dexacom    [provider]  cyclobenzaprine (FLEXERIL) 10 MG tablet  10/29/20   [provider]  diphenhydrAMINE -zinc  acetate (BENADRYL ) cream Apply topically 3 (three) times daily as needed for itching. 01/11/23   Bevin Bernice RAMAN, DO  Semaglutide,  2 MG/DOSE, (OZEMPIC, 2 MG/DOSE,) 8 MG/3ML SOPN Inject 2 mg into the skin once a week.    [provider]  tacrolimus  (PROTOPIC ) 0.1 % ointment Apply topically daily. Alternate with Triamcinolone  every 2 weeks. 07/11/23   Alm Delon SAILOR, DO     Positive ROS: All other systems have been reviewed and were otherwise negative with the exception of those mentioned in the HPI and as above.  Physical Exam: General: Alert, no acute distress Cardiovascular: No pedal edema Respiratory: No cyanosis, no use of accessory musculature GI: abdomen soft Skin: No lesions in the area of chief complaint Neurologic: Sensation intact distally Psychiatric: Patient is competent for consent with normal mood and affect Lymphatic: no lymphedema  MUSCULOSKELETAL: exam stable  Assessment: left partial rotator cuff tear, impingement, acromioclavicular arthritis, biceps tendinosis  Plan: Plan for Procedure(s): ARTHROSCOPY, SHOULDER WITH DEBRIDEMENT  The risks benefits and alternatives were discussed with the patient including but not limited to the risks of nonoperative treatment, versus surgical intervention including infection, bleeding, nerve injury,  blood clots, cardiopulmonary complications, morbidity, mortality, among others, and they were willing to proceed.   Ozell Cummins, MD 01/04/2024 10:39 AM

## 2024-01-04 NOTE — Op Note (Signed)
   Date of Surgery: 01/04/2024  INDICATIONS: The patient is a 73 year old male with left shoulder pain that has failed conservative treatment;  The patient did consent to the procedure after discussion of the risks and benefits.  DIAGNOSES: Left shoulder, subacromial impingement and rotator cuff tendinopathy, degenerative labral tear, severe biceps tendinopathy, acromioclavicular arthritis.  POST-OPERATIVE DIAGNOSIS: same  PROCEDURE: Arthroscopic extensive debridement - 29823 Subdeltoid Bursa, Supraspinatus Tendon, Anterior Labrum, Superior Labrum, Posterior Labrum, and infraspinatus, subscapularis, bicipital stump Arthroscopic distal clavicle excision - 70175 Arthroscopic subacromial decompression - 70173   OPERATIVE FINDING: Exam under anesthesia: Normal Articular space: Normal Chondral surfaces: Normal Biceps: Ruptured Subscapularis: Moderate tendinopathy Supraspinatus: Moderate tendinopathy Infraspinatus: Moderate tendinopathy  SURGEON: N. Ozell Cummins, M.D.  ASSIST: Ronal Morna Grave, PA-C  ANESTHESIA:  general, regional  IV FLUIDS AND URINE: See anesthesia.  ESTIMATED BLOOD LOSS: minimal mL.  IMPLANTS: * No implants in log *  COMPLICATIONS: None.  DESCRIPTION OF PROCEDURE: The patient was brought to the operating room and placed supine on the operating table.  The patient had been signed prior to the procedure and this was documented. The patient had the anesthesia placed by the anesthesiologist.  A time-out was performed to confirm that this was the correct patient, site, side and location. The patient did receive antibiotics prior to the incision and was re-dosed during the procedure as needed at indicated intervals.  The patient was then positioned into the beach chair position with all bony prominences well padded and relaxed neck. The patient had the operative extremity prepped and draped in the standard surgical fashion.  Bony landmarks were palpated and marked on  the skin.  A posterior shoulder arthroscopy portal was created followed by an anterior portal through the rotator interval using spinal needle localization.  Diagnostic arthroscopy ensued. Erythema throughout the shoulder joint was encountered.  The biceps tendon had been ruptured.  The remaining stump was debrided back to stable margins.  The labrum was circumferentially degenerative really torn.  The chondral surfaces were unremarkable.  The articular surface of the superior cuff was unremarkable.  Motorized shaver was taken to the labrum and the bicipital stump debrided back to stable margins.  The arthroscope was repositioned into the subacromial space.  He had extensive subdeltoid and subacromial bursitis.  This was quite adherent to the underlying cuff.  Bursectomy was performed with a motorized shaver and ArthroCare wand.  Unfavorable acromial anatomy predisposing to impingement.  Acromioplasty and CA ligament release was performed with a bur and ArthroCare wand.  The bursal surface of the cuff showed moderate tendinopathy.  Motorized shaver was used to debride back to stable margins.  The [facility] joint was severely arthritic and large inferior osteophytes.  Distal clavicle excision was performed removing approximately 5 mm. Excess fluid was expressed from the shoulder.  Incisions closed with nylon.  Sterile dressings were applied.  Shoulder sling was placed.  Patient tolerated the procedure well had no any complications. Morna Grave, my PA, was a medical necessity for the entirety of the surgery including opening, closing, limb positioning, retracting, exposing, and repairing.  POSTOPERATIVE PLAN: Patient will be discharged home.  Follow-up in 1 week for suture removal and initiation of physical therapy.  GEANNIE Ozell Cummins, MD [facility] 1:30 PM

## 2024-01-04 NOTE — Discharge Instructions (Addendum)
 Post-operative patient instructions  Shoulder Arthroscopy   Ice:  Place intermittent ice or cooler pack over your shoulder, 30 minutes on and 30 minutes off.  Continue this for the first 72 hours after surgery, then save ice for use after therapy sessions or on more active days.   Weight:  You may bear weight on your arm as your symptoms allow. Motion:  Perform gentle shoulder motion as tolerated Dressing:  Perform 1st dressing change at 2 days postoperative. A moderate amount of blood tinged drainage is to be expected.  So if you bleed through the dressing on the first or second day or if you have fevers, it is fine to change the dressing/check the wounds early and redress wound.  If it bleeds through again, or if the incisions are leaking frank blood, please call the office. May change dressing every 1-2 days thereafter to help watch wounds. You may place regular band-aids over the incisions.   Shower:  Light shower is ok after 2 days.  Please take shower, NO bath. Recover with gauze and ace wrap to help keep wounds protected.   Pain medication:  A narcotic pain medication has been prescribed.  Take as directed.  Typically you need narcotic pain medication more regularly during the first 3 to 5 days after surgery.  Decrease your use of the medication as the pain improves.  Narcotics can sometimes cause constipation, even after a few doses.  If you have problems with constipation, you can take an over the counter stool softener or light laxative.  If you have persistent problems, please notify your physician's office. Physical therapy: Additional activity guidelines to be provided by your physician or physical therapist at follow-up visits.  Driving: Do not recommend driving x 2 weeks post surgical, especially if surgery performed on right side. Should not drive while taking narcotic pain medications. It typically takes at least 2 weeks to restore sufficient neuromuscular function for normal  reaction times for driving safety.  Call [phone] for questions or problems. Evenings you will be forwarded to the hospital operator.  Ask for the orthopaedic physician on call. Please call if you experience:    Redness, foul smelling, or persistent drainage from the surgical site  worsening shoulder pain and swelling not responsive to medication  any calf pain and or swelling of the lower leg  temperatures greater than 101.5 F other questions or concerns  Per [facility] clinic policy, our goal is ensure optimal postoperative pain control with a multimodal pain management strategy. For all [facility] patients, our goal is to wean post-operative narcotic medications by 6 weeks post-operatively. If this is not possible due to utilization of pain medication prior to surgery, your [facility] doctor will support your acute post-operative pain control for the first 6 weeks postoperatively, with a plan to transition you back to your primary pain team following that. Maralee will work to ensure a Therapist, occupational.  Thank you for allowing us  to be a part of your care.   Regional Anesthesia Blocks  1. You may not be able to move or feel the blocked extremity after a regional anesthetic block. This may last may last from 3-48 hours after placement, but it will go away. The length of time depends on the medication injected and your individual response to the medication. As the nerves start to wake up, you may experience tingling as the movement and feeling returns to your extremity. If the numbness and inability to move your extremity has not  gone away after 48 hours, please call your surgeon.   2. The extremity that is blocked will need to be protected until the numbness is gone and the strength has returned. Because you cannot feel it, you will need to take extra care to avoid injury. Because it may be weak, you may have difficulty moving it or using it. You may not know what position it is in without  looking at it while the block is in effect.  3. For blocks in the legs and feet, returning to weight bearing and walking needs to be done carefully. You will need to wait until the numbness is entirely gone and the strength has returned. You should be able to move your leg and foot normally before you try and bear weight or walk. You will need someone to be with you when you first try to ensure you do not fall and possibly risk injury.  4. Bruising and tenderness at the needle site are common side effects and will resolve in a few days.  5. Persistent numbness or new problems with movement should be communicated to the surgeon or the [facility] [phone] [facility] [phone]).Information for Discharge Teaching: EXPAREL  (bupivacaine  liposome injectable suspension)   Pain relief is important to your recovery. The goal is to control your pain so you can move easier and return to your normal activities as soon as possible after your procedure. Your physician may use several types of medicines to manage pain, swelling, and more.  Your surgeon or anesthesiologist gave you EXPAREL (bupivacaine ) to help control your pain after surgery.  EXPAREL  is a local anesthetic designed to release slowly over an extended period of time to provide pain relief by numbing the tissue around the surgical site. EXPAREL  is designed to release pain medication over time and can control pain for up to 72 hours. Depending on how you respond to EXPAREL , you may require less pain medication during your recovery. EXPAREL  can help reduce or eliminate the need for opioids during the first few days after surgery when pain relief is needed the most. EXPAREL  is not an opioid and is not addictive. It does not cause sleepiness or sedation.   Important! A teal colored band has been placed on your arm with the date, time and amount of EXPAREL  you have received. Please leave this armband in place for the  full 96 hours following administration, and then you may remove the band. If you return to the hospital for any reason within 96 hours following the administration of EXPAREL , the armband provides important information that your health care providers to know, and alerts them that you have received this anesthetic.    Possible side effects of EXPAREL : Temporary loss of sensation or ability to move in the area where medication was injected. Nausea, vomiting, constipation Rarely, numbness and tingling in your mouth or lips, lightheadedness, or anxiety may occur. Call your doctor right away if you think you may be experiencing any of these sensations, or if you have other questions regarding possible side effects.  Follow all other discharge instructions given to you by your surgeon or nurse. Eat a healthy diet and drink plenty of water or other fluids.

## 2024-01-04 NOTE — Anesthesia Procedure Notes (Signed)
 Anesthesia Regional Block: Interscalene brachial plexus block   Pre-Anesthetic Checklist: , timeout performed,  Correct Patient, Correct Site, Correct Laterality,  Correct Procedure, Correct Position, site marked,  Risks and benefits discussed,  Surgical consent,  Pre-op evaluation,  At surgeon's request and post-op pain management  Laterality: Left  Prep: Maximum Sterile Barrier Precautions used, chloraprep       Needles:  Injection technique: Single-shot  Needle Type: Echogenic Needle      Needle Gauge: 20     Additional Needles:   Procedures:,,,, ultrasound used (permanent image in chart),,    Narrative:  Start time: 01/04/2024 11:55 AM End time: 01/04/2024 12:00 PM Injection made incrementally with aspirations every 5 mL.  Performed by: Personally  Anesthesiologist: Keneth Lynwood POUR, MD

## 2024-01-04 NOTE — Anesthesia Procedure Notes (Addendum)
 Procedure Name: Intubation Date/Time: 01/04/2024 12:15 PM  Performed by: Delayne Olam BIRCH, CRNAPre-anesthesia Checklist: Patient identified, Emergency Drugs available, Suction available and Patient being monitored Patient Re-evaluated:Patient Re-evaluated prior to induction Oxygen Delivery Method: Circle system utilized Preoxygenation: Pre-oxygenation with 100% oxygen Induction Type: IV induction Ventilation: Mask ventilation without difficulty Laryngoscope Size: Glidescope, Mac and 3 Grade View: Grade I Tube type: Oral Tube size: 7.5 mm Number of attempts: 1 Airway Equipment and Method: Stylet and Oral airway Placement Confirmation: ETT inserted through vocal cords under direct vision, positive ETCO2 and breath sounds checked- equal and bilateral Secured at: 22 cm Tube secured with: Tape Dental Injury: Teeth and Oropharynx as per pre-operative assessment

## 2024-01-05 ENCOUNTER — Encounter (HOSPITAL_BASED_OUTPATIENT_CLINIC_OR_DEPARTMENT_OTHER): Payer: Self-pay | Admitting: Orthopaedic Surgery

## 2024-01-05 ENCOUNTER — Other Ambulatory Visit: Payer: Self-pay | Admitting: Physician Assistant

## 2024-01-05 ENCOUNTER — Telehealth: Payer: Self-pay | Admitting: Orthopaedic Surgery

## 2024-01-05 MED ORDER — METHOCARBAMOL 500 MG PO TABS: 500.0000 mg | ORAL_TABLET | Freq: Three times a day (TID) | ORAL | 1 refills | Status: DC | PRN

## 2024-01-05 NOTE — Telephone Encounter (Signed)
 Sent in muscle relaxer.  He can also add nsaids if tolerates these and no contraindication

## 2024-01-05 NOTE — Telephone Encounter (Signed)
 Patient called and said that he isn't getting rest on the Oxycodone . Can you please provide something so he can rest. [phone]

## 2024-01-05 NOTE — Telephone Encounter (Signed)
 Called and let patient know

## 2024-01-11 ENCOUNTER — Encounter: Admitting: Orthopedic Surgery

## 2024-01-11 ENCOUNTER — Ambulatory Visit (INDEPENDENT_AMBULATORY_CARE_PROVIDER_SITE_OTHER): Admitting: Orthopaedic Surgery

## 2024-01-11 ENCOUNTER — Encounter: Payer: Self-pay | Admitting: Orthopaedic Surgery

## 2024-01-11 DIAGNOSIS — G8929 Other chronic pain: Secondary | ICD-10-CM

## 2024-01-11 DIAGNOSIS — M7542 Impingement syndrome of left shoulder: Secondary | ICD-10-CM

## 2024-01-11 DIAGNOSIS — M25512 Pain in left shoulder: Secondary | ICD-10-CM

## 2024-01-11 NOTE — Progress Notes (Signed)
 Post-Op Visit Note   Patient: Gregory PARISH [name]           Date of Birth: 02/06/1951           MRN: [id number] Visit Date: 01/11/2024 PCP: Alvia Bring, DO   Assessment & Plan:  Chief Complaint:  Chief Complaint  Patient presents with   Left Shoulder - Follow-up    Left shoulder scope 01/04/2024   Visit Diagnoses:  1. Chronic left shoulder pain   2. Impingement syndrome of left shoulder     Plan: History of Present Illness Gregory DELENA Holt [name] Dick is a 73 year old male who presents for a follow-up visit after left shoulder arthroscopy.  He underwent left shoulder arthroscopy one week ago.    Assessment and Plan 1 week status post left shoulder arthroscopy Scope pictures reviewed with patient and wife. - Refer to physical therapy at [facility] to improve shoulder mobility. - Advise activities within pain limits. - Allow driving and lifting based on comfort and pain levels. - sutures removed, steris applied, remove in 1 week  Follow-Up Instructions: Return in about 4 weeks (around 02/08/2024) for with lindsey.   Orders:  Orders Placed This Encounter  Procedures   Ambulatory referral to Physical Therapy   No orders of the defined types were placed in this encounter.   Imaging: No results found.  PMFS History: Patient Active Problem List   Diagnosis Date Noted   Arthritis of left acromioclavicular joint 01/04/2024   Diabetic retinopathy (HCC) 12/29/2023   On long term drug therapy 10/27/2023   Pure hypercholesterolemia 10/27/2023   Viral URI 06/28/2023   Splinter in skin 03/08/2023   COVID 02/02/2023   Bee sting reaction 01/11/2023   Ulcer of left lower extremity (HCC) 12/16/2022   Bilateral hand numbness 10/06/2022   Atypical pigmented lesion 09/29/2022   ED (erectile dysfunction) 09/29/2022   Fusion of spine of cervical region 09/14/2022   Bilateral third flexor stenosing tenosynovitis and first carpometacarpal osteoarthritis.  10/30/2021   Exercise counseling 07/29/2021   Other specified counseling 07/29/2021   Primary open-angle glaucoma, bilateral, mild stage 07/29/2021   Diabetic neuropathy (HCC) 07/31/2020   Abdominal aortic aneurysm (HCC) 07/31/2020   Insomnia 07/31/2020   GERD (gastroesophageal reflux disease) 01/09/2020   Impingement syndrome of left shoulder 08/02/2019   Interstitial pulmonary disease (HCC) 06/29/2018   Hypertension associated with diabetes (HCC) 07/13/2017   Benign essential tremor 07/13/2017   Kidney cyst, acquired 01/24/2017   Seborrheic keratoses 04/01/2016   Diverticulosis of colon without hemorrhage 02/26/2016   Anxiety state 10/02/2015   BPH (benign prostatic hyperplasia) 07/31/2015   Carpal tunnel syndrome, left upper limb 06/04/2015   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 Normal cors 2001. b. Neg stress test 2012, 05/2014.   Diabetes mellitus without complication (HCC)    GERD (gastroesophageal reflux disease)    Hyperlipidemia    Hypertension    Hypertensive response to exercise    Hypothyroidism    Obesity    Obstructive sleep apnea    Currently untreated    PONV (postoperative nausea and vomiting)  Sleep apnea    +cpap   Ulcer of left lower extremity (HCC) 12/16/2022    Family History  Problem Relation Age of Onset   Diabetes Mother    Heart disease Mother    Hypertension Mother    Stroke Mother    Heart attack Mother    COPD Mother    Cancer Father    Heart disease Father    Diabetes Father    Hyperlipidemia Sister    Hypertension Sister    Diabetes Maternal Grandmother     Stroke Maternal Grandmother    Heart attack Maternal Grandmother    Heart disease Maternal Grandmother    Diabetes Maternal Grandfather    Hypertension Maternal Grandfather    Heart attack Maternal Grandfather    Heart disease Maternal Grandfather    Diabetes Sister    Hypertension Sister    Cancer Sister    Hyperlipidemia Sister    Heart attack Paternal Grandmother    Heart attack Paternal Grandfather    Cancer Sister    Hyperlipidemia Sister    COPD Maternal Aunt    Hypertension Maternal Aunt    Diabetes Son    Diabetes Maternal Aunt     Past Surgical History:  Procedure Laterality Date   CARDIAC CATHETERIZATION     CERVICAL FUSION     HERNIA REPAIR     KNEE ARTHROSCOPY  1984   both knee   [facility]   POSTERIOR LUMBAR FUSION 2 WITH HARDWARE REMOVAL Left 01/04/2024   Procedure: ARTHROSCOPY, SHOULDER WITH DEBRIDEMENT;  Surgeon: Jerri Kay HERO, MD;  Location: [facility];  Service: Orthopedics;  Laterality: Left;  LEFT SHOULDER ARTHROSCOPY, EXTENSIVE DEBRIDEMENT, DISTAL CLAVICLE EXCISION, SUBACROMIAL DECOMPRESSION   SHOULDER ARTHROSCOPY WITH SUBACROMIAL DECOMPRESSION Right 09/13/2019   Procedure: RIGHT SHOULDER ARTHROSCOPY WITH EXTENSIVE DEBRIDEMENT, SUBACROMIAL DECOMPRESSION,;  Surgeon: Vernetta Lonni GRADE, MD;  Location: [facility];  Service: Orthopedics;  Laterality: Right;   SPINE SURGERY  1st Sept 1977   2nd Feb 2011   Social History   Occupational History    Comment: Retired  Tobacco Use   Smoking status: Former    Current packs/day: 0.00    Average packs/day: 1.5 packs/day for 30.0 years (45.0 ttl pk-yrs)    Types: Cigarettes    Start date: 06/01/1963    Quit date: 05/31/1993    Years since quitting: 30.6   Smokeless tobacco: Never   Tobacco comments:    24 years smoke free and alcohol free  Vaping Use   Vaping status: Never Used  Substance and Sexual Activity   Alcohol use: No   Drug use: No   Sexual  activity: Yes    Birth control/protection: None    Comment: Not Necessary

## 2024-01-16 NOTE — Therapy (Signed)
 OUTPATIENT PHYSICAL THERAPY SHOULDER EVALUATION   Patient Name: Gregory FERBER [name] MRN: [id number] DOB:08/25/1950, 73 y.o., male Today's Date: 01/17/2024  END OF SESSION:  PT End of Session - 01/17/24 1017     Visit Number 1    Number of Visits 17    Date for PT Re-Evaluation 03/13/24    [facility] Type [facility]    Authorization - Visit Number 1    Authorization - Number of Visits 15    Progress Note Due on Visit 10    PT Start Time 1017    PT Stop Time 1102    PT Time Calculation (min) 45 min    Activity Tolerance Patient tolerated treatment well          Past Medical History:  Diagnosis Date   Allergy not sure   Arthritis    Chest pain    a. Normal cors 2001. b. Neg stress test 2012, 05/2014.   Diabetes mellitus without complication (HCC)    GERD (gastroesophageal reflux disease)    Hyperlipidemia    Hypertension    Hypertensive response to exercise    Hypothyroidism    Obesity    Obstructive sleep apnea    Currently untreated    PONV (postoperative nausea and vomiting)    Sleep apnea    +cpap   Ulcer of left lower extremity (HCC) 12/16/2022   Past Surgical History:  Procedure Laterality Date   CARDIAC CATHETERIZATION     CERVICAL FUSION     HERNIA REPAIR     KNEE ARTHROSCOPY  1984   both knee   [facility]   POSTERIOR LUMBAR FUSION 2 WITH HARDWARE REMOVAL Left 01/04/2024   Procedure: ARTHROSCOPY, SHOULDER WITH DEBRIDEMENT;  Surgeon: Jerri Kay HERO, MD;  Location: [facility];  Service: Orthopedics;  Laterality: Left;  LEFT SHOULDER ARTHROSCOPY, EXTENSIVE DEBRIDEMENT, DISTAL CLAVICLE EXCISION, SUBACROMIAL DECOMPRESSION   SHOULDER ARTHROSCOPY WITH SUBACROMIAL DECOMPRESSION Right 09/13/2019   Procedure: RIGHT SHOULDER ARTHROSCOPY WITH EXTENSIVE DEBRIDEMENT, SUBACROMIAL DECOMPRESSION,;  Surgeon: Vernetta Lonni GRADE, MD;  Location: [facility];  Service: Orthopedics;  Laterality: Right;   SPINE SURGERY   1st Sept 1977   2nd Feb 2011   Patient Active Problem List   Diagnosis Date Noted   Arthritis of left acromioclavicular joint 01/04/2024   Diabetic retinopathy (HCC) 12/29/2023   On long term drug therapy 10/27/2023   Pure hypercholesterolemia 10/27/2023   Viral URI 06/28/2023   Splinter in skin 03/08/2023   COVID 02/02/2023   Bee sting reaction 01/11/2023   Ulcer of left lower extremity (HCC) 12/16/2022   Bilateral hand numbness 10/06/2022   Atypical pigmented lesion 09/29/2022   ED (erectile dysfunction) 09/29/2022   Fusion of spine of cervical region 09/14/2022   Bilateral third flexor stenosing tenosynovitis and first carpometacarpal osteoarthritis. 10/30/2021   Exercise counseling 07/29/2021   Other specified counseling 07/29/2021   Primary open-angle glaucoma, bilateral, mild stage 07/29/2021   Diabetic neuropathy (HCC) 07/31/2020   Abdominal aortic aneurysm (HCC) 07/31/2020   Insomnia 07/31/2020   GERD (gastroesophageal reflux disease) 01/09/2020   Impingement syndrome of left shoulder 08/02/2019   Interstitial pulmonary disease (HCC) 06/29/2018   Hypertension associated with diabetes (HCC) 07/13/2017   Benign essential tremor 07/13/2017   Kidney cyst, acquired 01/24/2017   Seborrheic keratoses 04/01/2016   Diverticulosis of colon without hemorrhage 02/26/2016   Anxiety state 10/02/2015   BPH (benign prostatic hyperplasia) 07/31/2015   Carpal tunnel syndrome, left upper limb 06/04/2015  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   Vitamin D  deficiency 03/06/2015   History of pancreatitis 03/06/2015   Lumbar spondylosis 03/04/2015   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   Obstructive sleep apnea 05/15/2014   Hyperlipidemia 05/15/2014   Glaucoma suspect 10/21/2011    PCP: Alvia Bring, DO  REFERRING PROVIDER: Jerri Kay HERO, MD  REFERRING DIAG: [phone] (ICD-10-CM) - Chronic left shoulder pain M75.42 (ICD-10-CM) -  Impingement syndrome of left shoulder  THERAPY DIAG:  Left shoulder pain, unspecified chronicity  Muscle weakness (generalized)  Localized edema  Rationale for Evaluation and Treatment: Rehabilitation  ONSET DATE: scope + DCE/SAD 01/04/24 L shoulder  SUBJECTIVE:                                                                                                                                                                                      SUBJECTIVE STATEMENT: Pt endorses chronic history with shoulder injuries, very active in sports, athletics, and [employer]. Worsening over past year prior to surgery. States since surgery pain has been steadily improving, is having difficulty w/ self care tasks, household tasks. Not performing heavier tasks around yard or home. Reports a bit of swelling in shoulder as expected, and bruising that is improving. States surgical follow up went well, reports being advised sling use PRN for comfort and no lifting 5# or greater. States yesterday he did remove one of his steri strips and it had some mild bleeding initially but no issues since. Denies constitutional symptoms (fevers, chills, SOB, etc). Has a bit of tingling in L lateral palm which he states is chronic, unchanged since surgery. Hand dominance: Right  PERTINENT HISTORY: DM, hx chest pain, cervical fusion, lumbar fusion (pt denies), HTN, hypothyroidism, LLE ulcer, R shoulder scope 2021, DM2, essential tremor, diabetic retinopathy Self reports aortic aneurysm, describes as stable and follows with cardiology  PAIN:  Are you having pain: 5/10 Location/description: pinching in front of shoulder, posterior shoulder Best-worst over past week: 5-11.9/10  - aggravating factors: difficulty positioning while sitting, use of arm, picking up pots/pans, opening jar - Easing factors: sling use, icing, posiitoning    PRECAUTIONS: s/p shoulder scope, SAD, DCE (no formal restrictions on referral).   RED FLAGS: No  fevers/chills, no SOB or other constitutional symptoms  WEIGHT BEARING RESTRICTIONS: No  FALLS:  Has patient fallen in last 6 months? 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  DIAGNOSTIC FINDINGS:  S/p L shoulder debridement, SAD, DCE 01/04/24  PATIENT SURVEYS:  QuickDASH: 70.45%   COGNITION: Overall cognitive status: Within functional limits for tasks assessed     SENSATION: Numbness L hand, chronic and stable, unchanging since surgery per pt report  EDEMA/INSPECTION:  Incisions well appearing without drainage or erythema, steri strips over posterior and lateral incisions, anterior does not have steri strip but no apparent opening of incision ; gross edema apparent about L GH joint WNL; mild bruising upper arm which pt states is improving since surgery  UPPER EXTREMITY ROM:  A/PROM Right eval Left eval  Shoulder flexion A: 128 deg 91 deg passively  Shoulder abduction    Shoulder internal rotation    Shoulder external rotation    Elbow flexion    Elbow extension    Wrist flexion    Wrist extension     (Blank rows = not tested) (Key: WFL = within functional limits not formally assessed, * = concordant pain, s = stiffness/stretching sensation, NT = not tested)  Comments:    UPPER EXTREMITY MMT:  MMT Right eval Left eval  Shoulder flexion    Shoulder extension    Shoulder abduction    Shoulder extension    Shoulder internal rotation    Shoulder external rotation    Elbow flexion    Elbow extension    Grip strength 55# 65#   (Blank rows = not tested)  (Key: WFL = within functional limits not formally assessed, * = concordant pain, s = stiffness/stretching sensation, NT = not tested)  Comments: MMT deferred given acuity of surgery   PALPATION:  TTP L UT, LS, rhomboid                                                                                                                             TREATMENT DATE:  [facility] Adult PT Treatment:                                                DATE: 01/17/24 Therapeutic Exercise: Passive shoulder flexion walkbacks x5 cues to avoid WB (finger tip on counter), appropriate alignment,and comfortable ROM Seated passive shoulder slides w/ thighs support, x8 Seated passive shoulder flexion slide at table, x8 cues to avoid WB and appropriate alignment Towel grip strengthening x8 LUE HEP handout + education, discussed appropriate setup and modification PRN  Self Care: Education/discussion re: icing regimen, monitoring surgical site for typical post op complications and appropriate communication w/ provider as needed    PATIENT EDUCATION: Education details: Pt education on PT impairments, prognosis, and POC. Informed consent. Rationale for interventions, safe/appropriate HEP performance, self care  as above Person educated: Patient Education method: Explanation, Demonstration, Tactile cues, Verbal cues Education comprehension: verbalized understanding, returned demonstration, verbal cues required, tactile cues required, and needs further education    HOME EXERCISE PROGRAM: Access Code: [email or web address] URL: [email or web address] Date: 01/17/2024 Prepared by: Alm Jenny  Exercises - Seated Shoulder Flexion Towel Slide at Table Top  - 2-3 x daily - 1 sets - 8-10 reps - Seated Gripping Towel  - 2-3 x daily - 1 sets - 8-10 reps  ASSESSMENT:  CLINICAL IMPRESSION: Patient is a 73 y.o. gentleman who was seen today for physical therapy evaluation and treatment for L  shoulder s/p debridement, SAD, DCE 01/04/24. No red flags or signs of post op complications today, did encourage monitoring of his incision as he states he removed anterior steri strip yesterday. Endorses difficulty with household tasks and limiting heavier tasks as expected post op. On exam he demonstrates limitations in passive mobility secondary to transient pain. Tolerates exam/HEP well overall without adverse event, HEP and education as above. Recommend trial of skilled PT to address aforementioned deficits with aim of improving functional tolerance and reducing pain with typical activities. Pt departs today's session in no acute distress, all voiced concerns/questions addressed appropriately from PT perspective.      OBJECTIVE IMPAIRMENTS: decreased activity tolerance, decreased endurance, decreased mobility, decreased ROM, decreased strength, impaired perceived functional ability, impaired UE functional use, improper body mechanics, postural dysfunction, and pain.   ACTIVITY LIMITATIONS: carrying, lifting, dressing, reach over head, and hygiene/grooming  PARTICIPATION LIMITATIONS: meal prep, cleaning, laundry, driving, shopping, and community activity  PERSONAL FACTORS: Age, Time since onset of injury/illness/exacerbation, and 3+ comorbidities: hx chest pain, cervical fusion, lumbarfusion, HTN, hypothyroidism, LLE ulcer, R shoulder scope 2021, DM2, essential tremor, diabetic retinopathy are also affecting patient's functional outcome.   REHAB POTENTIAL: Good  CLINICAL DECISION MAKING: Stable/uncomplicated  EVALUATION COMPLEXITY: Low   GOALS:  SHORT TERM GOALS: Target date: 02/14/2024  Pt will demonstrate appropriate understanding and performance of initially prescribed HEP in order to facilitate improved independence with management of symptoms.  Baseline: HEP established  Goal status: INITIAL   2. Pt will report at least 25% improvement in overall pain levels over past week in order to  facilitate improved tolerance to typical daily activities.   Baseline: 5-11.910  Goal status: INITIAL    LONG TERM GOALS: Target date: 03/13/2024   Pt will score less than or equal to 50% on Quick DASH in order to indicate reduced levels of disability due to shoulder pain (MDC 16-20pts).  Baseline: 70.45%   Goal status: INITIAL  2.  Pt will demonstrate at least 130 degrees of active L shoulder elevation in order to demonstrate improved tolerance to functional movement patterns such as reaching overhead.  Baseline: see ROM chart above Goal status: INITIAL  3.  Pt will demonstrate at least 4+/5 shoulder flex/abd MMT for improved symmetry of UE strength and improved tolerance to functional movements.  Baseline: deferred on eval given acuity of surgery Goal status: INITIAL  4. Pt will report at least 50% decrease in overall pain levels in past week in order to facilitate improved tolerance to basic ADLs/mobility.   Baseline: 5-11.9/10  Goal status: INITIAL    5. Pt will endorse ability to mow lawn with less than 3 pt increase in pain in order to facilitate improved tolerance to usual tasks.  Baseline: unable to perform  Goal status: INITIAL  6. Pt will endorse ability to perform usual self-care ADLs such as dressing/hygiene in  order to facilitate improved return to PLOF.  Baseline: difficulty w/ upper body dressing, putting on deodorant  Goal status: INITIAL  PLAN:  PT FREQUENCY: 2x/week  PT DURATION: 8 weeks  PLANNED INTERVENTIONS: 97164- PT Re-evaluation, 97750- Physical Performance Testing, 97110-Therapeutic exercises, 97530- Therapeutic activity, [id number]- Neuromuscular re-education, 97535- Self Care, [id number]- Manual therapy, 97016- Vasopneumatic device, 79439 (1-2 muscles), 20561 (3+ muscles)- Dry Needling, Patient/Family education, Taping, Joint mobilization, Spinal mobilization, Scar mobilization, Cryotherapy, and Moist heat  PLAN FOR NEXT SESSION: Review/update HEP PRN. Work on   [facility] PROM/AAROM flexion/scaption within pt tolerance. Unresisted periscapular activation. No formal restrictions/protocol but would limit abduction and horizontal adduction first 6 weeks post op given DCE/SAD. Symptom modification strategies as indicated/appropriate.    Alm DELENA Jenny PT, DPT 01/17/2024 1:06 PM

## 2024-01-17 ENCOUNTER — Other Ambulatory Visit: Payer: Self-pay

## 2024-01-17 ENCOUNTER — Ambulatory Visit: Attending: Orthopaedic Surgery | Admitting: Physical Therapy

## 2024-01-17 ENCOUNTER — Encounter: Payer: Self-pay | Admitting: Physical Therapy

## 2024-01-17 DIAGNOSIS — M7542 Impingement syndrome of left shoulder: Secondary | ICD-10-CM | POA: Diagnosis not present

## 2024-01-17 DIAGNOSIS — G8929 Other chronic pain: Secondary | ICD-10-CM | POA: Diagnosis not present

## 2024-01-17 DIAGNOSIS — M6281 Muscle weakness (generalized): Secondary | ICD-10-CM | POA: Diagnosis not present

## 2024-01-17 DIAGNOSIS — R600 Localized edema: Secondary | ICD-10-CM | POA: Diagnosis not present

## 2024-01-17 DIAGNOSIS — M25512 Pain in left shoulder: Secondary | ICD-10-CM | POA: Diagnosis present

## 2024-01-18 ENCOUNTER — Ambulatory Visit

## 2024-01-18 DIAGNOSIS — M6281 Muscle weakness (generalized): Secondary | ICD-10-CM

## 2024-01-18 DIAGNOSIS — R600 Localized edema: Secondary | ICD-10-CM

## 2024-01-18 DIAGNOSIS — M25512 Pain in left shoulder: Secondary | ICD-10-CM

## 2024-01-18 NOTE — Therapy (Signed)
 OUTPATIENT PHYSICAL THERAPY SHOULDER TREATMENT   Patient Name: Gregory West. MRN: [id number] DOB:1950/09/05, 73 y.o., male Today's Date: 01/18/2024  END OF SESSION:  PT End of Session - 01/18/24 0928     Visit Number 2    Number of Visits 17    Date for PT Re-Evaluation 03/13/24    Authorization Type [facility]    Authorization - Visit Number 2    Authorization - Number of Visits 15    Progress Note Due on Visit 10    PT Start Time 0930    PT Stop Time 1020    PT Time Calculation (min) 50 min    Activity Tolerance Patient tolerated treatment well    Behavior During Therapy WFL for tasks assessed/performed          Past Medical History:  Diagnosis Date   Allergy not sure   Arthritis    Chest pain    a. Normal cors 2001. b. Neg stress test 2012, 05/2014.   Diabetes mellitus without complication (HCC)    GERD (gastroesophageal reflux disease)    Hyperlipidemia    Hypertension    Hypertensive response to exercise    Hypothyroidism    Obesity    Obstructive sleep apnea    Currently untreated    PONV (postoperative nausea and vomiting)    Sleep apnea    +cpap   Ulcer of left lower extremity (HCC) 12/16/2022   Past Surgical History:  Procedure Laterality Date   CARDIAC CATHETERIZATION     CERVICAL FUSION     HERNIA REPAIR     KNEE ARTHROSCOPY  1984   both knee   [facility]   POSTERIOR LUMBAR FUSION 2 WITH HARDWARE REMOVAL Left 01/04/2024   Procedure: ARTHROSCOPY, SHOULDER WITH DEBRIDEMENT;  Surgeon: Jerri Kay HERO, MD;  Location: [facility];  Service: Orthopedics;  Laterality: Left;  LEFT SHOULDER ARTHROSCOPY, EXTENSIVE DEBRIDEMENT, DISTAL CLAVICLE EXCISION, SUBACROMIAL DECOMPRESSION   SHOULDER ARTHROSCOPY WITH SUBACROMIAL DECOMPRESSION Right 09/13/2019   Procedure: RIGHT SHOULDER ARTHROSCOPY WITH EXTENSIVE DEBRIDEMENT, SUBACROMIAL DECOMPRESSION,;  Surgeon: Vernetta Lonni GRADE, MD;  Location: [facility];   Service: Orthopedics;  Laterality: Right;   SPINE SURGERY  1st Sept 1977   2nd Feb 2011   Patient Active Problem List   Diagnosis Date Noted   Arthritis of left acromioclavicular joint 01/04/2024   Diabetic retinopathy (HCC) 12/29/2023   On long term drug therapy 10/27/2023   Pure hypercholesterolemia 10/27/2023   Viral URI 06/28/2023   Splinter in skin 03/08/2023   COVID 02/02/2023   Bee sting reaction 01/11/2023   Ulcer of left lower extremity (HCC) 12/16/2022   Bilateral hand numbness 10/06/2022   Atypical pigmented lesion 09/29/2022   ED (erectile dysfunction) 09/29/2022   Fusion of spine of cervical region 09/14/2022   Bilateral third flexor stenosing tenosynovitis and first carpometacarpal osteoarthritis. 10/30/2021   Exercise counseling 07/29/2021   Other specified counseling 07/29/2021   Primary open-angle glaucoma, bilateral, mild stage 07/29/2021   Diabetic neuropathy (HCC) 07/31/2020   Abdominal aortic aneurysm (HCC) 07/31/2020   Insomnia 07/31/2020   GERD (gastroesophageal reflux disease) 01/09/2020   Impingement syndrome of left shoulder 08/02/2019   Interstitial pulmonary disease (HCC) 06/29/2018   Hypertension associated with diabetes (HCC) 07/13/2017   Benign essential tremor 07/13/2017   Kidney cyst, acquired 01/24/2017   Seborrheic keratoses 04/01/2016   Diverticulosis of colon without hemorrhage 02/26/2016   Anxiety state 10/02/2015   BPH (benign prostatic hyperplasia)  07/31/2015   Carpal tunnel syndrome, left upper limb 06/04/2015   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   Vitamin D  deficiency 03/06/2015   History of pancreatitis 03/06/2015   Lumbar spondylosis 03/04/2015   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   Obstructive sleep apnea 05/15/2014   Hyperlipidemia 05/15/2014   Glaucoma suspect 10/21/2011    PCP: Alvia Bring, DO  REFERRING PROVIDER: Jerri Kay HERO, MD  REFERRING DIAG: [phone]  (ICD-10-CM) - Chronic left shoulder pain M75.42 (ICD-10-CM) - Impingement syndrome of left shoulder  THERAPY DIAG:  Left shoulder pain, unspecified chronicity  Muscle weakness (generalized)  Localized edema  Rationale for Evaluation and Treatment: Rehabilitation  ONSET DATE: scope + DCE/SAD 01/04/24 L shoulder  SUBJECTIVE:                                                                                                                                                                                      SUBJECTIVE STATEMENT: Patient reports he has a sticking needle pain in shoulder this morning that he usually get later in the day; states he has returned to driving recently and is using arm more with reaching out to the side, as well as reaching across body to put on deoderant/dressing.  EVAL: Pt endorses chronic history with shoulder injuries, very active in sports, athletics, and [employer]. Worsening over past year prior to surgery. States since surgery pain has been steadily improving, is having difficulty w/ self care tasks, household tasks. Not performing heavier tasks around yard or home. Reports a bit of swelling in shoulder as expected, and bruising that is improving. States surgical follow up went well, reports being advised sling use PRN for comfort and no lifting 5# or greater. States yesterday he did remove one of his steri strips and it had some mild bleeding initially but no issues since. Denies constitutional symptoms (fevers, chills, SOB, etc). Has a bit of tingling in L lateral palm which he states is chronic, unchanged since surgery. Hand dominance: Right  PERTINENT HISTORY: DM, hx chest pain, cervical fusion, lumbar fusion (pt denies), HTN, hypothyroidism, LLE ulcer, R shoulder scope 2021, DM2, essential tremor, diabetic retinopathy Self reports aortic aneurysm, describes as stable and follows with cardiology  PAIN:  Are you having pain: 5-6/10 Location/description:  pinching in front of shoulder, posterior shoulder Best-worst over past week: 5-11.9/10  - aggravating factors: difficulty positioning while sitting, use of arm, picking up pots/pans, opening jar - Easing factors: sling use, icing, posiitoning    PRECAUTIONS: s/p shoulder scope, SAD, DCE (no formal restrictions on referral).   RED FLAGS: No fevers/chills, no SOB or other constitutional symptoms  WEIGHT BEARING RESTRICTIONS: No  FALLS:  Has patient fallen in last 6 months? 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  DIAGNOSTIC FINDINGS:  S/p L shoulder debridement, SAD, DCE 01/04/24  PATIENT SURVEYS:  QuickDASH: 70.45%   COGNITION: Overall cognitive status: Within functional limits for tasks assessed     SENSATION: Numbness L hand, chronic and stable, unchanging since surgery per pt report  EDEMA/INSPECTION:  Incisions well appearing without drainage or erythema, steri strips over posterior and lateral incisions, anterior does not have steri strip but no apparent opening of incision ; gross edema apparent about L GH joint WNL; mild bruising upper arm which pt states is improving since surgery  UPPER EXTREMITY ROM:  A/PROM Right eval Left eval  Shoulder flexion A: 128 deg 91 deg passively  Shoulder abduction    Shoulder internal rotation    Shoulder external rotation    Elbow flexion    Elbow extension    Wrist flexion    Wrist extension     (Blank rows = not tested) (Key: WFL = within functional limits not formally assessed, * =  concordant pain, s = stiffness/stretching sensation, NT = not tested)  Comments:    UPPER EXTREMITY MMT:  MMT Right eval Left eval  Shoulder flexion    Shoulder extension    Shoulder abduction    Shoulder extension    Shoulder internal rotation    Shoulder external rotation    Elbow flexion    Elbow extension    Grip strength 55# 65#  (Blank rows = not tested)  (Key: WFL = within functional limits not formally assessed, * = concordant pain, s = stiffness/stretching sensation, NT = not tested)  Comments: MMT deferred given acuity of surgery   PALPATION:  TTP L UT, LS, rhomboid                                                                                                                             [facility] Adult PT Treatment:                                                DATE: 01/18/2024 Therapeutic Exercise: Shoulder flexion stretch step out at high table --> discontinued d/t pain Shoulder flexion stretch with orange PB (standing) Manual Therapy:  Shoulder PROM flexion & scaption to tolerance with stretch at end range Neuromuscular re-ed: Seated with noodle --> scapula retraction 10x5 Seated  table slides --> flexion & scaption x10 each Modalities: Vaso (Lt) shoulder, 34 degrees low compression x 10 min   TREATMENT DATE:  [facility] Adult PT Treatment:                                                DATE: 01/17/24 Therapeutic Exercise: Passive shoulder flexion walkbacks x5 cues to avoid WB (finger tip on counter), appropriate alignment,and comfortable ROM Seated passive shoulder slides w/ thighs support, x8 Seated passive shoulder flexion slide at table, x8 cues to avoid WB and appropriate alignment Towel grip strengthening x8 LUE HEP handout + education, discussed appropriate setup and modification PRN  Self Care: Education/discussion re: icing regimen, monitoring surgical site for typical post op complications and appropriate communication w/ provider as needed    PATIENT  EDUCATION: Education details: Updated HEP Person educated: Patient Education method: Explanation, Demonstration, Tactile cues, Verbal cues Education comprehension: verbalized understanding, returned demonstration, verbal cues required, tactile cues required, and needs further education    HOME EXERCISE PROGRAM: Access Code: [email or web address] URL: [email or web address] Date: 01/18/2024 Prepared by: Lamarr Price  Exercises - Seated Shoulder Flexion Towel Slide at Table Top  - 2-3 x daily - 1 sets - 8-10 reps - Seated Gripping Towel  - 2-3 x daily - 1 sets - 8-10 reps - Seated Scapular Retraction  - 2-3 x daily - 1 sets - 8-10 reps - 5 sec hold - Seated Shoulder Scaption Slide at Table Top with Forearm in Neutral  - 2-3 x daily - 1 sets - 8-10 reps  ASSESSMENT:  CLINICAL IMPRESSION: Frequent cueing provided to decrease muscle guarding tendencies during shoulder flexion/scaption PROM; significant tightness noted with shoulder flexion PROM. Scapula retraction and table slides in scapular plane added to tolerance. Patient will continue to benefit from skilled therapy to progress mobility deficits, decrease pain, and increase postural strength.  EVAL: Patient is a 73 y.o. gentleman who was seen today for physical therapy evaluation and treatment for L shoulder s/p debridement, SAD, DCE 01/04/24. No red flags or signs of post op complications today, did encourage monitoring of his incision as he states he removed anterior steri strip yesterday. Endorses difficulty with household tasks and limiting heavier tasks as expected post op. On exam he demonstrates limitations in passive mobility secondary to transient pain. Tolerates exam/HEP well overall without adverse event, HEP and education as above. Recommend trial of skilled PT to address aforementioned deficits with aim of improving functional tolerance and reducing pain with typical activities. Pt departs today's session in no acute distress, all  voiced concerns/questions addressed appropriately from PT perspective.      OBJECTIVE IMPAIRMENTS: decreased activity tolerance, decreased endurance, decreased mobility, decreased ROM, decreased strength, impaired perceived functional ability, impaired UE functional use, improper body mechanics, postural dysfunction, and pain.   ACTIVITY LIMITATIONS: carrying, lifting, dressing, reach over head, and hygiene/grooming  PARTICIPATION LIMITATIONS: meal prep, cleaning, laundry, driving, shopping, and community activity  PERSONAL FACTORS: Age, Time since onset of injury/illness/exacerbation, and 3+ comorbidities: hx chest pain, cervical fusion, lumbarfusion, HTN, hypothyroidism, LLE ulcer, R shoulder scope 2021, DM2, essential tremor, diabetic retinopathy are also affecting patient's functional outcome.   REHAB POTENTIAL: Good  CLINICAL DECISION MAKING: Stable/uncomplicated  EVALUATION COMPLEXITY: Low   GOALS:  SHORT TERM GOALS: Target date: 02/14/2024  Pt will demonstrate appropriate understanding and performance of initially prescribed HEP in order  to facilitate improved independence with management of symptoms.  Baseline: HEP established  Goal status: INITIAL   2. Pt will report at least 25% improvement in overall pain levels over past week in order to facilitate improved tolerance to typical daily activities.   Baseline: 5-11.910  Goal status: INITIAL    LONG TERM GOALS: Target date: 03/13/2024   Pt will score less than or equal to 50% on Quick DASH in order to indicate reduced levels of disability due to shoulder pain (MDC 16-20pts).  Baseline: 70.45%   Goal status: INITIAL  2.  Pt will demonstrate at least 130 degrees of active L shoulder elevation in order to demonstrate improved tolerance to functional movement patterns such as reaching overhead.  Baseline: see ROM chart above Goal status: INITIAL  3.  Pt will demonstrate at least 4+/5 shoulder flex/abd MMT for improved  symmetry of UE strength and improved tolerance to functional movements.  Baseline: deferred on eval given acuity of surgery Goal status: INITIAL  4. Pt will report at least 50% decrease in overall pain levels in past week in order to facilitate improved tolerance to basic ADLs/mobility.   Baseline: 5-11.9/10  Goal status: INITIAL    5. Pt will endorse ability to mow lawn with less than 3 pt increase in pain in order to facilitate improved tolerance to usual tasks.  Baseline: unable to perform  Goal status: INITIAL  6. Pt will endorse ability to perform usual self-care ADLs such as dressing/hygiene in order to facilitate improved return to PLOF.  Baseline: difficulty w/ upper body dressing, putting on deodorant  Goal status: INITIAL  PLAN:  PT FREQUENCY: 2x/week  PT DURATION: 8 weeks  PLANNED INTERVENTIONS: 97164- PT Re-evaluation, 97750- Physical Performance Testing, 97110-Therapeutic exercises, 97530- Therapeutic activity, [id number]- Neuromuscular re-education, 97535- Self Care, [id number]- Manual therapy, 97016- Vasopneumatic device, 79439 (1-2 muscles), 20561 (3+ muscles)- Dry Needling, Patient/Family education, Taping, Joint mobilization, Spinal mobilization, Scar mobilization, Cryotherapy, and Moist heat  PLAN FOR NEXT SESSION: Review/update HEP PRN. Work on  [facility] PROM/AAROM flexion/scaption within pt tolerance. Unresisted periscapular activation. No formal restrictions/protocol but would limit abduction and horizontal adduction first 6 weeks post op given DCE/SAD. Symptom modification strategies as indicated/appropriate.    Lamarr Price, PTA 01/18/2024 10:15 AM

## 2024-01-23 NOTE — Therapy (Signed)
 OUTPATIENT PHYSICAL THERAPY SHOULDER TREATMENT   Patient Name: Gregory West [name] MRN: [id number] DOB:02-May-1951, 73 y.o., male Today's Date: 01/24/2024  END OF SESSION:  PT End of Session - 01/24/24 1014     Visit Number 3    Number of Visits 17    Date for PT Re-Evaluation 03/13/24    Authorization Type [facility]    Authorization - Visit Number 3    Authorization - Number of Visits 15    Progress Note Due on Visit 10    PT Start Time 1015    PT Stop Time 1056    PT Time Calculation (min) 41 min           Past Medical History:  Diagnosis Date   Allergy not sure   Arthritis    Chest pain    a. Normal cors 2001. b. Neg stress test 2012, 05/2014.   Diabetes mellitus without complication (HCC)    GERD (gastroesophageal reflux disease)    Hyperlipidemia    Hypertension    Hypertensive response to exercise    Hypothyroidism    Obesity    Obstructive sleep apnea    Currently untreated    PONV (postoperative nausea and vomiting)    Sleep apnea    +cpap   Ulcer of left lower extremity (HCC) 12/16/2022   Past Surgical History:  Procedure Laterality Date   CARDIAC CATHETERIZATION     CERVICAL FUSION     HERNIA REPAIR     KNEE ARTHROSCOPY  1984   both knee   [facility]   POSTERIOR LUMBAR FUSION 2 WITH HARDWARE REMOVAL Left 01/04/2024   Procedure: ARTHROSCOPY, SHOULDER WITH DEBRIDEMENT;  Surgeon: Jerri Kay HERO, MD;  Location: [facility];  Service: Orthopedics;  Laterality: Left;  LEFT SHOULDER ARTHROSCOPY, EXTENSIVE DEBRIDEMENT, DISTAL CLAVICLE EXCISION, SUBACROMIAL DECOMPRESSION   SHOULDER ARTHROSCOPY WITH SUBACROMIAL DECOMPRESSION Right 09/13/2019   Procedure: RIGHT SHOULDER ARTHROSCOPY WITH EXTENSIVE DEBRIDEMENT, SUBACROMIAL DECOMPRESSION,;  Surgeon: Vernetta Lonni GRADE, MD;  Location: [facility];  Service: Orthopedics;  Laterality: Right;   SPINE SURGERY  1st Sept 1977   2nd Feb 2011   Patient Active Problem  List   Diagnosis Date Noted   Arthritis of left acromioclavicular joint 01/04/2024   Diabetic retinopathy (HCC) 12/29/2023   On long term drug therapy 10/27/2023   Pure hypercholesterolemia 10/27/2023   Viral URI 06/28/2023   Splinter in skin 03/08/2023   COVID 02/02/2023   Bee sting reaction 01/11/2023   Ulcer of left lower extremity (HCC) 12/16/2022   Bilateral hand numbness 10/06/2022   Atypical pigmented lesion 09/29/2022   ED (erectile dysfunction) 09/29/2022   Fusion of spine of cervical region 09/14/2022   Bilateral third flexor stenosing tenosynovitis and first carpometacarpal osteoarthritis. 10/30/2021   Exercise counseling 07/29/2021   Other specified counseling 07/29/2021   Primary open-angle glaucoma, bilateral, mild stage 07/29/2021   Diabetic neuropathy (HCC) 07/31/2020   Abdominal aortic aneurysm (HCC) 07/31/2020   Insomnia 07/31/2020   GERD (gastroesophageal reflux disease) 01/09/2020   Impingement syndrome of left shoulder 08/02/2019   Interstitial pulmonary disease (HCC) 06/29/2018   Hypertension associated with diabetes (HCC) 07/13/2017   Benign essential tremor 07/13/2017   Kidney cyst, acquired 01/24/2017   Seborrheic keratoses 04/01/2016   Diverticulosis of colon without hemorrhage 02/26/2016   Anxiety state 10/02/2015   BPH (benign prostatic hyperplasia) 07/31/2015   Carpal tunnel syndrome, left upper limb 06/04/2015   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  Vitamin D  deficiency 03/06/2015   History of pancreatitis 03/06/2015   Lumbar spondylosis 03/04/2015   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   Obstructive sleep apnea 05/15/2014   Hyperlipidemia 05/15/2014   Glaucoma suspect 10/21/2011    PCP: Alvia Bring, DO  REFERRING PROVIDER: Jerri Kay HERO, MD  REFERRING DIAG: [phone] (ICD-10-CM) - Chronic left shoulder pain M75.42 (ICD-10-CM) - Impingement syndrome of left shoulder  THERAPY DIAG:   Left shoulder pain, unspecified chronicity  Muscle weakness (generalized)  Localized edema  Rationale for Evaluation and Treatment: Rehabilitation  ONSET DATE: scope + DCE/SAD 01/04/24 L shoulder  SUBJECTIVE:                                                                                                                                                                                      SUBJECTIVE STATEMENT: 01/24/2024: has been using sling more again d/t pain with his arm hanging freely. Feels like he is able to reach a little more with less pain but does feel like he compensates. 4-5/10 resting pain at present.   EVAL: Pt endorses chronic history with shoulder injuries, very active in sports, athletics, and [employer]. Worsening over past year prior to surgery. States since surgery pain has been steadily improving, is having difficulty w/ self care tasks, household tasks. Not performing heavier tasks around yard or home. Reports a bit of swelling in shoulder as expected, and bruising that is improving. States surgical follow up went well, reports being advised sling use PRN for comfort and no lifting 5# or greater. States yesterday he did remove one of his steri strips and it had some mild bleeding initially but no issues since. Denies constitutional symptoms (fevers, chills, SOB, etc). Has a bit of tingling in L lateral palm which he states is chronic, unchanged since surgery. Hand dominance: Right  PERTINENT HISTORY: DM, hx chest pain, cervical fusion, lumbar fusion (pt denies), HTN, hypothyroidism, LLE ulcer, R shoulder scope 2021, DM2, essential tremor, diabetic retinopathy Self reports aortic aneurysm, describes as stable and follows with cardiology  PAIN:  Are you having pain: 4-5/10 superior shoulder and lateral shoulder.   Per eval:  Location/description: pinching in front of shoulder, posterior shoulder Best-worst over past week: 5-11.9/10  - aggravating factors: difficulty  positioning while sitting, use of arm, picking up pots/pans, opening jar - Easing factors: sling use, icing, posiitoning    PRECAUTIONS: s/p shoulder scope, SAD, DCE (no formal restrictions on referral).   RED FLAGS: No fevers/chills, no SOB or other constitutional symptoms  WEIGHT BEARING RESTRICTIONS: No  FALLS:  Has patient fallen in last 6 months? 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  DIAGNOSTIC FINDINGS:  S/p L shoulder debridement, SAD, DCE 01/04/24  PATIENT SURVEYS:  QuickDASH: 70.45%   COGNITION: Overall cognitive status: Within functional limits for tasks assessed     SENSATION: Numbness L hand, chronic and stable, unchanging since surgery per pt report  EDEMA/INSPECTION:  Incisions well appearing without drainage or erythema, steri strips over posterior and lateral incisions, anterior does not have steri strip but no apparent opening of incision ; gross edema apparent about L GH joint WNL; mild bruising upper arm which pt states is improving since surgery  UPPER EXTREMITY ROM:  A/PROM Right eval Left eval Left 01/24/24  Shoulder flexion A: 128 deg 91 deg passively A: 99 deg, mild pain on way down  Shoulder abduction     Shoulder internal rotation     Shoulder external rotation     Elbow flexion     Elbow extension     Wrist flexion     Wrist extension      (Blank rows = not tested) (Key: WFL = within functional limits not formally assessed, * = concordant pain, s = stiffness/stretching sensation, NT = not tested)   Comments:    UPPER EXTREMITY MMT:  MMT Right eval Left eval  Shoulder flexion    Shoulder extension    Shoulder abduction    Shoulder extension    Shoulder internal rotation    Shoulder external rotation    Elbow flexion    Elbow extension    Grip strength 55# 65#  (Blank rows = not tested)  (Key: WFL = within functional limits not formally assessed, * = concordant pain, s = stiffness/stretching sensation, NT = not tested)  Comments: MMT deferred given acuity of surgery   PALPATION:  TTP L UT, LS, rhomboid                                                                                                                               TREATMENT DATE:  [facility] Adult PT Treatment:                                                DATE: 01/24/24 Therapeutic Exercise: Standing swiss ball flexion at table; 2x15 cues for comfortable ROM Supine shoulder flexion AROM <90 deg, discontinued at 5 reps d/t pain Seated passive shoulder flexion (LE support) x8 HEP discussion/education  Neuromuscular re-ed: Scapular retraction 5  sec hold, 2x10 cues for posture, wall as external cue Shoulder shrug x10 w/ cues for motor control and UT relaxation at bottom of movement  Self Care: Icing regimen, activity/positional modification (limiting horizontal adduction/abduction for symptom irritability), monitoring surgical site    [facility] Adult PT Treatment:                                                DATE: 01/18/2024 Therapeutic Exercise: Shoulder flexion stretch step out at high table --> discontinued d/t pain Shoulder flexion stretch with orange PB (standing) Manual Therapy:  Shoulder PROM flexion & scaption to tolerance with stretch at end range Neuromuscular re-ed: Seated with noodle --> scapula retraction 10x5 Seated table slides --> flexion & scaption x10 each Modalities: Vaso (Lt) shoulder, 34 degrees low compression x 10 min     [facility] Adult PT Treatment:                                                 DATE: 01/17/24 Therapeutic Exercise: Passive shoulder flexion walkbacks x5 cues to avoid WB (finger tip on counter), appropriate alignment,and comfortable ROM Seated passive shoulder slides w/ thighs support, x8 Seated passive shoulder flexion slide at table, x8 cues to avoid WB and appropriate alignment Towel grip strengthening x8 LUE HEP handout + education, discussed appropriate setup and modification PRN  Self Care: Education/discussion re: icing regimen, monitoring surgical site for typical post op complications and appropriate communication w/ provider as needed    PATIENT EDUCATION: Education details: rationale for interventions, HEP  Person educated: Patient Education method: Explanation, Demonstration, Tactile cues, Verbal cues Education comprehension: verbalized understanding, returned demonstration, verbal cues required, tactile cues required, and needs further education      HOME EXERCISE PROGRAM: Access Code: [email or web address] URL: [email or web address] Date: 01/18/2024 Prepared by: Lamarr Price  Exercises - Seated Shoulder Flexion Towel Slide at Table Top  - 2-3 x daily - 1 sets - 8-10 reps - Seated Gripping Towel  - 2-3 x daily - 1 sets - 8-10 reps - Seated Scapular Retraction  - 2-3 x daily - 1 sets - 8-10 reps - 5 sec hold - Seated Shoulder Scaption Slide at Table Top with Forearm in Neutral  - 2-3 x daily - 1 sets - 8-10 reps  ASSESSMENT:  CLINICAL IMPRESSION: 01/24/2024: Pt arrives w/ report of modest improvements in pain/mobility but continued fluctuations. Noted improvement in ROM measurements today with more pain noted on eccentric portion of AROM compared to concentric. Today continuing to focus on AAROM within comfortable ranges, tolerates well but does require intermittent cues for positioning. Given improvement in AAROM we trial gravity reduced AROM in limited ranges, discontinued d/t discomfort. Continuing to reinforce activity modification  and limiting horizontal adduction and abduction in short term to mitigate symptom irritability. No adverse events, tolerates session well overall with no increase in pain on departure. Recommend continuing along current POC in order to address relevant deficits and improve functional tolerance. Pt departs today's session in no acute distress, all voiced questions/concerns addressed appropriately from PT perspective.     EVAL: Patient is a 73 y.o. gentleman who was seen today for physical therapy evaluation and treatment for L shoulder s/p debridement, SAD, DCE 01/04/24. No red  flags or signs of post op complications today, did encourage monitoring of his incision as he states he removed anterior steri strip yesterday. Endorses difficulty with household tasks and limiting heavier tasks as expected post op. On exam he demonstrates limitations in passive mobility secondary to transient pain. Tolerates exam/HEP well overall without adverse event, HEP and education as above. Recommend trial of skilled PT to address aforementioned deficits with aim of improving functional tolerance and reducing pain with typical activities. Pt departs today's session in no acute distress, all voiced concerns/questions addressed appropriately from PT perspective.      OBJECTIVE IMPAIRMENTS: decreased activity tolerance, decreased endurance, decreased mobility, decreased ROM, decreased strength, impaired perceived functional ability, impaired UE functional use, improper body mechanics, postural dysfunction, and pain.   ACTIVITY LIMITATIONS: carrying, lifting, dressing, reach over head, and hygiene/grooming  PARTICIPATION LIMITATIONS: meal prep, cleaning, laundry, driving, shopping, and community activity  PERSONAL FACTORS: Age, Time since onset of injury/illness/exacerbation, and 3+ comorbidities: hx chest pain, cervical fusion, lumbarfusion, HTN, hypothyroidism, LLE ulcer, R shoulder scope 2021, DM2, essential tremor, diabetic  retinopathy are also affecting patient's functional outcome.   REHAB POTENTIAL: Good  CLINICAL DECISION MAKING: Stable/uncomplicated  EVALUATION COMPLEXITY: Low   GOALS:  SHORT TERM GOALS: Target date: 02/14/2024  Pt will demonstrate appropriate understanding and performance of initially prescribed HEP in order to facilitate improved independence with management of symptoms.  Baseline: HEP established  Goal status: INITIAL   2. Pt will report at least 25% improvement in overall pain levels over past week in order to facilitate improved tolerance to typical daily activities.   Baseline: 5-11.910  Goal status: INITIAL    LONG TERM GOALS: Target date: 03/13/2024   Pt will score less than or equal to 50% on Quick DASH in order to indicate reduced levels of disability due to shoulder pain (MDC 16-20pts).  Baseline: 70.45%   Goal status: INITIAL  2.  Pt will demonstrate at least 130 degrees of active L shoulder elevation in order to demonstrate improved tolerance to functional movement patterns such as reaching overhead.  Baseline: see ROM chart above Goal status: INITIAL  3.  Pt will demonstrate at least 4+/5 shoulder flex/abd MMT for improved symmetry of UE strength and improved tolerance to functional movements.  Baseline: deferred on eval given acuity of surgery Goal status: INITIAL  4. Pt will report at least 50% decrease in overall pain levels in past week in order to facilitate improved tolerance to basic ADLs/mobility.   Baseline: 5-11.9/10  Goal status: INITIAL    5. Pt will endorse ability to mow lawn with less than 3 pt increase in pain in order to facilitate improved tolerance to usual tasks.  Baseline: unable to perform  Goal status: INITIAL  6. Pt will endorse ability to perform usual self-care ADLs such as dressing/hygiene in order to facilitate improved return to PLOF.  Baseline: difficulty w/ upper body dressing, putting on deodorant  Goal status:  INITIAL  PLAN:  PT FREQUENCY: 2x/week  PT DURATION: 8 weeks  PLANNED INTERVENTIONS: 97164- PT Re-evaluation, 97750- Physical Performance Testing, 97110-Therapeutic exercises, 97530- Therapeutic activity, [id number]- Neuromuscular re-education, 97535- Self Care, [id number]- Manual therapy, 97016- Vasopneumatic device, 79439 (1-2 muscles), 20561 (3+ muscles)- Dry Needling, Patient/Family education, Taping, Joint mobilization, Spinal mobilization, Scar mobilization, Cryotherapy, and Moist heat  PLAN FOR NEXT SESSION: Review/update HEP PRN. Work on  [facility] PROM/AAROM flexion/scaption within pt tolerance. Unresisted periscapular activation. No formal restrictions/protocol but would limit abduction and horizontal adduction first 6 weeks post  op given DCE/SAD. Symptom modification strategies as indicated/appropriate.    Alm DELENA Jenny PT, DPT 01/24/2024 11:01 AM

## 2024-01-24 ENCOUNTER — Encounter: Payer: Self-pay | Admitting: Physical Therapy

## 2024-01-24 ENCOUNTER — Ambulatory Visit: Admitting: Physical Therapy

## 2024-01-24 DIAGNOSIS — M25512 Pain in left shoulder: Secondary | ICD-10-CM

## 2024-01-24 DIAGNOSIS — M6281 Muscle weakness (generalized): Secondary | ICD-10-CM

## 2024-01-24 DIAGNOSIS — R600 Localized edema: Secondary | ICD-10-CM

## 2024-01-26 ENCOUNTER — Encounter

## 2024-01-31 ENCOUNTER — Ambulatory Visit: Attending: Orthopaedic Surgery

## 2024-01-31 ENCOUNTER — Encounter: Payer: Self-pay | Admitting: Sports Medicine

## 2024-01-31 DIAGNOSIS — M25512 Pain in left shoulder: Secondary | ICD-10-CM | POA: Diagnosis present

## 2024-01-31 DIAGNOSIS — M6281 Muscle weakness (generalized): Secondary | ICD-10-CM | POA: Insufficient documentation

## 2024-01-31 DIAGNOSIS — R600 Localized edema: Secondary | ICD-10-CM | POA: Diagnosis present

## 2024-01-31 NOTE — Therapy (Signed)
 OUTPATIENT PHYSICAL THERAPY SHOULDER TREATMENT   Patient Name: Gregory HARRIOTT [name] MRN: [id number] DOB:01-09-1951, 73 y.o., male Today's Date: 01/31/2024  END OF SESSION:  PT End of Session - 01/31/24 1019     Visit Number 4    Number of Visits 17    Date for PT Re-Evaluation 03/13/24    Authorization Type [facility]    Authorization - Visit Number 4    Authorization - Number of Visits 15    Progress Note Due on Visit 10    PT Start Time 1019    PT Stop Time 1107    PT Time Calculation (min) 48 min    Activity Tolerance Patient tolerated treatment well    Behavior During Therapy WFL for tasks assessed/performed         Past Medical History:  Diagnosis Date   Allergy not sure   Arthritis    Chest pain    a. Normal cors 2001. b. Neg stress test 2012, 05/2014.   Diabetes mellitus without complication (HCC)    GERD (gastroesophageal reflux disease)    Hyperlipidemia    Hypertension    Hypertensive response to exercise    Hypothyroidism    Obesity    Obstructive sleep apnea    Currently untreated    PONV (postoperative nausea and vomiting)    Sleep apnea    +cpap   Ulcer of left lower extremity (HCC) 12/16/2022   Past Surgical History:  Procedure Laterality Date   CARDIAC CATHETERIZATION     CERVICAL FUSION     HERNIA REPAIR     KNEE ARTHROSCOPY  1984   both knee   [facility]   POSTERIOR LUMBAR FUSION 2 WITH HARDWARE REMOVAL Left 01/04/2024   Procedure: ARTHROSCOPY, SHOULDER WITH DEBRIDEMENT;  Surgeon: Jerri Kay HERO, MD;  Location: [facility];  Service: Orthopedics;  Laterality: Left;  LEFT SHOULDER ARTHROSCOPY, EXTENSIVE DEBRIDEMENT, DISTAL CLAVICLE EXCISION, SUBACROMIAL DECOMPRESSION   SHOULDER ARTHROSCOPY WITH SUBACROMIAL DECOMPRESSION Right 09/13/2019   Procedure: RIGHT SHOULDER ARTHROSCOPY WITH EXTENSIVE DEBRIDEMENT, SUBACROMIAL DECOMPRESSION,;  Surgeon: Vernetta Lonni GRADE, MD;  Location: [facility];   Service: Orthopedics;  Laterality: Right;   SPINE SURGERY  1st Sept 1977   2nd Feb 2011   Patient Active Problem List   Diagnosis Date Noted   Arthritis of left acromioclavicular joint 01/04/2024   Diabetic retinopathy (HCC) 12/29/2023   On long term drug therapy 10/27/2023   Pure hypercholesterolemia 10/27/2023   Viral URI 06/28/2023   Splinter in skin 03/08/2023   COVID 02/02/2023   Bee sting reaction 01/11/2023   Ulcer of left lower extremity (HCC) 12/16/2022   Bilateral hand numbness 10/06/2022   Atypical pigmented lesion 09/29/2022   ED (erectile dysfunction) 09/29/2022   Fusion of spine of cervical region 09/14/2022   Bilateral third flexor stenosing tenosynovitis and first carpometacarpal osteoarthritis. 10/30/2021   Exercise counseling 07/29/2021   Other specified counseling 07/29/2021   Primary open-angle glaucoma, bilateral, mild stage 07/29/2021   Diabetic neuropathy (HCC) 07/31/2020   Abdominal aortic aneurysm (HCC) 07/31/2020   Insomnia 07/31/2020   GERD (gastroesophageal reflux disease) 01/09/2020   Impingement syndrome of left shoulder 08/02/2019   Interstitial pulmonary disease (HCC) 06/29/2018   Hypertension associated with diabetes (HCC) 07/13/2017   Benign essential tremor 07/13/2017   Kidney cyst, acquired 01/24/2017   Seborrheic keratoses 04/01/2016   Diverticulosis of colon without hemorrhage 02/26/2016   Anxiety state 10/02/2015   BPH (benign prostatic hyperplasia) 07/31/2015  Carpal tunnel syndrome, left upper limb 06/04/2015   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   Vitamin D  deficiency 03/06/2015   History of pancreatitis 03/06/2015   Lumbar spondylosis 03/04/2015   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   Obstructive sleep apnea 05/15/2014   Hyperlipidemia 05/15/2014   Glaucoma suspect 10/21/2011    PCP: Alvia Bring, DO  REFERRING PROVIDER: Jerri Kay HERO, MD  REFERRING DIAG: [phone]  (ICD-10-CM) - Chronic left shoulder pain M75.42 (ICD-10-CM) - Impingement syndrome of left shoulder  THERAPY DIAG:  Left shoulder pain, unspecified chronicity  Muscle weakness (generalized)  Localized edema  Rationale for Evaluation and Treatment: Rehabilitation  ONSET DATE: scope + DCE/SAD 01/04/24 L shoulder  SUBJECTIVE:                                                                                                                                                                                      SUBJECTIVE STATEMENT: Patient reports he has pinching pain when reaching side to side; states he went without the sling all day yesterday. Patient reports he is driving but does majority of driving with Rt hand; states he has 7/10 shoulder pain today.   EVAL: Pt endorses chronic history with shoulder injuries, very active in sports, athletics, and [employer]. Worsening over past year prior to surgery. States since surgery pain has been steadily improving, is having difficulty w/ self care tasks, household tasks. Not performing heavier tasks around yard or home. Reports a bit of swelling in shoulder as expected, and bruising that is improving. States surgical follow up went well, reports being advised sling use PRN for comfort and no lifting 5# or greater. States yesterday he did remove one of his steri strips and it had some mild bleeding initially but no issues since. Denies constitutional symptoms (fevers, chills, SOB, etc). Has a bit of tingling in L lateral palm which he states is chronic, unchanged since surgery. Hand dominance: Right  PERTINENT HISTORY: DM, hx chest pain, cervical fusion, lumbar fusion (pt denies), HTN, hypothyroidism, LLE ulcer, R shoulder scope 2021, DM2, essential tremor, diabetic retinopathy Self reports aortic aneurysm, describes as stable and follows with cardiology  PAIN:  Are you having pain: 7/10 superior shoulder and lateral shoulder.   Per eval:   Location/description: pinching in front of shoulder, posterior shoulder Best-worst over past week: 5-11.9/10  - aggravating factors: difficulty positioning while sitting, use of arm, picking up pots/pans, opening jar - Easing factors: sling use, icing, posiitoning    PRECAUTIONS: s/p shoulder scope, SAD, DCE (no formal restrictions on referral).   RED FLAGS: No fevers/chills, no SOB or other constitutional symptoms  WEIGHT BEARING RESTRICTIONS: No  FALLS:  Has patient fallen in last 6 months? 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  DIAGNOSTIC FINDINGS:  S/p L shoulder debridement, SAD, DCE 01/04/24  PATIENT SURVEYS:  QuickDASH: 70.45%   COGNITION: Overall cognitive status: Within functional limits for tasks assessed     SENSATION: Numbness L hand, chronic and stable, unchanging since surgery per pt report  EDEMA/INSPECTION:  Incisions well appearing without drainage or erythema, steri strips over posterior and lateral incisions, anterior does not have steri strip but no apparent opening of incision ; gross edema apparent about L GH joint WNL; mild bruising upper arm which pt states is improving since surgery  UPPER EXTREMITY ROM:  A/PROM Right eval Left eval Left 01/24/24 Left 01/31/24  Shoulder flexion A: 128 deg 91 deg passively A: 99 deg, mild pain on way down A: 120 deg   Shoulder abduction      Shoulder internal rotation      Shoulder external rotation      Elbow flexion      Elbow extension      Wrist flexion      Wrist  extension       (Blank rows = not tested) (Key: WFL = within functional limits not formally assessed, * = concordant pain, s = stiffness/stretching sensation, NT = not tested)  Comments:    UPPER EXTREMITY MMT:  MMT Right eval Left eval 01/31/24 R / L  Shoulder flexion     Shoulder extension     Shoulder abduction     Shoulder extension     Shoulder internal rotation     Shoulder external rotation     Elbow flexion     Elbow extension     Grip strength 55# 65# 55# / 80#  (Blank rows = not tested)  (Key: WFL = within functional limits not formally assessed, * = concordant pain, s = stiffness/stretching sensation, NT = not tested)  Comments: MMT deferred given acuity of surgery   PALPATION:  TTP L UT, LS, rhomboid                                                                                                                             [facility] Adult PT Treatment:                                                DATE: 01/31/2024 Therapeutic  Exercise: Shoulder AROM flexion measurements (see above) Grip strength measurement (see above) Seated UT/LS/scalenes stretches  Neuromuscular re-ed: Standing with back against noodle for postural awareness:  Scapula retraction 2x10x5 Shoulder shrug x10 w/ cues for motor control and UT relaxation at bottom of movement Bkwd shoulder circles (shrug + scap retraction) 2x10 Therapeutic Activity: Shoulder flexion PROM with walk-out at counter -> discontinued d/t pain Standing shoulder flexion AAROM with red PB  Supine shoulder flexion AAROM (assist from opp hand) Seated scaption arm slide on table  Seated shoulder AROM within pain-free range x 5 Modalities: Vaso (Lt) shoulder, 34 degrees low compression x 10 min    TREATMENT DATE:  [facility] Adult PT Treatment:                                                DATE: 01/24/24 Therapeutic Exercise: Standing swiss ball flexion at table; 2x15 cues for comfortable ROM Supine shoulder flexion AROM <90 deg,  discontinued at 5 reps d/t pain Seated passive shoulder flexion (LE support) x8 HEP discussion/education  Neuromuscular re-ed: Scapular retraction 5 sec hold, 2x10 cues for posture, wall as external cue Shoulder shrug x10 w/ cues for motor control and UT relaxation at bottom of movement  Self Care: Icing regimen, activity/positional modification (limiting horizontal adduction/abduction for symptom irritability), monitoring surgical site    [facility] Adult PT Treatment:                                                DATE: 01/18/2024 Therapeutic Exercise: Shoulder flexion stretch step out at high table --> discontinued d/t pain Shoulder flexion stretch with orange PB (standing) Manual Therapy:  Shoulder PROM flexion & scaption to tolerance with stretch at end range Neuromuscular re-ed: Seated with noodle --> scapula retraction 10x5 Seated table slides --> flexion & scaption x10 each Modalities: Vaso (Lt) shoulder, 34 degrees low compression x 10 min    PATIENT EDUCATION: Education details: Updated HEP  Person educated: Patient Education method: Explanation, Demonstration, Tactile cues, Verbal cues Education comprehension: verbalized understanding, returned demonstration, verbal cues required, tactile cues required, and needs further education      HOME EXERCISE PROGRAM: Access Code: [email or web address] URL: [email or web address] Date: 01/18/2024 Prepared by: Lamarr Price  Exercises - Seated Shoulder Flexion Towel Slide at Table Top  - 2-3 x daily - 1 sets - 8-10 reps - Seated Gripping Towel  - 2-3 x daily - 1 sets - 8-10 reps - Seated Scapular Retraction  - 2-3 x daily - 1 sets - 8-10 reps - 5 sec hold - Seated Shoulder Scaption Slide at Table Top with Forearm in Neutral  - 2-3 x daily - 1 sets - 8-10 reps  ASSESSMENT:  CLINICAL IMPRESSION:  Patient is making steady progress with shoulder flexion AROM; cueing needed to keep patient mindful of staying in pain-free range.  Initial pain at anterior shoulder with active assist and active shoulder flexion, however pain decreased with repetition from moderate to mild by second repetition. Deferred adding supine shoulder AAROM to HEP due to continued pain and patient's difficulty with keeping Lt arm relaxed.   EVAL: Patient is a 73 y.o. gentleman who was seen today for physical therapy evaluation and treatment for L shoulder s/p debridement, SAD,  DCE 01/04/24. No red flags or signs of post op complications today, did encourage monitoring of his incision as he states he removed anterior steri strip yesterday. Endorses difficulty with household tasks and limiting heavier tasks as expected post op. On exam he demonstrates limitations in passive mobility secondary to transient pain. Tolerates exam/HEP well overall without adverse event, HEP and education as above. Recommend trial of skilled PT to address aforementioned deficits with aim of improving functional tolerance and reducing pain with typical activities. Pt departs today's session in no acute distress, all voiced concerns/questions addressed appropriately from PT perspective.      OBJECTIVE IMPAIRMENTS: decreased activity tolerance, decreased endurance, decreased mobility, decreased ROM, decreased strength, impaired perceived functional ability, impaired UE functional use, improper body mechanics, postural dysfunction, and pain.   ACTIVITY LIMITATIONS: carrying, lifting, dressing, reach over head, and hygiene/grooming  PARTICIPATION LIMITATIONS: meal prep, cleaning, laundry, driving, shopping, and community activity  PERSONAL FACTORS: Age, Time since onset of injury/illness/exacerbation, and 3+ comorbidities: hx chest pain, cervical fusion, lumbarfusion, HTN, hypothyroidism, LLE ulcer, R shoulder scope 2021, DM2, essential tremor, diabetic retinopathy are also affecting patient's functional outcome.   REHAB POTENTIAL: Good  CLINICAL DECISION MAKING:  Stable/uncomplicated  EVALUATION COMPLEXITY: Low   GOALS:  SHORT TERM GOALS: Target date: 02/14/2024  Pt will demonstrate appropriate understanding and performance of initially prescribed HEP in order to facilitate improved independence with management of symptoms.  Baseline: HEP established  Goal status: INITIAL   2. Pt will report at least 25% improvement in overall pain levels over past week in order to facilitate improved tolerance to typical daily activities.   Baseline: 5-11.910  Goal status: INITIAL    LONG TERM GOALS: Target date: 03/13/2024   Pt will score less than or equal to 50% on Quick DASH in order to indicate reduced levels of disability due to shoulder pain (MDC 16-20pts).  Baseline: 70.45%   Goal status: INITIAL  2.  Pt will demonstrate at least 130 degrees of active L shoulder elevation in order to demonstrate improved tolerance to functional movement patterns such as reaching overhead.  Baseline: see ROM chart above Goal status: INITIAL  3.  Pt will demonstrate at least 4+/5 shoulder flex/abd MMT for improved symmetry of UE strength and improved tolerance to functional movements.  Baseline: deferred on eval given acuity of surgery Goal status: INITIAL  4. Pt will report at least 50% decrease in overall pain levels in past week in order to facilitate improved tolerance to basic ADLs/mobility.   Baseline: 5-11.9/10  Goal status: INITIAL    5. Pt will endorse ability to mow lawn with less than 3 pt increase in pain in order to facilitate improved tolerance to usual tasks.  Baseline: unable to perform  Goal status: INITIAL  6. Pt will endorse ability to perform usual self-care ADLs such as dressing/hygiene in order to facilitate improved return to PLOF.  Baseline: difficulty w/ upper body dressing, putting on deodorant  Goal status: INITIAL  PLAN:  PT FREQUENCY: 2x/week  PT DURATION: 8 weeks  PLANNED INTERVENTIONS: 97164- PT Re-evaluation, 97750-  Physical Performance Testing, 97110-Therapeutic exercises, 97530- Therapeutic activity, [id number]- Neuromuscular re-education, 97535- Self Care, [id number]- Manual therapy, 97016- Vasopneumatic device, 79439 (1-2 muscles), 20561 (3+ muscles)- Dry Needling, Patient/Family education, Taping, Joint mobilization, Spinal mobilization, Scar mobilization, Cryotherapy, and Moist heat  PLAN FOR NEXT SESSION: Review/update HEP PRN. Work on  [facility] PROM/AAROM flexion/scaption within pt tolerance. Unresisted periscapular activation. No formal restrictions/protocol but would limit abduction and horizontal adduction  first 6 weeks post op given DCE/SAD. Symptom modification strategies as indicated/appropriate.    Lamarr Price, PTA 01/31/2024 11:03 AM

## 2024-02-02 ENCOUNTER — Ambulatory Visit

## 2024-02-02 DIAGNOSIS — M25512 Pain in left shoulder: Secondary | ICD-10-CM | POA: Diagnosis not present

## 2024-02-02 DIAGNOSIS — R600 Localized edema: Secondary | ICD-10-CM

## 2024-02-02 DIAGNOSIS — M6281 Muscle weakness (generalized): Secondary | ICD-10-CM

## 2024-02-02 NOTE — Therapy (Signed)
 OUTPATIENT PHYSICAL THERAPY SHOULDER TREATMENT   Patient Name: Gregory POTASH [name] MRN: [id number] DOB:July 15, 1950, 73 y.o., male Today's Date: 02/02/2024  END OF SESSION:  PT End of Session - 02/02/24 1019     Visit Number 5    Number of Visits 17    Date for PT Re-Evaluation 03/13/24    Authorization Type [facility]    Authorization - Visit Number 5    Authorization - Number of Visits 15    Progress Note Due on Visit 10    PT Start Time 1019    PT Stop Time 1112    PT Time Calculation (min) 53 min    Activity Tolerance Patient tolerated treatment well    Behavior During Therapy WFL for tasks assessed/performed         Past Medical History:  Diagnosis Date   Allergy not sure   Arthritis    Chest pain    a. Normal cors 2001. b. Neg stress test 2012, 05/2014.   Diabetes mellitus without complication (HCC)    GERD (gastroesophageal reflux disease)    Hyperlipidemia    Hypertension    Hypertensive response to exercise    Hypothyroidism    Obesity    Obstructive sleep apnea    Currently untreated    PONV (postoperative nausea and vomiting)    Sleep apnea    +cpap   Ulcer of left lower extremity (HCC) 12/16/2022   Past Surgical History:  Procedure Laterality Date   CARDIAC CATHETERIZATION     CERVICAL FUSION     HERNIA REPAIR     KNEE ARTHROSCOPY  1984   both knee   [facility]   POSTERIOR LUMBAR FUSION 2 WITH HARDWARE REMOVAL Left 01/04/2024   Procedure: ARTHROSCOPY, SHOULDER WITH DEBRIDEMENT;  Surgeon: Jerri Kay HERO, MD;  Location: [facility];  Service: Orthopedics;  Laterality: Left;  LEFT SHOULDER ARTHROSCOPY, EXTENSIVE DEBRIDEMENT, DISTAL CLAVICLE EXCISION, SUBACROMIAL DECOMPRESSION   SHOULDER ARTHROSCOPY WITH SUBACROMIAL DECOMPRESSION Right 09/13/2019   Procedure: RIGHT SHOULDER ARTHROSCOPY WITH EXTENSIVE DEBRIDEMENT, SUBACROMIAL DECOMPRESSION,;  Surgeon: Vernetta Lonni GRADE, MD;  Location: [facility];   Service: Orthopedics;  Laterality: Right;   SPINE SURGERY  1st Sept 1977   2nd Feb 2011   Patient Active Problem List   Diagnosis Date Noted   Arthritis of left acromioclavicular joint 01/04/2024   Diabetic retinopathy (HCC) 12/29/2023   On long term drug therapy 10/27/2023   Pure hypercholesterolemia 10/27/2023   Viral URI 06/28/2023   Splinter in skin 03/08/2023   COVID 02/02/2023   Bee sting reaction 01/11/2023   Ulcer of left lower extremity (HCC) 12/16/2022   Bilateral hand numbness 10/06/2022   Atypical pigmented lesion 09/29/2022   ED (erectile dysfunction) 09/29/2022   Fusion of spine of cervical region 09/14/2022   Bilateral third flexor stenosing tenosynovitis and first carpometacarpal osteoarthritis. 10/30/2021   Exercise counseling 07/29/2021   Other specified counseling 07/29/2021   Primary open-angle glaucoma, bilateral, mild stage 07/29/2021   Diabetic neuropathy (HCC) 07/31/2020   Abdominal aortic aneurysm (HCC) 07/31/2020   Insomnia 07/31/2020   GERD (gastroesophageal reflux disease) 01/09/2020   Impingement syndrome of left shoulder 08/02/2019   Interstitial pulmonary disease (HCC) 06/29/2018   Hypertension associated with diabetes (HCC) 07/13/2017   Benign essential tremor 07/13/2017   Kidney cyst, acquired 01/24/2017   Seborrheic keratoses 04/01/2016   Diverticulosis of colon without hemorrhage 02/26/2016   Anxiety state 10/02/2015   BPH (benign prostatic hyperplasia) 07/31/2015  Carpal tunnel syndrome, left upper limb 06/04/2015   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   Vitamin D  deficiency 03/06/2015   History of pancreatitis 03/06/2015   Lumbar spondylosis 03/04/2015   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   Obstructive sleep apnea 05/15/2014   Hyperlipidemia 05/15/2014   Glaucoma suspect 10/21/2011    PCP: Alvia Bring, DO  REFERRING PROVIDER: Jerri Kay HERO, MD  REFERRING DIAG: [phone]  (ICD-10-CM) - Chronic left shoulder pain M75.42 (ICD-10-CM) - Impingement syndrome of left shoulder  THERAPY DIAG:  Left shoulder pain, unspecified chronicity  Muscle weakness (generalized)  Localized edema  Rationale for Evaluation and Treatment: Rehabilitation  ONSET DATE: scope + DCE/SAD 01/04/24 L shoulder  SUBJECTIVE:                                                                                                                                                                                      SUBJECTIVE STATEMENT: Patient reports is using sling intermittently; states he has 6-7/10 pain on top shoulder and posterior shoulder. Patient states pain at top of shoulder is like a pin prick.   EVAL: Pt endorses chronic history with shoulder injuries, very active in sports, athletics, and [employer]. Worsening over past year prior to surgery. States since surgery pain has been steadily improving, is having difficulty w/ self care tasks, household tasks. Not performing heavier tasks around yard or home. Reports a bit of swelling in shoulder as expected, and bruising that is improving. States surgical follow up went well, reports being advised sling use PRN for comfort and no lifting 5# or greater. States yesterday he did remove one of his steri strips and it had some mild bleeding initially but no issues since. Denies constitutional symptoms (fevers, chills, SOB, etc). Has a bit of tingling in L lateral palm which he states is chronic, unchanged since surgery. Hand dominance: Right  PERTINENT HISTORY: DM, hx chest pain, cervical fusion, lumbar fusion (pt denies), HTN, hypothyroidism, LLE ulcer, R shoulder scope 2021, DM2, essential tremor, diabetic retinopathy Self reports aortic aneurysm, describes as stable and follows with cardiology  PAIN:  Are you having pain: 6-7/10 superior shoulder and posterior shoulder.   Per eval:  Location/description: pinching in front of shoulder, posterior  shoulder Best-worst over past week: 5-11.9/10  - aggravating factors: difficulty positioning while sitting, use of arm, picking up pots/pans, opening jar - Easing factors: sling use, icing, posiitoning    PRECAUTIONS: s/p shoulder scope, SAD, DCE (no formal restrictions on referral).   RED FLAGS: No fevers/chills, no SOB or other constitutional symptoms  WEIGHT BEARING RESTRICTIONS: No  FALLS:  Has patient fallen in last 6 months? 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  DIAGNOSTIC FINDINGS:  S/p L shoulder debridement, SAD, DCE 01/04/24  PATIENT SURVEYS:  QuickDASH: 70.45%   COGNITION: Overall cognitive status: Within functional limits for tasks assessed     SENSATION: Numbness L hand, chronic and stable, unchanging since surgery per pt report  EDEMA/INSPECTION:  Incisions well appearing without drainage or erythema, steri strips over posterior and lateral incisions, anterior does not have steri strip but no apparent opening of incision ; gross edema apparent about L GH joint WNL; mild bruising upper arm which pt states is improving since surgery  UPPER EXTREMITY ROM:  A/PROM Right eval Left eval Left 01/24/24 Left 01/31/24  Shoulder flexion A: 128 deg 91 deg passively A: 99 deg, mild pain on way down A: 120 deg   Shoulder abduction      Shoulder internal rotation      Shoulder external rotation      Elbow flexion      Elbow extension      Wrist flexion      Wrist extension       (Blank rows = not tested) (Key: WFL = within  functional limits not formally assessed, * = concordant pain, s = stiffness/stretching sensation, NT = not tested)  Comments:    UPPER EXTREMITY MMT:  MMT Right eval Left eval 01/31/24 R / L  Shoulder flexion     Shoulder extension     Shoulder abduction     Shoulder extension     Shoulder internal rotation     Shoulder external rotation     Elbow flexion     Elbow extension     Grip strength 55# 65# 55# / 80#  (Blank rows = not tested)  (Key: WFL = within functional limits not formally assessed, * = concordant pain, s = stiffness/stretching sensation, NT = not tested)  Comments: MMT deferred given acuity of surgery   PALPATION:  TTP L UT, LS, rhomboid                                                                                                                             [facility] Adult PT Treatment:                                                DATE: 02/02/2024 Therapeutic Exercise: Self-massage with green spiky ball --> posterior shoulder Self-massage with  yellow spiky ball --> pecs Neuromuscular re-ed: Shoulder shrugs + shoulder depression focus Shoulder flexion finger ladder Wall slides --> flexion; attempted 1#DB but discontinued d/t mild pain on eccentric phase Therapeutic Activity: Standing:  Shoulder flexion & scaption rolling red PB on table Small circles flexion --> scaption with red PB Pulleys --> flexion x 2 min Supine shoulder flexion AAROM (with opp hand) Modalities: Vaso (Lt) shoulder, med compression, 34 degrees x 15 min    [facility] Adult PT Treatment:                                                DATE: 01/31/2024 Therapeutic Exercise: Shoulder AROM flexion measurements (see above) Grip strength measurement (see above) Seated UT/LS/scalenes stretches  Neuromuscular re-ed: Standing with back against noodle for postural awareness:  Scapula retraction 2x10x5 Shoulder shrug x10 w/ cues for motor control and UT relaxation at bottom of movement Bkwd shoulder  circles (shrug + scap retraction) 2x10 Therapeutic Activity: Shoulder flexion PROM with walk-out at counter -> discontinued d/t pain Standing shoulder flexion AAROM with red PB  Supine shoulder flexion AAROM (assist from opp hand) Seated scaption arm slide on table  Seated shoulder AROM within pain-free range x 5 Modalities: Vaso (Lt) shoulder, 34 degrees low compression x 10 min   TREATMENT DATE:  [facility] Adult PT Treatment:                                                DATE: 01/24/24 Therapeutic Exercise: Standing swiss ball flexion at table; 2x15 cues for comfortable ROM Supine shoulder flexion AROM <90 deg, discontinued at 5 reps d/t pain Seated passive shoulder flexion (LE support) x8 HEP discussion/education  Neuromuscular re-ed: Scapular retraction 5 sec hold, 2x10 cues for posture, wall as external cue Shoulder shrug x10 w/ cues for motor control and UT relaxation at bottom of movement  Self Care: Icing regimen, activity/positional modification (limiting horizontal adduction/abduction for symptom irritability), monitoring surgical site    [facility] Adult PT Treatment:                                                DATE: 01/18/2024 Therapeutic Exercise: Shoulder flexion stretch step out at high table --> discontinued d/t pain Shoulder flexion stretch with orange PB (standing) Manual Therapy:  Shoulder PROM flexion & scaption to tolerance with stretch at end range Neuromuscular re-ed: Seated with noodle --> scapula retraction 10x5 Seated table slides --> flexion & scaption x10 each Modalities: Vaso (Lt) shoulder, 34 degrees low compression x 10 min    PATIENT EDUCATION: Education details: Updated HEP  Person educated: Patient Education method: Explanation, Demonstration, Tactile cues, Verbal cues Education comprehension: verbalized understanding, returned demonstration, verbal cues required, tactile cues required, and needs further education      HOME EXERCISE  PROGRAM: Access Code: [email or web address] URL: [email or web address] Date: 01/18/2024 Prepared by: Lamarr Price  Exercises - Seated Shoulder Flexion Towel Slide at Table Top  - 2-3 x daily - 1 sets - 8-10 reps - Seated Gripping Towel  - 2-3 x daily - 1 sets - 8-10 reps - Seated Scapular Retraction  -  2-3 x daily - 1 sets - 8-10 reps - 5 sec hold - Seated Shoulder Scaption Slide at Table Top with Forearm in Neutral  - 2-3 x daily - 1 sets - 8-10 reps  ASSESSMENT:  CLINICAL IMPRESSION:  Progressed functional reaching with assisted arm circles on physio ball in flexion/scaption planes; patient able to complete with no exacerbation of pain. Mild pain at top of shoulder on eccentric phase of flexion wall slides.   EVAL: Patient is a 73 y.o. gentleman who was seen today for physical therapy evaluation and treatment for L shoulder s/p debridement, SAD, DCE 01/04/24. No red flags or signs of post op complications today, did encourage monitoring of his incision as he states he removed anterior steri strip yesterday. Endorses difficulty with household tasks and limiting heavier tasks as expected post op. On exam he demonstrates limitations in passive mobility secondary to transient pain. Tolerates exam/HEP well overall without adverse event, HEP and education as above. Recommend trial of skilled PT to address aforementioned deficits with aim of improving functional tolerance and reducing pain with typical activities. Pt departs today's session in no acute distress, all voiced concerns/questions addressed appropriately from PT perspective.      OBJECTIVE IMPAIRMENTS: decreased activity tolerance, decreased endurance, decreased mobility, decreased ROM, decreased strength, impaired perceived functional ability, impaired UE functional use, improper body mechanics, postural dysfunction, and pain.   ACTIVITY LIMITATIONS: carrying, lifting, dressing, reach over head, and hygiene/grooming  PARTICIPATION  LIMITATIONS: meal prep, cleaning, laundry, driving, shopping, and community activity  PERSONAL FACTORS: Age, Time since onset of injury/illness/exacerbation, and 3+ comorbidities: hx chest pain, cervical fusion, lumbarfusion, HTN, hypothyroidism, LLE ulcer, R shoulder scope 2021, DM2, essential tremor, diabetic retinopathy are also affecting patient's functional outcome.   REHAB POTENTIAL: Good  CLINICAL DECISION MAKING: Stable/uncomplicated  EVALUATION COMPLEXITY: Low   GOALS:  SHORT TERM GOALS: Target date: 02/14/2024  Pt will demonstrate appropriate understanding and performance of initially prescribed HEP in order to facilitate improved independence with management of symptoms.  Baseline: HEP established  Goal status: INITIAL   2. Pt will report at least 25% improvement in overall pain levels over past week in order to facilitate improved tolerance to typical daily activities.   Baseline: 5-11.910  Goal status: INITIAL     LONG TERM GOALS: Target date: 03/13/2024   Pt will score less than or equal to 50% on Quick DASH in order to indicate reduced levels of disability due to shoulder pain (MDC 16-20pts).  Baseline: 70.45%   Goal status: INITIAL  2.  Pt will demonstrate at least 130 degrees of active L shoulder elevation in order to demonstrate improved tolerance to functional movement patterns such as reaching overhead.  Baseline: see ROM chart above Goal status: INITIAL  3.  Pt will demonstrate at least 4+/5 shoulder flex/abd MMT for improved symmetry of UE strength and improved tolerance to functional movements.  Baseline: deferred on eval given acuity of surgery Goal status: INITIAL  4. Pt will report at least 50% decrease in overall pain levels in past week in order to facilitate improved tolerance to basic ADLs/mobility.   Baseline: 5-11.9/10  Goal status: INITIAL    5. Pt will endorse ability to mow lawn with less than 3 pt increase in pain in order to facilitate  improved tolerance to usual tasks.  Baseline: unable to perform  Goal status: INITIAL  6. Pt will endorse ability to perform usual self-care ADLs such as dressing/hygiene in order to facilitate improved return to PLOF.  Baseline: difficulty w/ upper body dressing, putting on deodorant  Goal status: INITIAL  PLAN:  PT FREQUENCY: 2x/week  PT DURATION: 8 weeks  PLANNED INTERVENTIONS: 97164- PT Re-evaluation, 97750- Physical Performance Testing, 97110-Therapeutic exercises, 97530- Therapeutic activity, [id number]- Neuromuscular re-education, 97535- Self Care, [id number]- Manual therapy, 97016- Vasopneumatic device, 79439 (1-2 muscles), 20561 (3+ muscles)- Dry Needling, Patient/Family education, Taping, Joint mobilization, Spinal mobilization, Scar mobilization, Cryotherapy, and Moist heat  PLAN FOR NEXT SESSION: Review/update HEP PRN. Work on  [facility] PROM/AAROM flexion/scaption within pt tolerance. Unresisted periscapular activation. No formal restrictions/protocol but would limit abduction and horizontal adduction first 6 weeks post op given DCE/SAD. Symptom modification strategies as indicated/appropriate.    Lamarr Price, PTA 02/02/2024 11:02 AM

## 2024-02-07 ENCOUNTER — Encounter: Payer: Self-pay | Admitting: Physical Therapy

## 2024-02-07 ENCOUNTER — Ambulatory Visit: Admitting: Physical Therapy

## 2024-02-07 DIAGNOSIS — M25512 Pain in left shoulder: Secondary | ICD-10-CM

## 2024-02-07 DIAGNOSIS — R600 Localized edema: Secondary | ICD-10-CM

## 2024-02-07 DIAGNOSIS — M6281 Muscle weakness (generalized): Secondary | ICD-10-CM

## 2024-02-07 NOTE — Therapy (Signed)
 OUTPATIENT PHYSICAL THERAPY SHOULDER TREATMENT   Patient Name: Gregory West. MRN: [id number] DOB:05-Oct-1950, 73 y.o., male Today's Date: 02/07/2024  END OF SESSION:  PT End of Session - 02/07/24 1316     Visit Number 6    Number of Visits 17    Date for PT Re-Evaluation 03/13/24    [facility]ization Type [facility]    Authorization - Visit Number 6    Authorization - Number of Visits 15    Progress Note Due on Visit 10    PT Start Time 1317    PT Stop Time 1355    PT Time Calculation (min) 38 min          Past Medical History:  Diagnosis Date   Allergy not sure   Arthritis    Chest pain    a. Normal cors 2001. b. Neg stress test 2012, 05/2014.   Diabetes mellitus without complication (HCC)    GERD (gastroesophageal reflux disease)    Hyperlipidemia    Hypertension    Hypertensive response to exercise    Hypothyroidism    Obesity    Obstructive sleep apnea    Currently untreated    PONV (postoperative nausea and vomiting)    Sleep apnea    +cpap   Ulcer of left lower extremity (HCC) 12/16/2022   Past Surgical History:  Procedure Laterality Date   CARDIAC CATHETERIZATION     CERVICAL FUSION     HERNIA REPAIR     KNEE ARTHROSCOPY  1984   both knee   [facility]   POSTERIOR LUMBAR FUSION 2 WITH HARDWARE REMOVAL Left 01/04/2024   Procedure: ARTHROSCOPY, SHOULDER WITH DEBRIDEMENT;  Surgeon: Jerri Kay HERO, MD;  Location: [facility];  Service: Orthopedics;  Laterality: Left;  LEFT SHOULDER ARTHROSCOPY, EXTENSIVE DEBRIDEMENT, DISTAL CLAVICLE EXCISION, SUBACROMIAL DECOMPRESSION   SHOULDER ARTHROSCOPY WITH SUBACROMIAL DECOMPRESSION Right 09/13/2019   Procedure: RIGHT SHOULDER ARTHROSCOPY WITH EXTENSIVE DEBRIDEMENT, SUBACROMIAL DECOMPRESSION,;  Surgeon: Vernetta Lonni GRADE, MD;  Location: [facility];  Service: Orthopedics;  Laterality: Right;   SPINE SURGERY  1st Sept 1977   2nd Feb 2011   Patient Active Problem  List   Diagnosis Date Noted   Arthritis of left acromioclavicular joint 01/04/2024   Diabetic retinopathy (HCC) 12/29/2023   On long term drug therapy 10/27/2023   Pure hypercholesterolemia 10/27/2023   Viral URI 06/28/2023   Splinter in skin 03/08/2023   COVID 02/02/2023   Bee sting reaction 01/11/2023   Ulcer of left lower extremity (HCC) 12/16/2022   Bilateral hand numbness 10/06/2022   Atypical pigmented lesion 09/29/2022   ED (erectile dysfunction) 09/29/2022   Fusion of spine of cervical region 09/14/2022   Bilateral third flexor stenosing tenosynovitis and first carpometacarpal osteoarthritis. 10/30/2021   Exercise counseling 07/29/2021   Other specified counseling 07/29/2021   Primary open-angle glaucoma, bilateral, mild stage 07/29/2021   Diabetic neuropathy (HCC) 07/31/2020   Abdominal aortic aneurysm (HCC) 07/31/2020   Insomnia 07/31/2020   GERD (gastroesophageal reflux disease) 01/09/2020   Impingement syndrome of left shoulder 08/02/2019   Interstitial pulmonary disease (HCC) 06/29/2018   Hypertension associated with diabetes (HCC) 07/13/2017   Benign essential tremor 07/13/2017   Kidney cyst, acquired 01/24/2017   Seborrheic keratoses 04/01/2016   Diverticulosis of colon without hemorrhage 02/26/2016   Anxiety state 10/02/2015   BPH (benign prostatic hyperplasia) 07/31/2015   Carpal tunnel syndrome, left upper limb 06/04/2015   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   Vitamin  D deficiency 03/06/2015   History of pancreatitis 03/06/2015   Lumbar spondylosis 03/04/2015   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   Obstructive sleep apnea 05/15/2014   Hyperlipidemia 05/15/2014   Glaucoma suspect 10/21/2011    PCP: Alvia Bring, DO  REFERRING PROVIDER: Jerri Kay HERO, MD  REFERRING DIAG: [phone] (ICD-10-CM) - Chronic left shoulder pain M75.42 (ICD-10-CM) - Impingement syndrome of left shoulder  THERAPY DIAG:   Left shoulder pain, unspecified chronicity  Muscle weakness (generalized)  Localized edema  Rationale for Evaluation and Treatment: Rehabilitation  ONSET DATE: scope + DCE/SAD 01/04/24 L shoulder  SUBJECTIVE:                                                                                                                                                                                      SUBJECTIVE STATEMENT: 02/07/2024: States his shoulder is moving a bit better, no pain unless he moves it wrong. Less pain, will resolve as soon as he stops movement. Hasn't used sling since Saturday. Reports some GI distress from change in medication, states he was told to expect side effects but is planning to reach out to provider today to discuss.    EVAL: Pt endorses chronic history with shoulder injuries, very active in sports, athletics, and [employer]. Worsening over past year prior to surgery. States since surgery pain has been steadily improving, is having difficulty w/ self care tasks, household tasks. Not performing heavier tasks around yard or home. Reports a bit of swelling in shoulder as expected, and bruising that is improving. States surgical follow up went well, reports being advised sling use PRN for comfort and no lifting 5# or greater. States yesterday he did remove one of his steri strips and it had some mild bleeding initially but no issues since. Denies constitutional symptoms (fevers, chills, SOB, etc). Has a bit of tingling in L lateral palm which he states is chronic, unchanged since surgery. Hand dominance: Right  PERTINENT HISTORY: DM, hx chest pain, cervical fusion, lumbar fusion (pt denies), HTN, hypothyroidism, LLE ulcer, R shoulder scope 2021, DM2, essential tremor, diabetic retinopathy Self reports aortic aneurysm, describes as stable and follows with cardiology  PAIN:  Are you having pain: 6/10 pain with reaching, none at rest  Per eval:  Location/description: pinching in front  of shoulder, posterior shoulder Best-worst over past week: 5-11.9/10  - aggravating factors: difficulty positioning while sitting, use of arm, picking up pots/pans, opening jar - Easing factors: sling use, icing, posiitoning    PRECAUTIONS: s/p shoulder scope, SAD, DCE (no formal restrictions on referral).   RED FLAGS: No fevers/chills, no SOB or other constitutional symptoms  WEIGHT  BEARING RESTRICTIONS: No  FALLS:  Has patient fallen in last 6 months? 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  DIAGNOSTIC FINDINGS:  S/p L shoulder debridement, SAD, DCE 01/04/24  PATIENT SURVEYS:  QuickDASH: 70.45%   COGNITION: Overall cognitive status: Within functional limits for tasks assessed     SENSATION: Numbness L hand, chronic and stable, unchanging since surgery per pt report  EDEMA/INSPECTION:  Incisions well appearing without drainage or erythema, steri strips over posterior and lateral incisions, anterior does not have steri strip but no apparent opening of incision ; gross edema apparent about L GH joint WNL; mild bruising upper arm which pt states is improving since surgery  UPPER EXTREMITY ROM:  A/PROM Right eval Left eval Left 01/24/24 Left 01/31/24 L 02/07/24  Shoulder flexion A: 128 deg 91 deg passively A: 99 deg, mild pain on way down A: 120 deg  A: 125 deg  Shoulder abduction       Shoulder internal rotation       Shoulder external rotation       Elbow flexion       Elbow extension       Wrist flexion       Wrist extension         (Blank rows = not tested) (Key: WFL = within functional limits not formally assessed, * = concordant pain, s = stiffness/stretching sensation, NT = not tested)  Comments:    UPPER EXTREMITY MMT:  MMT Right eval Left eval 01/31/24 R / L  Shoulder flexion     Shoulder extension     Shoulder abduction     Shoulder extension     Shoulder internal rotation     Shoulder external rotation     Elbow flexion     Elbow extension     Grip strength 55# 65# 55# / 80#  (Blank rows = not tested)  (Key: WFL = within functional limits not formally assessed, * = concordant pain, s = stiffness/stretching sensation, NT = not tested)  Comments: MMT deferred given acuity of surgery   PALPATION:  TTP L UT, LS, rhomboid                                                                                                                             [facility] Adult PT Treatment:  DATE: 02/07/24 Therapeutic Exercise: Swiss ball up wall x8 cues for comfortable ROM RB scap retraction isos, walk back x10 cues for posture and shoulder positioning HEP discussion/education  Therapeutic Activity: Finger ladder x10 for reaching tolerance (up to 26) for reaching tolerance Seated shoulder flexion AROM <90 deg 3x8 cues for posture and pacing Seated scaption AROM <90 deg 2x8 cues for comfortable ROM and posture  Continuing to reinforce post op healing/protocol with activity modification    [facility] Adult PT Treatment:                                                DATE: 02/02/2024 Therapeutic Exercise: Self-massage with green spiky ball --> posterior shoulder Self-massage with yellow spiky ball --> pecs Neuromuscular re-ed: Shoulder shrugs + shoulder depression focus Shoulder flexion finger ladder Wall slides --> flexion; attempted 1#DB but discontinued d/t mild pain on eccentric phase Therapeutic Activity: Standing:  Shoulder flexion & scaption rolling red PB on table Small  circles flexion --> scaption with red PB Pulleys --> flexion x 2 min Supine shoulder flexion AAROM (with opp hand) Modalities: Vaso (Lt) shoulder, med compression, 34 degrees x 15 min    [facility] Adult PT Treatment:                                                DATE: 01/31/2024 Therapeutic Exercise: Shoulder AROM flexion measurements (see above) Grip strength measurement (see above) Seated UT/LS/scalenes stretches  Neuromuscular re-ed: Standing with back against noodle for postural awareness:  Scapula retraction 2x10x5 Shoulder shrug x10 w/ cues for motor control and UT relaxation at bottom of movement Bkwd shoulder circles (shrug + scap retraction) 2x10 Therapeutic Activity: Shoulder flexion PROM with walk-out at counter -> discontinued d/t pain Standing shoulder flexion AAROM with red PB  Supine shoulder flexion AAROM (assist from opp hand) Seated scaption arm slide on table  Seated shoulder AROM within pain-free range x 5 Modalities: Vaso (Lt) shoulder, 34 degrees low compression x 10 min   TREATMENT DATE:  [facility] Adult PT Treatment:                                                DATE: 01/24/24 Therapeutic Exercise: Standing swiss ball flexion at table; 2x15 cues for comfortable ROM Supine shoulder flexion AROM <90 deg, discontinued at 5 reps d/t pain Seated passive shoulder flexion (LE support) x8 HEP discussion/education  Neuromuscular re-ed: Scapular retraction 5 sec hold, 2x10 cues for posture, wall as external cue Shoulder shrug x10 w/ cues for motor control and UT relaxation at bottom of movement  Self Care: Icing regimen, activity/positional modification (limiting horizontal adduction/abduction for symptom irritability), monitoring surgical site    [facility] Adult PT Treatment:                                                DATE: 01/18/2024 Therapeutic Exercise: Shoulder flexion stretch step out at high table --> discontinued d/t pain Shoulder flexion stretch with  orange  PB (standing) Manual Therapy:  Shoulder PROM flexion & scaption to tolerance with stretch at end range Neuromuscular re-ed: Seated with noodle --> scapula retraction 10x5 Seated table slides --> flexion & scaption x10 each Modalities: Vaso (Lt) shoulder, 34 degrees low compression x 10 min    PATIENT EDUCATION: Education details: Updated HEP  Person educated: Patient Education method: Explanation, Demonstration, Tactile cues, Verbal cues Education comprehension: verbalized understanding, returned demonstration, verbal cues required, tactile cues required, and needs further education      HOME EXERCISE PROGRAM: Access Code: [email or web address] URL: [email or web address] Date: 02/07/2024 (NO DB FOR FLEXION) Prepared by: Alm Jenny  Exercises - Seated Shoulder Flexion Towel Slide at Table Top  - 2-3 x daily - 1 sets - 8-10 reps - Seated Gripping Towel  - 2-3 x daily - 1 sets - 8-10 reps - Seated Scapular Retraction  - 2-3 x daily - 1 sets - 8-10 reps - 5 sec hold - Seated Shoulder Scaption Slide at Table Top with Forearm in Neutral  - 2-3 x daily - 1 sets - 8-10 reps - Standing Shoulder Flexion to 90 Degrees with Dumbbells  - 2-3 x daily - 1 sets - 8-10 reps  ASSESSMENT:  CLINICAL IMPRESSION:  02/07/2024: Pt arrives w/ report of progressing shoulder mobility, less pain. Modest improvement noted in L shoulder flexion AROM and approaching symmetrical with other side. Today continuing to work on comfortable AAROM/AROM in flexion and scapular plane per protocol. Tolerates well without adverse events or increase in resting pain, cues as above. Recommend continuing along current POC in order to address relevant deficits and improve functional tolerance. Pt departs today's session in no acute distress, all voiced questions/concerns addressed appropriately from PT perspective.      EVAL: Patient is a 73 y.o. gentleman who was seen today for physical therapy evaluation and treatment for  L shoulder s/p debridement, SAD, DCE 01/04/24. No red flags or signs of post op complications today, did encourage monitoring of his incision as he states he removed anterior steri strip yesterday. Endorses difficulty with household tasks and limiting heavier tasks as expected post op. On exam he demonstrates limitations in passive mobility secondary to transient pain. Tolerates exam/HEP well overall without adverse event, HEP and education as above. Recommend trial of skilled PT to address aforementioned deficits with aim of improving functional tolerance and reducing pain with typical activities. Pt departs today's session in no acute distress, all voiced concerns/questions addressed appropriately from PT perspective.      OBJECTIVE IMPAIRMENTS: decreased activity tolerance, decreased endurance, decreased mobility, decreased ROM, decreased strength, impaired perceived functional ability, impaired UE functional use, improper body mechanics, postural dysfunction, and pain.   ACTIVITY LIMITATIONS: carrying, lifting, dressing, reach over head, and hygiene/grooming  PARTICIPATION LIMITATIONS: meal prep, cleaning, laundry, driving, shopping, and community activity  PERSONAL FACTORS: Age, Time since onset of injury/illness/exacerbation, and 3+ comorbidities: hx chest pain, cervical fusion, lumbarfusion, HTN, hypothyroidism, LLE ulcer, R shoulder scope 2021, DM2, essential tremor, diabetic retinopathy are also affecting patient's functional outcome.   REHAB POTENTIAL: Good  CLINICAL DECISION MAKING: Stable/uncomplicated  EVALUATION COMPLEXITY: Low   GOALS:  SHORT TERM GOALS: Target date: 02/14/2024  Pt will demonstrate appropriate understanding and performance of initially prescribed HEP in order to facilitate improved independence with management of symptoms.  Baseline: HEP established  Goal status: INITIAL   2. Pt will report at least 25% improvement in overall pain levels over past week in order  to facilitate improved tolerance to typical  daily activities.   Baseline: 5-11.910  Goal status: INITIAL     LONG TERM GOALS: Target date: 03/13/2024   Pt will score less than or equal to 50% on Quick DASH in order to indicate reduced levels of disability due to shoulder pain (MDC 16-20pts).  Baseline: 70.45%   Goal status: INITIAL  2.  Pt will demonstrate at least 130 degrees of active L shoulder elevation in order to demonstrate improved tolerance to functional movement patterns such as reaching overhead.  Baseline: see ROM chart above Goal status: INITIAL  3.  Pt will demonstrate at least 4+/5 shoulder flex/abd MMT for improved symmetry of UE strength and improved tolerance to functional movements.  Baseline: deferred on eval given acuity of surgery Goal status: INITIAL  4. Pt will report at least 50% decrease in overall pain levels in past week in order to facilitate improved tolerance to basic ADLs/mobility.   Baseline: 5-11.9/10  Goal status: INITIAL    5. Pt will endorse ability to mow lawn with less than 3 pt increase in pain in order to facilitate improved tolerance to usual tasks.  Baseline: unable to perform  Goal status: INITIAL  6. Pt will endorse ability to perform usual self-care ADLs such as dressing/hygiene in order to facilitate improved return to PLOF.  Baseline: difficulty w/ upper body dressing, putting on deodorant  Goal status: INITIAL  PLAN:  PT FREQUENCY: 2x/week  PT DURATION: 8 weeks  PLANNED INTERVENTIONS: 97164- PT Re-evaluation, 97750- Physical Performance Testing, 97110-Therapeutic exercises, 97530- Therapeutic activity, [id number]- Neuromuscular re-education, 97535- Self Care, 02859- Manual therapy, 97016- Vasopneumatic device, 79439 (1-2 muscles), 20561 (3+ muscles)- Dry Needling, Patient/Family education, Taping, Joint mobilization, Spinal mobilization, Scar mobilization, Cryotherapy, and Moist heat  PLAN FOR NEXT SESSION: Review/update HEP PRN. Work  on  [facility] PROM/AAROM flexion/scaption within pt tolerance. Unresisted periscapular activation. No formal restrictions/protocol but would limit abduction and horizontal adduction first 6 weeks post op given DCE/SAD. Symptom modification strategies as indicated/appropriate.    Alm DELENA Jenny PT, DPT 02/07/2024 1:58 PM

## 2024-02-08 ENCOUNTER — Ambulatory Visit (INDEPENDENT_AMBULATORY_CARE_PROVIDER_SITE_OTHER): Admitting: Physician Assistant

## 2024-02-08 DIAGNOSIS — Z9889 Other specified postprocedural states: Secondary | ICD-10-CM

## 2024-02-08 NOTE — Progress Notes (Signed)
 Post-Op Visit Note   Patient: Gregory FOTI [name]           Date of Birth: [birth year]           MRN: [id number] Visit Date: 02/08/2024 PCP: Alvia Bring, DO   Assessment & Plan:  Chief Complaint:  Chief Complaint  Patient presents with   Left Shoulder - Follow-up    Left shoulder scope 01/04/2024   Visit Diagnoses:  1. History of arthroscopy of left shoulder     Plan: Patient is a pleasant 73 year old gentleman who comes in today approximately 5 weeks status post left shoulder arthroscopic debridement, decompression, date of surgery 01/04/2024.  He does have some pain and trouble with bring his arm across his body as well as with internal rotation but overall, making progress.  He is not taking anything for pain.  Examination of his left shoulder: Forward flexion to 120 degrees.  External rotation to about 45 degrees.  Internal rotation to his back pocket.  He is neurovascular intact distally.  At this point, would like for him to continue pushing things with therapy.  Follow-up with us  in 6 weeks for recheck.  Call with concerns or questions.   Follow-Up Instructions: Return in about 6 weeks (around 03/21/2024).   Orders:  No orders of the defined types were placed in this encounter.  No orders of the defined types were placed in this encounter.   Imaging: No new imaging  PMFS History: Patient Active Problem List   Diagnosis Date Noted   Arthritis of left acromioclavicular joint 01/04/2024   Diabetic retinopathy (HCC) 12/29/2023   On long term drug therapy 10/27/2023   Pure hypercholesterolemia 10/27/2023   Viral URI 06/28/2023   Splinter in skin 03/08/2023   COVID 02/02/2023   Bee sting reaction 01/11/2023   Ulcer of left lower extremity (HCC) 12/16/2022   Bilateral hand numbness 10/06/2022   Atypical pigmented lesion 09/29/2022   ED (erectile dysfunction) 09/29/2022   Fusion of spine of cervical region 09/14/2022   Bilateral third flexor stenosing  tenosynovitis and first carpometacarpal osteoarthritis. 10/30/2021   Exercise counseling 07/29/2021   Other specified counseling 07/29/2021   Primary open-angle glaucoma, bilateral, mild stage 07/29/2021   Diabetic neuropathy (HCC) 07/31/2020   Abdominal aortic aneurysm (HCC) 07/31/2020   Insomnia 07/31/2020   GERD (gastroesophageal reflux disease) 01/09/2020   Impingement syndrome of left shoulder 08/02/2019   Interstitial pulmonary disease (HCC) 06/29/2018   Hypertension associated with diabetes (HCC) 07/13/2017   Benign essential tremor 07/13/2017   Kidney cyst, acquired 01/24/2017   Seborrheic keratoses 04/01/2016   Diverticulosis of colon without hemorrhage 02/26/2016   Anxiety state 10/02/2015   BPH (benign prostatic hyperplasia) 07/31/2015   Carpal tunnel syndrome, left upper limb 06/04/2015   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 Normal cors 2001. b. Neg stress test 2012, 05/2014.   Diabetes mellitus without complication (HCC)    GERD (gastroesophageal reflux disease)    Hyperlipidemia    Hypertension    Hypertensive response to exercise    Hypothyroidism    Obesity    Obstructive sleep apnea  Currently untreated    PONV (postoperative nausea and vomiting)    Sleep apnea    +cpap   Ulcer of left lower extremity (HCC) 12/16/2022    Family History  Problem Relation Age of Onset   Diabetes Mother    Heart disease Mother    Hypertension Mother    Stroke Mother    Heart attack Mother    COPD Mother    Cancer Father    Heart disease Father    Diabetes Father    Hyperlipidemia Sister     Hypertension Sister    Diabetes Maternal Grandmother    Stroke Maternal Grandmother    Heart attack Maternal Grandmother    Heart disease Maternal Grandmother    Diabetes Maternal Grandfather    Hypertension Maternal Grandfather    Heart attack Maternal Grandfather    Heart disease Maternal Grandfather    Diabetes Sister    Hypertension Sister    Cancer Sister    Hyperlipidemia Sister    Heart attack Paternal Grandmother    Heart attack Paternal Grandfather    Cancer Sister    Hyperlipidemia Sister    COPD Maternal Aunt    Hypertension Maternal Aunt    Diabetes Son    Diabetes Maternal Aunt     Past Surgical History:  Procedure Laterality Date   CARDIAC CATHETERIZATION     CERVICAL FUSION     HERNIA REPAIR     KNEE ARTHROSCOPY  1984   both knee   [facility]   POSTERIOR LUMBAR FUSION 2 WITH HARDWARE REMOVAL Left 01/04/2024   Procedure: ARTHROSCOPY, SHOULDER WITH DEBRIDEMENT;  Surgeon: Jerri Kay HERO, MD;  Location: [facility];  Service: Orthopedics;  Laterality: Left;  LEFT SHOULDER ARTHROSCOPY, EXTENSIVE DEBRIDEMENT, DISTAL CLAVICLE EXCISION, SUBACROMIAL DECOMPRESSION   SHOULDER ARTHROSCOPY WITH SUBACROMIAL DECOMPRESSION Right 09/13/2019   Procedure: RIGHT SHOULDER ARTHROSCOPY WITH EXTENSIVE DEBRIDEMENT, SUBACROMIAL DECOMPRESSION,;  Surgeon: Vernetta Lonni GRADE, MD;  Location: [facility];  Service: Orthopedics;  Laterality: Right;   SPINE SURGERY  1st Sept 1977   2nd Feb 2011   Social History   Occupational History    Comment: Retired  Tobacco Use   Smoking status: Former    Current packs/day: 0.00    Average packs/day: 1.5 packs/day for 30.0 years (45.0 ttl pk-yrs)    Types: Cigarettes    Start date: 06/01/1963    Quit date: 05/31/1993    Years since quitting: 30.7   Smokeless tobacco: Never   Tobacco comments:    24 years smoke free and alcohol free  Vaping Use   Vaping status: Never Used  Substance and  Sexual Activity   Alcohol use: No   Drug use: No   Sexual activity: Yes    Birth control/protection: None    Comment: Not Necessary

## 2024-02-09 ENCOUNTER — Encounter: Payer: Self-pay | Admitting: Physical Therapy

## 2024-02-09 ENCOUNTER — Ambulatory Visit: Admitting: Physical Therapy

## 2024-02-09 DIAGNOSIS — M25512 Pain in left shoulder: Secondary | ICD-10-CM | POA: Diagnosis not present

## 2024-02-09 DIAGNOSIS — R600 Localized edema: Secondary | ICD-10-CM

## 2024-02-09 DIAGNOSIS — M6281 Muscle weakness (generalized): Secondary | ICD-10-CM

## 2024-02-09 NOTE — Therapy (Signed)
 OUTPATIENT PHYSICAL THERAPY SHOULDER TREATMENT   Patient Name: Gregory YEPIZ [name] MRN: [id number] DOB:1951/04/19, 73 y.o., male Today's Date: 02/09/2024  END OF SESSION:  PT End of Session - 02/09/24 1016     Visit Number 7    Number of Visits 17    Date for PT Re-Evaluation 03/13/24    Authorization Type [facility]    Authorization - Visit Number 7    Authorization - Number of Visits 15    Progress Note Due on Visit 10    PT Start Time 1016    PT Stop Time 1100   vaso   PT Time Calculation (min) 44 min           Past Medical History:  Diagnosis Date   Allergy not sure   Arthritis    Chest pain    a. Normal cors 2001. b. Neg stress test 2012, 05/2014.   Diabetes mellitus without complication (HCC)    GERD (gastroesophageal reflux disease)    Hyperlipidemia    Hypertension    Hypertensive response to exercise    Hypothyroidism    Obesity    Obstructive sleep apnea    Currently untreated    PONV (postoperative nausea and vomiting)    Sleep apnea    +cpap   Ulcer of left lower extremity (HCC) 12/16/2022   Past Surgical History:  Procedure Laterality Date   CARDIAC CATHETERIZATION     CERVICAL FUSION     HERNIA REPAIR     KNEE ARTHROSCOPY  1984   both knee   [facility]   POSTERIOR LUMBAR FUSION 2 WITH HARDWARE REMOVAL Left 01/04/2024   Procedure: ARTHROSCOPY, SHOULDER WITH DEBRIDEMENT;  Surgeon: Jerri Kay HERO, MD;  Location: [facility];  Service: Orthopedics;  Laterality: Left;  LEFT SHOULDER ARTHROSCOPY, EXTENSIVE DEBRIDEMENT, DISTAL CLAVICLE EXCISION, SUBACROMIAL DECOMPRESSION   SHOULDER ARTHROSCOPY WITH SUBACROMIAL DECOMPRESSION Right 09/13/2019   Procedure: RIGHT SHOULDER ARTHROSCOPY WITH EXTENSIVE DEBRIDEMENT, SUBACROMIAL DECOMPRESSION,;  Surgeon: Vernetta Lonni GRADE, MD;  Location: [facility];  Service: Orthopedics;  Laterality: Right;   SPINE SURGERY  1st Sept 1977   2nd Feb 2011   Patient  Active Problem List   Diagnosis Date Noted   Arthritis of left acromioclavicular joint 01/04/2024   Diabetic retinopathy (HCC) 12/29/2023   On long term drug therapy 10/27/2023   Pure hypercholesterolemia 10/27/2023   Viral URI 06/28/2023   Splinter in skin 03/08/2023   COVID 02/02/2023   Bee sting reaction 01/11/2023   Ulcer of left lower extremity (HCC) 12/16/2022   Bilateral hand numbness 10/06/2022   Atypical pigmented lesion 09/29/2022   ED (erectile dysfunction) 09/29/2022   Fusion of spine of cervical region 09/14/2022   Bilateral third flexor stenosing tenosynovitis and first carpometacarpal osteoarthritis. 10/30/2021   Exercise counseling 07/29/2021   Other specified counseling 07/29/2021   Primary open-angle glaucoma, bilateral, mild stage 07/29/2021   Diabetic neuropathy (HCC) 07/31/2020   Abdominal aortic aneurysm (HCC) 07/31/2020   Insomnia 07/31/2020   GERD (gastroesophageal reflux disease) 01/09/2020   Impingement syndrome of left shoulder 08/02/2019   Interstitial pulmonary disease (HCC) 06/29/2018   Hypertension associated with diabetes (HCC) 07/13/2017   Benign essential tremor 07/13/2017   Kidney cyst, acquired 01/24/2017   Seborrheic keratoses 04/01/2016   Diverticulosis of colon without hemorrhage 02/26/2016   Anxiety state 10/02/2015   BPH (benign prostatic hyperplasia) 07/31/2015   Carpal tunnel syndrome, left upper limb 06/04/2015   Hypogonadotropic hypogonadism (HCC)  03/06/2015   Vitamin D  deficiency 03/06/2015   History of pancreatitis 03/06/2015   Lumbar spondylosis 03/04/2015   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   Obstructive sleep apnea 05/15/2014   Hyperlipidemia 05/15/2014   Glaucoma suspect 10/21/2011    PCP: Alvia Bring, DO  REFERRING PROVIDER: Jerri Kay HERO, MD  REFERRING DIAG: [phone] (ICD-10-CM) - Chronic left shoulder pain M75.42 (ICD-10-CM) - Impingement syndrome of left  shoulder  THERAPY DIAG:  Left shoulder pain, unspecified chronicity  Muscle weakness (generalized)  Localized edema  Rationale for Evaluation and Treatment: Rehabilitation  ONSET DATE: scope + DCE/SAD 01/04/24 L shoulder  SUBJECTIVE:                                                                                                                                                                                      SUBJECTIVE STATEMENT: 02/09/2024: states he spoke w/ his provider about his side effects and is feeling better today. Saw surgeon yesterday, no concerns, following back up in 6 weeks. Having a bit more pain today, might have slept wrong last night. No other provocative factors. Felt good after last session.     EVAL: Pt endorses chronic history with shoulder injuries, very active in sports, athletics, and [employer]. Worsening over past year prior to surgery. States since surgery pain has been steadily improving, is having difficulty w/ self care tasks, household tasks. Not performing heavier tasks around yard or home. Reports a bit of swelling in shoulder as expected, and bruising that is improving. States surgical follow up went well, reports being advised sling use PRN for comfort and no lifting 5# or greater. States yesterday he did remove one of his steri strips and it had some mild bleeding initially but no issues since. Denies constitutional symptoms (fevers, chills, SOB, etc). Has a bit of tingling in L lateral palm which he states is chronic, unchanged since surgery. Hand dominance: Right  PERTINENT HISTORY: DM, hx chest pain, cervical fusion, lumbar fusion (pt denies), HTN, hypothyroidism, LLE ulcer, R shoulder scope 2021, DM2, essential tremor, diabetic retinopathy Self reports aortic aneurysm, describes as stable and follows with cardiology  PAIN:  Are you having pain: 6/10 resting pain, 8/10 with reaching  Per eval:  Location/description: pinching in front of  shoulder, posterior shoulder Best-worst over past week: 5-11.9/10  - aggravating factors: difficulty positioning while sitting, use of arm, picking up pots/pans, opening jar - Easing factors: sling use, icing, posiitoning    PRECAUTIONS: s/p shoulder scope, SAD, DCE (no formal restrictions on referral).   RED FLAGS: No fevers/chills, no SOB or other constitutional symptoms  WEIGHT BEARING RESTRICTIONS: No  FALLS:  Has patient fallen in last 6 months? 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  DIAGNOSTIC FINDINGS:  S/p L shoulder debridement, SAD, DCE 01/04/24  PATIENT SURVEYS:  QuickDASH: 70.45%   COGNITION: Overall cognitive status: Within functional limits for tasks assessed     SENSATION: Numbness L hand, chronic and stable, unchanging since surgery per pt report  EDEMA/INSPECTION:  Incisions well appearing without drainage or erythema, steri strips over posterior and lateral incisions, anterior does not have steri strip but no apparent opening of incision ; gross edema apparent about L GH joint WNL; mild bruising upper arm which pt states is improving since surgery  UPPER EXTREMITY ROM:  A/PROM Right eval Left eval Left 01/24/24 Left 01/31/24 L 02/07/24 L 02/09/24  Shoulder flexion A: 128 deg 91 deg passively A: 99 deg, mild pain on way down A: 120 deg  A: 125 deg AA: 134 deg  A: 135 deg *  Shoulder abduction        Shoulder internal rotation        Shoulder external rotation        Elbow flexion        Elbow extension        Wrist  flexion        Wrist extension         (Blank rows = not tested) (Key: WFL = within functional limits not formally assessed, * = concordant pain, s = stiffness/stretching sensation, NT = not tested)  Comments:    UPPER EXTREMITY MMT:  MMT Right eval Left eval 01/31/24 R / L  Shoulder flexion     Shoulder extension     Shoulder abduction     Shoulder extension     Shoulder internal rotation     Shoulder external rotation     Elbow flexion     Elbow extension     Grip strength 55# 65# 55# / 80#  (Blank rows = not tested)  (Key: WFL = within functional limits not formally assessed, * = concordant pain, s = stiffness/stretching sensation, NT = not tested)  Comments: MMT deferred given acuity of surgery   PALPATION:  TTP L UT, LS, rhomboid                                                                                                                             [facility] Adult PT Treatment:  DATE: 02/09/24 Therapeutic Exercise: Swiss ball up wall 2x10 cues for comfortable ROM Green band iso scap retraction 2x10 BIL HEP update + education/discussion  Neuromuscular re-ed: Ulnar nerve glides (modified, supination/pronation only with elbow at side) x8 ; education on appropriate use PRN   Therapeutic Activity: Seated shoulder flexion AROM ~90 deg, 2x10; performed BIL for reaching tolerance, cues for posture and comfortable ROM Seated shoulder scaption AROM ~90 deg 2x12 cues for posture  Modalities: Seated; L shoulder, vaso low compression 34 deg; pillow support    [facility] Adult PT Treatment:                                                DATE: 02/07/24 Therapeutic Exercise: Swiss ball up wall x8 cues for comfortable ROM RB scap retraction isos, walk back x10 cues for posture and shoulder positioning HEP discussion/education  Therapeutic Activity: Finger ladder x10 for reaching tolerance (up to 26) for reaching tolerance Seated shoulder  flexion AROM <90 deg 3x8 cues for posture and pacing Seated scaption AROM <90 deg 2x8 cues for comfortable ROM and posture  Continuing to reinforce post op healing/protocol with activity modification    [facility] Adult PT Treatment:                                                DATE: 02/02/2024 Therapeutic Exercise: Self-massage with green spiky ball --> posterior shoulder Self-massage with yellow spiky ball --> pecs Neuromuscular re-ed: Shoulder shrugs + shoulder depression focus Shoulder flexion finger ladder Wall slides --> flexion; attempted 1#DB but discontinued d/t mild pain on eccentric phase Therapeutic Activity: Standing:  Shoulder flexion & scaption rolling red PB on table Small circles flexion --> scaption with red PB Pulleys --> flexion x 2 min Supine shoulder flexion AAROM (with opp hand) Modalities: Vaso (Lt) shoulder, med compression, 34 degrees x 15 min    [facility] Adult PT Treatment:                                                DATE: 01/31/2024 Therapeutic Exercise: Shoulder AROM flexion measurements (see above) Grip strength measurement (see above) Seated UT/LS/scalenes stretches  Neuromuscular re-ed: Standing with back against noodle for postural awareness:  Scapula retraction 2x10x5 Shoulder shrug x10 w/ cues for motor control and UT relaxation at bottom of movement Bkwd shoulder circles (shrug + scap retraction) 2x10 Therapeutic Activity: Shoulder flexion PROM with walk-out at counter -> discontinued d/t pain Standing shoulder flexion AAROM with red PB  Supine shoulder flexion AAROM (assist from opp hand) Seated scaption arm slide on table  Seated shoulder AROM within pain-free range x 5 Modalities: Vaso (Lt) shoulder, 34 degrees low compression x 10 min    PATIENT EDUCATION: Education details: Updated HEP  Person educated: Patient Education method: Explanation, Demonstration, Tactile cues, Verbal cues Education comprehension: verbalized understanding,  returned demonstration, verbal cues required, tactile cues required, and needs further education      HOME EXERCISE PROGRAM: Access Code: [email or web address] URL: [email or web address] Date: 02/09/2024 Prepared by: Alm Jenny  Exercises - Seated Shoulder Flexion Towel Slide at Table Top  - 2-3  x daily - 1 sets - 8-10 reps - Seated Gripping Towel  - 2-3 x daily - 1 sets - 8-10 reps - Seated Scapular Retraction  - 2-3 x daily - 1 sets - 8-10 reps - 5 sec hold - Seated Shoulder Scaption Slide at Table Top with Forearm in Neutral  - 2-3 x daily - 1 sets - 8-10 reps - Standing Shoulder Flexion to 90 Degrees with Dumbbells  - 2-3 x daily - 1 sets - 8-10 reps - Standing Shoulder Row Reactive Isometric  - 2-3 x daily - 1 sets - 10 reps  ASSESSMENT:  CLINICAL IMPRESSION:  02/09/2024: Arrives w/ increased pain but notes surgeon follow up yesterday went well, no concerns. States GI distress has resolved. His A/AAROM continues to improve, and shoulder pain improves as session goes on (6/10 to 3/10 by end of session). He does endorse some tingling in hand, ulnar distribution, that he states is chronic from prior to surgery but is transiently provoked with AROM initially; this resolves w/ modified nerve glides. Vaso at end of session given increased pain on arrival. Tolerates session well overall with no adverse events, rest breaks to mitigate symptoms/fatigue. Recommend continuing along current POC in order to address relevant deficits and improve functional tolerance.Pt departs today's session in no acute distress, all voiced questions/concerns addressed appropriately from PT perspective.      EVAL: Patient is a 73 y.o. gentleman who was seen today for physical therapy evaluation and treatment for L shoulder s/p debridement, SAD, DCE 01/04/24. No red flags or signs of post op complications today, did encourage monitoring of his incision as he states he removed anterior steri strip yesterday. Endorses  difficulty with household tasks and limiting heavier tasks as expected post op. On exam he demonstrates limitations in passive mobility secondary to transient pain. Tolerates exam/HEP well overall without adverse event, HEP and education as above. Recommend trial of skilled PT to address aforementioned deficits with aim of improving functional tolerance and reducing pain with typical activities. Pt departs today's session in no acute distress, all voiced concerns/questions addressed appropriately from PT perspective.      OBJECTIVE IMPAIRMENTS: decreased activity tolerance, decreased endurance, decreased mobility, decreased ROM, decreased strength, impaired perceived functional ability, impaired UE functional use, improper body mechanics, postural dysfunction, and pain.   ACTIVITY LIMITATIONS: carrying, lifting, dressing, reach over head, and hygiene/grooming  PARTICIPATION LIMITATIONS: meal prep, cleaning, laundry, driving, shopping, and community activity  PERSONAL FACTORS: Age, Time since onset of injury/illness/exacerbation, and 3+ comorbidities: hx chest pain, cervical fusion, lumbarfusion, HTN, hypothyroidism, LLE ulcer, R shoulder scope 2021, DM2, essential tremor, diabetic retinopathy are also affecting patient's functional outcome.   REHAB POTENTIAL: Good  CLINICAL DECISION MAKING: Stable/uncomplicated  EVALUATION COMPLEXITY: Low   GOALS:  SHORT TERM GOALS: Target date: 02/14/2024  Pt will demonstrate appropriate understanding and performance of initially prescribed HEP in order to facilitate improved independence with management of symptoms.  Baseline: HEP established  Goal status: INITIAL   2. Pt will report at least 25% improvement in overall pain levels over past week in order to facilitate improved tolerance to typical daily activities.   Baseline: 5-11.910  Goal status: INITIAL     LONG TERM GOALS: Target date: 03/13/2024   Pt will score less than or equal to 50% on  Quick DASH in order to indicate reduced levels of disability due to shoulder pain (MDC 16-20pts).  Baseline: 70.45%   Goal status: INITIAL  2.  Pt will demonstrate at least  130 degrees of active L shoulder elevation in order to demonstrate improved tolerance to functional movement patterns such as reaching overhead.  Baseline: see ROM chart above Goal status: INITIAL  3.  Pt will demonstrate at least 4+/5 shoulder flex/abd MMT for improved symmetry of UE strength and improved tolerance to functional movements.  Baseline: deferred on eval given acuity of surgery Goal status: INITIAL  4. Pt will report at least 50% decrease in overall pain levels in past week in order to facilitate improved tolerance to basic ADLs/mobility.   Baseline: 5-11.9/10  Goal status: INITIAL    5. Pt will endorse ability to mow lawn with less than 3 pt increase in pain in order to facilitate improved tolerance to usual tasks.  Baseline: unable to perform  Goal status: INITIAL  6. Pt will endorse ability to perform usual self-care ADLs such as dressing/hygiene in order to facilitate improved return to PLOF.  Baseline: difficulty w/ upper body dressing, putting on deodorant  Goal status: INITIAL  PLAN:  PT FREQUENCY: 2x/week  PT DURATION: 8 weeks  PLANNED INTERVENTIONS: 97164- PT Re-evaluation, 97750- Physical Performance Testing, 97110-Therapeutic exercises, 97530- Therapeutic activity, [id number]- Neuromuscular re-education, 97535- Self Care, [id number]- Manual therapy, 97016- Vasopneumatic device, 79439 (1-2 muscles), 20561 (3+ muscles)- Dry Needling, Patient/Family education, Taping, Joint mobilization, Spinal mobilization, Scar mobilization, Cryotherapy, and Moist heat  PLAN FOR NEXT SESSION: Review/update HEP PRN. Work on  [facility] PROM/AAROM flexion/scaption within pt tolerance. Unresisted periscapular activation. No formal restrictions/protocol but would limit abduction and horizontal adduction first 6 weeks post op  given DCE/SAD. Symptom modification strategies as indicated/appropriate.    Alm DELENA Jenny PT, DPT 02/09/2024 12:40 PM

## 2024-02-13 NOTE — Therapy (Signed)
 OUTPATIENT PHYSICAL THERAPY SHOULDER TREATMENT   Patient Name: Gregory West. MRN: [id number] DOB:Jan 13, 1951, 73 y.o., male Today's Date: 02/14/2024  END OF SESSION:  PT End of Session - 02/14/24 1017     Visit Number 8    Number of Visits 17    Date for PT Re-Evaluation 03/13/24    Authorization Type [facility]    Authorization - Visit Number 8    Authorization - Number of Visits 15    Progress Note Due on Visit 10    PT Start Time 1017    PT Stop Time 1052   vaso   PT Time Calculation (min) 35 min            Past Medical History:  Diagnosis Date   Allergy not sure   Arthritis    Chest pain    a. Normal cors 2001. b. Neg stress test 2012, 05/2014.   Diabetes mellitus without complication (HCC)    GERD (gastroesophageal reflux disease)    Hyperlipidemia    Hypertension    Hypertensive response to exercise    Hypothyroidism    Obesity    Obstructive sleep apnea    Currently untreated    PONV (postoperative nausea and vomiting)    Sleep apnea    +cpap   Ulcer of left lower extremity (HCC) 12/16/2022   Past Surgical History:  Procedure Laterality Date   CARDIAC CATHETERIZATION     CERVICAL FUSION     HERNIA REPAIR     KNEE ARTHROSCOPY  1984   both knee   [facility]   POSTERIOR LUMBAR FUSION 2 WITH HARDWARE REMOVAL Left 01/04/2024   Procedure: ARTHROSCOPY, SHOULDER WITH DEBRIDEMENT;  Surgeon: Jerri Kay HERO, MD;  Location: [facility];  Service: Orthopedics;  Laterality: Left;  LEFT SHOULDER ARTHROSCOPY, EXTENSIVE DEBRIDEMENT, DISTAL CLAVICLE EXCISION, SUBACROMIAL DECOMPRESSION   SHOULDER ARTHROSCOPY WITH SUBACROMIAL DECOMPRESSION Right 09/13/2019   Procedure: RIGHT SHOULDER ARTHROSCOPY WITH EXTENSIVE DEBRIDEMENT, SUBACROMIAL DECOMPRESSION,;  Surgeon: Vernetta Lonni GRADE, MD;  Location:  SUR[facility]ERY C[facility];  Service: Orthopedics;  Laterality: Right;   SPINE SURGERY  1st Sept 1977   2nd Feb 2011   Patient  Active Problem List   Diagnosis Date Noted   Arthritis of left acromioclavicular joint 01/04/2024   Diabetic retinopathy (HCC) 12/29/2023   On long term drug therapy 10/27/2023   Pure hypercholesterolemia 10/27/2023   Viral URI 06/28/2023   Splinter in skin 03/08/2023   COVID 02/02/2023   Bee sting reaction 01/11/2023   Ulcer of left lower extremity (HCC) 12/16/2022   Bilateral hand numbness 10/06/2022   Atypical pigmented lesion 09/29/2022   ED (erectile dysfunction) 09/29/2022   Fusion of spine of cervical region 09/14/2022   Bilateral third flexor stenosing tenosynovitis and first carpometacarpal osteoarthritis. 10/30/2021   Exercise counseling 07/29/2021   Other specified counseling 07/29/2021   Primary open-angle glaucoma, bilateral, mild stage 07/29/2021   Diabetic neuropathy (HCC) 07/31/2020   Abdominal aortic aneurysm (HCC) 07/31/2020   Insomnia 07/31/2020   GERD (gastroesophageal reflux disease) 01/09/2020   Impingement syndrome of left shoulder 08/02/2019   Interstitial pulmonary disease (HCC) 06/29/2018   Hypertension associated with diabetes (HCC) 07/13/2017   Benign essential tremor 07/13/2017   Kidney cyst, acquired 01/24/2017   Seborrheic keratoses 04/01/2016   Diverticulosis of colon without hemorrhage 02/26/2016   Anxiety state 10/02/2015   BPH (benign prostatic hyperplasia) 07/31/2015   Carpal tunnel syndrome, left upper limb 06/04/2015   Hypogonadotropic hypogonadism (  HCC) 03/06/2015   Vitamin D  deficiency 03/06/2015   History of pancreatitis 03/06/2015   Lumbar spondylosis 03/04/2015   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   Obstructive sleep apnea 05/15/2014   Hyperlipidemia 05/15/2014   Glaucoma suspect 10/21/2011    PCP: Alvia Bring, DO  REFERRING PROVIDER: Jerri Kay HERO, MD  REFERRING DIAG: [phone] (ICD-10-CM) - Chronic left shoulder pain M75.42 (ICD-10-CM) - Impingement syndrome of left  shoulder  THERAPY DIAG:  Left shoulder pain, unspecified chronicity  Muscle weakness (generalized)  Localized edema  Rationale for Evaluation and Treatment: Rehabilitation  ONSET DATE: scope + DCE/SAD 01/04/24 L shoulder  SUBJECTIVE:                                                                                                                                                                                      SUBJECTIVE STATEMENT: 02/14/2024: states his shoulder is bothering from helping his grandson out this morning. Otherwise shoulder has been doing well, mild soreness after last session. Pain over the weekend which he attributes to moving chairs on Friday. States nerve glides last time pretty much resolved his numbness, hasn't recurred since. No other new updates   EVAL: Pt endorses chronic history with shoulder injuries, very active in sports, athletics, and [employer]. Worsening over past year prior to surgery. States since surgery pain has been steadily improving, is having difficulty w/ self care tasks, household tasks. Not performing heavier tasks around yard or home. Reports a bit of swelling in shoulder as expected, and bruising that is improving. States surgical follow up went well, reports being advised sling use PRN for comfort and no lifting 5# or greater. States yesterday he did remove one of his steri strips and it had some mild bleeding initially but no issues since. Denies constitutional symptoms (fevers, chills, SOB, etc). Has a bit of tingling in L lateral palm which he states is chronic, unchanged since surgery. Hand dominance: Right  PERTINENT HISTORY: DM, hx chest pain, cervical fusion, lumbar fusion (pt denies), HTN, hypothyroidism, LLE ulcer, R shoulder scope 2021, DM2, essential tremor, diabetic retinopathy Self reports aortic aneurysm, describes as stable and follows with cardiology  PAIN:  Are you having pain: 7/10 L shoulder  Per eval:  Location/description:  pinching in front of shoulder, posterior shoulder Best-worst over past week: 5-11.9/10  - aggravating factors: difficulty positioning while sitting, use of arm, picking up pots/pans, opening jar - Easing factors: sling use, icing, posiitoning    PRECAUTIONS: s/p shoulder scope, SAD, DCE (no formal restrictions on referral).   RED FLAGS: No fevers/chills, no SOB or other constitutional symptoms  WEIGHT BEARING RESTRICTIONS: No  FALLS:  Has patient fallen in last 6 months? 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  DIAGNOSTIC FINDINGS:  S/p L shoulder debridement, SAD, DCE 01/04/24  PATIENT SURVEYS:  QuickDASH: 70.45%   COGNITION: Overall cognitive status: Within functional limits for tasks assessed     SENSATION: Numbness L hand, chronic and stable, unchanging since surgery per pt report  EDEMA/INSPECTION:  Incisions well appearing without drainage or erythema, steri strips over posterior and lateral incisions, anterior does not have steri strip but no apparent opening of incision ; gross edema apparent about L GH joint WNL; mild bruising upper arm which pt states is improving since surgery  UPPER EXTREMITY ROM:  A/PROM Right eval Left eval Left 01/24/24 Left 01/31/24 L 02/07/24 L 02/09/24  Shoulder flexion A: 128 deg 91 deg passively A: 99 deg, mild pain on way down A: 120 deg  A: 125 deg AA: 134 deg  A: 135 deg *  Shoulder abduction        Shoulder internal rotation        Shoulder external rotation        Elbow flexion        Elbow  extension        Wrist flexion        Wrist extension         (Blank rows = not tested) (Key: WFL = within functional limits not formally assessed, * = concordant pain, s = stiffness/stretching sensation, NT = not tested)  Comments:    UPPER EXTREMITY MMT:  MMT Right eval Left eval 01/31/24 R / L  Shoulder flexion     Shoulder extension     Shoulder abduction     Shoulder extension     Shoulder internal rotation     Shoulder external rotation     Elbow flexion     Elbow extension     Grip strength 55# 65# 55# / 80#  (Blank rows = not tested)  (Key: WFL = within functional limits not formally assessed, * = concordant pain, s = stiffness/stretching sensation, NT = not tested)  Comments: MMT deferred given acuity of surgery   PALPATION:  TTP L UT, LS, rhomboid                                                                                                                              [facility] Adult PT Treatment:  DATE: 02/14/24 Therapeutic Exercise: Supine shoulder flexion AROM in comfortable ROM, x8 cues for form/pacing Supine shoulder flexion AAROM x8  Standing swiss ball flexion up wall 2x10 (most comfortable variation) cues for comfortable ROM and posture Green band scap retraction iso walkbacks 2x10  Modalities: Seated; L shoulder, vaso low compression 34 deg; pillow support  Self Care: Education on activity modification, monitoring symptoms, post op healing rehab protocol   [facility] Adult PT Treatment:                                                DATE: 02/09/24 Therapeutic Exercise: Swiss ball up wall 2x10 cues for comfortable ROM Green band iso scap retraction 2x10 BIL HEP update + education/discussion  Neuromuscular re-ed: Ulnar nerve glides (modified, supination/pronation only with elbow at side) x8 ; education on appropriate use PRN   Therapeutic Activity: Seated shoulder flexion AROM ~90 deg, 2x10; performed BIL for  reaching tolerance, cues for posture and comfortable ROM Seated shoulder scaption AROM ~90 deg 2x12 cues for posture  Modalities: Seated; L shoulder, vaso low compression 34 deg; pillow support    [facility] Adult PT Treatment:                                                DATE: 02/07/24 Therapeutic Exercise: Swiss ball up wall x8 cues for comfortable ROM RB scap retraction isos, walk back x10 cues for posture and shoulder positioning HEP discussion/education  Therapeutic Activity: Finger ladder x10 for reaching tolerance (up to 26) for reaching tolerance Seated shoulder flexion AROM <90 deg 3x8 cues for posture and pacing Seated scaption AROM <90 deg 2x8 cues for comfortable ROM and posture  Continuing to reinforce post op healing/protocol with activity modification      PATIENT EDUCATION: Education details: self care as above Person educated: Patient Education method: Programmer, multimedia, Facilities manager, Actor cues, Verbal cues Education comprehension: verbalized understanding, returned demonstration, verbal cues required, tactile cues required, and needs further education      HOME EXERCISE PROGRAM: Access Code: [email or web address] URL: [email or web address] Date: 02/09/2024 Prepared by: Alm Jenny  Exercises - Seated Shoulder Flexion Towel Slide at Table Top  - 2-3 x daily - 1 sets - 8-10 reps - Seated Gripping Towel  - 2-3 x daily - 1 sets - 8-10 reps - Seated Scapular Retraction  - 2-3 x daily - 1 sets - 8-10 reps - 5 sec hold - Seated Shoulder Scaption Slide at Table Top with Forearm in Neutral  - 2-3 x daily - 1 sets - 8-10 reps - Standing Shoulder Flexion to 90 Degrees with Dumbbells  - 2-3 x daily - 1 sets - 8-10 reps - Standing Shoulder Row Reactive Isometric  - 2-3 x daily - 1 sets - 10 reps  ASSESSMENT:  CLINICAL IMPRESSION:  02/14/2024: Pt arrives w/ increased pain (7/10) which he attributes to activities this morning and over weekend but overall states he  continues to do well. Given increased pain/fatigue on arrival, interventions modified to maximize mobility while mitigating symptoms. No increase in resting pain or adverse events although symptoms are more irritable today which limits progression of exercise. Self care education as above. Vaso at end of session given increased pain levels today. Recommend continuing along  current POC in order to address relevant deficits and improve functional tolerance. Pt departs today's session in no acute distress, all voiced questions/concerns addressed appropriately from PT perspective.      EVAL: Patient is a 73 y.o. gentleman who was seen today for physical therapy evaluation and treatment for L shoulder s/p debridement, SAD, DCE 01/04/24. No red flags or signs of post op complications today, did encourage monitoring of his incision as he states he removed anterior steri strip yesterday. Endorses difficulty with household tasks and limiting heavier tasks as expected post op. On exam he demonstrates limitations in passive mobility secondary to transient pain. Tolerates exam/HEP well overall without adverse event, HEP and education as above. Recommend trial of skilled PT to address aforementioned deficits with aim of improving functional tolerance and reducing pain with typical activities. Pt departs today's session in no acute distress, all voiced concerns/questions addressed appropriately from PT perspective.      OBJECTIVE IMPAIRMENTS: decreased activity tolerance, decreased endurance, decreased mobility, decreased ROM, decreased strength, impaired perceived functional ability, impaired UE functional use, improper body mechanics, postural dysfunction, and pain.   ACTIVITY LIMITATIONS: carrying, lifting, dressing, reach over head, and hygiene/grooming  PARTICIPATION LIMITATIONS: meal prep, cleaning, laundry, driving, shopping, and community activity  PERSONAL FACTORS: Age, Time since onset of  injury/illness/exacerbation, and 3+ comorbidities: hx chest pain, cervical fusion, lumbarfusion, HTN, hypothyroidism, LLE ulcer, R shoulder scope 2021, DM2, essential tremor, diabetic retinopathy are also affecting patient's functional outcome.   REHAB POTENTIAL: Good  CLINICAL DECISION MAKING: Stable/uncomplicated  EVALUATION COMPLEXITY: Low   GOALS:  SHORT TERM GOALS: Target date: 02/14/2024  Pt will demonstrate appropriate understanding and performance of initially prescribed HEP in order to facilitate improved independence with management of symptoms.  Baseline: HEP established  02/14/24: reports good HEP adherence  Goal status: MET  2. Pt will report at least 25% improvement in overall pain levels over past week in order to facilitate improved tolerance to typical daily activities.   Baseline: 5-11.910 Goal status: ONGOING   LONG TERM GOALS: Target date: 03/13/2024   Pt will score less than or equal to 50% on Quick DASH in order to indicate reduced levels of disability due to shoulder pain (MDC 16-20pts).  Baseline: 70.45%   Goal status: INITIAL  2.  Pt will demonstrate at least 130 degrees of active L shoulder elevation in order to demonstrate improved tolerance to functional movement patterns such as reaching overhead.  Baseline: see ROM chart above Goal status: INITIAL  3.  Pt will demonstrate at least 4+/5 shoulder flex/abd MMT for improved symmetry of UE strength and improved tolerance to functional movements.  Baseline: deferred on eval given acuity of surgery Goal status: INITIAL  4. Pt will report at least 50% decrease in overall pain levels in past week in order to facilitate improved tolerance to basic ADLs/mobility.   Baseline: 5-11.9/10  Goal status: INITIAL    5. Pt will endorse ability to mow lawn with less than 3 pt increase in pain in order to facilitate improved tolerance to usual tasks.  Baseline: unable to perform  Goal status: INITIAL  6. Pt will  endorse ability to perform usual self-care ADLs such as dressing/hygiene in order to facilitate improved return to PLOF.  Baseline: difficulty w/ upper body dressing, putting on deodorant  Goal status: INITIAL  PLAN:  PT FREQUENCY: 2x/week  PT DURATION: 8 weeks  PLANNED INTERVENTIONS: 97164- PT Re-evaluation, 97750- Physical Performance Testing, 97110-Therapeutic exercises, 97530- Therapeutic activity, [id number]- Neuromuscular re-education, 97535-  Self Care, 02859- Manual therapy, 97016- Vasopneumatic device, [phone] (1-2 muscles), 20561 (3+ muscles)- Dry Needling, Patient/Family education, Taping, Joint mobilization, Spinal mobilization, Scar mobilization, Cryotherapy, and Moist heat  PLAN FOR NEXT SESSION: Review/update HEP PRN. Work on  [facility] PROM/AAROM flexion/scaption within pt tolerance. Unresisted periscapular activation. No formal restrictions/protocol but would limit abduction and horizontal adduction first 6 weeks post op given DCE/SAD. Symptom modification strategies as indicated/appropriate.    Alm DELENA Jenny PT, DPT 02/14/2024 10:58 AM

## 2024-02-14 ENCOUNTER — Ambulatory Visit: Admitting: Physical Therapy

## 2024-02-14 ENCOUNTER — Encounter: Payer: Self-pay | Admitting: Physical Therapy

## 2024-02-14 DIAGNOSIS — M25512 Pain in left shoulder: Secondary | ICD-10-CM | POA: Diagnosis not present

## 2024-02-14 DIAGNOSIS — R600 Localized edema: Secondary | ICD-10-CM

## 2024-02-14 DIAGNOSIS — M6281 Muscle weakness (generalized): Secondary | ICD-10-CM

## 2024-02-16 ENCOUNTER — Ambulatory Visit: Admitting: Physical Therapy

## 2024-02-16 ENCOUNTER — Encounter: Payer: Self-pay | Admitting: Physical Therapy

## 2024-02-16 DIAGNOSIS — M25512 Pain in left shoulder: Secondary | ICD-10-CM | POA: Diagnosis not present

## 2024-02-16 DIAGNOSIS — M6281 Muscle weakness (generalized): Secondary | ICD-10-CM

## 2024-02-16 DIAGNOSIS — R600 Localized edema: Secondary | ICD-10-CM

## 2024-02-16 NOTE — Therapy (Signed)
 OUTPATIENT PHYSICAL THERAPY SHOULDER TREATMENT   Patient Name: Gregory FORMISANO [name] MRN: [id number] DOB:[birth year], 73 y.o., male Today's Date: 02/16/2024  END OF SESSION:  PT End of Session - 02/16/24 0959     Visit Number 9    Number of Visits 17    Date for Recertification  03/13/24    Authorization Type [facility]    Authorization - Visit Number 9    Authorization - Number of Visits 15    Progress Note Due on Visit 10    PT Start Time 1002    PT Stop Time 1045    PT Time Calculation (min) 43 min    Activity Tolerance Patient tolerated treatment well             Past Medical History:  Diagnosis Date   Allergy not sure   Arthritis    Chest pain    a. Normal cors 2001. b. Neg stress test 2012, 05/2014.   Diabetes mellitus without complication (HCC)    GERD (gastroesophageal reflux disease)    Hyperlipidemia    Hypertension    Hypertensive response to exercise    Hypothyroidism    Obesity    Obstructive sleep apnea    Currently untreated    PONV (postoperative nausea and vomiting)    Sleep apnea    +cpap   Ulcer of left lower extremity (HCC) 12/16/2022   Past Surgical History:  Procedure Laterality Date   CARDIAC CATHETERIZATION     CERVICAL FUSION     HERNIA REPAIR     KNEE ARTHROSCOPY  1984   both knee   [facility]   POSTERIOR LUMBAR FUSION 2 WITH HARDWARE REMOVAL Left 01/04/2024   Procedure: ARTHROSCOPY, SHOULDER WITH DEBRIDEMENT;  Surgeon: Jerri Kay HERO, MD;  Location: [facility];  Service: Orthopedics;  Laterality: Left;  LEFT SHOULDER ARTHROSCOPY, EXTENSIVE DEBRIDEMENT, DISTAL CLAVICLE EXCISION, SUBACROMIAL DECOMPRESSION   SHOULDER ARTHROSCOPY WITH SUBACROMIAL DECOMPRESSION Right 09/13/2019   Procedure: RIGHT SHOULDER ARTHROSCOPY WITH EXTENSIVE DEBRIDEMENT, SUBACROMIAL DECOMPRESSION,;  Surgeon: Vernetta Lonni GRADE, MD;  Location: [facility];  Service: Orthopedics;  Laterality: Right;   SPINE  SURGERY  1st Sept 1977   2nd Feb 2011   Patient Active Problem List   Diagnosis Date Noted   Arthritis of left acromioclavicular joint 01/04/2024   Diabetic retinopathy (HCC) 12/29/2023   On long term drug therapy 10/27/2023   Pure hypercholesterolemia 10/27/2023   Viral URI 06/28/2023   Splinter in skin 03/08/2023   COVID 02/02/2023   Bee sting reaction 01/11/2023   Ulcer of left lower extremity (HCC) 12/16/2022   Bilateral hand numbness 10/06/2022   Atypical pigmented lesion 09/29/2022   ED (erectile dysfunction) 09/29/2022   Fusion of spine of cervical region 09/14/2022   Bilateral third flexor stenosing tenosynovitis and first carpometacarpal osteoarthritis. 10/30/2021   Exercise counseling 07/29/2021   Other specified counseling 07/29/2021   Primary open-angle glaucoma, bilateral, mild stage 07/29/2021   Diabetic neuropathy (HCC) 07/31/2020   Abdominal aortic aneurysm (HCC) 07/31/2020   Insomnia 07/31/2020   GERD (gastroesophageal reflux disease) 01/09/2020   Impingement syndrome of left shoulder 08/02/2019   Interstitial pulmonary disease (HCC) 06/29/2018   Hypertension associated with diabetes (HCC) 07/13/2017   Benign essential tremor 07/13/2017   Kidney cyst, acquired 01/24/2017   Seborrheic keratoses 04/01/2016   Diverticulosis of colon without hemorrhage 02/26/2016   Anxiety state 10/02/2015   BPH (benign prostatic hyperplasia) 07/31/2015   Carpal tunnel syndrome, left  upper limb 06/04/2015   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   Vitamin D  deficiency 03/06/2015   History of pancreatitis 03/06/2015   Lumbar spondylosis 03/04/2015   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   Obstructive sleep apnea 05/15/2014   Hyperlipidemia 05/15/2014   Glaucoma suspect 10/21/2011    PCP: Alvia Bring, DO  REFERRING PROVIDER: Jerri Kay HERO, MD  REFERRING DIAG: [phone] (ICD-10-CM) - Chronic left shoulder pain M75.42  (ICD-10-CM) - Impingement syndrome of left shoulder  THERAPY DIAG:  Left shoulder pain, unspecified chronicity  Muscle weakness (generalized)  Localized edema  Rationale for Evaluation and Treatment: Rehabilitation  ONSET DATE: scope + DCE/SAD 01/04/24 L shoulder  SUBJECTIVE:                                                                                                                                                                                      SUBJECTIVE STATEMENT: 02/16/2024: 5/10 pain at present, not too bad. Felt good after last session. Did have a bit of numbness Tuesday evening but improved with rest. No other new updates.   EVAL: Pt endorses chronic history with shoulder injuries, very active in sports, athletics, and [employer]. Worsening over past year prior to surgery. States since surgery pain has been steadily improving, is having difficulty w/ self care tasks, household tasks. Not performing heavier tasks around yard or home. Reports a bit of swelling in shoulder as expected, and bruising that is improving. States surgical follow up went well, reports being advised sling use PRN for comfort and no lifting 5# or greater. States yesterday he did remove one of his steri strips and it had some mild bleeding initially but no issues since. Denies constitutional symptoms (fevers, chills, SOB, etc). Has a bit of tingling in L lateral palm which he states is chronic, unchanged since surgery. Hand dominance: Right  PERTINENT HISTORY: DM, hx chest pain, cervical fusion, lumbar fusion (pt denies), HTN, hypothyroidism, LLE ulcer, R shoulder scope 2021, DM2, essential tremor, diabetic retinopathy Self reports aortic aneurysm, describes as stable and follows with cardiology  PAIN:  Are you having pain: 5/10 at present, 7/10 at worst in past week (updated 02/16/24)  Per eval:  Location/description: pinching in front of shoulder, posterior shoulder Best-worst over past week: 5-11.9/10   - aggravating factors: difficulty positioning while sitting, use of arm, picking up pots/pans, opening jar - Easing factors: sling use, icing, posiitoning    PRECAUTIONS: s/p shoulder scope, SAD, DCE (no formal restrictions on referral).   RED FLAGS: No fevers/chills, no SOB or other constitutional symptoms  WEIGHT BEARING RESTRICTIONS: No  FALLS:  Has patient fallen in last 6 months? 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  DIAGNOSTIC FINDINGS:  S/p L shoulder debridement, SAD, DCE 01/04/24  PATIENT SURVEYS:  QuickDASH: 70.45%   COGNITION: Overall cognitive status: Within functional limits for tasks assessed     SENSATION: Numbness L hand, chronic and stable, unchanging since surgery per pt report  EDEMA/INSPECTION:  Incisions well appearing without drainage or erythema, steri strips over posterior and lateral incisions, anterior does not have steri strip but no apparent opening of incision ; gross edema apparent about L GH joint WNL; mild bruising upper arm which pt states is improving since surgery  UPPER EXTREMITY ROM:  A/PROM Right eval Left eval Left 01/24/24 Left 01/31/24 L 02/07/24 L 02/09/24 L 02/16/24  Shoulder flexion A: 128 deg 91 deg passively A: 99 deg, mild pain on way down A: 120 deg  A: 125 deg AA: 134 deg  A: 135 deg *   Shoulder abduction       A: 98 deg pain eccentrically  Shoulder internal rotation         Shoulder external rotation         Elbow flexion         Elbow extension         Wrist flexion         Wrist  extension          (Blank rows = not tested) (Key: WFL = within functional limits not formally assessed, * = concordant pain, s = stiffness/stretching sensation, NT = not tested)  Comments:    UPPER EXTREMITY MMT:  MMT Right eval Left eval 01/31/24 R / L  Shoulder flexion     Shoulder extension     Shoulder abduction     Shoulder extension     Shoulder internal rotation     Shoulder external rotation     Elbow flexion     Elbow extension     Grip strength 55# 65# 55# / 80#  (Blank rows = not tested)  (Key: WFL = within functional limits not formally assessed, * = concordant pain, s = stiffness/stretching sensation, NT = not tested)  Comments: MMT deferred given acuity of surgery   PALPATION:  TTP L UT, LS, rhomboid                                                                                                                              [facility] Adult PT Treatment:  DATE: 02/16/24 Therapeutic Exercise: Seated pulley flexion x46min AAROM Seated pulley scaption x80min AAROM Standing swiss ball abduction AAROM 2x8 LUE cues for comfortable ROM and positioning Standing swiss ball flexion AAROM 3x10 LUE  HEP update + education/handout   Neuromuscular re-ed: ER isometric 2x8 LUE cues for setup and comfortable force output IR isometric 2x8 LUE cues for appropriate force output Ulnar nerve glides LUE x10  Therapeutic Activity: Seated scaption AROM 2x10 within comfortable ROM cues for posture, for functional reaching tolerance Seated flexion AROM 2x8 within comfortable ROM  ROM measurements + education re: progression with functional use within surgical protocol    [facility] Adult PT Treatment:                                                DATE: 02/14/24 Therapeutic Exercise: Supine shoulder flexion AROM in comfortable ROM, x8 cues for form/pacing Supine shoulder flexion AAROM x8  Standing swiss ball flexion up wall 2x10 (most comfortable  variation) cues for comfortable ROM and posture Green band scap retraction iso walkbacks 2x10  Modalities: Seated; L shoulder, vaso low compression 34 deg; pillow support  Self Care: Education on activity modification, monitoring symptoms, post op healing rehab protocol   [facility] Adult PT Treatment:                                                DATE: 02/09/24 Therapeutic Exercise: Swiss ball up wall 2x10 cues for comfortable ROM Green band iso scap retraction 2x10 BIL HEP update + education/discussion  Neuromuscular re-ed: Ulnar nerve glides (modified, supination/pronation only with elbow at side) x8 ; education on appropriate use PRN   Therapeutic Activity: Seated shoulder flexion AROM ~90 deg, 2x10; performed BIL for reaching tolerance, cues for posture and comfortable ROM Seated shoulder scaption AROM ~90 deg 2x12 cues for posture  Modalities: Seated; L shoulder, vaso low compression 34 deg; pillow support    PATIENT EDUCATION: Education details: self care as above Person educated: Patient Education method: Programmer, multimedia, Facilities manager, Actor cues, Verbal cues Education comprehension: verbalized understanding, returned demonstration, verbal cues required, tactile cues required, and needs further education      HOME EXERCISE PROGRAM: Access Code: [email or web address] URL: [email or web address] Date: 02/16/2024 Prepared by: Alm Jenny  Exercises - Seated Shoulder Scaption Slide at Table Top with Forearm in Neutral  - 2-3 x daily - 1 sets - 8-10 reps - Standing Shoulder Flexion to 90 Degrees with Dumbbells  - 2-3 x daily - 1 sets - 8-10 reps - Standing Shoulder Row Reactive Isometric  - 2-3 x daily - 1 sets - 10 reps - Shoulder Flexion Wall Slide with Towel  - 2-3 x daily - 1 sets - 8 reps - Standing Isometric Shoulder Internal Rotation at Doorway  - 2-3 x daily - 1 sets - 6-8 reps - Standing Isometric Shoulder External Rotation with Doorway  - 2-3 x daily - 1 sets -  6-8 reps  ASSESSMENT:  CLINICAL IMPRESSION:  02/16/2024: Pt arrives w/ report of 5/10 pain, improved compared to last session. He is 6 weeks post op yesterday, so we begin to introduce shoulder abduction AAROM within tolerance. Also able to introduce isometrics with good tolerance. Cues as above. Reports improving symptoms as session goes  on, no adverse events. deferred vaso given improved symptoms today. Most difficulty with eccentric portion of most active/AA movements, transient numbness with active scaption that resolves with ulnar nerve glides. Recommend continuing along current POC in order to address relevant deficits and improve functional tolerance. Pt departs today's session in no acute distress, all voiced questions/concerns addressed appropriately from PT perspective.      EVAL: Patient is a 73 y.o. gentleman who was seen today for physical therapy evaluation and treatment for L shoulder s/p debridement, SAD, DCE 01/04/24. No red flags or signs of post op complications today, did encourage monitoring of his incision as he states he removed anterior steri strip yesterday. Endorses difficulty with household tasks and limiting heavier tasks as expected post op. On exam he demonstrates limitations in passive mobility secondary to transient pain. Tolerates exam/HEP well overall without adverse event, HEP and education as above. Recommend trial of skilled PT to address aforementioned deficits with aim of improving functional tolerance and reducing pain with typical activities. Pt departs today's session in no acute distress, all voiced concerns/questions addressed appropriately from PT perspective.      OBJECTIVE IMPAIRMENTS: decreased activity tolerance, decreased endurance, decreased mobility, decreased ROM, decreased strength, impaired perceived functional ability, impaired UE functional use, improper body mechanics, postural dysfunction, and pain.   ACTIVITY LIMITATIONS: carrying, lifting,  dressing, reach over head, and hygiene/grooming  PARTICIPATION LIMITATIONS: meal prep, cleaning, laundry, driving, shopping, and community activity  PERSONAL FACTORS: Age, Time since onset of injury/illness/exacerbation, and 3+ comorbidities: hx chest pain, cervical fusion, lumbarfusion, HTN, hypothyroidism, LLE ulcer, R shoulder scope 2021, DM2, essential tremor, diabetic retinopathy are also affecting patient's functional outcome.   REHAB POTENTIAL: Good  CLINICAL DECISION MAKING: Stable/uncomplicated  EVALUATION COMPLEXITY: Low   GOALS:  SHORT TERM GOALS: Target date: 02/14/2024  Pt will demonstrate appropriate understanding and performance of initially prescribed HEP in order to facilitate improved independence with management of symptoms.  Baseline: HEP established  02/14/24: reports good HEP adherence  Goal status: MET  2. Pt will report at least 25% improvement in overall pain levels over past week in order to facilitate improved tolerance to typical daily activities.   Baseline: 5-11.910 Goal status: ONGOING   LONG TERM GOALS: Target date: 03/13/2024   Pt will score less than or equal to 50% on Quick DASH in order to indicate reduced levels of disability due to shoulder pain (MDC 16-20pts).  Baseline: 70.45%   Goal status: INITIAL  2.  Pt will demonstrate at least 130 degrees of active L shoulder elevation in order to demonstrate improved tolerance to functional movement patterns such as reaching overhead.  Baseline: see ROM chart above Goal status: INITIAL  3.  Pt will demonstrate at least 4+/5 shoulder flex/abd MMT for improved symmetry of UE strength and improved tolerance to functional movements.  Baseline: deferred on eval given acuity of surgery Goal status: INITIAL  4. Pt will report at least 50% decrease in overall pain levels in past week in order to facilitate improved tolerance to basic ADLs/mobility.   Baseline: 5-11.9/10  Goal status: INITIAL    5. Pt  will endorse ability to mow lawn with less than 3 pt increase in pain in order to facilitate improved tolerance to usual tasks.  Baseline: unable to perform  Goal status: INITIAL  6. Pt will endorse ability to perform usual self-care ADLs such as dressing/hygiene in order to facilitate improved return to PLOF.  Baseline: difficulty w/ upper body dressing, putting on deodorant  Goal status: INITIAL  PLAN:  PT FREQUENCY: 2x/week  PT DURATION: 8 weeks  PLANNED INTERVENTIONS: 97164- PT Re-evaluation, 97750- Physical Performance Testing, 97110-Therapeutic exercises, 97530- Therapeutic activity, [id number]- Neuromuscular re-education, 97535- Self Care, [id number]- Manual therapy, 97016- Vasopneumatic device, 20560 (1-2 muscles), 20561 (3+ muscles)- Dry Needling, Patient/Family education, Taping, Joint mobilization, Spinal mobilization, Scar mobilization, Cryotherapy, and Moist heat  PLAN FOR NEXT SESSION: Review/update HEP PRN. Work on  [facility] PROM/AAROM flexion/scaption within pt tolerance. Unresisted periscapular activation. No formal restrictions/protocol but would limit abduction and horizontal adduction first 6 weeks post op given DCE/SAD. Symptom modification strategies as indicated/appropriate. Progress note next visit (10)   Alm DELENA Jenny PT, DPT 02/16/2024 10:50 AM

## 2024-02-20 NOTE — Therapy (Signed)
 OUTPATIENT PHYSICAL THERAPY SHOULDER TREATMENT   Patient Name: Gregory MAGOS [name] MRN: [id number] DOB:Oct 01, 1950, 73 y.o., male Today's Date: 02/20/2024  END OF SESSION:       Past Medical History:  Diagnosis Date   Allergy not sure   Arthritis    Chest pain    a. Normal cors 2001. b. Neg stress test 2012, 05/2014.   Diabetes mellitus without complication (HCC)    GERD (gastroesophageal reflux disease)    Hyperlipidemia    Hypertension    Hypertensive response to exercise    Hypothyroidism    Obesity    Obstructive sleep apnea    Currently untreated    PONV (postoperative nausea and vomiting)    Sleep apnea    +cpap   Ulcer of left lower extremity (HCC) 12/16/2022   Past Surgical History:  Procedure Laterality Date   CARDIAC CATHETERIZATION     CERVICAL FUSION     HERNIA REPAIR     KNEE ARTHROSCOPY  1984   both knee   [facility]   POSTERIOR LUMBAR FUSION 2 WITH HARDWARE REMOVAL Left 01/04/2024   Procedure: ARTHROSCOPY, SHOULDER WITH DEBRIDEMENT;  Surgeon: Jerri Kay HERO, MD;  Location: [facility];  Service: Orthopedics;  Laterality: Left;  LEFT SHOULDER ARTHROSCOPY, EXTENSIVE DEBRIDEMENT, DISTAL CLAVICLE EXCISION, SUBACROMIAL DECOMPRESSION   SHOULDER ARTHROSCOPY WITH SUBACROMIAL DECOMPRESSION Right 09/13/2019   Procedure: RIGHT SHOULDER ARTHROSCOPY WITH EXTENSIVE DEBRIDEMENT, SUBACROMIAL DECOMPRESSION,;  Surgeon: Vernetta Lonni GRADE, MD;  Location: [facility];  Service: Orthopedics;  Laterality: Right;   SPINE SURGERY  1st Sept 1977   2nd Feb 2011   Patient Active Problem List   Diagnosis Date Noted   Arthritis of left acromioclavicular joint 01/04/2024   Diabetic retinopathy (HCC) 12/29/2023   On long term drug therapy 10/27/2023   Pure hypercholesterolemia 10/27/2023   Viral URI 06/28/2023   Splinter in skin 03/08/2023   COVID 02/02/2023   Bee sting reaction 01/11/2023   Ulcer of left lower  extremity (HCC) 12/16/2022   Bilateral hand numbness 10/06/2022   Atypical pigmented lesion 09/29/2022   ED (erectile dysfunction) 09/29/2022   Fusion of spine of cervical region 09/14/2022   Bilateral third flexor stenosing tenosynovitis and first carpometacarpal osteoarthritis. 10/30/2021   Exercise counseling 07/29/2021   Other specified counseling 07/29/2021   Primary open-angle glaucoma, bilateral, mild stage 07/29/2021   Diabetic neuropathy (HCC) 07/31/2020   Abdominal aortic aneurysm (HCC) 07/31/2020   Insomnia 07/31/2020   GERD (gastroesophageal reflux disease) 01/09/2020   Impingement syndrome of left shoulder 08/02/2019   Interstitial pulmonary disease (HCC) 06/29/2018   Hypertension associated with diabetes (HCC) 07/13/2017   Benign essential tremor 07/13/2017   Kidney cyst, acquired 01/24/2017   Seborrheic keratoses 04/01/2016   Diverticulosis of colon without hemorrhage 02/26/2016   Anxiety state 10/02/2015   BPH (benign prostatic hyperplasia) 07/31/2015   Carpal tunnel syndrome, left upper limb 06/04/2015   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   Vitamin D  deficiency 03/06/2015   History of pancreatitis 03/06/2015   Lumbar spondylosis 03/04/2015   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   Obstructive sleep apnea 05/15/2014   Hyperlipidemia 05/15/2014   Glaucoma suspect 10/21/2011    PCP: Alvia Bring, DO  REFERRING PROVIDER: Jerri Kay HERO, MD  REFERRING DIAG: [phone] (ICD-10-CM) - Chronic left shoulder pain M75.42 (ICD-10-CM) - Impingement syndrome of left shoulder  THERAPY DIAG:  No diagnosis found.  Rationale for Evaluation and Treatment: Rehabilitation  ONSET  DATE: scope + DCE/SAD 01/04/24 L shoulder  SUBJECTIVE:                                                                                                                                                                                      SUBJECTIVE  STATEMENT: 02/20/2024: ***  *** 5/10 pain at present, not too bad. Felt good after last session. Did have a bit of numbness Tuesday evening but improved with rest. No other new updates.   EVAL: Pt endorses chronic history with shoulder injuries, very active in sports, athletics, and [employer]. Worsening over past year prior to surgery. States since surgery pain has been steadily improving, is having difficulty w/ self care tasks, household tasks. Not performing heavier tasks around yard or home. Reports a bit of swelling in shoulder as expected, and bruising that is improving. States surgical follow up went well, reports being advised sling use PRN for comfort and no lifting 5# or greater. States yesterday he did remove one of his steri strips and it had some mild bleeding initially but no issues since. Denies constitutional symptoms (fevers, chills, SOB, etc). Has a bit of tingling in L lateral palm which he states is chronic, unchanged since surgery. Hand dominance: Right  PERTINENT HISTORY: DM, hx chest pain, cervical fusion, lumbar fusion (pt denies), HTN, hypothyroidism, LLE ulcer, R shoulder scope 2021, DM2, essential tremor, diabetic retinopathy Self reports aortic aneurysm, describes as stable and follows with cardiology  PAIN:  Are you having pain: 5/10 at present, 7/10 at worst in past week (updated 02/16/24)  Per eval:  Location/description: pinching in front of shoulder, posterior shoulder Best-worst over past week: 5-11.9/10  - aggravating factors: difficulty positioning while sitting, use of arm, picking up pots/pans, opening jar - Easing factors: sling use, icing, posiitoning    PRECAUTIONS: s/p shoulder scope, SAD, DCE (no formal restrictions on referral).   RED FLAGS: No fevers/chills, no SOB or other constitutional symptoms  WEIGHT BEARING RESTRICTIONS: No  FALLS:  Has patient fallen in last 6 months? 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  DIAGNOSTIC FINDINGS:  S/p L shoulder debridement, SAD, DCE 01/04/24  PATIENT SURVEYS:  QuickDASH: 70.45%   COGNITION: Overall cognitive status:  Within functional limits for tasks assessed     SENSATION: Numbness L hand, chronic and stable, unchanging since surgery per pt report  EDEMA/INSPECTION:  Incisions well appearing without drainage or erythema, steri strips over posterior and lateral incisions, anterior does not have steri strip but no apparent opening of incision ; gross edema apparent about L GH joint WNL; mild bruising upper arm which pt states is improving since surgery  UPPER EXTREMITY ROM:  A/PROM Right eval Left eval Left 01/24/24 Left 01/31/24 L 02/07/24 L 02/09/24 L 02/16/24  Shoulder flexion A: 128 deg 91 deg passively A: 99 deg, mild pain on way down A: 120 deg  A: 125 deg AA: 134 deg  A: 135 deg *   Shoulder abduction       A: 98 deg pain eccentrically  Shoulder internal rotation         Shoulder external rotation         Elbow flexion         Elbow extension         Wrist flexion         Wrist extension          (Blank rows = not tested) (Key: WFL = within functional limits not formally assessed, * = concordant pain, s = stiffness/stretching sensation, NT = not tested)  Comments:    UPPER EXTREMITY MMT:  MMT Right eval Left eval 01/31/24 R / L  Shoulder flexion     Shoulder extension     Shoulder abduction     Shoulder extension     Shoulder internal rotation     Shoulder external rotation     Elbow flexion     Elbow extension      Grip strength 55# 65# 55# / 80#  (Blank rows = not tested)  (Key: WFL = within functional limits not formally assessed, * = concordant pain, s = stiffness/stretching sensation, NT = not tested)  Comments: MMT deferred given acuity of surgery   PALPATION:  TTP L UT, LS, rhomboid                                                                                                                              [facility] Adult PT Treatment:                                                DATE: 02/21/24 Therapeutic Exercise: *** Manual Therapy: *** Neuromuscular re-ed: *** Therapeutic Activity: *** Modalities: *** Self Care: [facility] Adult PT Treatment:  DATE: 02/16/24 Therapeutic Exercise: Seated pulley flexion x53min AAROM Seated pulley scaption x25min AAROM Standing swiss ball abduction AAROM 2x8 LUE cues for comfortable ROM and positioning Standing swiss ball flexion AAROM 3x10 LUE  HEP update + education/handout   Neuromuscular re-ed: ER isometric 2x8 LUE cues for setup and comfortable force output IR isometric 2x8 LUE cues for appropriate force output Ulnar nerve glides LUE x10  Therapeutic Activity: Seated scaption AROM 2x10 within comfortable ROM cues for posture, for functional reaching tolerance Seated flexion AROM 2x8 within comfortable ROM  ROM measurements + education re: progression with functional use within surgical protocol    [facility] Adult PT Treatment:                                                DATE: 02/14/24 Therapeutic Exercise: Supine shoulder flexion AROM in comfortable ROM, x8 cues for form/pacing Supine shoulder flexion AAROM x8  Standing swiss ball flexion up wall 2x10 (most comfortable variation) cues for comfortable ROM and posture Green band scap retraction iso walkbacks 2x10  Modalities: Seated; L shoulder, vaso low compression 34 deg; pillow support  Self Care: Education on activity modification,  monitoring symptoms, post op healing rehab protocol   [facility] Adult PT Treatment:                                                DATE: 02/09/24 Therapeutic Exercise: Swiss ball up wall 2x10 cues for comfortable ROM Green band iso scap retraction 2x10 BIL HEP update + education/discussion  Neuromuscular re-ed: Ulnar nerve glides (modified, supination/pronation only with elbow at side) x8 ; education on appropriate use PRN   Therapeutic Activity: Seated shoulder flexion AROM ~90 deg, 2x10; performed BIL for reaching tolerance, cues for posture and comfortable ROM Seated shoulder scaption AROM ~90 deg 2x12 cues for posture  Modalities: Seated; L shoulder, vaso low compression 34 deg; pillow support    PATIENT EDUCATION: Education details: self care as above Person educated: Patient Education method: Programmer, multimedia, Facilities manager, Actor cues, Verbal cues Education comprehension: verbalized understanding, returned demonstration, verbal cues required, tactile cues required, and needs further education      HOME EXERCISE PROGRAM: Access Code: [email or web address] URL: [email or web address] Date: 02/16/2024 Prepared by: Alm Jenny  Exercises - Seated Shoulder Scaption Slide at Table Top with Forearm in Neutral  - 2-3 x daily - 1 sets - 8-10 reps - Standing Shoulder Flexion to 90 Degrees with Dumbbells  - 2-3 x daily - 1 sets - 8-10 reps - Standing Shoulder Row Reactive Isometric  - 2-3 x daily - 1 sets - 10 reps - Shoulder Flexion Wall Slide with Towel  - 2-3 x daily - 1 sets - 8 reps - Standing Isometric Shoulder Internal Rotation at Doorway  - 2-3 x daily - 1 sets - 6-8 reps - Standing Isometric Shoulder External Rotation with Doorway  - 2-3 x daily - 1 sets - 6-8 reps  ASSESSMENT:  CLINICAL IMPRESSION:  02/20/2024: ***  *** Pt arrives w/ report of 5/10 pain, improved compared to last session. He is 6 weeks post op yesterday, so we begin to introduce shoulder abduction AAROM  within tolerance. Also able to introduce isometrics with good tolerance. Cues as above. Reports improving symptoms  as session goes on, no adverse events. deferred vaso given improved symptoms today. Most difficulty with eccentric portion of most active/AA movements, transient numbness with active scaption that resolves with ulnar nerve glides. Recommend continuing along current POC in order to address relevant deficits and improve functional tolerance. Pt departs today's session in no acute distress, all voiced questions/concerns addressed appropriately from PT perspective.      EVAL: Patient is a 73 y.o. gentleman who was seen today for physical therapy evaluation and treatment for L shoulder s/p debridement, SAD, DCE 01/04/24. No red flags or signs of post op complications today, did encourage monitoring of his incision as he states he removed anterior steri strip yesterday. Endorses difficulty with household tasks and limiting heavier tasks as expected post op. On exam he demonstrates limitations in passive mobility secondary to transient pain. Tolerates exam/HEP well overall without adverse event, HEP and education as above. Recommend trial of skilled PT to address aforementioned deficits with aim of improving functional tolerance and reducing pain with typical activities. Pt departs today's session in no acute distress, all voiced concerns/questions addressed appropriately from PT perspective.      OBJECTIVE IMPAIRMENTS: decreased activity tolerance, decreased endurance, decreased mobility, decreased ROM, decreased strength, impaired perceived functional ability, impaired UE functional use, improper body mechanics, postural dysfunction, and pain.   ACTIVITY LIMITATIONS: carrying, lifting, dressing, reach over head, and hygiene/grooming  PARTICIPATION LIMITATIONS: meal prep, cleaning, laundry, driving, shopping, and community activity  PERSONAL FACTORS: Age, Time since onset of  injury/illness/exacerbation, and 3+ comorbidities: hx chest pain, cervical fusion, lumbarfusion, HTN, hypothyroidism, LLE ulcer, R shoulder scope 2021, DM2, essential tremor, diabetic retinopathy are also affecting patient's functional outcome.   REHAB POTENTIAL: Good  CLINICAL DECISION MAKING: Stable/uncomplicated  EVALUATION COMPLEXITY: Low   GOALS:  SHORT TERM GOALS: Target date: 02/14/2024  Pt will demonstrate appropriate understanding and performance of initially prescribed HEP in order to facilitate improved independence with management of symptoms.  Baseline: HEP established  02/14/24: reports good HEP adherence  Goal status: MET  2. Pt will report at least 25% improvement in overall pain levels over past week in order to facilitate improved tolerance to typical daily activities.   Baseline: 5-11.910 Goal status: ONGOING   LONG TERM GOALS: Target date: 03/13/2024   Pt will score less than or equal to 50% on Quick DASH in order to indicate reduced levels of disability due to shoulder pain (MDC 16-20pts).  Baseline: 70.45%   Goal status: INITIAL  2.  Pt will demonstrate at least 130 degrees of active L shoulder elevation in order to demonstrate improved tolerance to functional movement patterns such as reaching overhead.  Baseline: see ROM chart above Goal status: INITIAL  3.  Pt will demonstrate at least 4+/5 shoulder flex/abd MMT for improved symmetry of UE strength and improved tolerance to functional movements.  Baseline: deferred on eval given acuity of surgery Goal status: INITIAL  4. Pt will report at least 50% decrease in overall pain levels in past week in order to facilitate improved tolerance to basic ADLs/mobility.   Baseline: 5-11.9/10  Goal status: INITIAL    5. Pt will endorse ability to mow lawn with less than 3 pt increase in pain in order to facilitate improved tolerance to usual tasks.  Baseline: unable to perform  Goal status: INITIAL  6. Pt will  endorse ability to perform usual self-care ADLs such as dressing/hygiene in order to facilitate improved return to PLOF.  Baseline: difficulty w/ upper body dressing, putting  on deodorant  Goal status: INITIAL  PLAN:  PT FREQUENCY: 2x/week  PT DURATION: 8 weeks  PLANNED INTERVENTIONS: 97164- PT Re-evaluation, 97750- Physical Performance Testing, 97110-Therapeutic exercises, 97530- Therapeutic activity, [id number]- Neuromuscular re-education, 97535- Self Care, [id number]- Manual therapy, 97016- Vasopneumatic device, 20560 (1-2 muscles), 20561 (3+ muscles)- Dry Needling, Patient/Family education, Taping, Joint mobilization, Spinal mobilization, Scar mobilization, Cryotherapy, and Moist heat  PLAN FOR NEXT SESSION: Review/update HEP PRN. Work on  [facility] PROM/AAROM flexion/scaption within pt tolerance. Unresisted periscapular activation. No formal restrictions/protocol but would limit abduction and horizontal adduction first 6 weeks post op given DCE/SAD. Symptom modification strategies as indicated/appropriate. Progress note next visit (10)   Alm DELENA Jenny PT, DPT 02/20/2024 9:42 AM

## 2024-02-21 ENCOUNTER — Ambulatory Visit: Admitting: Physical Therapy

## 2024-02-21 ENCOUNTER — Encounter: Payer: Self-pay | Admitting: Physical Therapy

## 2024-02-21 DIAGNOSIS — M25512 Pain in left shoulder: Secondary | ICD-10-CM

## 2024-02-21 DIAGNOSIS — R600 Localized edema: Secondary | ICD-10-CM

## 2024-02-21 DIAGNOSIS — M6281 Muscle weakness (generalized): Secondary | ICD-10-CM

## 2024-02-23 ENCOUNTER — Ambulatory Visit: Admitting: Physical Therapy

## 2024-02-27 NOTE — Therapy (Signed)
 OUTPATIENT PHYSICAL THERAPY SHOULDER TREATMENT    Patient Name: Gregory BLAZEJEWSKI [name] MRN: [id number] DOB:1950/10/06, 73 y.o., male Today's Date: 02/28/2024      END OF SESSION:  PT End of Session - 02/28/24 1014     Visit Number 11    Number of Visits 17    Date for Recertification  03/13/24    Authorization Type [facility]    Authorization - Visit Number 11    Authorization - Number of Visits 15    Progress Note Due on Visit 20    PT Start Time 1014    PT Stop Time 1055    PT Time Calculation (min) 41 min               Past Medical History:  Diagnosis Date   Allergy not sure   Arthritis    Chest pain    a. Normal cors 2001. b. Neg stress test 2012, 05/2014.   Diabetes mellitus without complication (HCC)    GERD (gastroesophageal reflux disease)    Hyperlipidemia    Hypertension    Hypertensive response to exercise    Hypothyroidism    Obesity    Obstructive sleep apnea    Currently untreated    PONV (postoperative nausea and vomiting)    Sleep apnea    +cpap   Ulcer of left lower extremity (HCC) 12/16/2022   Past Surgical History:  Procedure Laterality Date   CARDIAC CATHETERIZATION     CERVICAL FUSION     HERNIA REPAIR     KNEE ARTHROSCOPY  1984   both knee   [facility]   POSTERIOR LUMBAR FUSION 2 WITH HARDWARE REMOVAL Left 01/04/2024   Procedure: ARTHROSCOPY, SHOULDER WITH DEBRIDEMENT;  Surgeon: Jerri Kay HERO, MD;  Location: [facility];  Service: Orthopedics;  Laterality: Left;  LEFT SHOULDER ARTHROSCOPY, EXTENSIVE DEBRIDEMENT, DISTAL CLAVICLE EXCISION, SUBACROMIAL DECOMPRESSION   SHOULDER ARTHROSCOPY WITH SUBACROMIAL DECOMPRESSION Right 09/13/2019   Procedure: RIGHT SHOULDER ARTHROSCOPY WITH EXTENSIVE DEBRIDEMENT, SUBACROMIAL DECOMPRESSION,;  Surgeon: Vernetta Lonni GRADE, MD;  Location: [facility];  Service: Orthopedics;  Laterality: Right;   SPINE SURGERY  1st Sept 1977   2nd Feb 2011    Patient Active Problem List   Diagnosis Date Noted   Arthritis of left acromioclavicular joint 01/04/2024   Diabetic retinopathy (HCC) 12/29/2023   On long term drug therapy 10/27/2023   Pure hypercholesterolemia 10/27/2023   Viral URI 06/28/2023   Splinter in skin 03/08/2023   COVID 02/02/2023   Bee sting reaction 01/11/2023   Ulcer of left lower extremity (HCC) 12/16/2022   Bilateral hand numbness 10/06/2022   Atypical pigmented lesion 09/29/2022   ED (erectile dysfunction) 09/29/2022   Fusion of spine of cervical region 09/14/2022   Bilateral third flexor stenosing tenosynovitis and first carpometacarpal osteoarthritis. 10/30/2021   Exercise counseling 07/29/2021   Other specified counseling 07/29/2021   Primary open-angle glaucoma, bilateral, mild stage 07/29/2021   Diabetic neuropathy (HCC) 07/31/2020   Abdominal aortic aneurysm 07/31/2020   Insomnia 07/31/2020   GERD (gastroesophageal reflux disease) 01/09/2020   Impingement syndrome of left shoulder 08/02/2019   Interstitial pulmonary disease (HCC) 06/29/2018   Hypertension associated with diabetes (HCC) 07/13/2017   Benign essential tremor 07/13/2017   Kidney cyst, acquired 01/24/2017   Seborrheic keratoses 04/01/2016   Diverticulosis of colon without hemorrhage 02/26/2016   Anxiety state 10/02/2015   BPH (benign prostatic hyperplasia) 07/31/2015   Carpal tunnel syndrome, left upper limb 06/04/2015  Hypogonadotropic hypogonadism 03/06/2015   Vitamin D  deficiency 03/06/2015   History of pancreatitis 03/06/2015   Lumbar spondylosis 03/04/2015   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   Obstructive sleep apnea 05/15/2014   Hyperlipidemia 05/15/2014   Glaucoma suspect 10/21/2011    PCP: Alvia Bring, DO  REFERRING PROVIDER: Jerri Kay HERO, MD  REFERRING DIAG: [phone] (ICD-10-CM) - Chronic left shoulder pain M75.42 (ICD-10-CM) - Impingement syndrome of left  shoulder  THERAPY DIAG:  Left shoulder pain, unspecified chronicity  Muscle weakness (generalized)  Localized edema  Rationale for Evaluation and Treatment: Rehabilitation  ONSET DATE: scope + DCE/SAD 01/04/24 L shoulder  SUBJECTIVE:                                                                                                                                                                                      SUBJECTIVE STATEMENT: 02/28/2024: had a very busy weekend, did lots of cooking, moving tables/chairs at church. Between that and weather, reports increased soreness today. Having some improvements in reaching.    EVAL: Pt endorses chronic history with shoulder injuries, very active in sports, athletics, and [employer]. Worsening over past year prior to surgery. States since surgery pain has been steadily improving, is having difficulty w/ self care tasks, household tasks. Not performing heavier tasks around yard or home. Reports a bit of swelling in shoulder as expected, and bruising that is improving. States surgical follow up went well, reports being advised sling use PRN for comfort and no lifting 5# or greater. States yesterday he did remove one of his steri strips and it had some mild bleeding initially but no issues since. Denies constitutional symptoms (fevers, chills, SOB, etc). Has a bit of tingling in L lateral palm which he states is chronic, unchanged since surgery. Hand dominance: Right  PERTINENT HISTORY: DM, hx chest pain, cervical fusion, lumbar fusion (pt denies), HTN, hypothyroidism, LLE ulcer, R shoulder scope 2021, DM2, essential tremor, diabetic retinopathy Self reports aortic aneurysm, describes as stable and follows with cardiology  PAIN:  Are you having pain: 6/10 at present, 0-9/10 in past week (updated 02/28/24)    Per eval:  Location/description: pinching in front of shoulder, posterior shoulder Best-worst over past week: 5-11.9/10  - aggravating factors:  difficulty positioning while sitting, use of arm, picking up pots/pans, opening jar - Easing factors: sling use, icing, posiitoning    PRECAUTIONS: s/p shoulder scope, SAD, DCE (no formal restrictions on referral).   RED FLAGS: No fevers/chills, no SOB or other constitutional symptoms  WEIGHT BEARING RESTRICTIONS: No  FALLS:  Has patient fallen in last 6 months? 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  DIAGNOSTIC FINDINGS:  S/p L shoulder debridement, SAD, DCE 01/04/24  PATIENT SURVEYS:  QuickDASH: 70.45%   02/21/24 quickDASH: 47.7%   COGNITION: Overall cognitive status: Within functional limits for tasks assessed     SENSATION: Numbness L hand, chronic and stable, unchanging since surgery per pt report  EDEMA/INSPECTION:  Incisions well appearing without drainage or erythema, steri strips over posterior and lateral incisions, anterior does not have steri strip but no apparent opening of incision ; gross edema apparent about L GH joint WNL; mild bruising upper arm which pt states is improving since surgery  UPPER EXTREMITY ROM:  A/PROM Right eval Left eval Left 01/24/24 Left 01/31/24 L 02/07/24 L 02/09/24 L 02/16/24 L 02/21/24 L 02/28/24  Shoulder flexion A: 128 deg 91 deg passively A: 99 deg, mild pain on way down A: 120 deg  A: 125 deg AA: 134 deg  A: 135 deg *  A: 138 deg *   Shoulder abduction       A: 98 deg pain eccentrically A: 99 deg *    Shoulder internal rotation         Functional combo; upper glute  Shoulder external rotation          Functional combo; occiput  Elbow flexion           Elbow extension           Wrist flexion           Wrist extension            (Blank rows = not tested) (Key: WFL = within functional limits not formally assessed, * = concordant pain, s = stiffness/stretching sensation, NT = not tested)  Comments:    UPPER EXTREMITY MMT:  MMT Right eval Left eval 01/31/24 R / L  Shoulder flexion     Shoulder extension     Shoulder abduction     Shoulder extension     Shoulder internal rotation     Shoulder external rotation     Elbow flexion     Elbow extension     Grip strength 55# 65# 55# / 80#  (Blank rows = not tested)  (Key: WFL = within functional limits not formally assessed, * = concordant pain, s = stiffness/stretching sensation, NT = not tested)  Comments: MMT deferred given acuity of surgery   PALPATION:  TTP L UT, LS, rhomboid  [facility] Adult PT Treatment:                                                DATE: 02/28/24 Therapeutic Exercise: Shoulder flexion walkbacks x12  Shoulder abduction walkback x12  Standing swiss ball horiz abd/add x12  Sidelying L GH ER x12 (3 sec iso at top) cues for positioning  Sidelying shoulder scaption LUE, x8 cues to mitigate excessive anterior compression Sidelying horizontal abd LUE x8  HEP education/discussion  Neuromuscular re-ed: Green band row 2x8 cues for posture RB ER iso walkout x12  RB IR iso walkout x12     [facility] Adult PT Treatment:                                                DATE: 02/21/24 Therapeutic Exercise: Standing swiss ball abduction AAROM 2x12  Standing swiss ball horizontal add/abd AAROM 2x8 cues for comfortable ROM HEP update + education/handout   Neuromuscular re-ed: ER iso walkout, RB x10 LUE IR iso walkout RB x10  Green band row x10 cues for appropriate periscap activation  Therapeutic  Activity: MSK assessment + education Education/discussion re: progress with PT, symptom behavior as it affects activity tolerance, PT goals/POC     [facility] Adult PT Treatment:                                                DATE: 02/16/24 Therapeutic Exercise: Seated pulley flexion x76min AAROM Seated pulley scaption x62min AAROM Standing swiss ball abduction AAROM 2x8 LUE cues for comfortable ROM and positioning Standing swiss ball flexion AAROM 3x10 LUE  HEP update + education/handout   Neuromuscular re-ed: ER isometric 2x8 LUE cues for setup and comfortable force output IR isometric 2x8 LUE cues for appropriate force output Ulnar nerve glides LUE x10  Therapeutic Activity: Seated scaption AROM 2x10 within comfortable ROM cues for posture, for functional reaching tolerance Seated flexion AROM 2x8 within comfortable ROM  ROM measurements + education re: progression with functional use within surgical protocol   PATIENT EDUCATION: Education details: self care as above Person educated: Patient Education method: Programmer, multimedia, Demonstration, Actor cues, Verbal cues Education comprehension: verbalized understanding, returned demonstration, verbal cues required, tactile cues required, and needs further education      HOME EXERCISE PROGRAM: Access Code: [email or web address] URL: [email or web address] Date: 02/21/2024 Prepared by: Alm Jenny  Exercises - Seated Shoulder Abduction Towel Slide at Table Top  - 2-3 x daily - 1 sets - 10 reps - Standing Shoulder Flexion to 90 Degrees with Dumbbells  - 2-3 x daily - 1 sets - 8-10 reps - Standing Shoulder Row Reactive Isometric  - 2-3 x daily - 1 sets - 10 reps - Shoulder Flexion Wall Slide with Towel  - 2-3 x daily - 1 sets - 8 reps - Standing Isometric Shoulder Internal Rotation at Doorway  - 3-4 x weekly - 2-3 sets - 6-8 reps - Standing Isometric Shoulder External Rotation with Doorway  - 3-4 x weekly - 2-3 sets - 6-8  reps  ASSESSMENT:  CLINICAL IMPRESSION:  02/28/2024: pt arrives w/ 6/10 pain after increased  activities this weekend, tomorrow 8 weeks post op. Today we focus on end range mobility and active endurance throughout tolerated ROM. Noted improvement in tolerance to abd and horizontal abd/add compared to past sessions, continues to require cues throughout for scapular mechanics and GH positioning to mitigate anterior/superior shoulder discomfort. Primarily limited by fatigue/stiffness today, no adverse events, reports 5-6/10 pain on departure. Recommend continuing along current POC in order to address relevant deficits and improve functional tolerance. Pt departs today's session in no acute distress, all voiced questions/concerns addressed appropriately from PT perspective.      EVAL: Patient is a 73 y.o. gentleman who was seen today for physical therapy evaluation and treatment for L shoulder s/p debridement, SAD, DCE 01/04/24. No red flags or signs of post op complications today, did encourage monitoring of his incision as he states he removed anterior steri strip yesterday. Endorses difficulty with household tasks and limiting heavier tasks as expected post op. On exam he demonstrates limitations in passive mobility secondary to transient pain. Tolerates exam/HEP well overall without adverse event, HEP and education as above. Recommend trial of skilled PT to address aforementioned deficits with aim of improving functional tolerance and reducing pain with typical activities. Pt departs today's session in no acute distress, all voiced concerns/questions addressed appropriately from PT perspective.      OBJECTIVE IMPAIRMENTS: decreased activity tolerance, decreased endurance, decreased mobility, decreased ROM, decreased strength, impaired perceived functional ability, impaired UE functional use, improper body mechanics, postural dysfunction, and pain.   ACTIVITY LIMITATIONS: carrying, lifting, dressing, reach  over head, and hygiene/grooming  PARTICIPATION LIMITATIONS: meal prep, cleaning, laundry, driving, shopping, and community activity  PERSONAL FACTORS: Age, Time since onset of injury/illness/exacerbation, and 3+ comorbidities: hx chest pain, cervical fusion, lumbarfusion, HTN, hypothyroidism, LLE ulcer, R shoulder scope 2021, DM2, essential tremor, diabetic retinopathy are also affecting patient's functional outcome.   REHAB POTENTIAL: Good  CLINICAL DECISION MAKING: Stable/uncomplicated  EVALUATION COMPLEXITY: Low   GOALS:  SHORT TERM GOALS: Target date: 02/14/2024  Pt will demonstrate appropriate understanding and performance of initially prescribed HEP in order to facilitate improved independence with management of symptoms.  Baseline: HEP established  02/14/24: reports good HEP adherence  Goal status: MET  2. Pt will report at least 25% improvement in overall pain levels over past week in order to facilitate improved tolerance to typical daily activities.   Baseline: 5-11.910 02/21/24: 0-8/10 Goal status: ONGOING   LONG TERM GOALS: Target date: 03/13/2024   Pt will score less than or equal to 50% on Quick DASH in order to indicate reduced levels of disability due to shoulder pain (MDC 16-20pts).  Baseline: 70.45%   02/21/24: 47.7%  Goal status: MET  2.  Pt will demonstrate at least 130 degrees of active L shoulder elevation in order to demonstrate improved tolerance to functional movement patterns such as reaching overhead.  Baseline: see ROM chart above 02/21/24: see ROM chart above (flexion >130 deg, abduction <100) Goal status: PARTIALLY MET   3.  Pt will demonstrate at least 4+/5 shoulder flex/abd MMT for improved symmetry of UE strength and improved tolerance to functional movements.  Baseline: deferred on eval given acuity of surgery 02/21/24: deferred given proximity to surgery Goal status: ONGOING  4. Pt will report at least 50% decrease in overall pain levels in  past week in order to facilitate improved tolerance to basic ADLs/mobility.   Baseline: 5-11.9/10 02/21/24: 0-8/10 Goal status: ONGOING  5. Pt will endorse ability to mow lawn with less than 3  pt increase in pain in order to facilitate improved tolerance to usual tasks.  Baseline: unable to perform  02/21/24: not mowing lawn yet  Goal status: ONGOING  6. Pt will endorse ability to perform usual self-care ADLs such as dressing/hygiene in order to facilitate improved return to PLOF.  Baseline: difficulty w/ upper body dressing, putting on deodorant  02/21/24: still having inc effort with deodorant, difficulty removing pullover shirts  Goal status: PROGRESSING  PLAN:  PT FREQUENCY: 2x/week  PT DURATION: 8 weeks  PLANNED INTERVENTIONS: 97164- PT Re-evaluation, 97750- Physical Performance Testing, 97110-Therapeutic exercises, 97530- Therapeutic activity, [id number]- Neuromuscular re-education, 97535- Self Care, [id number]- Manual therapy, 97016- Vasopneumatic device, 79439 (1-2 muscles), 20561 (3+ muscles)- Dry Needling, Patient/Family education, Taping, Joint mobilization, Spinal mobilization, Scar mobilization, Cryotherapy, and Moist heat  PLAN FOR NEXT SESSION: Review/update HEP PRN. Work on  [facility] mobility, isometrics below shoulder level, periscapular work. Symptom modification strategies as indicated/appropriate.   Alm DELENA Jenny PT, DPT 02/28/2024 10:58 AM

## 2024-02-28 ENCOUNTER — Encounter: Payer: Self-pay | Admitting: Physical Therapy

## 2024-02-28 ENCOUNTER — Ambulatory Visit: Admitting: Physical Therapy

## 2024-02-28 DIAGNOSIS — M6281 Muscle weakness (generalized): Secondary | ICD-10-CM

## 2024-02-28 DIAGNOSIS — M25512 Pain in left shoulder: Secondary | ICD-10-CM

## 2024-02-28 DIAGNOSIS — R600 Localized edema: Secondary | ICD-10-CM

## 2024-03-01 ENCOUNTER — Ambulatory Visit

## 2024-03-01 DIAGNOSIS — M2041 Other hammer toe(s) (acquired), right foot: Secondary | ICD-10-CM | POA: Diagnosis not present

## 2024-03-01 DIAGNOSIS — M2042 Other hammer toe(s) (acquired), left foot: Secondary | ICD-10-CM | POA: Diagnosis not present

## 2024-03-01 DIAGNOSIS — M722 Plantar fascial fibromatosis: Secondary | ICD-10-CM | POA: Diagnosis not present

## 2024-03-01 DIAGNOSIS — E1142 Type 2 diabetes mellitus with diabetic polyneuropathy: Secondary | ICD-10-CM | POA: Diagnosis not present

## 2024-03-01 DIAGNOSIS — B351 Tinea unguium: Secondary | ICD-10-CM | POA: Diagnosis not present

## 2024-03-01 DIAGNOSIS — L84 Corns and callosities: Secondary | ICD-10-CM | POA: Diagnosis not present

## 2024-03-02 ENCOUNTER — Ambulatory Visit: Attending: Orthopaedic Surgery

## 2024-03-02 DIAGNOSIS — M25512 Pain in left shoulder: Secondary | ICD-10-CM | POA: Insufficient documentation

## 2024-03-02 DIAGNOSIS — R600 Localized edema: Secondary | ICD-10-CM | POA: Diagnosis present

## 2024-03-02 DIAGNOSIS — M6281 Muscle weakness (generalized): Secondary | ICD-10-CM | POA: Insufficient documentation

## 2024-03-02 NOTE — Therapy (Signed)
 OUTPATIENT PHYSICAL THERAPY SHOULDER TREATMENT    Patient Name: Gregory DILONE [name] MRN: [id number] DOB:10/10/1950, 73 y.o., male Today's Date: 03/02/2024     END OF SESSION:  PT End of Session - 03/02/24 0848     Visit Number 12    Number of Visits 17    Date for Recertification  03/13/24    Authorization Type [facility]    Authorization - Visit Number 12    Authorization - Number of Visits 15    Progress Note Due on Visit 20    PT Start Time 0848    PT Stop Time 0932    PT Time Calculation (min) 44 min    Activity Tolerance Patient tolerated treatment well    Behavior During Therapy WFL for tasks assessed/performed         Past Medical History:  Diagnosis Date   Allergy not sure   Arthritis    Chest pain    a. Normal cors 2001. b. Neg stress test 2012, 05/2014.   Diabetes mellitus without complication (HCC)    GERD (gastroesophageal reflux disease)    Hyperlipidemia    Hypertension    Hypertensive response to exercise    Hypothyroidism    Obesity    Obstructive sleep apnea    Currently untreated    PONV (postoperative nausea and vomiting)    Sleep apnea    +cpap   Ulcer of left lower extremity (HCC) 12/16/2022   Past Surgical History:  Procedure Laterality Date   CARDIAC CATHETERIZATION     CERVICAL FUSION     HERNIA REPAIR     KNEE ARTHROSCOPY  1984   both knee   [facility]   POSTERIOR LUMBAR FUSION 2 WITH HARDWARE REMOVAL Left 01/04/2024   Procedure: ARTHROSCOPY, SHOULDER WITH DEBRIDEMENT;  Surgeon: Jerri Kay HERO, MD;  Location: [facility];  Service: Orthopedics;  Laterality: Left;  LEFT SHOULDER ARTHROSCOPY, EXTENSIVE DEBRIDEMENT, DISTAL CLAVICLE EXCISION, SUBACROMIAL DECOMPRESSION   SHOULDER ARTHROSCOPY WITH SUBACROMIAL DECOMPRESSION Right 09/13/2019   Procedure: RIGHT SHOULDER ARTHROSCOPY WITH EXTENSIVE DEBRIDEMENT, SUBACROMIAL DECOMPRESSION,;  Surgeon: Vernetta Lonni GRADE, MD;  Location: [facility];  Service: Orthopedics;  Laterality: Right;   SPINE SURGERY  1st Sept 1977   2nd Feb 2011   Patient Active Problem List   Diagnosis Date Noted   Arthritis of left acromioclavicular joint 01/04/2024   Diabetic retinopathy (HCC) 12/29/2023   On long term drug therapy 10/27/2023   Pure hypercholesterolemia 10/27/2023   Viral URI 06/28/2023   Splinter in skin 03/08/2023   COVID 02/02/2023   Bee sting reaction 01/11/2023   Ulcer of left lower extremity (HCC) 12/16/2022   Bilateral hand numbness 10/06/2022   Atypical pigmented lesion 09/29/2022   ED (erectile dysfunction) 09/29/2022   Fusion of spine of cervical region 09/14/2022   Bilateral third flexor stenosing tenosynovitis and first carpometacarpal osteoarthritis. 10/30/2021   Exercise counseling 07/29/2021   Other specified counseling 07/29/2021   Primary open-angle glaucoma, bilateral, mild stage 07/29/2021   Diabetic neuropathy (HCC) 07/31/2020   Abdominal aortic aneurysm 07/31/2020   Insomnia 07/31/2020   GERD (gastroesophageal reflux disease) 01/09/2020   Impingement syndrome of left shoulder 08/02/2019   Interstitial pulmonary disease (HCC) 06/29/2018   Hypertension associated with diabetes (HCC) 07/13/2017   Benign essential tremor 07/13/2017   Kidney cyst, acquired 01/24/2017   Seborrheic keratoses 04/01/2016   Diverticulosis of colon without hemorrhage 02/26/2016   Anxiety state 10/02/2015   BPH (benign  prostatic hyperplasia) 07/31/2015   Carpal tunnel syndrome, left upper limb 06/04/2015   Hypogonadotropic hypogonadism 03/06/2015   Vitamin D  deficiency 03/06/2015   History of pancreatitis 03/06/2015   Lumbar spondylosis 03/04/2015   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   Obstructive sleep apnea 05/15/2014   Hyperlipidemia 05/15/2014   Glaucoma suspect 10/21/2011    PCP: Alvia Bring, DO  REFERRING PROVIDER: Jerri Kay HERO, MD  REFERRING DIAG: [phone]  (ICD-10-CM) - Chronic left shoulder pain M75.42 (ICD-10-CM) - Impingement syndrome of left shoulder  THERAPY DIAG:  Left shoulder pain, unspecified chronicity  Muscle weakness (generalized)  Localized edema  Rationale for Evaluation and Treatment: Rehabilitation  ONSET DATE: scope + DCE/SAD 01/04/24 L shoulder  SUBJECTIVE:                                                                                                                                                                                      SUBJECTIVE STATEMENT: Patient reports pain at anterior shoulder today.   EVAL: Pt endorses chronic history with shoulder injuries, very active in sports, athletics, and [employer]. Worsening over past year prior to surgery. States since surgery pain has been steadily improving, is having difficulty w/ self care tasks, household tasks. Not performing heavier tasks around yard or home. Reports a bit of swelling in shoulder as expected, and bruising that is improving. States surgical follow up went well, reports being advised sling use PRN for comfort and no lifting 5# or greater. States yesterday he did remove one of his steri strips and it had some mild bleeding initially but no issues since. Denies constitutional symptoms (fevers, chills, SOB, etc). Has a bit of tingling in L lateral palm which he states is chronic, unchanged since surgery. Hand dominance: Right  PERTINENT HISTORY: DM, hx chest pain, cervical fusion, lumbar fusion (pt denies), HTN, hypothyroidism, LLE ulcer, R shoulder scope 2021, DM2, essential tremor, diabetic retinopathy Self reports aortic aneurysm, describes as stable and follows with cardiology  PAIN:  Are you having pain: 6/10 at present, 0-9/10 in past week (updated 02/28/24)    Per eval:  Location/description: pinching in front of shoulder, posterior shoulder Best-worst over past week: 5-11.9/10  - aggravating factors: difficulty positioning while sitting, use of arm,  picking up pots/pans, opening jar - Easing factors: sling use, icing, posiitoning    PRECAUTIONS: s/p shoulder scope, SAD, DCE (no formal restrictions on referral).   RED FLAGS: No fevers/chills, no SOB or other constitutional symptoms  WEIGHT BEARING RESTRICTIONS: No  FALLS:  Has patient fallen in last 6 months? 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  DIAGNOSTIC FINDINGS:  S/p L shoulder debridement, SAD, DCE 01/04/24  PATIENT SURVEYS:  QuickDASH: 70.45%   02/21/24 quickDASH: 47.7%   COGNITION: Overall cognitive status: Within functional limits for tasks assessed     SENSATION: Numbness L hand, chronic and stable, unchanging since surgery per pt report  EDEMA/INSPECTION:  Incisions well appearing without drainage or erythema, steri strips over posterior and lateral incisions, anterior does not have steri strip but no apparent opening of incision ; gross edema apparent about L GH joint WNL; mild bruising upper arm which pt states is improving since surgery  UPPER EXTREMITY ROM:  A/PROM Right eval Left eval Left 01/24/24 Left 01/31/24 L 02/07/24 L 02/09/24 L 02/16/24 L 02/21/24 L 02/28/24  Shoulder flexion A: 128 deg 91 deg passively A: 99 deg, mild pain on way down A: 120 deg  A: 125 deg AA: 134 deg  A: 135 deg *  A: 138 deg *   Shoulder abduction       A: 98 deg pain eccentrically A: 99 deg *    Shoulder internal rotation         Functional combo; upper glute  Shoulder external rotation         Functional combo; occiput  Elbow flexion            Elbow extension           Wrist flexion           Wrist extension            (Blank rows = not tested) (Key: WFL = within functional limits not formally assessed, * = concordant pain, s = stiffness/stretching sensation, NT = not tested)  Comments:    UPPER EXTREMITY MMT:  MMT Right eval Left eval 01/31/24 R / L  Shoulder flexion     Shoulder extension     Shoulder abduction     Shoulder extension     Shoulder internal rotation     Shoulder external rotation     Elbow flexion     Elbow extension     Grip strength 55# 65# 55# / 80#  (Blank rows = not tested)  (Key: WFL = within functional limits not formally assessed, * = concordant pain, s = stiffness/stretching sensation, NT = not tested)  Comments: MMT deferred given acuity of surgery   PALPATION:  TTP L UT, LS, rhomboid  [facility] Adult PT Treatment:                                                DATE: 03/02/2024 Manual Therapy: STM pec complex, scapula complex Neuromuscular re-ed: Side Lying:  Shoulder ER 10x3 Sidelying shoulder scaption  Sidelying horizontal abd + tactile cues for scapulohumeral rhythm mechanics Rows + green TB + tactile for scap retract Reactive shoulder ER step out/in + red TB Therapeutic Activity: Shoulder flexion & scaption reaches --> standing & rolling red PB out/in Shoulder horizontal abd/add + rolling red PB  Wall slides --> flexion (fine), scaption (painful - discontinued)    [facility] Adult PT Treatment:                                                DATE: 02/28/24 Therapeutic Exercise: Shoulder flexion walkbacks x12  Shoulder abduction walkback x12  Standing swiss ball horiz abd/add x12  Sidelying L GH ER x12 (3 sec iso at top) cues for positioning  Sidelying shoulder scaption LUE, x8 cues to mitigate excessive anterior compression Sidelying horizontal abd LUE x8   HEP education/discussion  Neuromuscular re-ed: Green band row 2x8 cues for posture RB ER iso walkout x12  RB IR iso walkout x12     [facility] Adult PT Treatment:                                                DATE: 02/21/24 Therapeutic Exercise: Standing swiss ball abduction AAROM 2x12  Standing swiss ball horizontal add/abd AAROM 2x8 cues for comfortable ROM HEP update + education/handout   Neuromuscular re-ed: ER iso walkout, RB x10 LUE IR iso walkout RB x10  Green band row x10 cues for appropriate periscap activation  Therapeutic Activity: MSK assessment + education Education/discussion re: progress with PT, symptom behavior as it affects activity tolerance, PT goals/POC     [facility] Adult PT Treatment:                                                DATE: 02/16/24 Therapeutic Exercise: Seated pulley flexion x39min AAROM Seated pulley scaption x48min AAROM Standing swiss ball abduction AAROM 2x8 LUE cues for comfortable ROM and positioning Standing swiss ball flexion AAROM 3x10 LUE  HEP update + education/handout   Neuromuscular re-ed: ER isometric 2x8 LUE cues for setup and comfortable force output IR isometric 2x8 LUE cues for appropriate force output Ulnar nerve glides LUE x10  Therapeutic Activity: Seated scaption AROM 2x10 within comfortable ROM cues for posture, for functional reaching tolerance Seated flexion AROM 2x8 within comfortable ROM  ROM measurements + education re: progression with functional use within surgical protocol   PATIENT EDUCATION: Education details: self care as above Person educated: Patient Education method: Programmer, multimedia, Demonstration, Actor cues, Verbal cues Education comprehension: verbalized understanding, returned demonstration, verbal cues required, tactile cues required, and needs further education      HOME EXERCISE PROGRAM: Access Code: [email or web address] URL: [email or web address] Date: 02/21/2024 Prepared  by: Alm Jenny  Exercises - Seated Shoulder Abduction Towel Slide at Table Top  - 2-3 x daily - 1 sets - 10 reps - Standing Shoulder Flexion to 90 Degrees with Dumbbells  - 2-3 x daily - 1 sets - 8-10 reps - Standing Shoulder Row Reactive Isometric  - 2-3 x daily - 1 sets - 10 reps - Shoulder Flexion Wall Slide with Towel  - 2-3 x daily - 1 sets - 8 reps - Standing Isometric Shoulder Internal Rotation at Doorway  - 3-4 x weekly - 2-3 sets - 6-8 reps - Standing Isometric Shoulder External Rotation with Doorway  - 3-4 x weekly - 2-3 sets - 6-8 reps  ASSESSMENT:  CLINICAL IMPRESSION:  Shoulder mobility and strengthening exercises continued as tolerated. Tactile cues improved scapulohumeral rhythm mechanics; patient has difficulty maintaining cues independently. Intermittent pain reported in anterior/posterior shoulder with standing resisted postural exercises. Patient will continue to benefit from skilled therapy to address pain and mobility deficits.       EVAL: Patient is a 73 y.o. gentleman who was seen today for physical therapy evaluation and treatment for L shoulder s/p debridement, SAD, DCE 01/04/24. No red flags or signs of post op complications today, did encourage monitoring of his incision as he states he removed anterior steri strip yesterday. Endorses difficulty with household tasks and limiting heavier tasks as expected post op. On exam he demonstrates limitations in passive mobility secondary to transient pain. Tolerates exam/HEP well overall without adverse event, HEP and education as above. Recommend trial of skilled PT to address aforementioned deficits with aim of improving functional tolerance and reducing pain with typical activities. Pt departs today's session in no acute distress, all voiced concerns/questions addressed appropriately from PT perspective.      OBJECTIVE IMPAIRMENTS: decreased activity tolerance, decreased endurance, decreased mobility, decreased ROM, decreased strength,  impaired perceived functional ability, impaired UE functional use, improper body mechanics, postural dysfunction, and pain.   ACTIVITY LIMITATIONS: carrying, lifting, dressing, reach over head, and hygiene/grooming  PARTICIPATION LIMITATIONS: meal prep, cleaning, laundry, driving, shopping, and community activity  PERSONAL FACTORS: Age, Time since onset of injury/illness/exacerbation, and 3+ comorbidities: hx chest pain, cervical fusion, lumbarfusion, HTN, hypothyroidism, LLE ulcer, R shoulder scope 2021, DM2, essential tremor, diabetic retinopathy are also affecting patient's functional outcome.   REHAB POTENTIAL: Good  CLINICAL DECISION MAKING: Stable/uncomplicated  EVALUATION COMPLEXITY: Low   GOALS:  SHORT TERM GOALS: Target date: 02/14/2024  Pt will demonstrate appropriate understanding and performance of initially prescribed HEP in order to facilitate improved independence with management of symptoms.  Baseline: HEP established  02/14/24: reports good HEP adherence  Goal status: MET  2. Pt will report at least 25% improvement in overall pain levels over past week in order to facilitate improved tolerance to typical daily activities.   Baseline: 5-11.910 02/21/24: 0-8/10 Goal status: ONGOING   LONG TERM GOALS: Target date: 03/13/2024   Pt will score less than or equal to 50% on Quick DASH in order to indicate reduced levels of disability due to shoulder pain (MDC 16-20pts).  Baseline: 70.45%   02/21/24: 47.7%  Goal status: MET  2.  Pt will demonstrate at least 130 degrees of active L shoulder elevation in order to demonstrate improved tolerance to functional movement patterns such as reaching overhead.  Baseline: see ROM chart above 02/21/24: see ROM chart above (flexion >130 deg, abduction <100) Goal status: PARTIALLY MET   3.  Pt will demonstrate at least 4+/5 shoulder flex/abd MMT for  improved symmetry of UE strength and improved tolerance to functional movements.   Baseline: deferred on eval given acuity of surgery 02/21/24: deferred given proximity to surgery Goal status: ONGOING  4. Pt will report at least 50% decrease in overall pain levels in past week in order to facilitate improved tolerance to basic ADLs/mobility.   Baseline: 5-11.9/10 02/21/24: 0-8/10 Goal status: ONGOING  5. Pt will endorse ability to mow lawn with less than 3 pt increase in pain in order to facilitate improved tolerance to usual tasks.  Baseline: unable to perform  02/21/24: not mowing lawn yet  Goal status: ONGOING  6. Pt will endorse ability to perform usual self-care ADLs such as dressing/hygiene in order to facilitate improved return to PLOF.  Baseline: difficulty w/ upper body dressing, putting on deodorant  02/21/24: still having inc effort with deodorant, difficulty removing pullover shirts  Goal status: PROGRESSING  PLAN:  PT FREQUENCY: 2x/week  PT DURATION: 8 weeks  PLANNED INTERVENTIONS: 97164- PT Re-evaluation, 97750- Physical Performance Testing, 97110-Therapeutic exercises, 97530- Therapeutic activity, [id number]- Neuromuscular re-education, 97535- Self Care, [id number]- Manual therapy, 97016- Vasopneumatic device, 79439 (1-2 muscles), 20561 (3+ muscles)- Dry Needling, Patient/Family education, Taping, Joint mobilization, Spinal mobilization, Scar mobilization, Cryotherapy, and Moist heat  PLAN FOR NEXT SESSION: Review/update HEP PRN. Work on [facility] mobility, isometrics below shoulder level, periscapular work. Symptom modification strategies as indicated/appropriate.    Gregory West, PTA 03/02/2024 9:35 AM

## 2024-03-05 NOTE — Therapy (Signed)
 OUTPATIENT PHYSICAL THERAPY SHOULDER TREATMENT    Patient Name: Gregory BRISENDINE [name] MRN: [id number] DOB:02-08-1951, 73 y.o., male Today's Date: 03/06/2024     END OF SESSION:  PT End of Session - 03/06/24 1102     Visit Number 13    Number of Visits 17    Date for Recertification  03/13/24    Authorization Type [facility]    Authorization - Visit Number 13    Authorization - Number of Visits 15    Progress Note Due on Visit 20    PT Start Time 1102    PT Stop Time 1141    PT Time Calculation (min) 39 min          Past Medical History:  Diagnosis Date   Allergy not sure   Arthritis    Chest pain    a. Normal cors 2001. b. Neg stress test 2012, 05/2014.   Diabetes mellitus without complication (HCC)    GERD (gastroesophageal reflux disease)    Hyperlipidemia    Hypertension    Hypertensive response to exercise    Hypothyroidism    Obesity    Obstructive sleep apnea    Currently untreated    PONV (postoperative nausea and vomiting)    Sleep apnea    +cpap   Ulcer of left lower extremity (HCC) 12/16/2022   Past Surgical History:  Procedure Laterality Date   CARDIAC CATHETERIZATION     CERVICAL FUSION     HERNIA REPAIR     KNEE ARTHROSCOPY  1984   both knee   [facility]   POSTERIOR LUMBAR FUSION 2 WITH HARDWARE REMOVAL Left 01/04/2024   Procedure: ARTHROSCOPY, SHOULDER WITH DEBRIDEMENT;  Surgeon: Jerri Kay HERO, MD;  Location: [facility];  Service: Orthopedics;  Laterality: Left;  LEFT SHOULDER ARTHROSCOPY, EXTENSIVE DEBRIDEMENT, DISTAL CLAVICLE EXCISION, SUBACROMIAL DECOMPRESSION   SHOULDER ARTHROSCOPY WITH SUBACROMIAL DECOMPRESSION Right 09/13/2019   Procedure: RIGHT SHOULDER ARTHROSCOPY WITH EXTENSIVE DEBRIDEMENT, SUBACROMIAL DECOMPRESSION,;  Surgeon: Vernetta Lonni GRADE, MD;  Location: [facility];  Service: Orthopedics;  Laterality: Right;   SPINE SURGERY  1st Sept 1977   2nd Feb 2011   Patient Active  Problem List   Diagnosis Date Noted   Arthritis of left acromioclavicular joint 01/04/2024   Diabetic retinopathy (HCC) 12/29/2023   On long term drug therapy 10/27/2023   Pure hypercholesterolemia 10/27/2023   Viral URI 06/28/2023   Splinter in skin 03/08/2023   COVID 02/02/2023   Bee sting reaction 01/11/2023   Ulcer of left lower extremity (HCC) 12/16/2022   Bilateral hand numbness 10/06/2022   Atypical pigmented lesion 09/29/2022   ED (erectile dysfunction) 09/29/2022   Fusion of spine of cervical region 09/14/2022   Bilateral third flexor stenosing tenosynovitis and first carpometacarpal osteoarthritis. 10/30/2021   Exercise counseling 07/29/2021   Other specified counseling 07/29/2021   Primary open-angle glaucoma, bilateral, mild stage 07/29/2021   Diabetic neuropathy (HCC) 07/31/2020   Abdominal aortic aneurysm 07/31/2020   Insomnia 07/31/2020   GERD (gastroesophageal reflux disease) 01/09/2020   Impingement syndrome of left shoulder 08/02/2019   Interstitial pulmonary disease (HCC) 06/29/2018   Hypertension associated with diabetes (HCC) 07/13/2017   Benign essential tremor 07/13/2017   Kidney cyst, acquired 01/24/2017   Seborrheic keratoses 04/01/2016   Diverticulosis of colon without hemorrhage 02/26/2016   Anxiety state 10/02/2015   BPH (benign prostatic hyperplasia) 07/31/2015   Carpal tunnel syndrome, left upper limb 06/04/2015   Hypogonadotropic hypogonadism 03/06/2015  Vitamin D  deficiency 03/06/2015   History of pancreatitis 03/06/2015   Lumbar spondylosis 03/04/2015   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   Obstructive sleep apnea 05/15/2014   Hyperlipidemia 05/15/2014   Glaucoma suspect 10/21/2011    PCP: Alvia Bring, DO  REFERRING PROVIDER: Jerri Kay HERO, MD  REFERRING DIAG: [phone] (ICD-10-CM) - Chronic left shoulder pain M75.42 (ICD-10-CM) - Impingement syndrome of left shoulder  THERAPY DIAG:   Left shoulder pain, unspecified chronicity  Muscle weakness (generalized)  Localized edema  Rationale for Evaluation and Treatment: Rehabilitation  ONSET DATE: scope + DCE/SAD 01/04/24 L shoulder  SUBJECTIVE:                                                                                                                                                                                      SUBJECTIVE STATEMENT: 03/06/2024: states his shoulder is a little sore today  - mowed his lawn yesterday (self propelled push mower). Over the weekend had a bit of aching around rotator cuff. A bit sore after last session but also had a busy weekend.   EVAL: Pt endorses chronic history with shoulder injuries, very active in sports, athletics, and [employer]. Worsening over past year prior to surgery. States since surgery pain has been steadily improving, is having difficulty w/ self care tasks, household tasks. Not performing heavier tasks around yard or home. Reports a bit of swelling in shoulder as expected, and bruising that is improving. States surgical follow up went well, reports being advised sling use PRN for comfort and no lifting 5# or greater. States yesterday he did remove one of his steri strips and it had some mild bleeding initially but no issues since. Denies constitutional symptoms (fevers, chills, SOB, etc). Has a bit of tingling in L lateral palm which he states is chronic, unchanged since surgery. Hand dominance: Right  PERTINENT HISTORY: DM, hx chest pain, cervical fusion, lumbar fusion (pt denies), HTN, hypothyroidism, LLE ulcer, R shoulder scope 2021, DM2, essential tremor, diabetic retinopathy Self reports aortic aneurysm, describes as stable and follows with cardiology  PAIN:  Are you having pain: 6/10 at present, 0-9/10 in past week (updated 03/06/24)    Per eval:  Location/description: pinching in front of shoulder, posterior shoulder Best-worst over past week: 5-11.9/10  -  aggravating factors: difficulty positioning while sitting, use of arm, picking up pots/pans, opening jar - Easing factors: sling use, icing, posiitoning    PRECAUTIONS: s/p shoulder scope, SAD, DCE (no formal restrictions on referral).   RED FLAGS: No fevers/chills, no SOB or other constitutional symptoms  WEIGHT BEARING RESTRICTIONS: No  FALLS:  Has patient fallen in last 6  months? 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  DIAGNOSTIC FINDINGS:  S/p L shoulder debridement, SAD, DCE 01/04/24  PATIENT SURVEYS:  QuickDASH: 70.45%   02/21/24 quickDASH: 47.7%   COGNITION: Overall cognitive status: Within functional limits for tasks assessed     SENSATION: Numbness L hand, chronic and stable, unchanging since surgery per pt report  EDEMA/INSPECTION:  Incisions well appearing without drainage or erythema, steri strips over posterior and lateral incisions, anterior does not have steri strip but no apparent opening of incision ; gross edema apparent about L GH joint WNL; mild bruising upper arm which pt states is improving since surgery  UPPER EXTREMITY ROM:  A/PROM Right eval Left eval Left 01/24/24 Left 01/31/24 L 02/07/24 L 02/09/24 L 02/16/24 L 02/21/24 L 02/28/24 L 03/06/24  Shoulder flexion A: 128 deg 91 deg passively A: 99 deg, mild pain on way down A: 120 deg  A: 125 deg AA: 134 deg  A: 135 deg *  A: 138 deg *    Shoulder abduction       A: 98 deg pain eccentrically A: 99 deg *     Shoulder internal rotation         Functional combo; upper glute  Functional combo: belt line  Shoulder external rotation         Functional combo; occiput Functional combo: C4  Elbow flexion            Elbow extension            Wrist flexion            Wrist extension             (Blank rows = not tested) (Key: WFL = within functional limits not formally assessed, * = concordant pain, s = stiffness/stretching sensation, NT = not tested)  Comments:    UPPER EXTREMITY MMT:  MMT Right eval Left eval 01/31/24 R / L  Shoulder flexion     Shoulder extension     Shoulder abduction     Shoulder extension     Shoulder internal rotation     Shoulder external rotation     Elbow flexion     Elbow extension     Grip strength 55# 65# 55# / 80#  (Blank rows = not tested)  (Key: WFL = within functional limits not formally assessed, * = concordant pain, s = stiffness/stretching sensation, NT = not tested)  Comments: MMT deferred given acuity of surgery   PALPATION:  TTP L UT, LS, rhomboid  [facility] Adult PT Treatment:                                                DATE: 03/06/24 Therapeutic Exercise: Swiss ball flexion up wall 3x8 cues for comfortable ROM  Sidelying ER 1# 2x8 cues for comfortable ROM Green band row x8 cues for posture HEP discussion/education  Neuromuscular re-ed: Sidelying horizontal abduction 2x10 second round cues for inc volitional UE muscle contraction (improved tolerance noted) Sidelying shoulder flexion LUE x8 cues for form and control RB shoulder extension x8 cues for lat activation and posture    [facility] Adult PT Treatment:                                                DATE: 03/02/2024 Manual Therapy: STM pec complex, scapula complex Neuromuscular re-ed: Side Lying:  Shoulder ER 10x3 Sidelying shoulder scaption  Sidelying horizontal abd + tactile cues for scapulohumeral rhythm mechanics Rows + green  TB + tactile for scap retract Reactive shoulder ER step out/in + red TB Therapeutic Activity: Shoulder flexion & scaption reaches --> standing & rolling red PB out/in Shoulder horizontal abd/add + rolling red PB  Wall slides --> flexion (fine), scaption (painful - discontinued)    [facility] Adult PT Treatment:                                                DATE: 02/28/24 Therapeutic Exercise: Shoulder flexion walkbacks x12  Shoulder abduction walkback x12  Standing swiss ball horiz abd/add x12  Sidelying L GH ER x12 (3 sec iso at top) cues for positioning  Sidelying shoulder scaption LUE, x8 cues to mitigate excessive anterior compression Sidelying horizontal abd LUE x8  HEP education/discussion  Neuromuscular re-ed: Green band row 2x8 cues for posture RB ER iso walkout x12  RB IR iso walkout x12     PATIENT EDUCATION: Education details: self care as above Person educated: Patient Education method: Programmer, multimedia, Facilities manager, Actor cues, Verbal cues Education comprehension: verbalized understanding, returned demonstration, verbal cues required, tactile cues required, and needs further education      HOME EXERCISE PROGRAM: Access Code: [email or web address] URL: [email or web address] Date: 02/21/2024 Prepared by: Alm Jenny  Exercises - Seated Shoulder Abduction Towel Slide at Table Top  - 2-3 x daily - 1 sets - 10 reps - Standing Shoulder Flexion to 90 Degrees with Dumbbells  - 2-3 x daily - 1 sets - 8-10 reps - Standing Shoulder Row Reactive Isometric  - 2-3 x daily - 1 sets - 10 reps - Shoulder Flexion Wall Slide with Towel  - 2-3 x daily - 1 sets - 8 reps - Standing Isometric Shoulder Internal Rotation at Doorway  - 3-4 x weekly - 2-3 sets - 6-8 reps - Standing Isometric Shoulder External Rotation with Doorway  - 3-4 x weekly - 2-3 sets - 6-8 reps  ASSESSMENT:  CLINICAL IMPRESSION:  03/06/2024: Pt arrives w/ baseline symptoms, continues to endorse slow/steady  progress, noted improvement in functional reaching today (noted above). Today focusing on gradual progression with light strengthening and improving GH coordination/control into elevation. He tolerates  this well overall, some mild transient increases in pain throughout. Reports improving symptoms overall as session goes on however and no pain on departure. Cues as above. No adverse events. Recommend continuing along current POC in order to address relevant deficits and improve functional tolerance. Pt departs today's session in no acute distress, all voiced questions/concerns addressed appropriately from PT perspective.    EVAL: Patient is a 73 y.o. gentleman who was seen today for physical therapy evaluation and treatment for L shoulder s/p debridement, SAD, DCE 01/04/24. No red flags or signs of post op complications today, did encourage monitoring of his incision as he states he removed anterior steri strip yesterday. Endorses difficulty with household tasks and limiting heavier tasks as expected post op. On exam he demonstrates limitations in passive mobility secondary to transient pain. Tolerates exam/HEP well overall without adverse event, HEP and education as above. Recommend trial of skilled PT to address aforementioned deficits with aim of improving functional tolerance and reducing pain with typical activities. Pt departs today's session in no acute distress, all voiced concerns/questions addressed appropriately from PT perspective.      OBJECTIVE IMPAIRMENTS: decreased activity tolerance, decreased endurance, decreased mobility, decreased ROM, decreased strength, impaired perceived functional ability, impaired UE functional use, improper body mechanics, postural dysfunction, and pain.   ACTIVITY LIMITATIONS: carrying, lifting, dressing, reach over head, and hygiene/grooming  PARTICIPATION LIMITATIONS: meal prep, cleaning, laundry, driving, shopping, and community activity  PERSONAL FACTORS: Age,  Time since onset of injury/illness/exacerbation, and 3+ comorbidities: hx chest pain, cervical fusion, lumbarfusion, HTN, hypothyroidism, LLE ulcer, R shoulder scope 2021, DM2, essential tremor, diabetic retinopathy are also affecting patient's functional outcome.   REHAB POTENTIAL: Good  CLINICAL DECISION MAKING: Stable/uncomplicated  EVALUATION COMPLEXITY: Low   GOALS:  SHORT TERM GOALS: Target date: 02/14/2024  Pt will demonstrate appropriate understanding and performance of initially prescribed HEP in order to facilitate improved independence with management of symptoms.  Baseline: HEP established  02/14/24: reports good HEP adherence  Goal status: MET  2. Pt will report at least 25% improvement in overall pain levels over past week in order to facilitate improved tolerance to typical daily activities.   Baseline: 5-11.910 02/21/24: 0-8/10 Goal status: ONGOING   LONG TERM GOALS: Target date: 03/13/2024   Pt will score less than or equal to 50% on Quick DASH in order to indicate reduced levels of disability due to shoulder pain (MDC 16-20pts).  Baseline: 70.45%   02/21/24: 47.7%  Goal status: MET  2.  Pt will demonstrate at least 130 degrees of active L shoulder elevation in order to demonstrate improved tolerance to functional movement patterns such as reaching overhead.  Baseline: see ROM chart above 02/21/24: see ROM chart above (flexion >130 deg, abduction <100) Goal status: PARTIALLY MET   3.  Pt will demonstrate at least 4+/5 shoulder flex/abd MMT for improved symmetry of UE strength and improved tolerance to functional movements.  Baseline: deferred on eval given acuity of surgery 02/21/24: deferred given proximity to surgery Goal status: ONGOING  4. Pt will report at least 50% decrease in overall pain levels in past week in order to facilitate improved tolerance to basic ADLs/mobility.   Baseline: 5-11.9/10 02/21/24: 0-8/10 Goal status: ONGOING  5. Pt will endorse  ability to mow lawn with less than 3 pt increase in pain in order to facilitate improved tolerance to usual tasks.  Baseline: unable to perform  02/21/24: not mowing lawn yet  Goal status: ONGOING  6. Pt will endorse ability to  perform usual self-care ADLs such as dressing/hygiene in order to facilitate improved return to PLOF.  Baseline: difficulty w/ upper body dressing, putting on deodorant  02/21/24: still having inc effort with deodorant, difficulty removing pullover shirts  Goal status: PROGRESSING  PLAN:  PT FREQUENCY: 2x/week  PT DURATION: 8 weeks  PLANNED INTERVENTIONS: 97164- PT Re-evaluation, 97750- Physical Performance Testing, 97110-Therapeutic exercises, 97530- Therapeutic activity, [id number]- Neuromuscular re-education, 97535- Self Care, [id number]- Manual therapy, 97016- Vasopneumatic device, 79439 (1-2 muscles), 20561 (3+ muscles)- Dry Needling, Patient/Family education, Taping, Joint mobilization, Spinal mobilization, Scar mobilization, Cryotherapy, and Moist heat  PLAN FOR NEXT SESSION: Review/update HEP PRN. Work on [facility] mobility, isometrics below shoulder level, periscapular work. Symptom modification strategies as indicated/appropriate.    Alm DELENA Jenny PT, DPT 03/06/2024 11:43 AM

## 2024-03-06 ENCOUNTER — Encounter: Payer: Self-pay | Admitting: Physical Therapy

## 2024-03-06 ENCOUNTER — Ambulatory Visit: Admitting: Physical Therapy

## 2024-03-06 DIAGNOSIS — R600 Localized edema: Secondary | ICD-10-CM | POA: Diagnosis not present

## 2024-03-06 DIAGNOSIS — M25512 Pain in left shoulder: Secondary | ICD-10-CM | POA: Diagnosis not present

## 2024-03-06 DIAGNOSIS — M6281 Muscle weakness (generalized): Secondary | ICD-10-CM | POA: Diagnosis not present

## 2024-03-08 ENCOUNTER — Ambulatory Visit

## 2024-03-08 DIAGNOSIS — R600 Localized edema: Secondary | ICD-10-CM

## 2024-03-08 DIAGNOSIS — M6281 Muscle weakness (generalized): Secondary | ICD-10-CM

## 2024-03-08 DIAGNOSIS — M25512 Pain in left shoulder: Secondary | ICD-10-CM | POA: Diagnosis not present

## 2024-03-08 NOTE — Therapy (Signed)
 OUTPATIENT PHYSICAL THERAPY SHOULDER TREATMENT    Patient Name: Gregory STONEKING [name] MRN: [id number] DOB:Feb 21, 1951, 73 y.o., male Today's Date: 03/08/2024     END OF SESSION:  PT End of Session - 03/08/24 1020     Visit Number 14    Number of Visits 17    Date for Recertification  03/13/24    Authorization Type [facility]    Authorization - Visit Number 14    Authorization - Number of Visits 15    Progress Note Due on Visit 20    PT Start Time 1020    PT Stop Time 1108    PT Time Calculation (min) 48 min    Activity Tolerance Patient tolerated treatment well    Behavior During Therapy WFL for tasks assessed/performed         Past Medical History:  Diagnosis Date   Allergy not sure   Arthritis    Chest pain    a. Normal cors 2001. b. Neg stress test 2012, 05/2014.   Diabetes mellitus without complication (HCC)    GERD (gastroesophageal reflux disease)    Hyperlipidemia    Hypertension    Hypertensive response to exercise    Hypothyroidism    Obesity    Obstructive sleep apnea    Currently untreated    PONV (postoperative nausea and vomiting)    Sleep apnea    +cpap   Ulcer of left lower extremity (HCC) 12/16/2022   Past Surgical History:  Procedure Laterality Date   CARDIAC CATHETERIZATION     CERVICAL FUSION     HERNIA REPAIR     KNEE ARTHROSCOPY  1984   both knee   [facility]   POSTERIOR LUMBAR FUSION 2 WITH HARDWARE REMOVAL Left 01/04/2024   Procedure: ARTHROSCOPY, SHOULDER WITH DEBRIDEMENT;  Surgeon: Jerri Kay HERO, MD;  Location: [facility];  Service: Orthopedics;  Laterality: Left;  LEFT SHOULDER ARTHROSCOPY, EXTENSIVE DEBRIDEMENT, DISTAL CLAVICLE EXCISION, SUBACROMIAL DECOMPRESSION   SHOULDER ARTHROSCOPY WITH SUBACROMIAL DECOMPRESSION Right 09/13/2019   Procedure: RIGHT SHOULDER ARTHROSCOPY WITH EXTENSIVE DEBRIDEMENT, SUBACROMIAL DECOMPRESSION,;  Surgeon: Vernetta Lonni GRADE, MD;  Location: [facility];  Service: Orthopedics;  Laterality: Right;   SPINE SURGERY  1st Sept 1977   2nd Feb 2011   Patient Active Problem List   Diagnosis Date Noted   Arthritis of left acromioclavicular joint 01/04/2024   Diabetic retinopathy (HCC) 12/29/2023   On long term drug therapy 10/27/2023   Pure hypercholesterolemia 10/27/2023   Viral URI 06/28/2023   Splinter in skin 03/08/2023   COVID 02/02/2023   Bee sting reaction 01/11/2023   Ulcer of left lower extremity (HCC) 12/16/2022   Bilateral hand numbness 10/06/2022   Atypical pigmented lesion 09/29/2022   ED (erectile dysfunction) 09/29/2022   Fusion of spine of cervical region 09/14/2022   Bilateral third flexor stenosing tenosynovitis and first carpometacarpal osteoarthritis. 10/30/2021   Exercise counseling 07/29/2021   Other specified counseling 07/29/2021   Primary open-angle glaucoma, bilateral, mild stage 07/29/2021   Diabetic neuropathy (HCC) 07/31/2020   Abdominal aortic aneurysm 07/31/2020   Insomnia 07/31/2020   GERD (gastroesophageal reflux disease) 01/09/2020   Impingement syndrome of left shoulder 08/02/2019   Interstitial pulmonary disease (HCC) 06/29/2018   Hypertension associated with diabetes (HCC) 07/13/2017   Benign essential tremor 07/13/2017   Kidney cyst, acquired 01/24/2017   Seborrheic keratoses 04/01/2016   Diverticulosis of colon without hemorrhage 02/26/2016   Anxiety state 10/02/2015   BPH (benign  prostatic hyperplasia) 07/31/2015   Carpal tunnel syndrome, left upper limb 06/04/2015   Hypogonadotropic hypogonadism 03/06/2015   Vitamin D  deficiency 03/06/2015   History of pancreatitis 03/06/2015   Lumbar spondylosis 03/04/2015   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   Obstructive sleep apnea 05/15/2014   Hyperlipidemia 05/15/2014   Glaucoma suspect 10/21/2011    PCP: Alvia Bring, DO  REFERRING PROVIDER: Jerri Kay HERO, MD  REFERRING DIAG: [phone]  (ICD-10-CM) - Chronic left shoulder pain M75.42 (ICD-10-CM) - Impingement syndrome of left shoulder  THERAPY DIAG:  Left shoulder pain, unspecified chronicity  Muscle weakness (generalized)  Localized edema  Rationale for Evaluation and Treatment: Rehabilitation  ONSET DATE: scope + DCE/SAD 01/04/24 L shoulder  SUBJECTIVE:                                                                                                                                                                                      SUBJECTIVE STATEMENT: Patient reports his shoulder is very sore today, states 7/10 pain - fluctuates between 6-9/10 depending on the movement.  EVAL: Pt endorses chronic history with shoulder injuries, very active in sports, athletics, and [employer]. Worsening over past year prior to surgery. States since surgery pain has been steadily improving, is having difficulty w/ self care tasks, household tasks. Not performing heavier tasks around yard or home. Reports a bit of swelling in shoulder as expected, and bruising that is improving. States surgical follow up went well, reports being advised sling use PRN for comfort and no lifting 5# or greater. States yesterday he did remove one of his steri strips and it had some mild bleeding initially but no issues since. Denies constitutional symptoms (fevers, chills, SOB, etc). Has a bit of tingling in L lateral palm which he states is chronic, unchanged since surgery. Hand dominance: Right  PERTINENT HISTORY: DM, hx chest pain, cervical fusion, lumbar fusion (pt denies), HTN, hypothyroidism, LLE ulcer, R shoulder scope 2021, DM2, essential tremor, diabetic retinopathy Self reports aortic aneurysm, describes as stable and follows with cardiology  PAIN:  Are you having pain: 7/10 at present, 6-9/10 in past week (updated 03/08/24)    Per eval:  Location/description: pinching in front of shoulder, posterior shoulder Best-worst over past week: 5-11.9/10   - aggravating factors: difficulty positioning while sitting, use of arm, picking up pots/pans, opening jar - Easing factors: sling use, icing, posiitoning    PRECAUTIONS: s/p shoulder scope, SAD, DCE (no formal restrictions on referral).   RED FLAGS: No fevers/chills, no SOB or other constitutional symptoms  WEIGHT BEARING RESTRICTIONS: No  FALLS:  Has patient fallen in last 6 months? 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  DIAGNOSTIC FINDINGS:  S/p L shoulder debridement, SAD, DCE 01/04/24  PATIENT SURVEYS:  QuickDASH: 70.45%   02/21/24 quickDASH: 47.7%   COGNITION: Overall cognitive status: Within functional limits for tasks assessed     SENSATION: Numbness L hand, chronic and stable, unchanging since surgery per pt report  EDEMA/INSPECTION:  Incisions well appearing without drainage or erythema, steri strips over posterior and lateral incisions, anterior does not have steri strip but no apparent opening of incision ; gross edema apparent about L GH joint WNL; mild bruising upper arm which pt states is improving since surgery  UPPER EXTREMITY ROM:  A/PROM Right eval Left eval Left 01/24/24 Left 01/31/24 L 02/07/24 L 02/09/24 L 02/16/24 L 02/21/24 L 02/28/24 L 03/06/24  Shoulder flexion A: 128 deg 91 deg passively A: 99 deg, mild pain on way down A: 120 deg  A: 125 deg AA: 134 deg  A: 135 deg *  A: 138 deg *    Shoulder abduction       A: 98 deg pain eccentrically A: 99 deg *     Shoulder internal rotation         Functional combo; upper  glute Functional combo: belt line  Shoulder external rotation         Functional combo; occiput Functional combo: C4  Elbow flexion            Elbow extension            Wrist flexion            Wrist extension             (Blank rows = not tested) (Key: WFL = within functional limits not formally assessed, * = concordant pain, s = stiffness/stretching sensation, NT = not tested)  Comments:    UPPER EXTREMITY MMT:  MMT Right eval Left eval 01/31/24 R / L  Shoulder flexion     Shoulder extension     Shoulder abduction     Shoulder extension     Shoulder internal rotation     Shoulder external rotation     Elbow flexion     Elbow extension     Grip strength 55# 65# 55# / 80#  (Blank rows = not tested)  (Key: WFL = within functional limits not formally assessed, * = concordant pain, s = stiffness/stretching sensation, NT = not tested)  Comments: MMT deferred given acuity of surgery   PALPATION:  TTP L UT, LS, rhomboid  [facility] Adult PT Treatment:                                                DATE: 03/08/2024 Manual Therapy: STM (Lt) upper trap, levator scap, cervical paraspinals Neuromuscular re-ed: Side Lying: Shoulder ER x6 --> added 1#DB x6 --> added 3 sec hold x10 Shoulder abd + tactile cues for scapulohumeral rhythm Seated:  Scapula retraction + 10 sec hold Shoulder ER with scapula retraction + 5 sec hold --> added yellow TB + 3 sec hold Shoulder extension press down to neutral + red TB at mid level Rows to neutral + green TB with 3 sec hold Therapeutic Activity: Standing shoulder flexion & scaption, horizontal abd/add slides in standing with red PB 2x10 each Doorway pec stretch --> low 2x30 sec, 60 degrees 2 x 30 sec Pin & stretch upper trap (Lt)    [facility] Adult PT Treatment:                                                DATE:  03/06/24 Therapeutic Exercise: Swiss ball flexion up wall 3x8 cues for comfortable ROM  Sidelying ER 1# 2x8 cues for comfortable ROM Green band row x8 cues for posture HEP discussion/education  Neuromuscular re-ed: Sidelying horizontal abduction 2x10 second round cues for inc volitional UE muscle contraction (improved tolerance noted) Sidelying shoulder flexion LUE x8 cues for form and control RB shoulder extension x8 cues for lat activation and posture    [facility] Adult PT Treatment:                                                DATE: 03/02/2024 Manual Therapy: STM pec complex, scapula complex Neuromuscular re-ed: Side Lying:  Shoulder ER 10x3 Sidelying shoulder scaption  Sidelying horizontal abd + tactile cues for scapulohumeral rhythm mechanics Rows + green TB + tactile for scap retract Reactive shoulder ER step out/in + red TB Therapeutic Activity: Shoulder flexion & scaption reaches --> standing & rolling red PB out/in Shoulder horizontal abd/add + rolling red PB  Wall slides --> flexion (fine), scaption (painful - discontinued)    [facility] Adult PT Treatment:                                                DATE: 02/28/24 Therapeutic Exercise: Shoulder flexion walkbacks x12  Shoulder abduction walkback x12  Standing swiss ball horiz abd/add x12  Sidelying L GH ER x12 (3 sec iso at top) cues for positioning  Sidelying shoulder scaption LUE, x8 cues to mitigate excessive anterior compression Sidelying horizontal abd LUE x8  HEP education/discussion  Neuromuscular re-ed: Green band row 2x8 cues for posture RB ER iso walkout x12  RB IR iso walkout x12     PATIENT EDUCATION: Education details: Updated HEP Person educated: Patient Education method: Explanation, Demonstration, Actor cues, Verbal cues Education comprehension: verbalized understanding, returned demonstration, verbal cues required, tactile cues required, and needs further education  HOME EXERCISE  PROGRAM: Access Code: [email or web address] URL: [email or web address] Date: 03/08/2024 Prepared by: Lamarr Price  Exercises - Shoulder Flexion Wall Slide with Towel  - 2-3 x daily - 1 sets - 8 reps - Standing Isometric Shoulder Internal Rotation at Doorway  - 3-4 x weekly - 2-3 sets - 6-8 reps - Standing Isometric Shoulder External Rotation with Doorway  - 3-4 x weekly - 2-3 sets - 6-8 reps - Seated Scapular Retraction  - 1-2 x daily - 7 x weekly - 1-2 sets - 10 reps - 10 sec hold - Shoulder External Rotation and Scapular Retraction with Resistance  - 1-2 x daily - 7 x weekly - 1-2 sets - 10 reps - 3 sec hold - Standing Shoulder Row with Anchored Resistance  - 1-2 x daily - 7 x weekly - 1-2 sets - 10 reps - 3 sec hold - Shoulder extension with resistance - Neutral  - 1-2 x daily - 7 x weekly - 1-2 sets - 10 reps  ASSESSMENT:  CLINICAL IMPRESSION:  Session focused on progressing exercises around scapula retraction mechanics to improve posture and posterior shoulder girdle stabilization. Good tolerance with progression, providing tactile cues as needed for scapula stabilization and decreasing upper trap compensation. Intermittent pain at anterior shoulder decreased with pec stretch variations; posterior shoulder pain decreased with stabilization of scapula and postural cues.   EVAL: Patient is a 73 y.o. gentleman who was seen today for physical therapy evaluation and treatment for L shoulder s/p debridement, SAD, DCE 01/04/24. No red flags or signs of post op complications today, did encourage monitoring of his incision as he states he removed anterior steri strip yesterday. Endorses difficulty with household tasks and limiting heavier tasks as expected post op. On exam he demonstrates limitations in passive mobility secondary to transient pain. Tolerates exam/HEP well overall without adverse event, HEP and education as above. Recommend trial of skilled PT to address aforementioned deficits with  aim of improving functional tolerance and reducing pain with typical activities. Pt departs today's session in no acute distress, all voiced concerns/questions addressed appropriately from PT perspective.      OBJECTIVE IMPAIRMENTS: decreased activity tolerance, decreased endurance, decreased mobility, decreased ROM, decreased strength, impaired perceived functional ability, impaired UE functional use, improper body mechanics, postural dysfunction, and pain.   ACTIVITY LIMITATIONS: carrying, lifting, dressing, reach over head, and hygiene/grooming  PARTICIPATION LIMITATIONS: meal prep, cleaning, laundry, driving, shopping, and community activity  PERSONAL FACTORS: Age, Time since onset of injury/illness/exacerbation, and 3+ comorbidities: hx chest pain, cervical fusion, lumbarfusion, HTN, hypothyroidism, LLE ulcer, R shoulder scope 2021, DM2, essential tremor, diabetic retinopathy are also affecting patient's functional outcome.   REHAB POTENTIAL: Good  CLINICAL DECISION MAKING: Stable/uncomplicated  EVALUATION COMPLEXITY: Low   GOALS:  SHORT TERM GOALS: Target date: 02/14/2024  Pt will demonstrate appropriate understanding and performance of initially prescribed HEP in order to facilitate improved independence with management of symptoms.  Baseline: HEP established  02/14/24: reports good HEP adherence  Goal status: MET  2. Pt will report at least 25% improvement in overall pain levels over past week in order to facilitate improved tolerance to typical daily activities.   Baseline: 5-11.910 02/21/24: 0-8/10 Goal status: ONGOING   LONG TERM GOALS: Target date: 03/13/2024   Pt will score less than or equal to 50% on Quick DASH in order to indicate reduced levels of disability due to shoulder pain (MDC 16-20pts).  Baseline: 70.45%   02/21/24: 47.7%  Goal status: MET  2.  Pt  will demonstrate at least 130 degrees of active L shoulder elevation in order to demonstrate improved tolerance  to functional movement patterns such as reaching overhead.  Baseline: see ROM chart above 02/21/24: see ROM chart above (flexion >130 deg, abduction <100) Goal status: PARTIALLY MET   3.  Pt will demonstrate at least 4+/5 shoulder flex/abd MMT for improved symmetry of UE strength and improved tolerance to functional movements.  Baseline: deferred on eval given acuity of surgery 02/21/24: deferred given proximity to surgery Goal status: ONGOING  4. Pt will report at least 50% decrease in overall pain levels in past week in order to facilitate improved tolerance to basic ADLs/mobility.   Baseline: 5-11.9/10 02/21/24: 0-8/10 Goal status: ONGOING  5. Pt will endorse ability to mow lawn with less than 3 pt increase in pain in order to facilitate improved tolerance to usual tasks.  Baseline: unable to perform  02/21/24: not mowing lawn yet  Goal status: ONGOING  6. Pt will endorse ability to perform usual self-care ADLs such as dressing/hygiene in order to facilitate improved return to PLOF.  Baseline: difficulty w/ upper body dressing, putting on deodorant  02/21/24: still having inc effort with deodorant, difficulty removing pullover shirts  Goal status: PROGRESSING  PLAN:  PT FREQUENCY: 2x/week  PT DURATION: 8 weeks  PLANNED INTERVENTIONS: 97164- PT Re-evaluation, 97750- Physical Performance Testing, 97110-Therapeutic exercises, 97530- Therapeutic activity, [id number]- Neuromuscular re-education, 97535- Self Care, [id number]- Manual therapy, 97016- Vasopneumatic device, 79439 (1-2 muscles), 20561 (3+ muscles)- Dry Needling, Patient/Family education, Taping, Joint mobilization, Spinal mobilization, Scar mobilization, Cryotherapy, and Moist heat  PLAN FOR NEXT SESSION: Re-eval next visit.    Lamarr Price, PTA 03/08/2024 11:47 AM

## 2024-03-13 ENCOUNTER — Ambulatory Visit: Admitting: Physical Therapy

## 2024-03-13 ENCOUNTER — Encounter: Payer: Self-pay | Admitting: Physical Therapy

## 2024-03-13 DIAGNOSIS — R600 Localized edema: Secondary | ICD-10-CM | POA: Diagnosis not present

## 2024-03-13 DIAGNOSIS — M25512 Pain in left shoulder: Secondary | ICD-10-CM

## 2024-03-13 DIAGNOSIS — M6281 Muscle weakness (generalized): Secondary | ICD-10-CM | POA: Diagnosis not present

## 2024-03-13 NOTE — Therapy (Signed)
 OUTPATIENT PHYSICAL THERAPY SHOULDER PROGRESS NOTE + RECERTIFICATION   Patient Name: Gregory GRAVELINE [name] MRN: [id number] DOB:[birth year], 73 y.o., male Today's Date: 03/13/2024  Progress Note Reporting Period 02/21/24 to 03/13/24  See note below for Objective Data and Assessment of Progress/Goals.   END OF SESSION:  PT End of Session - 03/13/24 1018     Visit Number 15    Number of Visits 27    Date for Recertification  04/24/24    Authorization Type [facility]    Authorization - Visit Number 15    Authorization - Number of Visits 15    Progress Note Due on Visit 25    PT Start Time 1018    PT Stop Time 1058    PT Time Calculation (min) 40 min          Past Medical History:  Diagnosis Date   Allergy not sure   Arthritis    Chest pain    a. Normal cors 2001. b. Neg stress test 2012, 05/2014.   Diabetes mellitus without complication (HCC)    GERD (gastroesophageal reflux disease)    Hyperlipidemia    Hypertension    Hypertensive response to exercise    Hypothyroidism    Obesity    Obstructive sleep apnea    Currently untreated    PONV (postoperative nausea and vomiting)    Sleep apnea    +cpap   Ulcer of left lower extremity (HCC) 12/16/2022   Past Surgical History:  Procedure Laterality Date   CARDIAC CATHETERIZATION     CERVICAL FUSION     HERNIA REPAIR     KNEE ARTHROSCOPY  1984   both knee   [facility]   POSTERIOR LUMBAR FUSION 2 WITH HARDWARE REMOVAL Left 01/04/2024   Procedure: ARTHROSCOPY, SHOULDER WITH DEBRIDEMENT;  Surgeon: Gregory Kay HERO, MD;  Location: [facility];  Service: Orthopedics;  Laterality: Left;  LEFT SHOULDER ARTHROSCOPY, EXTENSIVE DEBRIDEMENT, DISTAL CLAVICLE EXCISION, SUBACROMIAL DECOMPRESSION   SHOULDER ARTHROSCOPY WITH SUBACROMIAL DECOMPRESSION Right 09/13/2019   Procedure: RIGHT SHOULDER ARTHROSCOPY WITH EXTENSIVE DEBRIDEMENT, SUBACROMIAL DECOMPRESSION,;  Surgeon: Vernetta Lonni GRADE, MD;   Location: [facility];  Service: Orthopedics;  Laterality: Right;   SPINE SURGERY  1st Sept 1977   2nd Feb 2011   Patient Active Problem List   Diagnosis Date Noted   Arthritis of left acromioclavicular joint 01/04/2024   Diabetic retinopathy (HCC) 12/29/2023   On long term drug therapy 10/27/2023   Pure hypercholesterolemia 10/27/2023   Viral URI 06/28/2023   Splinter in skin 03/08/2023   COVID 02/02/2023   Bee sting reaction 01/11/2023   Ulcer of left lower extremity (HCC) 12/16/2022   Bilateral hand numbness 10/06/2022   Atypical pigmented lesion 09/29/2022   ED (erectile dysfunction) 09/29/2022   Fusion of spine of cervical region 09/14/2022   Bilateral third flexor stenosing tenosynovitis and first carpometacarpal osteoarthritis. 10/30/2021   Exercise counseling 07/29/2021   Other specified counseling 07/29/2021   Primary open-angle glaucoma, bilateral, mild stage 07/29/2021   Diabetic neuropathy (HCC) 07/31/2020   Abdominal aortic aneurysm 07/31/2020   Insomnia 07/31/2020   GERD (gastroesophageal reflux disease) 01/09/2020   Impingement syndrome of left shoulder 08/02/2019   Interstitial pulmonary disease (HCC) 06/29/2018   Hypertension associated with diabetes (HCC) 07/13/2017   Benign essential tremor 07/13/2017   Kidney cyst, acquired 01/24/2017   Seborrheic keratoses 04/01/2016   Diverticulosis of colon without hemorrhage 02/26/2016   Anxiety state 10/02/2015   BPH (  benign prostatic hyperplasia) 07/31/2015   Carpal tunnel syndrome, left upper limb 06/04/2015   Hypogonadotropic hypogonadism 03/06/2015   Vitamin D  deficiency 03/06/2015   History of pancreatitis 03/06/2015   Lumbar spondylosis 03/04/2015   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   Obstructive sleep apnea 05/15/2014   Hyperlipidemia 05/15/2014   Glaucoma suspect 10/21/2011    PCP: Gregory Bring, DO  REFERRING PROVIDER: Jerri Kay HERO,  MD  REFERRING DIAG: [phone] (ICD-10-CM) - Chronic left shoulder pain M75.42 (ICD-10-CM) - Impingement syndrome of left shoulder  THERAPY DIAG:  Left shoulder pain, unspecified chronicity  Muscle weakness (generalized)  Localized edema  Rationale for Evaluation and Treatment: Rehabilitation  ONSET DATE: scope + DCE/SAD 01/04/24 L shoulder  SUBJECTIVE:                                                                                                                                                                                      SUBJECTIVE STATEMENT: 03/13/2024: felt a bit of extra pain Friday and Saturday, improved symptoms Sunday. 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 Still more pain in the mornings. Still having pain/limitations with reaching out to side, reaching behind back, upper body dressing. Feels like he could use more PT to work on strength.    EVAL: Pt endorses chronic history with shoulder injuries, very active in sports, athletics, and [employer]. Worsening over past year prior to surgery. States since surgery pain has been steadily improving, is having difficulty w/ self care tasks, household tasks. Not performing heavier tasks around yard or home. Reports a bit of swelling in shoulder as expected, and bruising that is improving. States surgical follow up went well, reports being advised sling use PRN for comfort and no lifting 5# or greater. States yesterday he did remove one of his steri strips and it had some mild bleeding initially but no issues since. Denies constitutional symptoms (fevers, chills, SOB, etc). Has a bit of tingling in L lateral palm which he states is chronic, unchanged since surgery. Hand dominance: Right  PERTINENT HISTORY: DM, hx chest pain, cervical fusion, lumbar fusion (pt denies), HTN, hypothyroidism, LLE ulcer, R shoulder scope 2021, DM2, essential tremor, diabetic retinopathy Self reports aortic aneurysm, describes  as stable and follows with cardiology  PAIN:  Are you having pain: 7/10 at present, 6/10 in past week (updated 03/13/24)    Per eval:  Location/description: pinching in front of shoulder, posterior shoulder Best-worst over past week: 5-11.9/10  - aggravating factors: difficulty positioning while sitting, use of arm, picking up pots/pans, opening jar - Easing factors: sling use, icing, posiitoning    PRECAUTIONS: s/p  shoulder scope, SAD, DCE (no formal restrictions on referral).   RED FLAGS: No fevers/chills, no SOB or other constitutional symptoms  WEIGHT BEARING RESTRICTIONS: No  FALLS:  Has patient fallen in last 6 months? 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  DIAGNOSTIC FINDINGS:  S/p L shoulder debridement, SAD, DCE 01/04/24  PATIENT SURVEYS:  QuickDASH: 70.45%   02/21/24 quickDASH: 47.7%   COGNITION: Overall cognitive status: Within functional limits for tasks assessed     SENSATION: Numbness L hand, chronic and stable, unchanging since surgery per pt report  EDEMA/INSPECTION:  Incisions well appearing without drainage or erythema, steri strips over posterior and lateral incisions, anterior does not have steri strip but no apparent opening of incision ; gross edema apparent about L GH joint WNL; mild bruising upper arm which pt states is improving since surgery  UPPER EXTREMITY ROM:  A/PROM Right eval Left eval Left 01/24/24 Left 01/31/24 L 02/07/24 L 02/09/24 L 02/16/24 L 02/21/24 L 02/28/24 L 03/06/24 L 03/13/24    Shoulder  flexion A: 128 deg 91 deg passively A: 99 deg, mild pain on way down A: 120 deg  A: 125 deg AA: 134 deg  A: 135 deg *  A: 138 deg *   A: 131 deg*  AA: 141 deg  Shoulder abduction       A: 98 deg pain eccentrically A: 99 deg *    A: 110 deg *  Shoulder internal rotation         Functional combo; upper glute Functional combo: belt line Functional combo: iliac crest  Shoulder external rotation         Functional combo; occiput Functional combo: C4 Functional combo: C4  Elbow flexion             Elbow extension             Wrist flexion             Wrist extension              (Blank rows = not tested) (Key: WFL = within functional limits not formally assessed, * = concordant pain, s = stiffness/stretching sensation, NT = not tested)  Comments:    UPPER EXTREMITY MMT:  MMT Right eval Left eval 01/31/24 R / L 03/13/24 R/L   Shoulder flexion    5/4-  Shoulder extension    5/4- *  Shoulder abduction      Shoulder extension      Shoulder internal rotation      Shoulder external rotation      Elbow flexion      Elbow extension      Grip strength 55# 65# 55# / 80# 70# / 60#  (Blank rows = not tested)  (Key: WFL = within functional limits not formally assessed, * = concordant pain, s = stiffness/stretching sensation, NT = not tested)  Comments: MMT deferred given acuity of surgery   PALPATION:  TTP L UT, LS, rhomboid  [facility] Adult PT Treatment:                                                DATE: 03/13/24 Therapeutic Exercise: Swiss ball flexion up wall x12  Swiss ball scaption up wall x10 cues for appropriate positioning/ROM  Seated double ER UB isometric hold (10sec) 2x5 Sidelying L GH horizontal abd/add BW x10; 1# x8 Supine shoulder flexion AROM x10 HEP update + education/discussion  Therapeutic Activity: MSK assessment +  education/discussion Education/discussion re: progress with PT, symptom behavior as it affects activity tolerance, PT goals/POC  Education re: load management and activity modification based on symptom response    [facility] Adult PT Treatment:                                                DATE: 03/08/2024 Manual Therapy: STM (Lt) upper trap, levator scap, cervical paraspinals Neuromuscular re-ed: Side Lying: Shoulder ER x6 --> added 1#DB x6 --> added 3 sec hold x10 Shoulder abd + tactile cues for scapulohumeral rhythm Seated:  Scapula retraction + 10 sec hold Shoulder ER with scapula retraction + 5 sec hold --> added yellow TB + 3 sec hold Shoulder extension press down to neutral + red TB at mid level Rows to neutral + green TB with 3 sec hold Therapeutic Activity: Standing shoulder flexion & scaption, horizontal abd/add slides in standing with red PB 2x10 each Doorway pec stretch --> low 2x30 sec, 60 degrees 2 x 30 sec Pin & stretch upper trap (Lt)    [facility] Adult PT Treatment:                                                DATE: 03/06/24 Therapeutic Exercise: Swiss ball flexion up wall 3x8 cues for comfortable ROM  Sidelying ER 1# 2x8 cues for comfortable ROM Green band row x8 cues for posture HEP discussion/education  Neuromuscular re-ed: Sidelying horizontal abduction 2x10 second round cues for inc volitional UE muscle contraction (improved tolerance noted) Sidelying shoulder flexion LUE x8 cues for form and control RB shoulder extension x8 cues for lat activation and posture    PATIENT EDUCATION: Education details: Updated HEP Person educated: Patient Education method: Explanation, Demonstration, Tactile cues, Verbal cues Education comprehension: verbalized understanding, returned demonstration, verbal cues required, tactile cues required, and needs further education      HOME EXERCISE PROGRAM: Access Code: [email or web address] URL: [email or web address] Date:  03/13/2024 Prepared by: Alm Jenny  Exercises - Standing Isometric Shoulder Internal Rotation at Doorway  - 3-4 x weekly - 2-3 sets - 6-8 reps - Standing Isometric Shoulder External Rotation with Doorway  - 3-4 x weekly - 2-3 sets - 6-8 reps - Shoulder External Rotation and Scapular Retraction with Resistance  - 1-2 x daily - 7 x weekly - 1-2 sets - 10 reps - 3 sec hold - Standing Shoulder Row with Anchored Resistance  - 1-2 x daily - 7 x weekly - 1-2 sets - 10 reps - 3 sec hold - Shoulder extension with resistance - Neutral  - 1-2 x daily - 7 x weekly - 1-2  sets - 10 reps - Sidelying Shoulder Horizontal Abduction  - 1-2 x daily - 7 x weekly - 1 sets - 8-10 reps - Supine Shoulder Flexion Extension Full Range AROM  - 1-2 x daily - 7 x weekly - 1 sets - 8-10 reps  ASSESSMENT:  CLINICAL IMPRESSION:  03/13/2024: Pt arrives w/ report of continued slow/steady progress, continuing to describe difficulty with activities involving reaching and lifting. States he has tried higher level yardwork and household tasks with some pain/limitation. On exam he demonstrates steady overall progress with reaching, mild regression in flexion AROM which he attributes to increased pain on arrival. Tolerates exam and intervention well overall, some transient increases in pain that improve w/ isometric work. HEP update as above. Recommend extension of POC as noted below in order to facilitate improved tolerance to functional tasks and ADLs. Pt departs today's session in no acute distress, all voiced questions/concerns addressed appropriately from PT perspective.     EVAL: Patient is a 73 y.o. gentleman who was seen today for physical therapy evaluation and treatment for L shoulder s/p debridement, SAD, DCE 01/04/24. No red flags or signs of post op complications today, did encourage monitoring of his incision as he states he removed anterior steri strip yesterday. Endorses difficulty with household tasks and limiting  heavier tasks as expected post op. On exam he demonstrates limitations in passive mobility secondary to transient pain. Tolerates exam/HEP well overall without adverse event, HEP and education as above. Recommend trial of skilled PT to address aforementioned deficits with aim of improving functional tolerance and reducing pain with typical activities. Pt departs today's session in no acute distress, all voiced concerns/questions addressed appropriately from PT perspective.      OBJECTIVE IMPAIRMENTS: decreased activity tolerance, decreased endurance, decreased mobility, decreased ROM, decreased strength, impaired perceived functional ability, impaired UE functional use, improper body mechanics, postural dysfunction, and pain.   ACTIVITY LIMITATIONS: carrying, lifting, dressing, reach over head, and hygiene/grooming  PARTICIPATION LIMITATIONS: meal prep, cleaning, laundry, driving, shopping, and community activity  PERSONAL FACTORS: Age, Time since onset of injury/illness/exacerbation, and 3+ comorbidities: hx chest pain, cervical fusion, lumbarfusion, HTN, hypothyroidism, LLE ulcer, R shoulder scope 2021, DM2, essential tremor, diabetic retinopathy are also affecting patient's functional outcome.   REHAB POTENTIAL: Good  CLINICAL DECISION MAKING: Stable/uncomplicated  EVALUATION COMPLEXITY: Low   GOALS:  SHORT TERM GOALS: Target date: 02/14/2024  Pt will demonstrate appropriate understanding and performance of initially prescribed HEP in order to facilitate improved independence with management of symptoms.  Baseline: HEP established  02/14/24: reports good HEP adherence  Goal status: MET  2. Pt will report at least 25% improvement in overall pain levels over past week in order to facilitate improved tolerance to typical daily activities.   Baseline: 5-11.910 02/21/24: 0-8/10 03/13/24: 4-6/10 Goal status: ONGOING   LONG TERM GOALS: Target date: 04/24/2024 (updated 03/13/24)   Pt will  score less than or equal to 50% on Quick DASH in order to indicate reduced levels of disability due to shoulder pain (MDC 16-20pts).  Baseline: 70.45%   02/21/24: 47.7%  Goal status: MET  2.  Pt will demonstrate at least 130 degrees of active L shoulder elevation in order to demonstrate improved tolerance to functional movement patterns such as reaching overhead.  Baseline: see ROM chart above 02/21/24: see ROM chart above (flexion >130 deg, abduction <100) 03/13/24: see ROM chart above Goal status: PROGRESSING   3.  Pt will demonstrate at least 4+/5 shoulder flex/abd MMT for improved symmetry  of UE strength and improved tolerance to functional movements.  Baseline: deferred on eval given acuity of surgery 02/21/24: deferred given proximity to surgery 03/13/24: see MMT chart above Goal status: PROGRESSING  4. Pt will report at least 50% decrease in overall pain levels in past week in order to facilitate improved tolerance to basic ADLs/mobility.   Baseline: 5-11.9/10 02/21/24: 0-8/10 03/13/24: 4-6/10 Goal status: ONGOING  5. Pt will endorse ability to mow lawn with less than 3 pt increase in pain in order to facilitate improved tolerance to usual tasks.  Baseline: unable to perform  02/21/24: not mowing lawn yet  03/13/24: was able to mow the lawn but did increase pain  Goal status: PARTIALLY MET  6. Pt will endorse ability to perform usual self-care ADLs such as dressing/hygiene in order to facilitate improved return to PLOF.  Baseline: difficulty w/ upper body dressing, putting on deodorant  02/21/24: still having inc effort with deodorant, difficulty removing pullover shirts  03/13/24: improved tolerance w/ upper body dressing, difficulty w/ deodorant  Goal status: PROGRESSING  PLAN: (updated 03/13/24)  PT FREQUENCY: 1-2x/week  PT DURATION: 6 weeks  PLANNED INTERVENTIONS: 97164- PT Re-evaluation, 97750- Physical Performance Testing, 97110-Therapeutic exercises, 97530-  Therapeutic activity, 97112- Neuromuscular re-education, 97535- Self Care, 02859- Manual therapy, 97016- Vasopneumatic device, 79439 (1-2 muscles), 20561 (3+ muscles)- Dry Needling, Patient/Family education, Taping, Joint mobilization, Spinal mobilization, Scar mobilization, Cryotherapy, and Moist heat  PLAN FOR NEXT SESSION: continue working on active shoulder mobility, light GH strengthening (non-consecutive days) and periscapular strength/stability   Alm DELENA Jenny PT, DPT 03/13/2024 12:59 PM

## 2024-03-15 ENCOUNTER — Ambulatory Visit

## 2024-03-21 ENCOUNTER — Ambulatory Visit (INDEPENDENT_AMBULATORY_CARE_PROVIDER_SITE_OTHER): Admitting: Physician Assistant

## 2024-03-21 DIAGNOSIS — Z9889 Other specified postprocedural states: Secondary | ICD-10-CM

## 2024-03-21 DIAGNOSIS — M65342 Trigger finger, left ring finger: Secondary | ICD-10-CM | POA: Diagnosis not present

## 2024-03-21 NOTE — Progress Notes (Signed)
 Office Visit Note   Patient: Gregory SCHUELLER [name]           Date of Birth: 1951/01/19           MRN: [id number] Visit Date: 03/21/2024              Requested by: Alvia Bring, DO [facility] [address] [place],  [place] [address] PCP: Alvia Bring, DO   Assessment & Plan: Visit Diagnoses:  1. History of arthroscopy of left shoulder   2. Trigger finger, left ring finger     Plan: Impression is status post left shoulder arthroscopic debridement decompression.  Patient is progressing in PT although he is still limited with internal rotation.  Have offered him referral to Dr. Burnetta for glenohumeral cortisone injection which she has politely declined.  Would like for him to continue with his PT.  In regards to the left ring trigger finger, have offered him cortisone injection today as well as night splinting.  He would like to hold off for now.  Follow-up in 2 weeks with Dr. Jerri for left shoulder.  Follow-Up Instructions: Return in about 3 months (around 06/21/2024) for with Jerri.   Orders:  No orders of the defined types were placed in this encounter.  No orders of the defined types were placed in this encounter.     Procedures: No procedures performed   Clinical Data: No additional findings.   Subjective: Chief Complaint  Patient presents with   Left Shoulder - Follow-up    Left shoulder scope 01/04/24    HPI patient is a pleasant 73 year old gentleman who comes in today approximately 12 weeks status post left shoulder arthroscopic debridement decompression.  He has been doing relatively well in regards to the left shoulder.  He has been in physical therapy making the progress.  Another issue he brings up today is pain and triggering to the left ring finger.  Symptoms began over a year ago.  No previous cortisone injection.  Of note he is diabetic.  Review of Systems as detailed in HPI.  All others reviewed and are negative.   Objective: Vital Signs:  There were no vitals taken for this visit.  Physical Exam well-developed well-nourished gentleman in no acute distress.  Alert and oriented x 3.  Ortho Exam left shoulder exam: Forward flexion to approximately 170 degrees.  Abduction to about 90 degrees.  Internal rotation to his back pocket.  4 out of 5 strength throughout.  He is neurovascular intact distally.  Left hand exam: He does have pain a palpable nodule at the A1 pulley of the ring finger.  He has reproducible triggering.  He is neurovascularly intact distally.  Specialty Comments:  MRI CERVICAL SPINE WITHOUT CONTRAST   TECHNIQUE: Multiplanar, multisequence MR imaging of the cervical spine was performed. No intravenous contrast was administered.   COMPARISON:  Cervical spine radiographs 09/14/2022, cervical spine MRI 06/28/2009 (report only, images not available.   FINDINGS: Alignment: There is a reversal of the normal cervical lordosis. There is no antero or retrolisthesis.   Vertebrae: Vertebral body heights are preserved, without evidence of acute injury. Background marrow signal is normal. There is no suspicious marrow signal abnormality or marrow edema. Postsurgical changes reflecting ACDF from C3 through C5 as well as prior fusion at C5 through C7 are noted.   Cord: There are small foci of signal abnormality in the right and left aspect of the cord at C3-C4 likely reflecting myelomalacia. There is additional  signal abnormality in the central cord at T2 on the axial images (5-38). This is incompletely imaged on the axial images but appears to extend to the T3 level on the sagittal STIR sequence (4-8).   Posterior Fossa, vertebral arteries, paraspinal tissues: The imaged posterior fossa is unremarkable. The vertebral artery flow voids are normal. The paraspinal soft tissues are unremarkable.   Disc levels:   C2-C3: There is mild facet arthropathy with ligamentum flavum thickening but no significant spinal canal  or neural foraminal stenosis   C3-C4: Status post ACDF without residual spinal canal or neural foraminal stenosis   C4-C5: Status post ACDF with suspected mild right neural foraminal stenosis due to endplate spurring and facet arthropathy. No significant spinal canal or left neural foraminal stenosis   C5-C6: Prior fusion without residual spinal canal or neural foraminal stenosis   C6-C7: Prior fusion without residual spinal canal or neural foraminal stenosis.   C7-T1: There is a disc bulge, endplate spurring, and bilateral facet arthropathy with ligamentum flavum thickening resulting in moderate spinal canal stenosis and severe bilateral neural foraminal stenosis   T1-T2: There is a broad-based disc protrusion and bilateral facet arthropathy resulting in mild spinal canal stenosis and mild bilateral neural foraminal stenosis.   IMPRESSION: 1. Status post ACDF at C3 through C5 with additional prior fusion at C5 through C7. No significant residual spinal canal or neural foraminal stenosis at the fused levels. 2. 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 3. Mild spinal canal stenosis and mild bilateral neural foraminal stenosis at T1-T2 due to a disc protrusion and bilateral facet arthropathy. 4. Small foci of cord signal abnormality at C3-C4 consistent with myelomalacia. 5. Additional central cord signal abnormality at T2 is incompletely evaluated but could reflect a small syrinx. Edema or myelomalacia are considered less likely given lack of cord compression. This could be further evaluated with dedicated thoracic spine MRI if indicated.     Electronically Signed   By: Maude Harry M.D.   On: 10/05/2022 08:36  Imaging: No results found.   PMFS History: Patient Active Problem List   Diagnosis Date Noted   Arthritis of left acromioclavicular joint 01/04/2024   Diabetic retinopathy  (HCC) 12/29/2023   On long term drug therapy 10/27/2023   Pure hypercholesterolemia 10/27/2023   Viral URI 06/28/2023   Splinter in skin 03/08/2023   COVID 02/02/2023   Bee sting reaction 01/11/2023   Ulcer of left lower extremity (HCC) 12/16/2022   Bilateral hand numbness 10/06/2022   Atypical pigmented lesion 09/29/2022   ED (erectile dysfunction) 09/29/2022   Fusion of spine of cervical region 09/14/2022   Bilateral third flexor stenosing tenosynovitis and first carpometacarpal osteoarthritis. 10/30/2021   Exercise counseling 07/29/2021   Other specified counseling 07/29/2021   Primary open-angle glaucoma, bilateral, mild stage 07/29/2021   Diabetic neuropathy (HCC) 07/31/2020   Abdominal aortic aneurysm 07/31/2020   Insomnia 07/31/2020   GERD (gastroesophageal reflux disease) 01/09/2020   Impingement syndrome of left shoulder 08/02/2019   Interstitial pulmonary disease (HCC) 06/29/2018   Hypertension associated with diabetes (HCC) 07/13/2017   Benign essential tremor 07/13/2017   Kidney cyst, acquired 01/24/2017   Seborrheic keratoses 04/01/2016   Diverticulosis of colon without hemorrhage 02/26/2016   Anxiety state 10/02/2015   BPH (benign prostatic hyperplasia) 07/31/2015   Carpal tunnel syndrome, left upper limb 06/04/2015   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 Normal cors 2001. b. Neg stress test 2012, 05/2014.   Diabetes mellitus without complication (HCC)    GERD (gastroesophageal reflux disease)    Hyperlipidemia    Hypertension    Hypertensive response to exercise    Hypothyroidism    Obesity     Obstructive sleep apnea    Currently untreated    PONV (postoperative nausea and vomiting)    Sleep apnea    +cpap   Ulcer of left lower extremity (HCC) 12/16/2022    Family History  Problem Relation Age of Onset   Diabetes Mother    Heart disease Mother    Hypertension Mother    Stroke Mother    Heart attack Mother    COPD Mother    Cancer Father    Heart disease Father    Diabetes Father    Hyperlipidemia Sister    Hypertension Sister    Diabetes Maternal Grandmother    Stroke Maternal Grandmother    Heart attack Maternal Grandmother    Heart disease Maternal Grandmother    Diabetes Maternal Grandfather    Hypertension Maternal Grandfather    Heart attack Maternal Grandfather    Heart disease Maternal Grandfather    Diabetes Sister    Hypertension Sister    Cancer Sister    Hyperlipidemia Sister    Heart attack Paternal Grandmother    Heart attack Paternal Grandfather    Cancer Sister    Hyperlipidemia Sister    COPD Maternal Aunt    Hypertension Maternal Aunt    Diabetes Son    Diabetes Maternal Aunt     Past Surgical History:  Procedure Laterality Date   CARDIAC CATHETERIZATION     CERVICAL FUSION     HERNIA REPAIR     KNEE ARTHROSCOPY  1984   both knee   [facility]   POSTERIOR LUMBAR FUSION 2 WITH HARDWARE REMOVAL Left 01/04/2024   Procedure: ARTHROSCOPY, SHOULDER WITH DEBRIDEMENT;  Surgeon: Jerri Kay HERO, MD;  Location: [facility];  Service: Orthopedics;  Laterality: Left;  LEFT SHOULDER ARTHROSCOPY, EXTENSIVE DEBRIDEMENT, DISTAL CLAVICLE EXCISION, SUBACROMIAL DECOMPRESSION   SHOULDER ARTHROSCOPY WITH SUBACROMIAL DECOMPRESSION Right 09/13/2019   Procedure: RIGHT SHOULDER ARTHROSCOPY WITH EXTENSIVE DEBRIDEMENT, SUBACROMIAL DECOMPRESSION,;  Surgeon: Vernetta Lonni GRADE, MD;  Location: [facility];  Service: Orthopedics;  Laterality: Right;   SPINE SURGERY  1st Sept 1977   2nd Feb 2011   Social  History   Occupational History    Comment: Retired  Tobacco Use   Smoking status: Former    Current packs/day: 0.00    Average packs/day: 1.5 packs/day for 30.0 years (45.0 ttl pk-yrs)    Types: Cigarettes    Start date: 06/01/1963    Quit date: 05/31/1993    Years since quitting: 30.8   Smokeless tobacco: Never   Tobacco comments:    24 years smoke free and alcohol free  Vaping Use   Vaping status: Never Used  Substance and Sexual Activity   Alcohol use: No   Drug use: No   Sexual activity: Yes    Birth control/protection: None    Comment: Not Necessary

## 2024-03-22 ENCOUNTER — Encounter: Payer: Self-pay | Admitting: Orthopaedic Surgery

## 2024-03-26 ENCOUNTER — Telehealth: Payer: Self-pay

## 2024-04-02 ENCOUNTER — Encounter: Payer: Self-pay | Admitting: Radiology

## 2024-04-04 ENCOUNTER — Ambulatory Visit: Payer: Self-pay

## 2024-04-04 NOTE — Telephone Encounter (Signed)
 FYI Only or Action Required?: FYI only for provider: appointment scheduled on 04/05/24.  Patient was last seen in primary care on 11/17/2023 by Alvia Bring, DO.  Called Nurse Triage reporting Back Pain.  Symptoms began chronic, worsening X 3 days.  Interventions attempted: Prescription medications: Flexaril and Rest, hydration, or home remedies.  Symptoms are: gradually worsening.  Triage Disposition: See PCP When Office is Open (Within 3 Days)  Patient/caregiver understands and will follow disposition?: Yes     Copied from CRM [id number]. Topic: Clinical - Red Word Triage >> Apr 04, 2024  9:55 AM Dedra NOVAK wrote: [facility] that prompted transfer to Nurse Triage: Pt having severe lower back pain. No other symptoms. Warm transfer to NT. Reason for Disposition  [1] MODERATE back pain (e.g., interferes with normal activities) AND [2] present > 3 days  Answer Assessment - Initial Assessment Questions 1. ONSET: When did the pain begin? (e.g., minutes, hours, days)     Chronic-worse since Sunday  2. LOCATION: Where does it hurt? (upper, mid or lower back)     Across lower back  3. SEVERITY: How bad is the pain?  (e.g., Scale 1-10; mild, moderate, or severe)     9.5/10 4. PATTERN: Is the pain constant? (e.g., yes, no; constant, intermittent)      Constant  5. RADIATION: Does the pain shoot into your legs or somewhere else?     Denies  6. CAUSE:  What do you think is causing the back pain?      Unsure chronic  7. BACK OVERUSE:  Any recent lifting of heavy objects, strenuous work or exercise?     Worse with walking  8. MEDICINES: What have you taken so far for the pain? (e.g., nothing, acetaminophen , NSAIDS)     Flexaril yesterday  9. NEUROLOGIC SYMPTOMS: Do you have any weakness, numbness, or problems with bowel/bladder control?     denies 10. OTHER SYMPTOMS: Do you have any other symptoms? (e.g., fever, abdomen pain, burning with urination, blood in  urine)  Protocols used: Back Pain-A-AH

## 2024-04-05 ENCOUNTER — Encounter: Payer: Self-pay | Admitting: Family Medicine

## 2024-04-05 ENCOUNTER — Ambulatory Visit (INDEPENDENT_AMBULATORY_CARE_PROVIDER_SITE_OTHER): Admitting: Family Medicine

## 2024-04-05 ENCOUNTER — Ambulatory Visit

## 2024-04-05 VITALS — BP 143/84 | HR 79 | Ht 69.5 in | Wt 266.0 lb

## 2024-04-05 DIAGNOSIS — E039 Hypothyroidism, unspecified: Secondary | ICD-10-CM | POA: Insufficient documentation

## 2024-04-05 DIAGNOSIS — M545 Low back pain, unspecified: Secondary | ICD-10-CM | POA: Insufficient documentation

## 2024-04-05 DIAGNOSIS — M1990 Unspecified osteoarthritis, unspecified site: Secondary | ICD-10-CM | POA: Insufficient documentation

## 2024-04-05 DIAGNOSIS — I2510 Atherosclerotic heart disease of native coronary artery without angina pectoris: Secondary | ICD-10-CM | POA: Insufficient documentation

## 2024-04-05 MED ORDER — METHOCARBAMOL 500 MG PO TABS: 500.0000 mg | ORAL_TABLET | Freq: Three times a day (TID) | ORAL | 1 refills | Status: AC | PRN

## 2024-04-05 MED ORDER — METHYLPREDNISOLONE ACETATE 80 MG/ML IJ SUSP
80.0000 mg | Freq: Once | INTRAMUSCULAR | Status: AC
  Administered 2024-04-05: 80 mg via INTRAMUSCULAR

## 2024-04-05 MED ORDER — HYDROCODONE-ACETAMINOPHEN 5-325 MG PO TABS: 1.0000 | ORAL_TABLET | Freq: Three times a day (TID) | ORAL | 0 refills | Status: AC | PRN

## 2024-04-05 MED ORDER — NAPROXEN 500 MG PO TABS: 500.0000 mg | ORAL_TABLET | Freq: Two times a day (BID) | ORAL | 0 refills | Status: AC | PRN

## 2024-04-05 NOTE — Assessment & Plan Note (Signed)
 Given injection of Depo-Medrol  in clinic today.  Prescription for naproxen and methocarbamol .  Short-term Norco for severe pain.  Given handout for home exercises.  Red flags reviewed.  He will let me know if not improving.

## 2024-04-05 NOTE — Patient Instructions (Signed)
 Start naproxen twice per day for the next 5 days then as needed.  Use methocarbamol  every 8 hours as needed.  Start exercise in 2-3 days.

## 2024-04-05 NOTE — Progress Notes (Signed)
 Gregory DELENA Holt [name] - 73 y.o. male MRN [id number]  Date of birth: 30-Nov-1950  Subjective Chief Complaint  Patient presents with   Back Pain    HPI Gregory DELENA Holt [name] Is a 73 y.o. male here today with complaint of increasing back pain.  Pain is located in lower back on the R side.  Does not recall any injury or overuse.  Denies radiation into legs. He did use flexeril earlier this week and this did seem to help.  No bowel or bladder dysfunction.  Denies urinary symptoms.  He has not had fever or chills.  ROS:  A comprehensive ROS was completed and negative except as noted per HPI  Allergies  Allergen Reactions   Statins     Muscle cramping all over body. Lipitor, Crestor , and pravastatin.   Liraglutide Other (See Comments)    Caused pancreatitis Other reaction(s): pancreas infection   Lisinopril Cough    Past Medical History:  Diagnosis Date   Allergy not sure   Arteriosclerosis of coronary artery 04/05/2024   Arthritis    Chest pain    a. Normal cors 2001. b. Neg stress test 2012, 05/2014.   Diabetes mellitus without complication (HCC)    GERD (gastroesophageal reflux disease)    Hyperlipidemia    Hypertension    Hypertensive response to exercise    Hypothyroidism    Obesity    Obstructive sleep apnea    Currently untreated    PONV (postoperative nausea and vomiting)    Sleep apnea    +cpap   Ulcer of left lower extremity (HCC) 12/16/2022    Past Surgical History:  Procedure Laterality Date   CARDIAC CATHETERIZATION     CERVICAL FUSION     HERNIA REPAIR     KNEE ARTHROSCOPY  1984   both knee   [facility]   POSTERIOR LUMBAR FUSION 2 WITH HARDWARE REMOVAL Left 01/04/2024   Procedure: ARTHROSCOPY, SHOULDER WITH DEBRIDEMENT;  Surgeon: Jerri Kay HERO, MD;  Location: [facility];  Service: Orthopedics;  Laterality: Left;  LEFT SHOULDER ARTHROSCOPY, EXTENSIVE DEBRIDEMENT, DISTAL CLAVICLE EXCISION, SUBACROMIAL  DECOMPRESSION   SHOULDER ARTHROSCOPY WITH SUBACROMIAL DECOMPRESSION Right 09/13/2019   Procedure: RIGHT SHOULDER ARTHROSCOPY WITH EXTENSIVE DEBRIDEMENT, SUBACROMIAL DECOMPRESSION,;  Surgeon: Vernetta Lonni GRADE, MD;  Location: [facility];  Service: Orthopedics;  Laterality: Right;   SPINE SURGERY  1st Sept 1977   2nd Feb 2011    Social History   Socioeconomic History   Marital status: Married    Spouse name: Avelina   Number of children: 2   Years of education: 16   Highest education level: Bachelor's degree (e.g., BA, AB, BS)  Occupational History    Comment: Retired  Tobacco Use   Smoking status: Former    Current packs/day: 0.00    Average packs/day: 1.5 packs/day for 30.0 years (45.0 ttl pk-yrs)    Types: Cigarettes    Start date: 06/01/1963    Quit date: 05/31/1993    Years since quitting: 30.8   Smokeless tobacco: Never   Tobacco comments:    24 years smoke free and alcohol free  Vaping Use   Vaping status: Never Used  Substance and Sexual Activity   Alcohol use: No   Drug use: No   Sexual activity: Yes    Birth control/protection: None    Comment: Not Necessary  Other Topics Concern   Not on file  Social History Narrative   Lives with his  wife. He has two children and three grand children. He likes to read his bible and go fishing in his free time.    Social Drivers of Corporate Investment Banker Strain: Low Risk  (11/15/2023)   Overall Financial Resource Strain (CARDIA)    Difficulty of Paying Living Expenses: Not very hard  Recent Concern: Financial Resource Strain - Medium Risk (10/31/2023)   Received [facility]   Overall Financial Resource Strain (CARDIA)    Difficulty of Paying Living Expenses: Somewhat hard  Food Insecurity: No Food Insecurity (11/15/2023)   Hunger Vital Sign    Worried About Running Out of Food in the Last Year: Never true    Ran Out of Food in the Last Year: Never true  Transportation Needs: No Transportation  Needs (11/15/2023)   PRAPARE - Administrator, Civil Service (Medical): No    Lack of Transportation (Non-Medical): No  Physical Activity: Insufficiently Active (11/15/2023)   Exercise Vital Sign    Days of Exercise per Week: 3 days    Minutes of Exercise per Session: 40 min  Stress: No Stress Concern Present (11/15/2023)   [employer] of Occupational Health - Occupational Stress Questionnaire    Feeling of Stress: Only a little  Social Connections: Socially Integrated (11/15/2023)   Social Connection and Isolation Panel    Frequency of Communication with Friends and Family: Three times a week    Frequency of Social Gatherings with Friends and Family: Twice a week    Attends Religious Services: More than 4 times per year    Active Member of [employer] or Organizations: Yes    Attends Banker Meetings: Patient declined    Marital Status: Married    Family History  Problem Relation Age of Onset   Diabetes Mother    Heart disease Mother    Hypertension Mother    Stroke Mother    Heart attack Mother    COPD Mother    Cancer Father    Heart disease Father    Diabetes Father    Hyperlipidemia Sister    Hypertension Sister    Diabetes Maternal Grandmother    Stroke Maternal Grandmother    Heart attack Maternal Grandmother    Heart disease Maternal Grandmother    Diabetes Maternal Grandfather    Hypertension Maternal Grandfather    Heart attack Maternal Grandfather    Heart disease Maternal Grandfather    Diabetes Sister    Hypertension Sister    Cancer Sister    Hyperlipidemia Sister    Heart attack Paternal Grandmother    Heart attack Paternal Grandfather    Cancer Sister    Hyperlipidemia Sister    COPD Maternal Aunt    Hypertension Maternal Aunt    Diabetes Son    Diabetes Maternal Aunt     Health Maintenance  Topic Date Due   Diabetic kidney evaluation - Urine ACR  12/21/2023   OPHTHALMOLOGY EXAM  02/14/2024   Influenza Vaccine  08/28/2024  (Originally 12/30/2023)   COVID-19 Vaccine (6 - 2025-26 season) 04/21/2025 (Originally 01/30/2024)   HEMOGLOBIN A1C  05/18/2024   Medicare Annual Wellness (AWV)  09/20/2024   FOOT EXAM  11/16/2024   Diabetic kidney evaluation - eGFR measurement  12/28/2024   Colonoscopy  02/23/2026   DTaP/Tdap/Td (6 - Td or Tdap) 10/27/2032   Pneumococcal Vaccine: 50+ Years  Completed   Hepatitis C Screening  Completed   Zoster Vaccines- Shingrix  Completed   Meningococcal B Vaccine  Aged Out     ----------------------------------------------------------------------------------------------------------------------------------------------------------------------------------------------------------------- Physical Exam BP (!) 143/84 (BP Location: Right Arm, Patient Position: Sitting, Cuff Size: Large)   Pulse 79   Ht 5' 9.5 (1.765 m)   Wt 266 lb (120.7 kg)   SpO2 98%   BMI 38.72 kg/m   Physical Exam Constitutional:      Appearance: Normal appearance.  HENT:     Head: Normocephalic and atraumatic.  Eyes:     General: No scleral icterus. Cardiovascular:     Rate and Rhythm: Normal rate and regular rhythm.  Pulmonary:     Effort: Pulmonary effort is normal.     Breath sounds: Normal breath sounds.  Musculoskeletal:     Cervical back: Neck supple.  Neurological:     Mental Status: He is alert.  Psychiatric:        Mood and Affect: Mood normal.        Behavior: Behavior normal.     ------------------------------------------------------------------------------------------------------------------------------------------------------------------------------------------------------------------- Assessment and Plan  Acute right-sided low back pain without sciatica Given injection of Depo-Medrol  in clinic today.  Prescription for naproxen and methocarbamol .  Short-term Norco for severe pain.  Given handout for home exercises.  Red flags reviewed.  He will let me know if not improving.   Meds  ordered this encounter  Medications   naproxen (NAPROSYN) 500 MG tablet    Sig: Take 1 tablet (500 mg total) by mouth 2 (two) times daily as needed. Take with food    Dispense:  30 tablet    Refill:  0   HYDROcodone -acetaminophen  (NORCO/VICODIN) 5-325 MG tablet    Sig: Take 1 tablet by mouth 3 (three) times daily as needed for severe pain (pain score 7-10). To be taken after surgery    Dispense:  10 tablet    Refill:  0   methocarbamol  (ROBAXIN ) 500 MG tablet    Sig: Take 1 tablet (500 mg total) by mouth 3 (three) times daily as needed.    Dispense:  20 tablet    Refill:  1   methylPREDNISolone  acetate (DEPO-MEDROL ) injection 80 mg    No follow-ups on file.

## 2024-04-09 ENCOUNTER — Encounter: Payer: Self-pay | Admitting: Family Medicine

## 2024-04-09 DIAGNOSIS — M5136 Other intervertebral disc degeneration, lumbar region with discogenic back pain only: Secondary | ICD-10-CM

## 2024-04-17 NOTE — Therapy (Signed)
 OUTPATIENT PHYSICAL THERAPY SHOULDER PROGRESS NOTE + RECERTIFICATION ***    Patient Name: Gregory West [name] MRN: [id number] DOB:[birth year], 73 y.o., male Today's Date: 04/17/2024  END OF SESSION:    Past Medical History:  Diagnosis Date   Allergy not sure   Arteriosclerosis of coronary artery 04/05/2024   Arthritis    Chest pain    a. Normal cors 2001. b. Neg stress test 2012, 05/2014.   Diabetes mellitus without complication (HCC)    GERD (gastroesophageal reflux disease)    Hyperlipidemia    Hypertension    Hypertensive response to exercise    Hypothyroidism    Obesity    Obstructive sleep apnea    Currently untreated    PONV (postoperative nausea and vomiting)    Sleep apnea    +cpap   Ulcer of left lower extremity (HCC) 12/16/2022   Past Surgical History:  Procedure Laterality Date   CARDIAC CATHETERIZATION     CERVICAL FUSION     HERNIA REPAIR     KNEE ARTHROSCOPY  1984   both knee   [facility]   POSTERIOR LUMBAR FUSION 2 WITH HARDWARE REMOVAL Left 01/04/2024   Procedure: ARTHROSCOPY, SHOULDER WITH DEBRIDEMENT;  Surgeon: Jerri Kay HERO, MD;  Location: [facility];  Service: Orthopedics;  Laterality: Left;  LEFT SHOULDER ARTHROSCOPY, EXTENSIVE DEBRIDEMENT, DISTAL CLAVICLE EXCISION, SUBACROMIAL DECOMPRESSION   SHOULDER ARTHROSCOPY WITH SUBACROMIAL DECOMPRESSION Right 09/13/2019   Procedure: RIGHT SHOULDER ARTHROSCOPY WITH EXTENSIVE DEBRIDEMENT, SUBACROMIAL DECOMPRESSION,;  Surgeon: Vernetta Lonni GRADE, MD;  Location: [facility];  Service: Orthopedics;  Laterality: Right;   SPINE SURGERY  1st Sept 1977   2nd Feb 2011   Patient Active Problem List   Diagnosis Date Noted   Osteoarthritis 04/05/2024   Hypothyroidism 04/05/2024   Arteriosclerosis of coronary artery 04/05/2024   Acute right-sided low back pain without sciatica 04/05/2024   Arthritis of left acromioclavicular joint 01/04/2024   Diabetic  retinopathy (HCC) 12/29/2023   On long term drug therapy 10/27/2023   Pure hypercholesterolemia 10/27/2023   Viral URI 06/28/2023   Splinter in skin 03/08/2023   COVID 02/02/2023   Bee sting reaction 01/11/2023   Ulcer of left lower extremity (HCC) 12/16/2022   Bilateral hand numbness 10/06/2022   Atypical pigmented lesion 09/29/2022   ED (erectile dysfunction) 09/29/2022   Fusion of spine of cervical region 09/14/2022   Type 2 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 10/30/2021   Exercise counseling 07/29/2021   Other specified counseling 07/29/2021   Primary open-angle glaucoma, bilateral, mild stage 07/29/2021   Diabetic neuropathy (HCC) 07/31/2020   Abdominal aortic aneurysm 07/31/2020   Insomnia 07/31/2020   GERD (gastroesophageal reflux disease) 01/09/2020   Impingement syndrome of left shoulder 08/02/2019   Interstitial pulmonary disease (HCC) 06/29/2018   Hypertension associated with diabetes (HCC) 07/13/2017   Benign essential tremor 07/13/2017   Kidney cyst, acquired 01/24/2017   Seborrheic keratoses 04/01/2016   Diverticulosis of colon without hemorrhage 02/26/2016   Anxiety state 10/02/2015   BPH (benign prostatic hyperplasia) 07/31/2015   Carpal tunnel syndrome, left upper limb 06/04/2015   Hypogonadotropic hypogonadism 03/06/2015   Vitamin D  deficiency 03/06/2015   History of pancreatitis 03/06/2015   Lumbar spondylosis 03/04/2015   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   Obstructive sleep apnea 05/15/2014   Hyperlipidemia 05/15/2014   Glaucoma suspect 10/21/2011    PCP:  Alvia Bring, DO  REFERRING PROVIDER: Jerri Kay HERO, MD  REFERRING DIAG: [phone] (ICD-10-CM) - Chronic left shoulder pain M75.42 (ICD-10-CM) - Impingement syndrome of left shoulder  THERAPY DIAG:  No diagnosis found.  Rationale for  Evaluation and Treatment: Rehabilitation  ONSET DATE: scope + DCE/SAD 01/04/24 L shoulder  SUBJECTIVE:                                                                                                                                                                                      SUBJECTIVE STATEMENT: 04/17/2024: ***  *** felt a bit of extra pain Friday and Saturday, improved symptoms Sunday. 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 Still more pain in the mornings. Still having pain/limitations with reaching out to side, reaching behind back, upper body dressing. Feels like he could use more PT to work on strength.    EVAL: Pt endorses chronic history with shoulder injuries, very active in sports, athletics, and [employer]. Worsening over past year prior to surgery. States since surgery pain has been steadily improving, is having difficulty w/ self care tasks, household tasks. Not performing heavier tasks around yard or home. Reports a bit of swelling in shoulder as expected, and bruising that is improving. States surgical follow up went well, reports being advised sling use PRN for comfort and no lifting 5# or greater. States yesterday he did remove one of his steri strips and it had some mild bleeding initially but no issues since. Denies constitutional symptoms (fevers, chills, SOB, etc). Has a bit of tingling in L lateral palm which he states is chronic, unchanged since surgery. Hand dominance: Right  PERTINENT HISTORY: DM, hx chest pain, cervical fusion, lumbar fusion (pt denies), HTN, hypothyroidism, LLE ulcer, R shoulder scope 2021, DM2, essential tremor, diabetic retinopathy Self reports aortic aneurysm, describes as stable and follows with cardiology  PAIN:  Are you having pain: 7/10 at present, 6/10 in past week (updated 03/13/24)  ***  Per eval:  Location/description: pinching in front of shoulder, posterior shoulder Best-worst over past week: 5-11.9/10  -  aggravating factors: difficulty positioning while sitting, use of arm, picking up pots/pans, opening jar - Easing factors: sling use, icing, posiitoning    PRECAUTIONS: s/p shoulder scope, SAD, DCE (no formal restrictions on referral).   RED FLAGS: No fevers/chills, no SOB or other constitutional symptoms  WEIGHT BEARING RESTRICTIONS: No  FALLS:  Has patient fallen in last 6 months? 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  DIAGNOSTIC FINDINGS:  S/p L shoulder debridement, SAD, DCE 01/04/24  PATIENT SURVEYS:  QuickDASH: 70.45%   02/21/24 quickDASH: 47.7%   04/18/24 quickdash: ***   COGNITION: Overall cognitive status: Within functional limits for tasks assessed     SENSATION: Numbness L hand, chronic and stable, unchanging since surgery per pt report  EDEMA/INSPECTION:  Incisions well appearing without drainage or erythema, steri strips over posterior and lateral incisions, anterior does not have steri strip but no apparent opening of incision ; gross edema apparent about L GH joint WNL; mild bruising upper arm which pt states is improving since surgery  UPPER EXTREMITY ROM:  A/PROM Right eval Left eval Left 01/24/24 Left 01/31/24 L 02/07/24 L 02/09/24 L 02/16/24 L 02/21/24 L 02/28/24 L 03/06/24 L 03/13/24   L 04/18/24 ***   Shoulder flexion A: 128 deg 91 deg passively A: 99 deg, mild pain on way down A: 120 deg  A: 125 deg AA: 134 deg  A: 135 deg *  A: 138 deg *   A: 131 deg*  AA: 141 deg   Shoulder abduction       A: 98 deg pain  eccentrically A: 99 deg *    A: 110 deg *   Shoulder internal rotation         Functional combo; upper glute Functional combo: belt line Functional combo: iliac crest   Shoulder external rotation         Functional combo; occiput Functional combo: C4 Functional combo: C4   Elbow flexion              Elbow extension              Wrist flexion              Wrist extension               (Blank rows = not tested) (Key: WFL = within functional limits not formally assessed, * = concordant pain, s = stiffness/stretching sensation, NT = not tested)  Comments:    UPPER EXTREMITY MMT:  MMT Right eval Left eval 01/31/24 R / L 03/13/24 R/L  04/17/24 R/L   Shoulder flexion    5/4- ***   Shoulder extension    5/4- *   Shoulder abduction       Shoulder extension       Shoulder internal rotation       Shoulder external rotation       Elbow flexion       Elbow extension       Grip strength 55# 65# 55# / 80# 70# / 60#   (Blank rows = not tested)  (Key: WFL = within functional limits not formally assessed, * = concordant pain, s = stiffness/stretching sensation, NT = not tested)  Comments: MMT deferred given acuity of surgery   PALPATION:  TTP L UT, LS, rhomboid  [facility] Adult PT Treatment:                                                DATE: 04/17/24 Therapeutic Exercise: *** Manual Therapy: *** Neuromuscular re-ed: *** Therapeutic Activity: *** Modalities: *** Self Care: ***    [facility] Adult PT Treatment:                                                DATE: 03/13/24 Therapeutic Exercise: Swiss ball flexion up wall x12  Swiss ball scaption up wall x10 cues for appropriate positioning/ROM  Seated double ER UB isometric hold (10sec) 2x5 Sidelying L GH horizontal abd/add BW x10; 1# x8 Supine shoulder flexion AROM x10 HEP update + education/discussion  Therapeutic  Activity: MSK assessment + education/discussion Education/discussion re: progress with PT, symptom behavior as it affects activity tolerance, PT goals/POC  Education re: load management and activity modification based on symptom response    [facility] Adult PT Treatment:                                                DATE: 03/08/2024 Manual Therapy: STM (Lt) upper trap, levator scap, cervical paraspinals Neuromuscular re-ed: Side Lying: Shoulder ER x6 --> added 1#DB x6 --> added 3 sec hold x10 Shoulder abd + tactile cues for scapulohumeral rhythm Seated:  Scapula retraction + 10 sec hold Shoulder ER with scapula retraction + 5 sec hold --> added yellow TB + 3 sec hold Shoulder extension press down to neutral + red TB at mid level Rows to neutral + green TB with 3 sec hold Therapeutic Activity: Standing shoulder flexion & scaption, horizontal abd/add slides in standing with red PB 2x10 each Doorway pec stretch --> low 2x30 sec, 60 degrees 2 x 30 sec Pin & stretch upper trap (Lt)    [facility] Adult PT Treatment:                                                DATE: 03/06/24 Therapeutic Exercise: Swiss ball flexion up wall 3x8 cues for comfortable ROM  Sidelying ER 1# 2x8 cues for comfortable ROM Green band row x8 cues for posture HEP discussion/education  Neuromuscular re-ed: Sidelying horizontal abduction 2x10 second round cues for inc volitional UE muscle contraction (improved tolerance noted) Sidelying shoulder flexion LUE x8 cues for form and control RB shoulder extension x8 cues for lat activation and posture    PATIENT EDUCATION: Education details: rationale for interventions, HEP  Person educated: Patient Education method: Explanation, Demonstration, Tactile cues, Verbal cues Education comprehension: verbalized understanding, returned demonstration, verbal cues required, tactile cues required, and needs further education       HOME EXERCISE PROGRAM: Access Code: [email or web address] URL:  [email or web address] Date: 03/13/2024 Prepared by: Alm Jenny  Exercises - Standing Isometric Shoulder Internal Rotation at Doorway  - 3-4 x weekly - 2-3 sets - 6-8 reps - Standing Isometric Shoulder External Rotation with Doorway  - 3-4 x weekly -  2-3 sets - 6-8 reps - Shoulder External Rotation and Scapular Retraction with Resistance  - 1-2 x daily - 7 x weekly - 1-2 sets - 10 reps - 3 sec hold - Standing Shoulder Row with Anchored Resistance  - 1-2 x daily - 7 x weekly - 1-2 sets - 10 reps - 3 sec hold - Shoulder extension with resistance - Neutral  - 1-2 x daily - 7 x weekly - 1-2 sets - 10 reps - Sidelying Shoulder Horizontal Abduction  - 1-2 x daily - 7 x weekly - 1 sets - 8-10 reps - Supine Shoulder Flexion Extension Full Range AROM  - 1-2 x daily - 7 x weekly - 1 sets - 8-10 reps  ASSESSMENT:  CLINICAL IMPRESSION:  04/17/2024: ***  *** Pt arrives w/ report of continued slow/steady progress, continuing to describe difficulty with activities involving reaching and lifting. States he has tried higher level yardwork and household tasks with some pain/limitation. On exam he demonstrates steady overall progress with reaching, mild regression in flexion AROM which he attributes to increased pain on arrival. Tolerates exam and intervention well overall, some transient increases in pain that improve w/ isometric work. HEP update as above. Recommend extension of POC as noted below in order to facilitate improved tolerance to functional tasks and ADLs. Pt departs today's session in no acute distress, all voiced questions/concerns addressed appropriately from PT perspective.     EVAL: Patient is a 73 y.o. gentleman who was seen today for physical therapy evaluation and treatment for L shoulder s/p debridement, SAD, DCE 01/04/24. No red flags or signs of post op complications today, did encourage monitoring of his incision as he states he removed anterior steri strip yesterday.  Endorses difficulty with household tasks and limiting heavier tasks as expected post op. On exam he demonstrates limitations in passive mobility secondary to transient pain. Tolerates exam/HEP well overall without adverse event, HEP and education as above. Recommend trial of skilled PT to address aforementioned deficits with aim of improving functional tolerance and reducing pain with typical activities. Pt departs today's session in no acute distress, all voiced concerns/questions addressed appropriately from PT perspective.      OBJECTIVE IMPAIRMENTS: decreased activity tolerance, decreased endurance, decreased mobility, decreased ROM, decreased strength, impaired perceived functional ability, impaired UE functional use, improper body mechanics, postural dysfunction, and pain.   ACTIVITY LIMITATIONS: carrying, lifting, dressing, reach over head, and hygiene/grooming  PARTICIPATION LIMITATIONS: meal prep, cleaning, laundry, driving, shopping, and community activity  PERSONAL FACTORS: Age, Time since onset of injury/illness/exacerbation, and 3+ comorbidities: hx chest pain, cervical fusion, lumbarfusion, HTN, hypothyroidism, LLE ulcer, R shoulder scope 2021, DM2, essential tremor, diabetic retinopathy are also affecting patient's functional outcome.   REHAB POTENTIAL: Good  CLINICAL DECISION MAKING: Stable/uncomplicated  EVALUATION COMPLEXITY: Low   GOALS:  SHORT TERM GOALS: Target date: 02/14/2024  Pt will demonstrate appropriate understanding and performance of initially prescribed HEP in order to facilitate improved independence with management of symptoms.  Baseline: HEP established  02/14/24: reports good HEP adherence  Goal status: MET  2. Pt will report at least 25% improvement in overall pain levels over past week in order to facilitate improved tolerance to typical daily activities.   Baseline: 5-11.910 02/21/24: 0-8/10 03/13/24: 4-6/10 04/18/24: *** Goal status: ***   LONG  TERM GOALS: Target date: 04/24/2024 (updated 03/13/24)   Pt will score less than or equal to 50% on Quick DASH in order to indicate reduced levels of disability due to shoulder pain (  MDC 16-20pts).  Baseline: 70.45%   02/21/24: 47.7%  Goal status: MET  2.  Pt will demonstrate at least 130 degrees of active L shoulder elevation in order to demonstrate improved tolerance to functional movement patterns such as reaching overhead.  Baseline: see ROM chart above 02/21/24: see ROM chart above (flexion >130 deg, abduction <100) 03/13/24: see ROM chart above 04/18/24: *** Goal status: ***  3.  Pt will demonstrate at least 4+/5 shoulder flex/abd MMT for improved symmetry of UE strength and improved tolerance to functional movements.  Baseline: deferred on eval given acuity of surgery 02/21/24: deferred given proximity to surgery 03/13/24: see MMT chart above 04/18/24: *** Goal status: ***  4. Pt will report at least 50% decrease in overall pain levels in past week in order to facilitate improved tolerance to basic ADLs/mobility.   Baseline: 5-11.9/10 02/21/24: 0-8/10 03/13/24: 4-6/10 04/18/24: *** Goal status: ***  5. Pt will endorse ability to mow lawn with less than 3 pt increase in pain in order to facilitate improved tolerance to usual tasks.  Baseline: unable to perform  02/21/24: not mowing lawn yet  03/13/24: was able to mow the lawn but did increase pain  04/18/24: ***  Goal status: ***  6. Pt will endorse ability to perform usual self-care ADLs such as dressing/hygiene in order to facilitate improved return to PLOF.  Baseline: difficulty w/ upper body dressing, putting on deodorant  02/21/24: still having inc effort with deodorant, difficulty removing pullover shirts  03/13/24: improved tolerance w/ upper body dressing, difficulty w/ deodorant  04/18/24: ***  Goal status: ***  PLAN: (updated 03/13/24)  PT FREQUENCY: 1-2x/week  PT DURATION: 6 weeks  PLANNED INTERVENTIONS:  97164- PT Re-evaluation, 97750- Physical Performance Testing, 97110-Therapeutic exercises, 97530- Therapeutic activity, 97112- Neuromuscular re-education, 97535- Self Care, [id number]- Manual therapy, 97016- Vasopneumatic device, 79439 (1-2 muscles), 20561 (3+ muscles)- Dry Needling, Patient/Family education, Taping, Joint mobilization, Spinal mobilization, Scar mobilization, Cryotherapy, and Moist heat  PLAN FOR NEXT SESSION: continue working on active shoulder mobility, light GH strengthening (non-consecutive days) and periscapular strength/stability   Alm DELENA Jenny PT, DPT 04/17/2024 9:51 AM

## 2024-04-18 ENCOUNTER — Ambulatory Visit: Attending: Family Medicine | Admitting: Physical Therapy

## 2024-04-18 ENCOUNTER — Encounter: Payer: Self-pay | Admitting: Physical Therapy

## 2024-04-18 DIAGNOSIS — M25512 Pain in left shoulder: Secondary | ICD-10-CM | POA: Insufficient documentation

## 2024-04-18 DIAGNOSIS — R600 Localized edema: Secondary | ICD-10-CM | POA: Diagnosis present

## 2024-04-18 DIAGNOSIS — M6281 Muscle weakness (generalized): Secondary | ICD-10-CM | POA: Diagnosis present

## 2024-04-23 NOTE — Therapy (Signed)
 OUTPATIENT PHYSICAL THERAPY TREATMENT   Patient Name: Gregory West. MRN: [id number] DOB:1950/10/31, 73 y.o., male Today's Date: 04/24/2024      END OF SESSION:  PT End of Session - 04/24/24 0927     Visit Number 17    Number of Visits 27    Date for Recertification  05/30/24    Authorization Type [facility]    Authorization - Visit Number 2    Authorization - Number of Visits 15    PT Start Time 0930    PT Stop Time 0109    PT Time Calculation (min) 939 min            Past Medical History:  Diagnosis Date   Allergy not sure   Arteriosclerosis of coronary artery 04/05/2024   Arthritis    Chest pain    a. Normal cors 2001. b. Neg stress test 2012, 05/2014.   Diabetes mellitus without complication (HCC)    GERD (gastroesophageal reflux disease)    Hyperlipidemia    Hypertension    Hypertensive response to exercise    Hypothyroidism    Obesity    Obstructive sleep apnea    Currently untreated    PONV (postoperative nausea and vomiting)    Sleep apnea    +cpap   Ulcer of left lower extremity (HCC) 12/16/2022   Past Surgical History:  Procedure Laterality Date   CARDIAC CATHETERIZATION     CERVICAL FUSION     HERNIA REPAIR     KNEE ARTHROSCOPY  1984   both knee   [facility]   POSTERIOR LUMBAR FUSION 2 WITH HARDWARE REMOVAL Left 01/04/2024   Procedure: ARTHROSCOPY, SHOULDER WITH DEBRIDEMENT;  Surgeon: Jerri Kay HERO, MD;  Location: [facility];  Service: Orthopedics;  Laterality: Left;  LEFT SHOULDER ARTHROSCOPY, EXTENSIVE DEBRIDEMENT, DISTAL CLAVICLE EXCISION, SUBACROMIAL DECOMPRESSION   SHOULDER ARTHROSCOPY WITH SUBACROMIAL DECOMPRESSION Right 09/13/2019   Procedure: RIGHT SHOULDER ARTHROSCOPY WITH EXTENSIVE DEBRIDEMENT, SUBACROMIAL DECOMPRESSION,;  Surgeon: Vernetta Lonni GRADE, MD;  Location: [facility];  Service: Orthopedics;  Laterality: Right;   SPINE SURGERY  1st Sept 1977   2nd Feb 2011    Patient Active Problem List   Diagnosis Date Noted   Osteoarthritis 04/05/2024   Hypothyroidism 04/05/2024   Arteriosclerosis of coronary artery 04/05/2024   Acute right-sided low back pain without sciatica 04/05/2024   Arthritis of left acromioclavicular joint 01/04/2024   Diabetic retinopathy (HCC) 12/29/2023   On long term drug therapy 10/27/2023   Pure hypercholesterolemia 10/27/2023   Viral URI 06/28/2023   Splinter in skin 03/08/2023   COVID 02/02/2023   Bee sting reaction 01/11/2023   Ulcer of left lower extremity (HCC) 12/16/2022   Bilateral hand numbness 10/06/2022   Atypical pigmented lesion 09/29/2022   ED (erectile dysfunction) 09/29/2022   Fusion of spine of cervical region 09/14/2022   Type 2 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 10/30/2021   Exercise counseling 07/29/2021   Other specified counseling 07/29/2021   Primary open-angle glaucoma, bilateral, mild stage 07/29/2021   Diabetic neuropathy (HCC) 07/31/2020   Abdominal aortic aneurysm 07/31/2020   Insomnia 07/31/2020   GERD (gastroesophageal reflux disease) 01/09/2020   Impingement syndrome of left shoulder 08/02/2019   Interstitial pulmonary disease (HCC) 06/29/2018   Hypertension associated with diabetes (HCC) 07/13/2017   Benign essential tremor 07/13/2017   Kidney cyst, acquired 01/24/2017   Seborrheic keratoses  04/01/2016   Diverticulosis of colon without hemorrhage 02/26/2016   Anxiety state 10/02/2015   BPH (benign prostatic hyperplasia) 07/31/2015   Carpal tunnel syndrome, left upper limb 06/04/2015   Hypogonadotropic hypogonadism 03/06/2015   Vitamin D  deficiency 03/06/2015   History of pancreatitis 03/06/2015   Lumbar spondylosis 03/04/2015   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   Obstructive sleep apnea 05/15/2014   Hyperlipidemia 05/15/2014    Glaucoma suspect 10/21/2011    PCP: Alvia Bring, DO  REFERRING PROVIDER: Jerri Kay HERO, MD  REFERRING DIAG: [phone] (ICD-10-CM) - Chronic left shoulder pain M75.42 (ICD-10-CM) - Impingement syndrome of left shoulder  THERAPY DIAG:  Left shoulder pain, unspecified chronicity  Muscle weakness (generalized)  Localized edema  Rationale for Evaluation and Treatment: Rehabilitation  ONSET DATE: scope + DCE/SAD 01/04/24 L shoulder  SUBJECTIVE:                                                                                                                                                                                      SUBJECTIVE STATEMENT: 04/24/2024: cleaned out gutters yesterday, tried to use the R arm more. Exercises going well at home. 5/10 pain, particularly with reaching backwards. No other new updates.    EVAL: Pt endorses chronic history with shoulder injuries, very active in sports, athletics, and [employer]. Worsening over past year prior to surgery. States since surgery pain has been steadily improving, is having difficulty w/ self care tasks, household tasks. Not performing heavier tasks around yard or home. Reports a bit of swelling in shoulder as expected, and bruising that is improving. States surgical follow up went well, reports being advised sling use PRN for comfort and no lifting 5# or greater. States yesterday he did remove one of his steri strips and it had some mild bleeding initially but no issues since. Denies constitutional symptoms (fevers, chills, SOB, etc). Has a bit of tingling in L lateral palm which he states is chronic, unchanged since surgery. Hand dominance: Right  PERTINENT HISTORY: DM, hx chest pain, cervical fusion, lumbar fusion (pt denies), HTN, hypothyroidism, LLE ulcer, R shoulder scope 2021, DM2, essential tremor, diabetic retinopathy Self reports aortic aneurysm, describes as stable and follows with cardiology  PAIN:  Are you having  pain: 5/10 at present, 0-6/10 in past week (updated 04/18/24)    Per eval:  Location/description: pinching in front of shoulder, posterior shoulder Best-worst over past week: 5-11.9/10  - aggravating factors: difficulty positioning while sitting, use of arm, picking up pots/pans, opening jar - Easing factors: sling use, icing, posiitoning    PRECAUTIONS: s/p shoulder scope, SAD, DCE (no formal restrictions on referral).   RED  FLAGS: No fevers/chills, no SOB or other constitutional symptoms  WEIGHT BEARING RESTRICTIONS: No  FALLS:  Has patient fallen in last 6 months? 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  DIAGNOSTIC FINDINGS:  S/p L shoulder debridement, SAD, DCE 01/04/24  PATIENT SURVEYS:  QuickDASH: 70.45%   02/21/24 quickDASH: 47.7%   04/18/24 quickdash: 25%   COGNITION: Overall cognitive status: Within functional limits for tasks assessed     SENSATION: Numbness L hand, chronic and stable, unchanging since surgery per pt report  EDEMA/INSPECTION:  Incisions well appearing without drainage or erythema, steri strips over posterior and lateral incisions, anterior does not have steri strip but no apparent opening of incision ; gross edema apparent about L GH joint WNL; mild bruising upper arm which pt states is improving since surgery  UPPER EXTREMITY ROM:  A/PROM Right eval Left eval Left 01/24/24 Left 01/31/24 L 02/07/24 L 02/09/24 L 02/16/24 L 02/21/24 L 02/28/24 L 03/06/24 L 03/13/24   L 04/18/24    Shoulder flexion A: 128 deg 91 deg  passively A: 99 deg, mild pain on way down A: 120 deg  A: 125 deg AA: 134 deg  A: 135 deg *  A: 138 deg *   A: 131 deg*  AA: 141 deg A: 142 deg *  Shoulder abduction       A: 98 deg pain eccentrically A: 99 deg *    A: 110 deg * A: 135 deg *  Shoulder internal rotation         Functional combo; upper glute Functional combo: belt line Functional combo: iliac crest FC: iliac crest  Shoulder external rotation         Functional combo; occiput Functional combo: C4 Functional combo: C4 FC: C4  Elbow flexion              Elbow extension              Wrist flexion              Wrist extension               (Blank rows = not tested) (Key: WFL = within functional limits not formally assessed, * = concordant pain, s = stiffness/stretching sensation, NT = not tested)  Comments:    UPPER EXTREMITY MMT:  MMT Right eval Left eval 01/31/24 R / L 03/13/24 R/L  04/17/24 R/L   Shoulder flexion    5/4- 5/4 (mild pain)   Shoulder extension    5/4- * 5/4 (mild pain)  Shoulder abduction       Shoulder extension       Shoulder internal rotation       Shoulder external rotation       Elbow flexion       Elbow extension       Grip strength 55# 65# 55# / 80# 70# / 60#   (Blank rows = not tested)  (Key: WFL = within functional limits not formally assessed, * = concordant pain, s = stiffness/stretching sensation, NT = not  tested)  Comments: MMT deferred given acuity of surgery   PALPATION:  TTP L UT, LS, rhomboid 04/18/24: globally tender L shoulder anteriorly, trigger points L LS/UT                                                                                                                             [facility] Adult PT Treatment:                                                DATE: 04/24/24 Therapeutic Exercise: Swiss ball flexion up wall x10  Swiss ball flexion up wall + gentle push x10  Seated BIL shoulder flexion to 90 deg (5# DB using both hands) 2x10 YB seated OH press 3x5 cues for setup  HEP  update + education/handout, discussed modification PRN and ensuring appropriate rest days with shoulder strengthening  Neuromuscular re-ed: Swiss ball closed chain mini circles x10 CW/CCW in 90 deg flexion Swiss ball abduction iso in neutral 2x10 cues for positioning and comfortable force output  RB wall clock 2x5 each UE cues for setup and apporpriate tension    [facility] Adult PT Treatment:                                                DATE: 04/17/24 Therapeutic Exercise: 6# bicep curl 2x5 neutral and supinated superset  Fwd chest press iso hold 3#; x45sec  HEP update + education/handout  Neuromuscular re-ed: YB at wrist, BIL scaption x12  Wall clock x5 each UE unresisted; YB x6 BIL cues for form and comfortable ROM  Therapeutic Activity: MSK assessment + education Education/discussion re: progress with PT, symptom behavior as it affects activity tolerance, PT goals/POC     [facility] Adult PT Treatment:                                                DATE: 03/13/24 Therapeutic Exercise: Swiss ball flexion up wall x12  Swiss ball scaption up wall x10 cues for appropriate positioning/ROM  Seated double ER UB isometric hold (10sec) 2x5 Sidelying L GH horizontal abd/add BW x10; 1# x8 Supine shoulder flexion AROM x10 HEP update + education/discussion  Therapeutic Activity: MSK assessment + education/discussion Education/discussion re: progress with PT, symptom behavior as it affects activity tolerance, PT goals/POC  Education re: load management and activity modification based on symptom response   PATIENT EDUCATION: Education details: rationale for interventions, HEP  Person educated: Patient Education method: Explanation, Demonstration, Tactile cues, Verbal cues Education comprehension: verbalized understanding, returned demonstration, verbal cues required, tactile cues required, and  needs further education       HOME EXERCISE PROGRAM: Access Code: [email or web address] URL:  [email or web address] Date: 04/18/2024 Prepared by: Alm Jenny  Exercises - Standing Shoulder Row with Anchored Resistance  - 1-2 x daily - 7 x weekly - 1-2 sets - 10 reps - 3 sec hold - Shoulder extension with resistance - Neutral  - 1-2 x daily - 7 x weekly - 1-2 sets - 10 reps - Sidelying Shoulder Horizontal Abduction  - 1-2 x daily - 7 x weekly - 1 sets - 8-10 reps - Supine Shoulder Flexion Extension Full Range AROM  - 1-2 x daily - 7 x weekly - 1 sets - 8-10 reps - Wall Clock with Theraband  - 3-4 x weekly - 2-3 sets - 8 reps - Shoulder External Rotation and Scapular Retraction with Resistance  - 3-4 x weekly - 1-2 sets - 10 reps - 3 sec hold  ASSESSMENT:  CLINICAL IMPRESSION:  04/24/2024: Pt arrives w/ 5/10 pain, no issues after last session. Today continuing to work on closed chain GH stability, GH strength using contralateral UE as needed (incorporating 5# DB to simulate home setup). Also able to progress to lightly resisted OH press - initially uncomfortable but improves w/ repetition. No adverse events, reports feeling much better as session goes on. Recommend continuing along current POC in order to address relevant deficits and improve functional tolerance. Pt departs today's session in no acute distress, all voiced questions/concerns addressed appropriately from PT perspective.     EVAL: Patient is a 73 y.o. gentleman who was seen today for physical therapy evaluation and treatment for L shoulder s/p debridement, SAD, DCE 01/04/24. No red flags or signs of post op complications today, did encourage monitoring of his incision as he states he removed anterior steri strip yesterday. Endorses difficulty with household tasks and limiting heavier tasks as expected post op. On exam he demonstrates limitations in passive mobility secondary to transient pain. Tolerates exam/HEP well overall without adverse event, HEP and education as above. Recommend trial of skilled PT to  address aforementioned deficits with aim of improving functional tolerance and reducing pain with typical activities. Pt departs today's session in no acute distress, all voiced concerns/questions addressed appropriately from PT perspective.      OBJECTIVE IMPAIRMENTS: decreased activity tolerance, decreased endurance, decreased mobility, decreased ROM, decreased strength, impaired perceived functional ability, impaired UE functional use, improper body mechanics, postural dysfunction, and pain.   ACTIVITY LIMITATIONS: carrying, lifting, dressing, reach over head, and hygiene/grooming  PARTICIPATION LIMITATIONS: meal prep, cleaning, laundry, driving, shopping, and community activity  PERSONAL FACTORS: Age, Time since onset of injury/illness/exacerbation, and 3+ comorbidities: hx chest pain, cervical fusion, lumbarfusion, HTN, hypothyroidism, LLE ulcer, R shoulder scope 2021, DM2, essential tremor, diabetic retinopathy are also affecting patient's functional outcome.   REHAB POTENTIAL: Good  CLINICAL DECISION MAKING: Stable/uncomplicated  EVALUATION COMPLEXITY: Low   GOALS:  SHORT TERM GOALS: Target date: 02/14/2024  Pt will demonstrate appropriate understanding and performance of initially prescribed HEP in order to facilitate improved independence with management of symptoms.  Baseline: HEP established  02/14/24: reports good HEP adherence  Goal status: MET  2. Pt will report at least 25% improvement in overall pain levels over past week in order to facilitate improved tolerance to typical daily activities.   Baseline: 5-11.910 02/21/24: 0-8/10 03/13/24: 4-6/10 04/18/24: 0-6/10 Goal status: MET   LONG TERM GOALS: Target date: 05/30/2024  (Updated 04/18/24)   Pt will score less than or equal to 50% on  Quick DASH in order to indicate reduced levels of disability due to shoulder pain (MDC 16-20pts).  Baseline: 70.45%   02/21/24: 47.7%  Goal status: MET  2.  Pt will demonstrate at  least 130 degrees of active L shoulder elevation in order to demonstrate improved tolerance to functional movement patterns such as reaching overhead.  Baseline: see ROM chart above 02/21/24: see ROM chart above (flexion >130 deg, abduction <100) 03/13/24: see ROM chart above 04/18/24: see ROM chart above Goal status: MET  3.  Pt will demonstrate at least 4+/5 shoulder flex/abd MMT for improved symmetry of UE strength and improved tolerance to functional movements.  Baseline: deferred on eval given acuity of surgery 02/21/24: deferred given proximity to surgery 03/13/24: see MMT chart above 04/18/24: see MMT chart above Goal status: PROGRESSING  4. Pt will report at least 50% decrease in overall pain levels in past week in order to facilitate improved tolerance to basic ADLs/mobility.   Baseline: 5-11.9/10 02/21/24: 0-8/10 03/13/24: 4-6/10 04/18/24: 0-6/10 Goal status: PROGRESSING  5. Pt will endorse ability to mow lawn with less than 3 pt increase in pain in order to facilitate improved tolerance to usual tasks.  Baseline: unable to perform  02/21/24: not mowing lawn yet  03/13/24: was able to mow the lawn but did increase pain  04/18/24: reports some increase in pain with yardwork  Goal status: ONGOING  6. Pt will endorse ability to perform usual self-care ADLs such as dressing/hygiene in order to facilitate improved return to PLOF.  Baseline: difficulty w/ upper body dressing, putting on deodorant  02/21/24: still having inc effort with deodorant, difficulty removing pullover shirts  03/13/24: improved tolerance w/ upper body dressing, difficulty w/ deodorant  04/18/24: difficulty with jackets, mild pain/difficulty with deodorant but reports improvement  Goal status: PROGRESSING  PLAN: (updated 04/18/24)  PT FREQUENCY: 1-2x/week  PT DURATION: 6 weeks  PLANNED INTERVENTIONS: 97164- PT Re-evaluation, 97750- Physical Performance Testing, 97110-Therapeutic exercises, 97530-  Therapeutic activity, 97112- Neuromuscular re-education, 97535- Self Care, [id number]- Manual therapy, 97016- Vasopneumatic device, 79439 (1-2 muscles), 20561 (3+ muscles)- Dry Needling, Patient/Family education, Taping, Joint mobilization, Spinal mobilization, Scar mobilization, Cryotherapy, and Moist heat  PLAN FOR NEXT SESSION: continue working on active shoulder mobility, light GH strengthening (non-consecutive days) and periscapular strength/stability   Alm DELENA Jenny PT, DPT 04/24/2024 10:13 AM

## 2024-04-24 ENCOUNTER — Ambulatory Visit: Admitting: Physical Therapy

## 2024-04-24 ENCOUNTER — Encounter: Payer: Self-pay | Admitting: Physical Therapy

## 2024-04-24 DIAGNOSIS — M25512 Pain in left shoulder: Secondary | ICD-10-CM | POA: Diagnosis not present

## 2024-04-24 DIAGNOSIS — M6281 Muscle weakness (generalized): Secondary | ICD-10-CM

## 2024-04-24 DIAGNOSIS — R600 Localized edema: Secondary | ICD-10-CM

## 2024-05-01 ENCOUNTER — Ambulatory Visit: Attending: Orthopaedic Surgery | Admitting: Physical Therapy

## 2024-05-01 NOTE — Therapy (Signed)
 OUTPATIENT PHYSICAL THERAPY TREATMENT   Patient Name: Gregory GRANTZ [name] MRN: [id number] DOB:[birth year], 73 y.o., male Today's Date: 05/02/2024      END OF SESSION:  PT End of Session - 05/02/24 1406     Visit Number 18    Number of Visits 27    Date for Recertification  05/30/24    Authorization Type [facility]    Authorization - Visit Number 3    Authorization - Number of Visits 15    Progress Note Due on Visit 25    PT Start Time 1406   late check in   PT Stop Time 1445    PT Time Calculation (min) 39 min             Past Medical History:  Diagnosis Date   Allergy not sure   Arteriosclerosis of coronary artery 04/05/2024   Arthritis    Chest pain    a. Normal cors 2001. b. Neg stress test 2012, 05/2014.   Diabetes mellitus without complication (HCC)    GERD (gastroesophageal reflux disease)    Hyperlipidemia    Hypertension    Hypertensive response to exercise    Hypothyroidism    Obesity    Obstructive sleep apnea    Currently untreated    PONV (postoperative nausea and vomiting)    Sleep apnea    +cpap   Ulcer of left lower extremity (HCC) 12/16/2022   Past Surgical History:  Procedure Laterality Date   CARDIAC CATHETERIZATION     CERVICAL FUSION     HERNIA REPAIR     KNEE ARTHROSCOPY  1984   both knee   [facility]   POSTERIOR LUMBAR FUSION 2 WITH HARDWARE REMOVAL Left 01/04/2024   Procedure: ARTHROSCOPY, SHOULDER WITH DEBRIDEMENT;  Surgeon: Jerri Kay HERO, MD;  Location: [facility];  Service: Orthopedics;  Laterality: Left;  LEFT SHOULDER ARTHROSCOPY, EXTENSIVE DEBRIDEMENT, DISTAL CLAVICLE EXCISION, SUBACROMIAL DECOMPRESSION   SHOULDER ARTHROSCOPY WITH SUBACROMIAL DECOMPRESSION Right 09/13/2019   Procedure: RIGHT SHOULDER ARTHROSCOPY WITH EXTENSIVE DEBRIDEMENT, SUBACROMIAL DECOMPRESSION,;  Surgeon: Vernetta Lonni GRADE, MD;  Location: [facility];  Service: Orthopedics;  Laterality: Right;   SPINE  SURGERY  1st Sept 1977   2nd Feb 2011   Patient Active Problem List   Diagnosis Date Noted   Osteoarthritis 04/05/2024   Hypothyroidism 04/05/2024   Arteriosclerosis of coronary artery 04/05/2024   Acute right-sided low back pain without sciatica 04/05/2024   Arthritis of left acromioclavicular joint 01/04/2024   Diabetic retinopathy (HCC) 12/29/2023   On long term drug therapy 10/27/2023   Pure hypercholesterolemia 10/27/2023   Viral URI 06/28/2023   Splinter in skin 03/08/2023   COVID 02/02/2023   Bee sting reaction 01/11/2023   Ulcer of left lower extremity (HCC) 12/16/2022   Bilateral hand numbness 10/06/2022   Atypical pigmented lesion 09/29/2022   ED (erectile dysfunction) 09/29/2022   Fusion of spine of cervical region 09/14/2022   Type 2 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 10/30/2021   Exercise counseling 07/29/2021   Other specified counseling 07/29/2021   Primary open-angle glaucoma, bilateral, mild stage 07/29/2021   Diabetic neuropathy (HCC) 07/31/2020   Abdominal aortic aneurysm 07/31/2020   Insomnia 07/31/2020   GERD (gastroesophageal reflux disease) 01/09/2020   Impingement syndrome of left shoulder 08/02/2019   Interstitial pulmonary disease (HCC) 06/29/2018   Hypertension associated with diabetes (HCC) 07/13/2017  Benign essential tremor 07/13/2017   Kidney cyst, acquired 01/24/2017   Seborrheic keratoses 04/01/2016   Diverticulosis of colon without hemorrhage 02/26/2016   Anxiety state 10/02/2015   BPH (benign prostatic hyperplasia) 07/31/2015   Carpal tunnel syndrome, left upper limb 06/04/2015   Hypogonadotropic hypogonadism 03/06/2015   Vitamin D  deficiency 03/06/2015   History of pancreatitis 03/06/2015   Lumbar spondylosis 03/04/2015   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   Obstructive sleep apnea  05/15/2014   Hyperlipidemia 05/15/2014   Glaucoma suspect 10/21/2011    PCP: Alvia Bring, DO  REFERRING PROVIDER: Jerri Kay HERO, MD  REFERRING DIAG: [phone] (ICD-10-CM) - Chronic left shoulder pain M75.42 (ICD-10-CM) - Impingement syndrome of left shoulder  THERAPY DIAG:  Left shoulder pain, unspecified chronicity  Muscle weakness (generalized)  Localized edema  Rationale for Evaluation and Treatment: Rehabilitation  ONSET DATE: scope + DCE/SAD 01/04/24 L shoulder  SUBJECTIVE:                                                                                                                                                                                      SUBJECTIVE STATEMENT: 05/02/2024: states he is tired and sore - had some roofing/leaking issues at his church yesterday, helped with clean up yesterday and today. Shoulder sore quite sore today. No other new updates.    EVAL: Pt endorses chronic history with shoulder injuries, very active in sports, athletics, and [employer]. Worsening over past year prior to surgery. States since surgery pain has been steadily improving, is having difficulty w/ self care tasks, household tasks. Not performing heavier tasks around yard or home. Reports a bit of swelling in shoulder as expected, and bruising that is improving. States surgical follow up went well, reports being advised sling use PRN for comfort and no lifting 5# or greater. States yesterday he did remove one of his steri strips and it had some mild bleeding initially but no issues since. Denies constitutional symptoms (fevers, chills, SOB, etc). Has a bit of tingling in L lateral palm which he states is chronic, unchanged since surgery. Hand dominance: Right  PERTINENT HISTORY: DM, hx chest pain, cervical fusion, lumbar fusion (pt denies), HTN, hypothyroidism, LLE ulcer, R shoulder scope 2021, DM2, essential tremor, diabetic retinopathy Self reports aortic aneurysm, describes as  stable and follows with cardiology  PAIN:  Are you having pain: 4/10 at present, 0-6/10 in past week (updated 05/02/24)    Per eval:  Location/description: pinching in front of shoulder, posterior shoulder Best-worst over past week: 5-11.9/10  - aggravating factors: difficulty positioning while sitting, use of arm, picking up pots/pans, opening jar - Easing factors: sling use,  icing, posiitoning    PRECAUTIONS: s/p shoulder scope, SAD, DCE (no formal restrictions on referral).   RED FLAGS: No fevers/chills, no SOB or other constitutional symptoms  WEIGHT BEARING RESTRICTIONS: No  FALLS:  Has patient fallen in last 6 months? 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  DIAGNOSTIC FINDINGS:  S/p L shoulder debridement, SAD, DCE 01/04/24  PATIENT SURVEYS:  QuickDASH: 70.45%   02/21/24 quickDASH: 47.7%   04/18/24 quickdash: 25%   COGNITION: Overall cognitive status: Within functional limits for tasks assessed     SENSATION: Numbness L hand, chronic and stable, unchanging since surgery per pt report  EDEMA/INSPECTION:  Incisions well appearing without drainage or erythema, steri strips over posterior and lateral incisions, anterior does not have steri strip but no apparent opening of incision ; gross edema apparent about L GH joint WNL; mild bruising upper arm which pt states is improving since surgery  UPPER EXTREMITY ROM:  A/PROM Right eval Left eval Left 01/24/24 Left 01/31/24 L 02/07/24 L 02/09/24 L 02/16/24 L 02/21/24 L 02/28/24 L 03/06/24 L  03/13/24   L 04/18/24    Shoulder flexion A: 128 deg 91 deg passively A: 99 deg, mild pain on way down A: 120 deg  A: 125 deg AA: 134 deg  A: 135 deg *  A: 138 deg *   A: 131 deg*  AA: 141 deg A: 142 deg *  Shoulder abduction       A: 98 deg pain eccentrically A: 99 deg *    A: 110 deg * A: 135 deg *  Shoulder internal rotation         Functional combo; upper glute Functional combo: belt line Functional combo: iliac crest FC: iliac crest  Shoulder external rotation         Functional combo; occiput Functional combo: C4 Functional combo: C4 FC: C4  Elbow flexion              Elbow extension              Wrist flexion              Wrist extension               (Blank rows = not tested) (Key: WFL = within functional limits not formally assessed, * = concordant pain, s = stiffness/stretching sensation, NT = not tested)  Comments:    UPPER EXTREMITY MMT:  MMT Right eval Left eval 01/31/24 R / L 03/13/24 R/L  04/17/24 R/L   Shoulder flexion    5/4- 5/4 (mild pain)   Shoulder extension    5/4- * 5/4 (mild pain)  Shoulder abduction       Shoulder extension       Shoulder internal rotation       Shoulder external rotation       Elbow flexion       Elbow extension       Grip strength 55# 65# 55# / 80# 70# / 60#   (Blank rows = not tested)  (Key:  WFL = within functional limits not formally assessed, * = concordant pain, s = stiffness/stretching sensation, NT = not tested)  Comments: MMT deferred given acuity of surgery   PALPATION:  TTP L UT, LS, rhomboid 04/18/24: globally tender L shoulder anteriorly, trigger points L LS/UT                                                                                                                              [facility] Adult PT Treatment:                                                DATE: 05/02/24 Therapeutic Exercise: Swiss ball flexion up wall 2x12  Seated swiss ball horizontal abd 2x8  Unweighted OH press 2x10  HEP  discussion/education  Neuromuscular re-ed: Swiss ball closed chain mini circles x10 CW/CCW in 90 deg flexion Swiss ball shoulder elevation (squeezing ball) 2x10 cues for posture  Self Care: Education re: pacing of tasks, activity modification, symptom behavior and possible contributing factors, managing fatigue/workload   [facility] Adult PT Treatment:                                                DATE: 04/24/24 Therapeutic Exercise: Swiss ball flexion up wall x10  Swiss ball flexion up wall + gentle push x10  Seated BIL shoulder flexion to 90 deg (5# DB using both hands) 2x10 YB seated OH press 3x5 cues for setup  HEP update + education/handout, discussed modification PRN and ensuring appropriate rest days with shoulder strengthening  Neuromuscular re-ed: Swiss ball closed chain mini circles x10 CW/CCW in 90 deg flexion Swiss ball abduction iso in neutral 2x10 cues for positioning and comfortable force output  RB wall clock 2x5 each UE cues for setup and apporpriate tension    [facility] Adult PT Treatment:                                                DATE: 04/17/24 Therapeutic Exercise: 6# bicep curl 2x5 neutral and supinated superset  Fwd chest press iso hold 3#; x45sec  HEP update + education/handout  Neuromuscular re-ed: YB at wrist, BIL scaption x12  Wall clock x5 each UE unresisted; YB x6 BIL cues for form and comfortable ROM  Therapeutic Activity: MSK assessment + education Education/discussion re: progress with PT, symptom behavior as it affects activity tolerance, PT goals/POC     [facility] Adult PT Treatment:  DATE: 03/13/24 Therapeutic Exercise: Swiss ball flexion up wall x12  Swiss ball scaption up wall x10 cues for appropriate positioning/ROM  Seated double ER UB isometric hold (10sec) 2x5 Sidelying L GH horizontal abd/add BW x10; 1# x8 Supine shoulder flexion AROM x10 HEP update + education/discussion  Therapeutic  Activity: MSK assessment + education/discussion Education/discussion re: progress with PT, symptom behavior as it affects activity tolerance, PT goals/POC  Education re: load management and activity modification based on symptom response   PATIENT EDUCATION: Education details: rationale for interventions, HEP  Person educated: Patient Education method: Explanation, Demonstration, Tactile cues, Verbal cues Education comprehension: verbalized understanding, returned demonstration, verbal cues required, tactile cues required, and needs further education       HOME EXERCISE PROGRAM: Access Code: [email or web address] URL: [email or web address] Date: 04/18/2024 Prepared by: Alm Jenny  Exercises - Standing Shoulder Row with Anchored Resistance  - 1-2 x daily - 7 x weekly - 1-2 sets - 10 reps - 3 sec hold - Shoulder extension with resistance - Neutral  - 1-2 x daily - 7 x weekly - 1-2 sets - 10 reps - Sidelying Shoulder Horizontal Abduction  - 1-2 x daily - 7 x weekly - 1 sets - 8-10 reps - Supine Shoulder Flexion Extension Full Range AROM  - 1-2 x daily - 7 x weekly - 1 sets - 8-10 reps - Wall Clock with Theraband  - 3-4 x weekly - 2-3 sets - 8 reps - Shoulder External Rotation and Scapular Retraction with Resistance  - 3-4 x weekly - 1-2 sets - 10 reps - 3 sec hold  ASSESSMENT:  CLINICAL IMPRESSION:  05/02/2024: Pt arrives w/ 4/10 pain, endorses significant fatigue/soreness from increased activities over past few days. Given this we spend time w/ education/discussion for self care as above, increased time between exercises to manage muscular fatigue, emphasis on comfortable mobility and motor control. He tolerates activity relatively well with rest breaks, some fatigue but notes improved stiffness/soreness as session goes on. No adverse events. Recommend continuing along current POC in order to address relevant deficits and improve functional tolerance. Pt departs today's session in no  acute distress, all voiced questions/concerns addressed appropriately from PT perspective.     EVAL: Patient is a 73 y.o. gentleman who was seen today for physical therapy evaluation and treatment for L shoulder s/p debridement, SAD, DCE 01/04/24. No red flags or signs of post op complications today, did encourage monitoring of his incision as he states he removed anterior steri strip yesterday. Endorses difficulty with household tasks and limiting heavier tasks as expected post op. On exam he demonstrates limitations in passive mobility secondary to transient pain. Tolerates exam/HEP well overall without adverse event, HEP and education as above. Recommend trial of skilled PT to address aforementioned deficits with aim of improving functional tolerance and reducing pain with typical activities. Pt departs today's session in no acute distress, all voiced concerns/questions addressed appropriately from PT perspective.      OBJECTIVE IMPAIRMENTS: decreased activity tolerance, decreased endurance, decreased mobility, decreased ROM, decreased strength, impaired perceived functional ability, impaired UE functional use, improper body mechanics, postural dysfunction, and pain.   ACTIVITY LIMITATIONS: carrying, lifting, dressing, reach over head, and hygiene/grooming  PARTICIPATION LIMITATIONS: meal prep, cleaning, laundry, driving, shopping, and community activity  PERSONAL FACTORS: Age, Time since onset of injury/illness/exacerbation, and 3+ comorbidities: hx chest pain, cervical fusion, lumbarfusion, HTN, hypothyroidism, LLE ulcer, R shoulder scope 2021, DM2, essential tremor, diabetic retinopathy are also affecting patient's functional outcome.   REHAB  POTENTIAL: Good  CLINICAL DECISION MAKING: Stable/uncomplicated  EVALUATION COMPLEXITY: Low   GOALS:  SHORT TERM GOALS: Target date: 02/14/2024  Pt will demonstrate appropriate understanding and performance of initially prescribed HEP in order to  facilitate improved independence with management of symptoms.  Baseline: HEP established  02/14/24: reports good HEP adherence  Goal status: MET  2. Pt will report at least 25% improvement in overall pain levels over past week in order to facilitate improved tolerance to typical daily activities.   Baseline: 5-11.910 02/21/24: 0-8/10 03/13/24: 4-6/10 04/18/24: 0-6/10 Goal status: MET   LONG TERM GOALS: Target date: 05/30/2024  (Updated 04/18/24)   Pt will score less than or equal to 50% on Quick DASH in order to indicate reduced levels of disability due to shoulder pain (MDC 16-20pts).  Baseline: 70.45%   02/21/24: 47.7%  Goal status: MET  2.  Pt will demonstrate at least 130 degrees of active L shoulder elevation in order to demonstrate improved tolerance to functional movement patterns such as reaching overhead.  Baseline: see ROM chart above 02/21/24: see ROM chart above (flexion >130 deg, abduction <100) 03/13/24: see ROM chart above 04/18/24: see ROM chart above Goal status: MET  3.  Pt will demonstrate at least 4+/5 shoulder flex/abd MMT for improved symmetry of UE strength and improved tolerance to functional movements.  Baseline: deferred on eval given acuity of surgery 02/21/24: deferred given proximity to surgery 03/13/24: see MMT chart above 04/18/24: see MMT chart above Goal status: PROGRESSING  4. Pt will report at least 50% decrease in overall pain levels in past week in order to facilitate improved tolerance to basic ADLs/mobility.   Baseline: 5-11.9/10 02/21/24: 0-8/10 03/13/24: 4-6/10 04/18/24: 0-6/10 Goal status: PROGRESSING  5. Pt will endorse ability to mow lawn with less than 3 pt increase in pain in order to facilitate improved tolerance to usual tasks.  Baseline: unable to perform  02/21/24: not mowing lawn yet  03/13/24: was able to mow the lawn but did increase pain  04/18/24: reports some increase in pain with yardwork  Goal status: ONGOING  6. Pt  will endorse ability to perform usual self-care ADLs such as dressing/hygiene in order to facilitate improved return to PLOF.  Baseline: difficulty w/ upper body dressing, putting on deodorant  02/21/24: still having inc effort with deodorant, difficulty removing pullover shirts  03/13/24: improved tolerance w/ upper body dressing, difficulty w/ deodorant  04/18/24: difficulty with jackets, mild pain/difficulty with deodorant but reports improvement  Goal status: PROGRESSING  PLAN: (updated 04/18/24)  PT FREQUENCY: 1-2x/week  PT DURATION: 6 weeks  PLANNED INTERVENTIONS: 97164- PT Re-evaluation, 97750- Physical Performance Testing, 97110-Therapeutic exercises, 97530- Therapeutic activity, 97112- Neuromuscular re-education, 97535- Self Care, [id number]- Manual therapy, 97016- Vasopneumatic device, 79439 (1-2 muscles), 20561 (3+ muscles)- Dry Needling, Patient/Family education, Taping, Joint mobilization, Spinal mobilization, Scar mobilization, Cryotherapy, and Moist heat  PLAN FOR NEXT SESSION: continue working on active shoulder mobility, light GH strengthening (non-consecutive days) and periscapular strength/stability   Alm DELENA Jenny PT, DPT 05/02/2024 3:43 PM

## 2024-05-02 ENCOUNTER — Encounter: Payer: Self-pay | Admitting: Physical Therapy

## 2024-05-02 ENCOUNTER — Ambulatory Visit: Attending: Orthopaedic Surgery | Admitting: Physical Therapy

## 2024-05-02 ENCOUNTER — Ambulatory Visit: Admitting: Dermatology

## 2024-05-02 DIAGNOSIS — M25512 Pain in left shoulder: Secondary | ICD-10-CM | POA: Insufficient documentation

## 2024-05-02 DIAGNOSIS — R600 Localized edema: Secondary | ICD-10-CM | POA: Insufficient documentation

## 2024-05-02 DIAGNOSIS — M6281 Muscle weakness (generalized): Secondary | ICD-10-CM | POA: Insufficient documentation

## 2024-05-03 ENCOUNTER — Encounter: Payer: Self-pay | Admitting: Physical Therapy

## 2024-05-03 ENCOUNTER — Ambulatory Visit: Attending: Orthopaedic Surgery | Admitting: Physical Therapy

## 2024-05-03 DIAGNOSIS — M6281 Muscle weakness (generalized): Secondary | ICD-10-CM

## 2024-05-03 DIAGNOSIS — R600 Localized edema: Secondary | ICD-10-CM

## 2024-05-03 DIAGNOSIS — M25512 Pain in left shoulder: Secondary | ICD-10-CM

## 2024-05-03 NOTE — Therapy (Signed)
 OUTPATIENT PHYSICAL THERAPY TREATMENT   Patient Name: Gregory OZAKI [name] MRN: [id number] DOB:[birth year], 73 y.o., male Today's Date: 05/03/2024      END OF SESSION:  PT End of Session - 05/03/24 0932     Visit Number 19    Number of Visits 27    Date for Recertification  05/30/24    Authorization Type [facility]    Authorization - Visit Number 4    Authorization - Number of Visits 15    Progress Note Due on Visit 25    PT Start Time 0932    PT Stop Time 1011    PT Time Calculation (min) 39 min             Past Medical History:  Diagnosis Date   Allergy not sure   Arteriosclerosis of coronary artery 04/05/2024   Arthritis    Chest pain    a. Normal cors 2001. b. Neg stress test 2012, 05/2014.   Diabetes mellitus without complication (HCC)    GERD (gastroesophageal reflux disease)    Hyperlipidemia    Hypertension    Hypertensive response to exercise    Hypothyroidism    Obesity    Obstructive sleep apnea    Currently untreated    PONV (postoperative nausea and vomiting)    Sleep apnea    +cpap   Ulcer of left lower extremity (HCC) 12/16/2022   Past Surgical History:  Procedure Laterality Date   CARDIAC CATHETERIZATION     CERVICAL FUSION     HERNIA REPAIR     KNEE ARTHROSCOPY  1984   both knee   [facility]   POSTERIOR LUMBAR FUSION 2 WITH HARDWARE REMOVAL Left 01/04/2024   Procedure: ARTHROSCOPY, SHOULDER WITH DEBRIDEMENT;  Surgeon: Jerri Kay HERO, MD;  Location: [facility];  Service: Orthopedics;  Laterality: Left;  LEFT SHOULDER ARTHROSCOPY, EXTENSIVE DEBRIDEMENT, DISTAL CLAVICLE EXCISION, SUBACROMIAL DECOMPRESSION   SHOULDER ARTHROSCOPY WITH SUBACROMIAL DECOMPRESSION Right 09/13/2019   Procedure: RIGHT SHOULDER ARTHROSCOPY WITH EXTENSIVE DEBRIDEMENT, SUBACROMIAL DECOMPRESSION,;  Surgeon: Vernetta Lonni GRADE, MD;  Location: [facility];  Service: Orthopedics;  Laterality: Right;   SPINE SURGERY  1st  Sept 1977   2nd Feb 2011   Patient Active Problem List   Diagnosis Date Noted   Osteoarthritis 04/05/2024   Hypothyroidism 04/05/2024   Arteriosclerosis of coronary artery 04/05/2024   Acute right-sided low back pain without sciatica 04/05/2024   Arthritis of left acromioclavicular joint 01/04/2024   Diabetic retinopathy (HCC) 12/29/2023   On long term drug therapy 10/27/2023   Pure hypercholesterolemia 10/27/2023   Viral URI 06/28/2023   Splinter in skin 03/08/2023   COVID 02/02/2023   Bee sting reaction 01/11/2023   Ulcer of left lower extremity (HCC) 12/16/2022   Bilateral hand numbness 10/06/2022   Atypical pigmented lesion 09/29/2022   ED (erectile dysfunction) 09/29/2022   Fusion of spine of cervical region 09/14/2022   Type 2 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 10/30/2021   Exercise counseling 07/29/2021   Other specified counseling 07/29/2021   Primary open-angle glaucoma, bilateral, mild stage 07/29/2021   Diabetic neuropathy (HCC) 07/31/2020   Abdominal aortic aneurysm 07/31/2020   Insomnia 07/31/2020   GERD (gastroesophageal reflux disease) 01/09/2020   Impingement syndrome of left shoulder 08/02/2019   Interstitial pulmonary disease (HCC) 06/29/2018   Hypertension associated with diabetes (HCC) 07/13/2017   Benign essential tremor 07/13/2017  Kidney cyst, acquired 01/24/2017   Seborrheic keratoses 04/01/2016   Diverticulosis of colon without hemorrhage 02/26/2016   Anxiety state 10/02/2015   BPH (benign prostatic hyperplasia) 07/31/2015   Carpal tunnel syndrome, left upper limb 06/04/2015   Hypogonadotropic hypogonadism 03/06/2015   Vitamin D  deficiency 03/06/2015   History of pancreatitis 03/06/2015   Lumbar spondylosis 03/04/2015   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   Obstructive sleep apnea 05/15/2014    Hyperlipidemia 05/15/2014   Glaucoma suspect 10/21/2011    PCP: Alvia Bring, DO  REFERRING PROVIDER: Jerri Kay HERO, MD  REFERRING DIAG: [phone] (ICD-10-CM) - Chronic left shoulder pain M75.42 (ICD-10-CM) - Impingement syndrome of left shoulder  THERAPY DIAG:  Left shoulder pain, unspecified chronicity  Muscle weakness (generalized)  Localized edema  Rationale for Evaluation and Treatment: Rehabilitation  ONSET DATE: scope + DCE/SAD 01/04/24 L shoulder  SUBJECTIVE:                                                                                                                                                                                      SUBJECTIVE STATEMENT: 05/03/2024: shoulder was hollering at me this morning. 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 He does note that even with increased pain, he is recovering quicker than he used to. No other new updates.     EVAL: Pt endorses chronic history with shoulder injuries, very active in sports, athletics, and [employer]. Worsening over past year prior to surgery. States since surgery pain has been steadily improving, is having difficulty w/ self care tasks, household tasks. Not performing heavier tasks around yard or home. Reports a bit of swelling in shoulder as expected, and bruising that is improving. States surgical follow up went well, reports being advised sling use PRN for comfort and no lifting 5# or greater. States yesterday he did remove one of his steri strips and it had some mild bleeding initially but no issues since. Denies constitutional symptoms (fevers, chills, SOB, etc). Has a bit of tingling in L lateral palm which he states is chronic, unchanged since surgery. Hand dominance: Right  PERTINENT HISTORY: DM, hx chest pain, cervical fusion, lumbar fusion (pt denies), HTN, hypothyroidism, LLE ulcer, R shoulder scope  2021, DM2, essential tremor, diabetic retinopathy Self reports aortic aneurysm, describes as stable and follows with cardiology  PAIN:  Are you having pain: 4/10 at present, 0-6/10 in past week (updated 05/02/24)    Per eval:  Location/description: pinching in front of shoulder, posterior shoulder Best-worst over past week: 5-11.9/10  - aggravating factors: difficulty positioning  while sitting, use of arm, picking up pots/pans, opening jar - Easing factors: sling use, icing, posiitoning    PRECAUTIONS: s/p shoulder scope, SAD, DCE (no formal restrictions on referral).   RED FLAGS: No fevers/chills, no SOB or other constitutional symptoms  WEIGHT BEARING RESTRICTIONS: No  FALLS:  Has patient fallen in last 6 months? 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  DIAGNOSTIC FINDINGS:  S/p L shoulder debridement, SAD, DCE 01/04/24  PATIENT SURVEYS:  QuickDASH: 70.45%   02/21/24 quickDASH: 47.7%   04/18/24 quickdash: 25%   COGNITION: Overall cognitive status: Within functional limits for tasks assessed     SENSATION: Numbness L hand, chronic and stable, unchanging since surgery per pt report  EDEMA/INSPECTION:  Incisions well appearing without drainage or erythema, steri strips over posterior and lateral incisions, anterior does not have steri strip but no apparent opening of incision ; gross edema apparent about L GH joint WNL; mild bruising upper arm which pt states is improving since surgery  UPPER EXTREMITY ROM:  A/PROM Right eval  Left eval Left 01/24/24 Left 01/31/24 L 02/07/24 L 02/09/24 L 02/16/24 L 02/21/24 L 02/28/24 L 03/06/24 L 03/13/24   L 04/18/24    Shoulder flexion A: 128 deg 91 deg passively A: 99 deg, mild pain on way down A: 120 deg  A: 125 deg AA: 134 deg  A: 135 deg *  A: 138 deg *   A: 131 deg*  AA: 141 deg A: 142 deg *  Shoulder abduction       A: 98 deg pain eccentrically A: 99 deg *    A: 110 deg * A: 135 deg *  Shoulder internal rotation         Functional combo; upper glute Functional combo: belt line Functional combo: iliac crest FC: iliac crest  Shoulder external rotation         Functional combo; occiput Functional combo: C4 Functional combo: C4 FC: C4  Elbow flexion              Elbow extension              Wrist flexion              Wrist extension               (Blank rows = not tested) (Key: WFL = within functional limits not formally assessed, * = concordant pain, s = stiffness/stretching sensation, NT = not tested)  Comments:    UPPER EXTREMITY MMT:  MMT Right eval Left eval 01/31/24 R / L 03/13/24 R/L  04/17/24 R/L   Shoulder flexion    5/4- 5/4 (mild pain)   Shoulder extension    5/4- * 5/4 (mild pain)  Shoulder abduction       Shoulder extension       Shoulder internal rotation       Shoulder external rotation       Elbow flexion       Elbow extension       Grip strength 55# 65#  55# / 80# 70# / 60#   (Blank rows = not tested)  (Key: WFL = within functional limits not formally assessed, * = concordant pain, s = stiffness/stretching sensation, NT = not tested)  Comments: MMT deferred given acuity of surgery   PALPATION:  TTP L UT, LS, rhomboid 04/18/24: globally tender L shoulder anteriorly, trigger points L LS/UT                                                                                                                              [facility] Adult PT Treatment:                                                DATE: 05/03/24 Therapeutic Exercise: Swiss ball horizontal abd/add AAROM  2x15 Swiss ball flexion up wall + gentle end range 2x10  Lateral 3# raise <60 deg 2x8 LUE superset w/ blue band row as below HEP update + education/handout   Neuromuscular re-ed: Blue band row 2x10 cues for periscapular activation and posture  Red band serratus press LUE 2x8 cues for scapular mechanics  Red band 45 deg row x10 cues for posture and comfortable ROM  Self Care: Education/discussion re: activity modification and load management, emphasizing mobility vs strength depending on overall activity levels and symptom behavior   [facility] Adult PT Treatment:                                                DATE: 05/02/24 Therapeutic Exercise: Swiss ball flexion up wall 2x12  Seated swiss ball horizontal abd 2x8  Unweighted OH press 2x10  HEP discussion/education  Neuromuscular re-ed: Swiss ball closed chain mini circles x10 CW/CCW in 90 deg flexion Swiss ball shoulder elevation (squeezing ball) 2x10 cues for posture  Self Care: Education re: pacing of tasks, activity modification, symptom behavior and possible contributing factors, managing fatigue/workload   [facility] Adult PT Treatment:                                                DATE: 04/24/24 Therapeutic Exercise: Swiss ball flexion up wall x10  Swiss ball flexion up wall + gentle push x10  Seated BIL shoulder flexion to 90 deg (5# DB using both hands) 2x10 YB seated OH press 3x5 cues for setup  HEP update + education/handout, discussed modification PRN and ensuring appropriate rest days with shoulder strengthening  Neuromuscular re-ed: Swiss ball closed chain mini circles x10 CW/CCW in 90 deg flexion Swiss ball abduction iso in neutral 2x10 cues for positioning and comfortable force output  RB wall clock 2x5 each UE cues for setup and apporpriate tension    [facility] Adult PT Treatment:                                                DATE: 04/17/24 Therapeutic Exercise: 6# bicep curl 2x5 neutral and supinated superset  Fwd  chest press iso hold 3#; x45sec  HEP update + education/handout  Neuromuscular re-ed: YB at wrist, BIL scaption x12  Wall clock x5 each UE unresisted; YB x6 BIL cues for form and comfortable ROM  Therapeutic Activity: MSK assessment + education Education/discussion re: progress with PT, symptom behavior as it affects activity tolerance, PT goals/POC     PATIENT EDUCATION: Education details: rationale for interventions, HEP  Person educated: Patient Education method: Explanation, Demonstration, Tactile cues, Verbal cues Education comprehension: verbalized understanding, returned demonstration, verbal cues required, tactile cues required, and needs further education       HOME EXERCISE PROGRAM: Access Code: [email or web address] URL: [email or web address] Date: 05/03/2024 Prepared by: Alm Jenny  Exercises - Standing Shoulder Row with Anchored Resistance  - 1-2 x daily - 7 x weekly - 1-2 sets - 10 reps - 3 sec hold - Shoulder extension with resistance - Neutral  - 1-2 x daily - 7 x weekly - 1-2 sets - 10 reps - Wall Clock with Theraband  - 3-4 x weekly - 2-3 sets - 8 reps - Shoulder External Rotation and Scapular Retraction with Resistance  - 3-4 x weekly - 1-2 sets - 10 reps - 3 sec hold - Standing Shoulder Flexion to 90 Degrees  - 3-4 x weekly - 2-3 sets - 6-8 reps - Seated Overhead Press  - 3-4 x weekly - 2-3 sets - 5 reps - Seated Single Arm Serratus Press with Anchored Resistance  - 3-4 x weekly - 2-3 sets - 6-10 reps  ASSESSMENT:  CLINICAL IMPRESSION:  05/03/2024: Pt arrives with 4/10 pain - no issues after last session, continues to endorse soreness/fatigue with tasks outside of PT (see subjective) but states he feels he is recovering more quickly. Today beginning session with mobility work to mitigate soreness/fatigue which he does well. We are able to progress periscapular stability and GH strengthening with limited volume, mild irritation in posterior/superior shoulder  intermittently but resolves w/ rest. Does well with addition of serratus activation exercises which he describes as relieving. No adverse events, reports no increase in pain on departure. Recommend continuing along current POC in order to address relevant deficits and improve functional tolerance. Pt departs today's session in no acute distress, all voiced questions/concerns addressed appropriately from PT perspective.     EVAL: Patient is a 73 y.o. gentleman who was seen today for physical therapy evaluation and treatment for L shoulder s/p debridement, SAD, DCE 01/04/24. No red flags or signs of post op complications today, did encourage monitoring of his incision as he states he removed anterior steri strip yesterday. Endorses difficulty with household tasks and limiting heavier tasks as expected post op. On exam he demonstrates limitations in passive mobility secondary to transient pain. Tolerates exam/HEP well overall without adverse event, HEP and education as above. Recommend trial of skilled PT to address aforementioned deficits with aim of improving functional tolerance and reducing pain with typical activities. Pt departs today's session in no acute distress, all voiced concerns/questions addressed appropriately from PT perspective.  OBJECTIVE IMPAIRMENTS: decreased activity tolerance, decreased endurance, decreased mobility, decreased ROM, decreased strength, impaired perceived functional ability, impaired UE functional use, improper body mechanics, postural dysfunction, and pain.   ACTIVITY LIMITATIONS: carrying, lifting, dressing, reach over head, and hygiene/grooming  PARTICIPATION LIMITATIONS: meal prep, cleaning, laundry, driving, shopping, and community activity  PERSONAL FACTORS: Age, Time since onset of injury/illness/exacerbation, and 3+ comorbidities: hx chest pain, cervical fusion, lumbarfusion, HTN, hypothyroidism, LLE ulcer, R shoulder scope 2021, DM2, essential tremor, diabetic  retinopathy are also affecting patient's functional outcome.   REHAB POTENTIAL: Good  CLINICAL DECISION MAKING: Stable/uncomplicated  EVALUATION COMPLEXITY: Low   GOALS:  SHORT TERM GOALS: Target date: 02/14/2024  Pt will demonstrate appropriate understanding and performance of initially prescribed HEP in order to facilitate improved independence with management of symptoms.  Baseline: HEP established  02/14/24: reports good HEP adherence  Goal status: MET  2. Pt will report at least 25% improvement in overall pain levels over past week in order to facilitate improved tolerance to typical daily activities.   Baseline: 5-11.910 02/21/24: 0-8/10 03/13/24: 4-6/10 04/18/24: 0-6/10 Goal status: MET   LONG TERM GOALS: Target date: 05/30/2024  (Updated 04/18/24)   Pt will score less than or equal to 50% on Quick DASH in order to indicate reduced levels of disability due to shoulder pain (MDC 16-20pts).  Baseline: 70.45%   02/21/24: 47.7%  Goal status: MET  2.  Pt will demonstrate at least 130 degrees of active L shoulder elevation in order to demonstrate improved tolerance to functional movement patterns such as reaching overhead.  Baseline: see ROM chart above 02/21/24: see ROM chart above (flexion >130 deg, abduction <100) 03/13/24: see ROM chart above 04/18/24: see ROM chart above Goal status: MET  3.  Pt will demonstrate at least 4+/5 shoulder flex/abd MMT for improved symmetry of UE strength and improved tolerance to functional movements.  Baseline: deferred on eval given acuity of surgery 02/21/24: deferred given proximity to surgery 03/13/24: see MMT chart above 04/18/24: see MMT chart above Goal status: PROGRESSING  4. Pt will report at least 50% decrease in overall pain levels in past week in order to facilitate improved tolerance to basic ADLs/mobility.   Baseline: 5-11.9/10 02/21/24: 0-8/10 03/13/24: 4-6/10 04/18/24: 0-6/10 Goal status: PROGRESSING  5. Pt will  endorse ability to mow lawn with less than 3 pt increase in pain in order to facilitate improved tolerance to usual tasks.  Baseline: unable to perform  02/21/24: not mowing lawn yet  03/13/24: was able to mow the lawn but did increase pain  04/18/24: reports some increase in pain with yardwork  Goal status: ONGOING  6. Pt will endorse ability to perform usual self-care ADLs such as dressing/hygiene in order to facilitate improved return to PLOF.  Baseline: difficulty w/ upper body dressing, putting on deodorant  02/21/24: still having inc effort with deodorant, difficulty removing pullover shirts  03/13/24: improved tolerance w/ upper body dressing, difficulty w/ deodorant  04/18/24: difficulty with jackets, mild pain/difficulty with deodorant but reports improvement  Goal status: PROGRESSING  PLAN: (updated 04/18/24)  PT FREQUENCY: 1-2x/week  PT DURATION: 6 weeks  PLANNED INTERVENTIONS: 97164- PT Re-evaluation, 97750- Physical Performance Testing, 97110-Therapeutic exercises, 97530- Therapeutic activity, 97112- Neuromuscular re-education, 97535- Self Care, [id number]- Manual therapy, 97016- Vasopneumatic device, 79439 (1-2 muscles), 20561 (3+ muscles)- Dry Needling, Patient/Family education, Taping, Joint mobilization, Spinal mobilization, Scar mobilization, Cryotherapy, and Moist heat  PLAN FOR NEXT SESSION: continue working on active shoulder mobility, light GH strengthening (non-consecutive days) and periscapular  strength/stability   Alm DELENA Jenny PT, DPT 05/03/2024 10:15 AM

## 2024-05-04 ENCOUNTER — Other Ambulatory Visit: Payer: Self-pay | Admitting: Family Medicine

## 2024-05-08 ENCOUNTER — Ambulatory Visit: Admitting: Physical Therapy

## 2024-05-08 ENCOUNTER — Encounter: Payer: Self-pay | Admitting: Physical Therapy

## 2024-05-08 DIAGNOSIS — R600 Localized edema: Secondary | ICD-10-CM

## 2024-05-08 DIAGNOSIS — M25512 Pain in left shoulder: Secondary | ICD-10-CM | POA: Diagnosis not present

## 2024-05-08 DIAGNOSIS — M6281 Muscle weakness (generalized): Secondary | ICD-10-CM

## 2024-05-08 NOTE — Therapy (Signed)
 OUTPATIENT PHYSICAL THERAPY TREATMENT   Patient Name: Gregory West [name] MRN: [id number] DOB:[birth year], 73 y.o., male Today's Date: 05/08/2024      END OF SESSION:  PT End of Session - 05/08/24 0932     Visit Number 20    Number of Visits 27    Authorization Type [facility]    Authorization - Visit Number 5    Authorization - Number of Visits 15    Progress Note Due on Visit 25    PT Start Time 0933    PT Stop Time 1012    PT Time Calculation (min) 39 min              Past Medical History:  Diagnosis Date   Allergy not sure   Arteriosclerosis of coronary artery 04/05/2024   Arthritis    Chest pain    a. Normal cors 2001. b. Neg stress test 2012, 05/2014.   Diabetes mellitus without complication (HCC)    GERD (gastroesophageal reflux disease)    Hyperlipidemia    Hypertension    Hypertensive response to exercise    Hypothyroidism    Obesity    Obstructive sleep apnea    Currently untreated    PONV (postoperative nausea and vomiting)    Sleep apnea    +cpap   Ulcer of left lower extremity (HCC) 12/16/2022   Past Surgical History:  Procedure Laterality Date   CARDIAC CATHETERIZATION     CERVICAL FUSION     HERNIA REPAIR     KNEE ARTHROSCOPY  1984   both knee   [facility]   POSTERIOR LUMBAR FUSION 2 WITH HARDWARE REMOVAL Left 01/04/2024   Procedure: ARTHROSCOPY, SHOULDER WITH DEBRIDEMENT;  Surgeon: Jerri Kay HERO, MD;  Location: [facility];  Service: Orthopedics;  Laterality: Left;  LEFT SHOULDER ARTHROSCOPY, EXTENSIVE DEBRIDEMENT, DISTAL CLAVICLE EXCISION, SUBACROMIAL DECOMPRESSION   SHOULDER ARTHROSCOPY WITH SUBACROMIAL DECOMPRESSION Right 09/13/2019   Procedure: RIGHT SHOULDER ARTHROSCOPY WITH EXTENSIVE DEBRIDEMENT, SUBACROMIAL DECOMPRESSION,;  Surgeon: Vernetta Lonni GRADE, MD;  Location: [facility];  Service: Orthopedics;  Laterality: Right;   SPINE SURGERY  1st Sept 1977   2nd Feb 2011   Patient  Active Problem List   Diagnosis Date Noted   Osteoarthritis 04/05/2024   Hypothyroidism 04/05/2024   Arteriosclerosis of coronary artery 04/05/2024   Acute right-sided low back pain without sciatica 04/05/2024   Arthritis of left acromioclavicular joint 01/04/2024   Diabetic retinopathy (HCC) 12/29/2023   On long term drug therapy 10/27/2023   Pure hypercholesterolemia 10/27/2023   Viral URI 06/28/2023   Splinter in skin 03/08/2023   COVID 02/02/2023   Bee sting reaction 01/11/2023   Ulcer of left lower extremity (HCC) 12/16/2022   Bilateral hand numbness 10/06/2022   Atypical pigmented lesion 09/29/2022   ED (erectile dysfunction) 09/29/2022   Fusion of spine of cervical region 09/14/2022   Type 2 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 10/30/2021   Exercise counseling 07/29/2021   Other specified counseling 07/29/2021   Primary open-angle glaucoma, bilateral, mild stage 07/29/2021   Diabetic neuropathy (HCC) 07/31/2020   Abdominal aortic aneurysm 07/31/2020   Insomnia 07/31/2020   GERD (gastroesophageal reflux disease) 01/09/2020   Impingement syndrome of left shoulder 08/02/2019   Interstitial pulmonary disease (HCC) 06/29/2018   Hypertension associated with diabetes (HCC) 07/13/2017   Benign essential tremor 07/13/2017   Kidney cyst, acquired 01/24/2017  Seborrheic keratoses 04/01/2016   Diverticulosis of colon without hemorrhage 02/26/2016   Anxiety state 10/02/2015   BPH (benign prostatic hyperplasia) 07/31/2015   Carpal tunnel syndrome, left upper limb 06/04/2015   Hypogonadotropic hypogonadism 03/06/2015   Vitamin D  deficiency 03/06/2015   History of pancreatitis 03/06/2015   Lumbar spondylosis 03/04/2015   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   Obstructive sleep apnea 05/15/2014   Hyperlipidemia 05/15/2014   Glaucoma  suspect 10/21/2011    PCP: Alvia Bring, DO  REFERRING PROVIDER: Jerri Kay HERO, MD  REFERRING DIAG: [phone] (ICD-10-CM) - Chronic left shoulder pain M75.42 (ICD-10-CM) - Impingement syndrome of left shoulder  THERAPY DIAG:  Left shoulder pain, unspecified chronicity  Muscle weakness (generalized)  Localized edema  Rationale for Evaluation and Treatment: Rehabilitation  ONSET DATE: scope + DCE/SAD 01/04/24 L shoulder  SUBJECTIVE:                                                                                                                                                                                      SUBJECTIVE STATEMENT: 05/08/2024: sore from church activities over the weekend. Did okay after last session, felt a bit better remainder of day. 4/10 pain in shoulder at present. Feels like banded exercises are getting easier.   EVAL: Pt endorses chronic history with shoulder injuries, very active in sports, athletics, and [employer]. Worsening over past year prior to surgery. States since surgery pain has been steadily improving, is having difficulty w/ self care tasks, household tasks. Not performing heavier tasks around yard or home. Reports a bit of swelling in shoulder as expected, and bruising that is improving. States surgical follow up went well, reports being advised sling use PRN for comfort and no lifting 5# or greater. States yesterday he did remove one of his steri strips and it had some mild bleeding initially but no issues since. Denies constitutional symptoms (fevers, chills, SOB, etc). Has a bit of tingling in L lateral palm which he states is chronic, unchanged since surgery. Hand dominance: Right  PERTINENT HISTORY: DM, hx chest pain, cervical fusion, lumbar fusion (pt denies), HTN, hypothyroidism, LLE ulcer, R shoulder scope 2021, DM2, essential tremor, diabetic retinopathy Self reports aortic aneurysm, describes as stable and follows with cardiology  PAIN:   Are you having pain: 4/10 at present, 0-7/10 in past week (updated 05/08/24)    Per eval:  Location/description: pinching in front of shoulder, posterior shoulder Best-worst over past week: 5-11.9/10  - aggravating factors: difficulty positioning while sitting, use of arm, picking up pots/pans, opening jar - Easing factors: sling use, icing, posiitoning    PRECAUTIONS: s/p shoulder scope, SAD, DCE (no  formal restrictions on referral).   RED FLAGS: No fevers/chills, no SOB or other constitutional symptoms  WEIGHT BEARING RESTRICTIONS: No  FALLS:  Has patient fallen in last 6 months? 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  DIAGNOSTIC FINDINGS:  S/p L shoulder debridement, SAD, DCE 01/04/24  PATIENT SURVEYS:  QuickDASH: 70.45%   02/21/24 quickDASH: 47.7%   04/18/24 quickdash: 25%   COGNITION: Overall cognitive status: Within functional limits for tasks assessed     SENSATION: Numbness L hand, chronic and stable, unchanging since surgery per pt report  EDEMA/INSPECTION:  Incisions well appearing without drainage or erythema, steri strips over posterior and lateral incisions, anterior does not have steri strip but no apparent opening of incision ; gross edema apparent about L GH joint WNL; mild bruising upper arm which pt states is improving since surgery  UPPER EXTREMITY ROM:  A/PROM Right eval Left eval Left 01/24/24 Left 01/31/24 L 02/07/24 L 02/09/24 L 02/16/24 L 02/21/24 L 02/28/24 L 03/06/24 L 03/13/24   L 04/18/24    Shoulder flexion A:  128 deg 91 deg passively A: 99 deg, mild pain on way down A: 120 deg  A: 125 deg AA: 134 deg  A: 135 deg *  A: 138 deg *   A: 131 deg*  AA: 141 deg A: 142 deg *  Shoulder abduction       A: 98 deg pain eccentrically A: 99 deg *    A: 110 deg * A: 135 deg *  Shoulder internal rotation         Functional combo; upper glute Functional combo: belt line Functional combo: iliac crest FC: iliac crest  Shoulder external rotation         Functional combo; occiput Functional combo: C4 Functional combo: C4 FC: C4  Elbow flexion              Elbow extension              Wrist flexion              Wrist extension               (Blank rows = not tested) (Key: WFL = within functional limits not formally assessed, * = concordant pain, s = stiffness/stretching sensation, NT = not tested)  Comments:    UPPER EXTREMITY MMT:  MMT Right eval Left eval 01/31/24 R / L 03/13/24 R/L  04/17/24 R/L   Shoulder flexion    5/4- 5/4 (mild pain)   Shoulder extension    5/4- * 5/4 (mild pain)  Shoulder abduction       Shoulder extension       Shoulder internal rotation       Shoulder external rotation       Elbow flexion       Elbow extension       Grip strength 55# 65# 55# / 80# 70# / 60#   (Blank rows = not tested)  (Key: WFL = within functional limits not formally assessed, * = concordant pain,  s = stiffness/stretching sensation, NT = not tested)  Comments: MMT deferred given acuity of surgery   PALPATION:  TTP L UT, LS, rhomboid 04/18/24: globally tender L shoulder anteriorly, trigger points L LS/UT                                                                                                                               [facility] Adult PT Treatment:                                                DATE: 05/08/24 Therapeutic Exercise: Supine 3# serratus press x10, x8 unweighted  Supine 3# shoulder flexion LUE 2x10 cues for comfortable ROM  RB mid row x12, green band x10 cues for mechanics and reduced elbow  compensations Blue band shoulder ext + tricep pull x10 HEP discussion/education  Therapeutic Activity: 10# KB front carry 2 laps, 20# 2x1 laps 10# suitcase carry LUE 1 lap, 2 laps w/ slight abd     [facility] Adult PT Treatment:                                                DATE: 05/03/24 Therapeutic Exercise: Swiss ball horizontal abd/add AAROM 2x15 Swiss ball flexion up wall + gentle end range 2x10  Lateral 3# raise <60 deg 2x8 LUE superset w/ blue band row as below HEP update + education/handout   Neuromuscular re-ed: Blue band row 2x10 cues for periscapular activation and posture  Red band serratus press LUE 2x8 cues for scapular mechanics  Red band 45 deg row x10 cues for posture and comfortable ROM  Self Care: Education/discussion re: activity modification and load management, emphasizing mobility vs strength depending on overall activity levels and symptom behavior   [facility] Adult PT Treatment:                                                DATE: 05/02/24 Therapeutic Exercise: Swiss ball flexion up wall 2x12  Seated swiss ball horizontal abd 2x8  Unweighted OH press 2x10  HEP discussion/education  Neuromuscular re-ed: Swiss ball closed chain mini circles x10 CW/CCW in 90 deg flexion Swiss ball shoulder elevation (squeezing ball) 2x10 cues for posture  Self Care: Education re: pacing of tasks, activity modification, symptom behavior and possible contributing factors, managing fatigue/workload   [facility] Adult PT Treatment:  DATE: 04/24/24 Therapeutic Exercise: Swiss ball flexion up wall x10  Swiss ball flexion up wall + gentle push x10  Seated BIL shoulder flexion to 90 deg (5# DB using both hands) 2x10 YB seated OH press 3x5 cues for setup  HEP update + education/handout, discussed modification PRN and ensuring appropriate rest days with shoulder strengthening  Neuromuscular re-ed: Swiss ball closed chain mini circles x10 CW/CCW  in 90 deg flexion Swiss ball abduction iso in neutral 2x10 cues for positioning and comfortable force output  RB wall clock 2x5 each UE cues for setup and apporpriate tension    [facility] Adult PT Treatment:                                                DATE: 04/17/24 Therapeutic Exercise: 6# bicep curl 2x5 neutral and supinated superset  Fwd chest press iso hold 3#; x45sec  HEP update + education/handout  Neuromuscular re-ed: YB at wrist, BIL scaption x12  Wall clock x5 each UE unresisted; YB x6 BIL cues for form and comfortable ROM  Therapeutic Activity: MSK assessment + education Education/discussion re: progress with PT, symptom behavior as it affects activity tolerance, PT goals/POC     PATIENT EDUCATION: Education details: rationale for interventions, HEP  Person educated: Patient Education method: Explanation, Demonstration, Tactile cues, Verbal cues Education comprehension: verbalized understanding, returned demonstration, verbal cues required, tactile cues required, and needs further education       HOME EXERCISE PROGRAM: Access Code: [email or web address] URL: [email or web address] Date: 05/03/2024 Prepared by: Alm Jenny  Exercises - Standing Shoulder Row with Anchored Resistance  - 1-2 x daily - 7 x weekly - 1-2 sets - 10 reps - 3 sec hold - Shoulder extension with resistance - Neutral  - 1-2 x daily - 7 x weekly - 1-2 sets - 10 reps - Wall Clock with Theraband  - 3-4 x weekly - 2-3 sets - 8 reps - Shoulder External Rotation and Scapular Retraction with Resistance  - 3-4 x weekly - 1-2 sets - 10 reps - 3 sec hold - Standing Shoulder Flexion to 90 Degrees  - 3-4 x weekly - 2-3 sets - 6-8 reps - Seated Overhead Press  - 3-4 x weekly - 2-3 sets - 5 reps - Seated Single Arm Serratus Press with Anchored Resistance  - 3-4 x weekly - 2-3 sets - 6-10 reps  ASSESSMENT:  CLINICAL IMPRESSION:  05/08/2024: Pt arrives w/ 4/10 pain, no issues after last session, reports  improving exercise tolerance but continues to have fluctuating pain based on activity levels. Today we progress functional strengthening and focal GH/periscapular strengthening with good tolerance. Most difficulty w/ serratus press in supine. He reports 4-5/10 pain throughout session but no reported increase in resting pain on departure, no adverse events. Recommend continuing along current POC in order to address relevant deficits and improve functional tolerance. Pt departs today's session in no acute distress, all voiced questions/concerns addressed appropriately from PT perspective.     EVAL: Patient is a 73 y.o. gentleman who was seen today for physical therapy evaluation and treatment for L shoulder s/p debridement, SAD, DCE 01/04/24. No red flags or signs of post op complications today, did encourage monitoring of his incision as he states he removed anterior steri strip yesterday. Endorses difficulty with household tasks and limiting heavier tasks as expected post op. On exam he demonstrates  limitations in passive mobility secondary to transient pain. Tolerates exam/HEP well overall without adverse event, HEP and education as above. Recommend trial of skilled PT to address aforementioned deficits with aim of improving functional tolerance and reducing pain with typical activities. Pt departs today's session in no acute distress, all voiced concerns/questions addressed appropriately from PT perspective.      OBJECTIVE IMPAIRMENTS: decreased activity tolerance, decreased endurance, decreased mobility, decreased ROM, decreased strength, impaired perceived functional ability, impaired UE functional use, improper body mechanics, postural dysfunction, and pain.   ACTIVITY LIMITATIONS: carrying, lifting, dressing, reach over head, and hygiene/grooming  PARTICIPATION LIMITATIONS: meal prep, cleaning, laundry, driving, shopping, and community activity  PERSONAL FACTORS: Age, Time since onset of  injury/illness/exacerbation, and 3+ comorbidities: hx chest pain, cervical fusion, lumbarfusion, HTN, hypothyroidism, LLE ulcer, R shoulder scope 2021, DM2, essential tremor, diabetic retinopathy are also affecting patient's functional outcome.   REHAB POTENTIAL: Good  CLINICAL DECISION MAKING: Stable/uncomplicated  EVALUATION COMPLEXITY: Low   GOALS:  SHORT TERM GOALS: Target date: 02/14/2024  Pt will demonstrate appropriate understanding and performance of initially prescribed HEP in order to facilitate improved independence with management of symptoms.  Baseline: HEP established  02/14/24: reports good HEP adherence  Goal status: MET  2. Pt will report at least 25% improvement in overall pain levels over past week in order to facilitate improved tolerance to typical daily activities.   Baseline: 5-11.910 02/21/24: 0-8/10 03/13/24: 4-6/10 04/18/24: 0-6/10 Goal status: MET   LONG TERM GOALS: Target date: 05/30/2024  (Updated 04/18/24)   Pt will score less than or equal to 50% on Quick DASH in order to indicate reduced levels of disability due to shoulder pain (MDC 16-20pts).  Baseline: 70.45%   02/21/24: 47.7%  Goal status: MET  2.  Pt will demonstrate at least 130 degrees of active L shoulder elevation in order to demonstrate improved tolerance to functional movement patterns such as reaching overhead.  Baseline: see ROM chart above 02/21/24: see ROM chart above (flexion >130 deg, abduction <100) 03/13/24: see ROM chart above 04/18/24: see ROM chart above Goal status: MET  3.  Pt will demonstrate at least 4+/5 shoulder flex/abd MMT for improved symmetry of UE strength and improved tolerance to functional movements.  Baseline: deferred on eval given acuity of surgery 02/21/24: deferred given proximity to surgery 03/13/24: see MMT chart above 04/18/24: see MMT chart above Goal status: PROGRESSING  4. Pt will report at least 50% decrease in overall pain levels in past week in  order to facilitate improved tolerance to basic ADLs/mobility.   Baseline: 5-11.9/10 02/21/24: 0-8/10 03/13/24: 4-6/10 04/18/24: 0-6/10 Goal status: PROGRESSING  5. Pt will endorse ability to mow lawn with less than 3 pt increase in pain in order to facilitate improved tolerance to usual tasks.  Baseline: unable to perform  02/21/24: not mowing lawn yet  03/13/24: was able to mow the lawn but did increase pain  04/18/24: reports some increase in pain with yardwork  Goal status: ONGOING  6. Pt will endorse ability to perform usual self-care ADLs such as dressing/hygiene in order to facilitate improved return to PLOF.  Baseline: difficulty w/ upper body dressing, putting on deodorant  02/21/24: still having inc effort with deodorant, difficulty removing pullover shirts  03/13/24: improved tolerance w/ upper body dressing, difficulty w/ deodorant  04/18/24: difficulty with jackets, mild pain/difficulty with deodorant but reports improvement  Goal status: PROGRESSING  PLAN: (updated 04/18/24)  PT FREQUENCY: 1-2x/week  PT DURATION: 6 weeks  PLANNED INTERVENTIONS: 02835-  PT Re-evaluation, 97750- Physical Performance Testing, 97110-Therapeutic exercises, 97530- Therapeutic activity, 97112- Neuromuscular re-education, 97535- Self Care, 02859- Manual therapy, 97016- Vasopneumatic device, [phone] (1-2 muscles), 20561 (3+ muscles)- Dry Needling, Patient/Family education, Taping, Joint mobilization, Spinal mobilization, Scar mobilization, Cryotherapy, and Moist heat  PLAN FOR NEXT SESSION: continue working on active shoulder mobility, light GH strengthening (non-consecutive days) and periscapular strength/stability   Alm DELENA Jenny PT, DPT 05/08/2024 10:20 AM

## 2024-05-10 ENCOUNTER — Ambulatory Visit

## 2024-05-11 ENCOUNTER — Other Ambulatory Visit: Payer: Self-pay | Admitting: Family Medicine

## 2024-05-11 DIAGNOSIS — N451 Epididymitis: Secondary | ICD-10-CM

## 2024-05-15 ENCOUNTER — Ambulatory Visit: Admitting: Physical Therapy

## 2024-05-15 NOTE — Therapy (Signed)
 OUTPATIENT PHYSICAL THERAPY TREATMENT   Patient Name: Gregory SHELDON [name] MRN: [id number] DOB:1951/02/19, 73 y.o., male Today's Date: 05/16/2024      END OF SESSION:  PT End of Session - 05/16/24 0935     Visit Number 21    Number of Visits 27    Date for Recertification  05/30/24    Authorization Type [facility]    Authorization - Visit Number 6    Authorization - Number of Visits 15    Progress Note Due on Visit 25    PT Start Time 0935    PT Stop Time 1014    PT Time Calculation (min) 39 min               Past Medical History:  Diagnosis Date   Allergy not sure   Arteriosclerosis of coronary artery 04/05/2024   Arthritis    Chest pain    a. Normal cors 2001. b. Neg stress test 2012, 05/2014.   Diabetes mellitus without complication (HCC)    GERD (gastroesophageal reflux disease)    Hyperlipidemia    Hypertension    Hypertensive response to exercise    Hypothyroidism    Obesity    Obstructive sleep apnea    Currently untreated    PONV (postoperative nausea and vomiting)    Sleep apnea    +cpap   Ulcer of left lower extremity (HCC) 12/16/2022   Past Surgical History:  Procedure Laterality Date   CARDIAC CATHETERIZATION     CERVICAL FUSION     HERNIA REPAIR     KNEE ARTHROSCOPY  1984   both knee   [facility]   POSTERIOR LUMBAR FUSION 2 WITH HARDWARE REMOVAL Left 01/04/2024   Procedure: ARTHROSCOPY, SHOULDER WITH DEBRIDEMENT;  Surgeon: Jerri Kay HERO, MD;  Location: [facility];  Service: Orthopedics;  Laterality: Left;  LEFT SHOULDER ARTHROSCOPY, EXTENSIVE DEBRIDEMENT, DISTAL CLAVICLE EXCISION, SUBACROMIAL DECOMPRESSION   SHOULDER ARTHROSCOPY WITH SUBACROMIAL DECOMPRESSION Right 09/13/2019   Procedure: RIGHT SHOULDER ARTHROSCOPY WITH EXTENSIVE DEBRIDEMENT, SUBACROMIAL DECOMPRESSION,;  Surgeon: Vernetta Lonni GRADE, MD;  Location: [facility];  Service: Orthopedics;  Laterality: Right;   SPINE SURGERY   1st Sept 1977   2nd Feb 2011   Patient Active Problem List   Diagnosis Date Noted   Osteoarthritis 04/05/2024   Hypothyroidism 04/05/2024   Arteriosclerosis of coronary artery 04/05/2024   Acute right-sided low back pain without sciatica 04/05/2024   Arthritis of left acromioclavicular joint 01/04/2024   Diabetic retinopathy (HCC) 12/29/2023   On long term drug therapy 10/27/2023   Pure hypercholesterolemia 10/27/2023   Viral URI 06/28/2023   Splinter in skin 03/08/2023   COVID 02/02/2023   Bee sting reaction 01/11/2023   Ulcer of left lower extremity (HCC) 12/16/2022   Bilateral hand numbness 10/06/2022   Atypical pigmented lesion 09/29/2022   ED (erectile dysfunction) 09/29/2022   Fusion of spine of cervical region 09/14/2022   Type 2 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 10/30/2021   Exercise counseling 07/29/2021   Other specified counseling 07/29/2021   Primary open-angle glaucoma, bilateral, mild stage 07/29/2021   Diabetic neuropathy (HCC) 07/31/2020   Abdominal aortic aneurysm 07/31/2020   Insomnia 07/31/2020   GERD (gastroesophageal reflux disease) 01/09/2020   Impingement syndrome of left shoulder 08/02/2019   Interstitial pulmonary disease (HCC) 06/29/2018   Hypertension associated with diabetes (HCC) 07/13/2017   Benign essential  tremor 07/13/2017   Kidney cyst, acquired 01/24/2017   Seborrheic keratoses 04/01/2016   Diverticulosis of colon without hemorrhage 02/26/2016   Anxiety state 10/02/2015   BPH (benign prostatic hyperplasia) 07/31/2015   Carpal tunnel syndrome, left upper limb 06/04/2015   Hypogonadotropic hypogonadism 03/06/2015   Vitamin D  deficiency 03/06/2015   History of pancreatitis 03/06/2015   Lumbar spondylosis 03/04/2015   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   Obstructive sleep apnea 05/15/2014    Hyperlipidemia 05/15/2014   Glaucoma suspect 10/21/2011    PCP: Alvia Bring, DO  REFERRING PROVIDER: Jerri Kay HERO, MD  REFERRING DIAG: [phone] (ICD-10-CM) - Chronic left shoulder pain M75.42 (ICD-10-CM) - Impingement syndrome of left shoulder  THERAPY DIAG:  Left shoulder pain, unspecified chronicity  Muscle weakness (generalized)  Localized edema  Rationale for Evaluation and Treatment: Rehabilitation  ONSET DATE: scope + DCE/SAD 01/04/24 L shoulder  SUBJECTIVE:                                                                                                                                                                                      SUBJECTIVE STATEMENT: 05/16/2024: got gel injections yesterday for his knees through the [place]. Back is bothering him a bit since this morning. Shoulder doing well, will still get some pain if he moves particular ways. Strength seems to be improving but remains limited. Quite sore the day after last session.    EVAL: Pt endorses chronic history with shoulder injuries, very active in sports, athletics, and [employer]. Worsening over past year prior to surgery. States since surgery pain has been steadily improving, is having difficulty w/ self care tasks, household tasks. Not performing heavier tasks around yard or home. Reports a bit of swelling in shoulder as expected, and bruising that is improving. States surgical follow up went well, reports being advised sling use PRN for comfort and no lifting 5# or greater. States yesterday he did remove one of his steri strips and it had some mild bleeding initially but no issues since. Denies constitutional symptoms (fevers, chills, SOB, etc). Has a bit of tingling in L lateral palm which he states is chronic, unchanged since surgery. Hand dominance: Right  PERTINENT HISTORY: DM, hx chest pain, cervical fusion, lumbar fusion (pt denies), HTN, hypothyroidism, LLE ulcer, R shoulder scope 2021, DM2,  essential tremor, diabetic retinopathy Self reports aortic aneurysm, describes as stable and follows with cardiology  PAIN:  Are you having pain:  3-4/10 at present, 0-7/10 in past week (updated 05/16/24)    Per eval:  Location/description: pinching in front of shoulder, posterior shoulder Best-worst over past week: 5-11.9/10  - aggravating factors: difficulty positioning  while sitting, use of arm, picking up pots/pans, opening jar - Easing factors: sling use, icing, posiitoning    PRECAUTIONS: s/p shoulder scope, SAD, DCE (no formal restrictions on referral).   RED FLAGS: No fevers/chills, no SOB or other constitutional symptoms  WEIGHT BEARING RESTRICTIONS: No  FALLS:  Has patient fallen in last 6 months? 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  DIAGNOSTIC FINDINGS:  S/p L shoulder debridement, SAD, DCE 01/04/24  PATIENT SURVEYS:  QuickDASH: 70.45%   02/21/24 quickDASH: 47.7%   04/18/24 quickdash: 25%   COGNITION: Overall cognitive status: Within functional limits for tasks assessed     SENSATION: Numbness L hand, chronic and stable, unchanging since surgery per pt report  EDEMA/INSPECTION:  Incisions well appearing without drainage or erythema, steri strips over posterior and lateral incisions, anterior does not have steri strip but no apparent opening of incision ; gross edema apparent about L GH joint WNL; mild bruising upper arm which pt states is improving since surgery  UPPER EXTREMITY ROM:  A/PROM Right eval Left eval  Left 01/24/24 Left 01/31/24 L 02/07/24 L 02/09/24 L 02/16/24 L 02/21/24 L 02/28/24 L 03/06/24 L 03/13/24   L 04/18/24    Shoulder flexion A: 128 deg 91 deg passively A: 99 deg, mild pain on way down A: 120 deg  A: 125 deg AA: 134 deg  A: 135 deg *  A: 138 deg *   A: 131 deg*  AA: 141 deg A: 142 deg *  Shoulder abduction       A: 98 deg pain eccentrically A: 99 deg *    A: 110 deg * A: 135 deg *  Shoulder internal rotation         Functional combo; upper glute Functional combo: belt line Functional combo: iliac crest FC: iliac crest  Shoulder external rotation         Functional combo; occiput Functional combo: C4 Functional combo: C4 FC: C4  Elbow flexion              Elbow extension              Wrist flexion              Wrist extension               (Blank rows = not tested) (Key: WFL = within functional limits not formally assessed, * = concordant pain, s = stiffness/stretching sensation, NT = not tested)  Comments:    UPPER EXTREMITY MMT:  MMT Right eval Left eval 01/31/24 R / L 03/13/24 R/L  04/17/24 R/L   Shoulder flexion    5/4- 5/4 (mild pain)   Shoulder extension    5/4- * 5/4 (mild pain)  Shoulder abduction       Shoulder extension       Shoulder internal rotation       Shoulder external rotation       Elbow flexion       Elbow extension       Grip strength 55# 65#  55# / 80# 70# / 60#   (Blank rows = not tested)  (Key: WFL = within functional limits not formally assessed, * = concordant pain, s = stiffness/stretching sensation, NT = not tested)  Comments: MMT deferred given acuity of surgery   PALPATION:  TTP L UT, LS, rhomboid 04/18/24: globally tender L shoulder anteriorly, trigger points L LS/UT                                                                                                                               [facility] Adult PT Treatment:                                                DATE: 05/16/24 Therapeutic Exercise: Supine 2# DB flexion x12 cues for  comfortable ROM  Supine serratus press unresisted x8, 2# x8 ; cues for scapular positioning and reduced elbow compensations YB horizontal abduction (band at wrist to accommodate L ring finger discomfort - hx trigger finger) Doorway rhomboid stretch 2x30sec cues for gentle stretch, comfortable ROM  HEP discussion/education + handout   Therapeutic Activity: 10# KB suitcase carry 2 laps BIL 10# front carry 2x2 laps  1# counter<>shelf (bottom shelf, about eye level) shuffle 2x30sec cues for pacing, 3# x30sec 1# counter<>top shelf shuffle x30sec     [facility] Adult PT Treatment:                                                DATE: 05/08/24 Therapeutic Exercise: Supine 3# serratus press x10, x8 unweighted  Supine 3# shoulder flexion LUE 2x10 cues for comfortable ROM  RB mid row x12, green band x10 cues for mechanics and reduced elbow compensations Blue band shoulder ext + tricep pull x10 HEP discussion/education  Therapeutic Activity: 10# KB front carry 2 laps, 20# 2x1 laps 10# suitcase carry LUE 1 lap, 2 laps w/ slight abd     [facility] Adult PT Treatment:                                                DATE: 05/03/24 Therapeutic Exercise: Swiss ball horizontal abd/add AAROM 2x15 Swiss ball flexion up wall + gentle end range 2x10  Lateral 3# raise <60 deg 2x8 LUE superset w/ blue band row as below HEP update + education/handout   Neuromuscular re-ed: Blue band row 2x10 cues for periscapular activation and posture  Red band serratus press LUE 2x8 cues for scapular mechanics  Red band 45 deg row x10 cues for posture and comfortable ROM  Self Care: Education/discussion re: activity  modification and load management, emphasizing mobility vs strength depending on overall activity levels and symptom behavior   [facility] Adult PT Treatment:                                                DATE: 05/02/24 Therapeutic Exercise: Swiss ball flexion up wall 2x12  Seated swiss ball horizontal abd 2x8   Unweighted OH press 2x10  HEP discussion/education  Neuromuscular re-ed: Swiss ball closed chain mini circles x10 CW/CCW in 90 deg flexion Swiss ball shoulder elevation (squeezing ball) 2x10 cues for posture  Self Care: Education re: pacing of tasks, activity modification, symptom behavior and possible contributing factors, managing fatigue/workload   [facility] Adult PT Treatment:                                                DATE: 04/24/24 Therapeutic Exercise: Swiss ball flexion up wall x10  Swiss ball flexion up wall + gentle push x10  Seated BIL shoulder flexion to 90 deg (5# DB using both hands) 2x10 YB seated OH press 3x5 cues for setup  HEP update + education/handout, discussed modification PRN and ensuring appropriate rest days with shoulder strengthening  Neuromuscular re-ed: Swiss ball closed chain mini circles x10 CW/CCW in 90 deg flexion Swiss ball abduction iso in neutral 2x10 cues for positioning and comfortable force output  RB wall clock 2x5 each UE cues for setup and apporpriate tension    PATIENT EDUCATION: Education details: rationale for interventions, HEP  Person educated: Patient Education method: Programmer, Multimedia, Demonstration, Actor cues, Verbal cues Education comprehension: verbalized understanding, returned demonstration, verbal cues required, tactile cues required, and needs further education       HOME EXERCISE PROGRAM: Access Code: [email or web address] URL: [email or web address] Date: 05/16/2024 Prepared by: Alm Jenny  Exercises - Standing Shoulder Row with Anchored Resistance  - 1-2 x daily - 7 x weekly - 1-2 sets - 10 reps - 3 sec hold - Shoulder extension with resistance - Neutral  - 1-2 x daily - 7 x weekly - 1-2 sets - 10 reps - Wall Clock with Theraband  - 3-4 x weekly - 2-3 sets - 8 reps - Shoulder External Rotation and Scapular Retraction with Resistance  - 3-4 x weekly - 1-2 sets - 10 reps - 3 sec hold - Standing Shoulder Flexion to  90 Degrees  - 3-4 x weekly - 2-3 sets - 6-8 reps - Seated Overhead Press  - 3-4 x weekly - 2-3 sets - 5 reps - Single Arm Serratus Punches in Supine with Dumbbell  - 3-4 x weekly - 2-3 sets - 8 reps - Standing Shoulder Horizontal Abduction with Resistance  - 3-4 x weekly - 2-3 sets - 10-12 reps  ASSESSMENT:  CLINICAL IMPRESSION:  05/16/2024: Pt arrives w/ 3-4/10 pain, soreness after last session. Today continuing to work on functional strengthening with carries and focal shoulder girdle strengthening. Tolerates relatively well - some muscular tension/fatigue as expected but no increase in resting pain, no adverse events. Cues as above. Remains painful/limited with shoulder abduction and reaching behind back. Recommend continuing along current POC in order to address relevant deficits and improve functional tolerance. Pt departs today's session in no acute distress, all voiced questions/concerns addressed appropriately  from PT perspective.      EVAL: Patient is a 73 y.o. gentleman who was seen today for physical therapy evaluation and treatment for L shoulder s/p debridement, SAD, DCE 01/04/24. No red flags or signs of post op complications today, did encourage monitoring of his incision as he states he removed anterior steri strip yesterday. Endorses difficulty with household tasks and limiting heavier tasks as expected post op. On exam he demonstrates limitations in passive mobility secondary to transient pain. Tolerates exam/HEP well overall without adverse event, HEP and education as above. Recommend trial of skilled PT to address aforementioned deficits with aim of improving functional tolerance and reducing pain with typical activities. Pt departs today's session in no acute distress, all voiced concerns/questions addressed appropriately from PT perspective.      OBJECTIVE IMPAIRMENTS: decreased activity tolerance, decreased endurance, decreased mobility, decreased ROM, decreased strength, impaired  perceived functional ability, impaired UE functional use, improper body mechanics, postural dysfunction, and pain.   ACTIVITY LIMITATIONS: carrying, lifting, dressing, reach over head, and hygiene/grooming  PARTICIPATION LIMITATIONS: meal prep, cleaning, laundry, driving, shopping, and community activity  PERSONAL FACTORS: Age, Time since onset of injury/illness/exacerbation, and 3+ comorbidities: hx chest pain, cervical fusion, lumbarfusion, HTN, hypothyroidism, LLE ulcer, R shoulder scope 2021, DM2, essential tremor, diabetic retinopathy are also affecting patient's functional outcome.   REHAB POTENTIAL: Good  CLINICAL DECISION MAKING: Stable/uncomplicated  EVALUATION COMPLEXITY: Low   GOALS:  SHORT TERM GOALS: Target date: 02/14/2024  Pt will demonstrate appropriate understanding and performance of initially prescribed HEP in order to facilitate improved independence with management of symptoms.  Baseline: HEP established  02/14/24: reports good HEP adherence  Goal status: MET  2. Pt will report at least 25% improvement in overall pain levels over past week in order to facilitate improved tolerance to typical daily activities.   Baseline: 5-11.910 02/21/24: 0-8/10 03/13/24: 4-6/10 04/18/24: 0-6/10 Goal status: MET   LONG TERM GOALS: Target date: 05/30/2024  (Updated 04/18/24)   Pt will score less than or equal to 50% on Quick DASH in order to indicate reduced levels of disability due to shoulder pain (MDC 16-20pts).  Baseline: 70.45%   02/21/24: 47.7%  Goal status: MET  2.  Pt will demonstrate at least 130 degrees of active L shoulder elevation in order to demonstrate improved tolerance to functional movement patterns such as reaching overhead.  Baseline: see ROM chart above 02/21/24: see ROM chart above (flexion >130 deg, abduction <100) 03/13/24: see ROM chart above 04/18/24: see ROM chart above Goal status: MET  3.  Pt will demonstrate at least 4+/5 shoulder flex/abd  MMT for improved symmetry of UE strength and improved tolerance to functional movements.  Baseline: deferred on eval given acuity of surgery 02/21/24: deferred given proximity to surgery 03/13/24: see MMT chart above 04/18/24: see MMT chart above Goal status: PROGRESSING  4. Pt will report at least 50% decrease in overall pain levels in past week in order to facilitate improved tolerance to basic ADLs/mobility.   Baseline: 5-11.9/10 02/21/24: 0-8/10 03/13/24: 4-6/10 04/18/24: 0-6/10 Goal status: PROGRESSING  5. Pt will endorse ability to mow lawn with less than 3 pt increase in pain in order to facilitate improved tolerance to usual tasks.  Baseline: unable to perform  02/21/24: not mowing lawn yet  03/13/24: was able to mow the lawn but did increase pain  04/18/24: reports some increase in pain with yardwork  Goal status: ONGOING  6. Pt will endorse ability to perform usual self-care ADLs such as  dressing/hygiene in order to facilitate improved return to PLOF.  Baseline: difficulty w/ upper body dressing, putting on deodorant  02/21/24: still having inc effort with deodorant, difficulty removing pullover shirts  03/13/24: improved tolerance w/ upper body dressing, difficulty w/ deodorant  04/18/24: difficulty with jackets, mild pain/difficulty with deodorant but reports improvement  Goal status: PROGRESSING  PLAN: (updated 04/18/24)  PT FREQUENCY: 1-2x/week  PT DURATION: 6 weeks  PLANNED INTERVENTIONS: 97164- PT Re-evaluation, 97750- Physical Performance Testing, 97110-Therapeutic exercises, 97530- Therapeutic activity, 97112- Neuromuscular re-education, 97535- Self Care, [id number]- Manual therapy, 97016- Vasopneumatic device, 79439 (1-2 muscles), 20561 (3+ muscles)- Dry Needling, Patient/Family education, Taping, Joint mobilization, Spinal mobilization, Scar mobilization, Cryotherapy, and Moist heat  PLAN FOR NEXT SESSION: continue working on active shoulder mobility, light GH  strengthening (non-consecutive days) and periscapular strength/stability   Alm DELENA Jenny PT, DPT 05/16/2024 10:17 AM

## 2024-05-16 ENCOUNTER — Ambulatory Visit: Admitting: Physical Therapy

## 2024-05-16 ENCOUNTER — Encounter: Payer: Self-pay | Admitting: Physical Therapy

## 2024-05-16 DIAGNOSIS — R600 Localized edema: Secondary | ICD-10-CM

## 2024-05-16 DIAGNOSIS — M6281 Muscle weakness (generalized): Secondary | ICD-10-CM

## 2024-05-16 DIAGNOSIS — M25512 Pain in left shoulder: Secondary | ICD-10-CM | POA: Diagnosis not present

## 2024-05-16 NOTE — Therapy (Signed)
 OUTPATIENT PHYSICAL THERAPY TREATMENT   Patient Name: Gregory West. MRN: [id number] DOB:08/17/1950, 73 y.o., male Today's Date: 05/17/2024      END OF SESSION:  PT End of Session - 05/17/24 0931     Visit Number 22    Number of Visits 27    Date for Recertification  05/30/24    Authorization Type [facility]    Authorization - Visit Number 7    Authorization - Number of Visits 15    Progress Note Due on Visit 25    PT Start Time 0932    PT Stop Time 1010    PT Time Calculation (min) 38 min                Past Medical History:  Diagnosis Date   Allergy not sure   Arteriosclerosis of coronary artery 04/05/2024   Arthritis    Chest pain    a. Normal cors 2001. b. Neg stress test 2012, 05/2014.   Diabetes mellitus without complication (HCC)    GERD (gastroesophageal reflux disease)    Hyperlipidemia    Hypertension    Hypertensive response to exercise    Hypothyroidism    Obesity    Obstructive sleep apnea    Currently untreated    PONV (postoperative nausea and vomiting)    Sleep apnea    +cpap   Ulcer of left lower extremity (HCC) 12/16/2022   Past Surgical History:  Procedure Laterality Date   CARDIAC CATHETERIZATION     CERVICAL FUSION     HERNIA REPAIR     KNEE ARTHROSCOPY  1984   both knee   [facility]   POSTERIOR LUMBAR FUSION 2 WITH HARDWARE REMOVAL Left 01/04/2024   Procedure: ARTHROSCOPY, SHOULDER WITH DEBRIDEMENT;  Surgeon: Jerri Kay HERO, MD;  Location: [facility];  Service: Orthopedics;  Laterality: Left;  LEFT SHOULDER ARTHROSCOPY, EXTENSIVE DEBRIDEMENT, DISTAL CLAVICLE EXCISION, SUBACROMIAL DECOMPRESSION   SHOULDER ARTHROSCOPY WITH SUBACROMIAL DECOMPRESSION Right 09/13/2019   Procedure: RIGHT SHOULDER ARTHROSCOPY WITH EXTENSIVE DEBRIDEMENT, SUBACROMIAL DECOMPRESSION,;  Surgeon: Vernetta Lonni GRADE, MD;  Location: [facility];  Service: Orthopedics;  Laterality: Right;   SPINE SURGERY   1st Sept 1977   2nd Feb 2011   Patient Active Problem List   Diagnosis Date Noted   Osteoarthritis 04/05/2024   Hypothyroidism 04/05/2024   Arteriosclerosis of coronary artery 04/05/2024   Acute right-sided low back pain without sciatica 04/05/2024   Arthritis of left acromioclavicular joint 01/04/2024   Diabetic retinopathy (HCC) 12/29/2023   On long term drug therapy 10/27/2023   Pure hypercholesterolemia 10/27/2023   Viral URI 06/28/2023   Splinter in skin 03/08/2023   COVID 02/02/2023   Bee sting reaction 01/11/2023   Ulcer of left lower extremity (HCC) 12/16/2022   Bilateral hand numbness 10/06/2022   Atypical pigmented lesion 09/29/2022   ED (erectile dysfunction) 09/29/2022   Fusion of spine of cervical region 09/14/2022   Type 2 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 10/30/2021   Exercise counseling 07/29/2021   Other specified counseling 07/29/2021   Primary open-angle glaucoma, bilateral, mild stage 07/29/2021   Diabetic neuropathy (HCC) 07/31/2020   Abdominal aortic aneurysm 07/31/2020   Insomnia 07/31/2020   GERD (gastroesophageal reflux disease) 01/09/2020   Impingement syndrome of left shoulder 08/02/2019   Interstitial pulmonary disease (HCC) 06/29/2018   Hypertension associated with diabetes (HCC) 07/13/2017   Benign  essential tremor 07/13/2017   Kidney cyst, acquired 01/24/2017   Seborrheic keratoses 04/01/2016   Diverticulosis of colon without hemorrhage 02/26/2016   Anxiety state 10/02/2015   BPH (benign prostatic hyperplasia) 07/31/2015   Carpal tunnel syndrome, left upper limb 06/04/2015   Hypogonadotropic hypogonadism 03/06/2015   Vitamin D  deficiency 03/06/2015   History of pancreatitis 03/06/2015   Lumbar spondylosis 03/04/2015   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   Obstructive sleep apnea  05/15/2014   Hyperlipidemia 05/15/2014   Glaucoma suspect 10/21/2011    PCP: Alvia Bring, DO  REFERRING PROVIDER: Jerri Kay HERO, MD  REFERRING DIAG: [phone] (ICD-10-CM) - Chronic left shoulder pain M75.42 (ICD-10-CM) - Impingement syndrome of left shoulder  THERAPY DIAG:  Left shoulder pain, unspecified chronicity  Muscle weakness (generalized)  Localized edema  Rationale for Evaluation and Treatment: Rehabilitation  ONSET DATE: scope + DCE/SAD 01/04/24 L shoulder  SUBJECTIVE:                                                                                                                                                                                      SUBJECTIVE STATEMENT: 05/17/2024: shoulder's fine. Bothered him a little yesterday afternoon. Reports 4/10 at present. Knees bothering him a bit too.     EVAL: Pt endorses chronic history with shoulder injuries, very active in sports, athletics, and [employer]. Worsening over past year prior to surgery. States since surgery pain has been steadily improving, is having difficulty w/ self care tasks, household tasks. Not performing heavier tasks around yard or home. Reports a bit of swelling in shoulder as expected, and bruising that is improving. States surgical follow up went well, reports being advised sling use PRN for comfort and no lifting 5# or greater. States yesterday he did remove one of his steri strips and it had some mild bleeding initially but no issues since. Denies constitutional symptoms (fevers, chills, SOB, etc). Has a bit of tingling in L lateral palm which he states is chronic, unchanged since surgery. Hand dominance: Right  PERTINENT HISTORY: DM, hx chest pain, cervical fusion, lumbar fusion (pt denies), HTN, hypothyroidism, LLE ulcer, R shoulder scope 2021, DM2, essential tremor, diabetic retinopathy Self reports aortic aneurysm, describes as stable and follows with cardiology  PAIN:  Are you having  pain: 4/10 at present, 0-7/10 in past week (updated 05/16/24)    Per eval:  Location/description: pinching in front of shoulder, posterior shoulder Best-worst over past week: 5-11.9/10  - aggravating factors: difficulty positioning while sitting, use of arm, picking up pots/pans, opening jar - Easing factors: sling use, icing, posiitoning    PRECAUTIONS: s/p shoulder scope, SAD, DCE (no formal  restrictions on referral).   RED FLAGS: No fevers/chills, no SOB or other constitutional symptoms  WEIGHT BEARING RESTRICTIONS: No  FALLS:  Has patient fallen in last 6 months? 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  DIAGNOSTIC FINDINGS:  S/p L shoulder debridement, SAD, DCE 01/04/24  PATIENT SURVEYS:  QuickDASH: 70.45%   02/21/24 quickDASH: 47.7%   04/18/24 quickdash: 25%   COGNITION: Overall cognitive status: Within functional limits for tasks assessed     SENSATION: Numbness L hand, chronic and stable, unchanging since surgery per pt report  EDEMA/INSPECTION:  Incisions well appearing without drainage or erythema, steri strips over posterior and lateral incisions, anterior does not have steri strip but no apparent opening of incision ; gross edema apparent about L GH joint WNL; mild bruising upper arm which pt states is improving since surgery  UPPER EXTREMITY ROM:  A/PROM Right eval Left eval Left 01/24/24 Left 01/31/24 L 02/07/24 L 02/09/24 L 02/16/24 L 02/21/24 L 02/28/24 L 03/06/24 L 03/13/24   L 04/18/24    Shoulder flexion A: 128 deg 91 deg  passively A: 99 deg, mild pain on way down A: 120 deg  A: 125 deg AA: 134 deg  A: 135 deg *  A: 138 deg *   A: 131 deg*  AA: 141 deg A: 142 deg *  Shoulder abduction       A: 98 deg pain eccentrically A: 99 deg *    A: 110 deg * A: 135 deg *  Shoulder internal rotation         Functional combo; upper glute Functional combo: belt line Functional combo: iliac crest FC: iliac crest  Shoulder external rotation         Functional combo; occiput Functional combo: C4 Functional combo: C4 FC: C4  Elbow flexion              Elbow extension              Wrist flexion              Wrist extension               (Blank rows = not tested) (Key: WFL = within functional limits not formally assessed, * = concordant pain, s = stiffness/stretching sensation, NT = not tested)  Comments:    UPPER EXTREMITY MMT:  MMT Right eval Left eval 01/31/24 R / L 03/13/24 R/L  04/17/24 R/L   Shoulder flexion    5/4- 5/4 (mild pain)   Shoulder extension    5/4- * 5/4 (mild pain)  Shoulder abduction       Shoulder extension       Shoulder internal rotation       Shoulder external rotation       Elbow flexion       Elbow extension       Grip strength 55# 65# 55# / 80# 70# / 60#   (Blank rows = not tested)  (Key: WFL = within functional limits not formally assessed, * = concordant pain, s =  stiffness/stretching sensation, NT = not tested)  Comments: MMT deferred given acuity of surgery   PALPATION:  TTP L UT, LS, rhomboid 04/18/24: globally tender L shoulder anteriorly, trigger points L LS/UT                                                                                                                               [facility] Adult PT Treatment:                                                DATE: 05/17/24 Therapeutic Exercise: Supine cane chest press + OH pullover 2x15 Sidelying LUE horizontal abd at 90 deg flex 2x8  cues for comfortable ROM Cane assisted scaption, standing 3x8 cues for appropriate angle Green  band tricep pull LUE 2x12 cues for elbow positioning HEP discussion/education  Neuromuscular re-ed: Standing serratus wall slides 3x8 cues for periscapular stability, tactile cues at serratus Small horizontal circles at ~110 deg LUE 2x8 cues for positioning, motor control     [facility] Adult PT Treatment:                                                DATE: 05/16/24 Therapeutic Exercise: Supine 2# DB flexion x12 cues for comfortable ROM  Supine serratus press unresisted x8, 2# x8 ; cues for scapular positioning and reduced elbow compensations YB horizontal abduction (band at wrist to accommodate L ring finger discomfort - hx trigger finger) Doorway rhomboid stretch 2x30sec cues for gentle stretch, comfortable ROM  HEP discussion/education + handout   Therapeutic Activity: 10# KB suitcase carry 2 laps BIL 10# front carry 2x2 laps  1# counter<>shelf (bottom shelf, about eye level) shuffle 2x30sec cues for pacing, 3# x30sec 1# counter<>top shelf shuffle x30sec     [facility] Adult PT Treatment:                                                DATE: 05/08/24 Therapeutic Exercise: Supine 3# serratus press x10, x8 unweighted  Supine 3# shoulder flexion LUE 2x10 cues for comfortable ROM  RB mid row x12, green band x10 cues for mechanics and reduced elbow compensations Blue band shoulder ext + tricep pull x10 HEP discussion/education  Therapeutic Activity: 10# KB front carry 2 laps, 20# 2x1 laps 10# suitcase carry LUE 1 lap, 2 laps w/ slight abd     [facility] Adult PT Treatment:  DATE: 05/03/24 Therapeutic Exercise: Swiss ball horizontal abd/add AAROM 2x15 Swiss ball flexion up wall + gentle end range 2x10  Lateral 3# raise <60 deg 2x8 LUE superset w/ blue band row as below HEP update + education/handout   Neuromuscular re-ed: Blue band row 2x10 cues for periscapular activation and posture  Red band serratus press LUE 2x8 cues for scapular mechanics   Red band 45 deg row x10 cues for posture and comfortable ROM  Self Care: Education/discussion re: activity modification and load management, emphasizing mobility vs strength depending on overall activity levels and symptom behavior   [facility] Adult PT Treatment:                                                DATE: 05/02/24 Therapeutic Exercise: Swiss ball flexion up wall 2x12  Seated swiss ball horizontal abd 2x8  Unweighted OH press 2x10  HEP discussion/education  Neuromuscular re-ed: Swiss ball closed chain mini circles x10 CW/CCW in 90 deg flexion Swiss ball shoulder elevation (squeezing ball) 2x10 cues for posture  Self Care: Education re: pacing of tasks, activity modification, symptom behavior and possible contributing factors, managing fatigue/workload    PATIENT EDUCATION: Education details: rationale for interventions, HEP  Person educated: Patient Education method: Explanation, Demonstration, Tactile cues, Verbal cues Education comprehension: verbalized understanding, returned demonstration, verbal cues required, tactile cues required, and needs further education       HOME EXERCISE PROGRAM: Access Code: [email or web address] URL: [email or web address] Date: 05/16/2024 Prepared by: Alm Jenny  Exercises - Standing Shoulder Row with Anchored Resistance  - 1-2 x daily - 7 x weekly - 1-2 sets - 10 reps - 3 sec hold - Shoulder extension with resistance - Neutral  - 1-2 x daily - 7 x weekly - 1-2 sets - 10 reps - Wall Clock with Theraband  - 3-4 x weekly - 2-3 sets - 8 reps - Shoulder External Rotation and Scapular Retraction with Resistance  - 3-4 x weekly - 1-2 sets - 10 reps - 3 sec hold - Standing Shoulder Flexion to 90 Degrees  - 3-4 x weekly - 2-3 sets - 6-8 reps - Seated Overhead Press  - 3-4 x weekly - 2-3 sets - 5 reps - Single Arm Serratus Punches in Supine with Dumbbell  - 3-4 x weekly - 2-3 sets - 8 reps - Standing Shoulder Horizontal Abduction with  Resistance  - 3-4 x weekly - 2-3 sets - 10-12 reps  ASSESSMENT:  CLINICAL IMPRESSION:  05/17/2024: Pt arrives w/ 4/10 pain in shoulder, mild inc in soreness after yesterdays session but better today. Given consecutive treatment days, today we focus primarily on active/AA mobility within comfortable ROM, periscapular activation and GH motor control. Tolerates well without increase in pain, no adverse events. Recommend continuing along current POC in order to address relevant deficits and improve functional tolerance. Pt departs today's session in no acute distress, all voiced questions/concerns addressed appropriately from PT perspective.     EVAL: Patient is a 73 y.o. gentleman who was seen today for physical therapy evaluation and treatment for L shoulder s/p debridement, SAD, DCE 01/04/24. No red flags or signs of post op complications today, did encourage monitoring of his incision as he states he removed anterior steri strip yesterday. Endorses difficulty with household tasks and limiting heavier tasks as expected post op. On exam he demonstrates limitations in passive  mobility secondary to transient pain. Tolerates exam/HEP well overall without adverse event, HEP and education as above. Recommend trial of skilled PT to address aforementioned deficits with aim of improving functional tolerance and reducing pain with typical activities. Pt departs today's session in no acute distress, all voiced concerns/questions addressed appropriately from PT perspective.      OBJECTIVE IMPAIRMENTS: decreased activity tolerance, decreased endurance, decreased mobility, decreased ROM, decreased strength, impaired perceived functional ability, impaired UE functional use, improper body mechanics, postural dysfunction, and pain.   ACTIVITY LIMITATIONS: carrying, lifting, dressing, reach over head, and hygiene/grooming  PARTICIPATION LIMITATIONS: meal prep, cleaning, laundry, driving, shopping, and community  activity  PERSONAL FACTORS: Age, Time since onset of injury/illness/exacerbation, and 3+ comorbidities: hx chest pain, cervical fusion, lumbarfusion, HTN, hypothyroidism, LLE ulcer, R shoulder scope 2021, DM2, essential tremor, diabetic retinopathy are also affecting patient's functional outcome.   REHAB POTENTIAL: Good  CLINICAL DECISION MAKING: Stable/uncomplicated  EVALUATION COMPLEXITY: Low   GOALS:  SHORT TERM GOALS: Target date: 02/14/2024  Pt will demonstrate appropriate understanding and performance of initially prescribed HEP in order to facilitate improved independence with management of symptoms.  Baseline: HEP established  02/14/24: reports good HEP adherence  Goal status: MET  2. Pt will report at least 25% improvement in overall pain levels over past week in order to facilitate improved tolerance to typical daily activities.   Baseline: 5-11.910 02/21/24: 0-8/10 03/13/24: 4-6/10 04/18/24: 0-6/10 Goal status: MET   LONG TERM GOALS: Target date: 05/30/2024  (Updated 04/18/24)   Pt will score less than or equal to 50% on Quick DASH in order to indicate reduced levels of disability due to shoulder pain (MDC 16-20pts).  Baseline: 70.45%   02/21/24: 47.7%  Goal status: MET  2.  Pt will demonstrate at least 130 degrees of active L shoulder elevation in order to demonstrate improved tolerance to functional movement patterns such as reaching overhead.  Baseline: see ROM chart above 02/21/24: see ROM chart above (flexion >130 deg, abduction <100) 03/13/24: see ROM chart above 04/18/24: see ROM chart above Goal status: MET  3.  Pt will demonstrate at least 4+/5 shoulder flex/abd MMT for improved symmetry of UE strength and improved tolerance to functional movements.  Baseline: deferred on eval given acuity of surgery 02/21/24: deferred given proximity to surgery 03/13/24: see MMT chart above 04/18/24: see MMT chart above Goal status: PROGRESSING  4. Pt will report at  least 50% decrease in overall pain levels in past week in order to facilitate improved tolerance to basic ADLs/mobility.   Baseline: 5-11.9/10 02/21/24: 0-8/10 03/13/24: 4-6/10 04/18/24: 0-6/10 Goal status: PROGRESSING  5. Pt will endorse ability to mow lawn with less than 3 pt increase in pain in order to facilitate improved tolerance to usual tasks.  Baseline: unable to perform  02/21/24: not mowing lawn yet  03/13/24: was able to mow the lawn but did increase pain  04/18/24: reports some increase in pain with yardwork  Goal status: ONGOING  6. Pt will endorse ability to perform usual self-care ADLs such as dressing/hygiene in order to facilitate improved return to PLOF.  Baseline: difficulty w/ upper body dressing, putting on deodorant  02/21/24: still having inc effort with deodorant, difficulty removing pullover shirts  03/13/24: improved tolerance w/ upper body dressing, difficulty w/ deodorant  04/18/24: difficulty with jackets, mild pain/difficulty with deodorant but reports improvement  Goal status: PROGRESSING  PLAN: (updated 04/18/24)  PT FREQUENCY: 1-2x/week  PT DURATION: 6 weeks  PLANNED INTERVENTIONS: 02835- PT Re-evaluation, 97750-  Physical Performance Testing, 97110-Therapeutic exercises, 97530- Therapeutic activity, [id number]- Neuromuscular re-education, 97535- Self Care, [id number]- Manual therapy, 97016- Vasopneumatic device, [phone] (1-2 muscles), 20561 (3+ muscles)- Dry Needling, Patient/Family education, Taping, Joint mobilization, Spinal mobilization, Scar mobilization, Cryotherapy, and Moist heat  PLAN FOR NEXT SESSION: continue working on active shoulder mobility, light GH strengthening (non-consecutive days) and periscapular strength/stability   Alm DELENA Jenny PT, DPT 05/17/2024 10:14 AM

## 2024-05-17 ENCOUNTER — Ambulatory Visit: Admitting: Physical Therapy

## 2024-05-17 ENCOUNTER — Encounter: Payer: Self-pay | Admitting: Physical Therapy

## 2024-05-17 DIAGNOSIS — R600 Localized edema: Secondary | ICD-10-CM

## 2024-05-17 DIAGNOSIS — M6281 Muscle weakness (generalized): Secondary | ICD-10-CM

## 2024-05-17 DIAGNOSIS — M25512 Pain in left shoulder: Secondary | ICD-10-CM | POA: Diagnosis not present

## 2024-05-21 NOTE — Therapy (Signed)
 " OUTPATIENT PHYSICAL THERAPY TREATMENT   Patient Name: Gregory GAUNT [name] MRN: [id number] DOB:March 13, 1951, 73 y.o., male Today's Date: 05/22/2024      END OF SESSION:  PT End of Session - 05/22/24 0928     Visit Number 23    Number of Visits 27    Date for Recertification  05/30/24    Authorization Type [facility]    Authorization - Visit Number 8    Authorization - Number of Visits 15    Progress Note Due on Visit 25    PT Start Time 0930    PT Stop Time 1011    PT Time Calculation (min) 41 min                 Past Medical History:  Diagnosis Date   Allergy not sure   Arteriosclerosis of coronary artery 04/05/2024   Arthritis    Chest pain    a. Normal cors 2001. b. Neg stress test 2012, 05/2014.   Diabetes mellitus without complication (HCC)    GERD (gastroesophageal reflux disease)    Hyperlipidemia    Hypertension    Hypertensive response to exercise    Hypothyroidism    Obesity    Obstructive sleep apnea    Currently untreated    PONV (postoperative nausea and vomiting)    Sleep apnea    +cpap   Ulcer of left lower extremity (HCC) 12/16/2022   Past Surgical History:  Procedure Laterality Date   CARDIAC CATHETERIZATION     CERVICAL FUSION     HERNIA REPAIR     KNEE ARTHROSCOPY  1984   both knee   [facility]   POSTERIOR LUMBAR FUSION 2 WITH HARDWARE REMOVAL Left 01/04/2024   Procedure: ARTHROSCOPY, SHOULDER WITH DEBRIDEMENT;  Surgeon: Jerri Kay HERO, MD;  Location: [facility];  Service: Orthopedics;  Laterality: Left;  LEFT SHOULDER ARTHROSCOPY, EXTENSIVE DEBRIDEMENT, DISTAL CLAVICLE EXCISION, SUBACROMIAL DECOMPRESSION   SHOULDER ARTHROSCOPY WITH SUBACROMIAL DECOMPRESSION Right 09/13/2019   Procedure: RIGHT SHOULDER ARTHROSCOPY WITH EXTENSIVE DEBRIDEMENT, SUBACROMIAL DECOMPRESSION,;  Surgeon: Vernetta Lonni GRADE, MD;  Location: [facility];  Service: Orthopedics;  Laterality: Right;   SPINE  SURGERY  1st Sept 1977   2nd Feb 2011   Patient Active Problem List   Diagnosis Date Noted   Osteoarthritis 04/05/2024   Hypothyroidism 04/05/2024   Arteriosclerosis of coronary artery 04/05/2024   Acute right-sided low back pain without sciatica 04/05/2024   Arthritis of left acromioclavicular joint 01/04/2024   Diabetic retinopathy (HCC) 12/29/2023   On long term drug therapy 10/27/2023   Pure hypercholesterolemia 10/27/2023   Viral URI 06/28/2023   Splinter in skin 03/08/2023   COVID 02/02/2023   Bee sting reaction 01/11/2023   Ulcer of left lower extremity (HCC) 12/16/2022   Bilateral hand numbness 10/06/2022   Atypical pigmented lesion 09/29/2022   ED (erectile dysfunction) 09/29/2022   Fusion of spine of cervical region 09/14/2022   Type 2 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 10/30/2021   Exercise counseling 07/29/2021   Other specified counseling 07/29/2021   Primary open-angle glaucoma, bilateral, mild stage 07/29/2021   Diabetic neuropathy (HCC) 07/31/2020   Abdominal aortic aneurysm 07/31/2020   Insomnia 07/31/2020   GERD (gastroesophageal reflux disease) 01/09/2020   Impingement syndrome of left shoulder 08/02/2019   Interstitial pulmonary disease (HCC) 06/29/2018   Hypertension associated with diabetes (HCC) 07/13/2017  Benign essential tremor 07/13/2017   Kidney cyst, acquired 01/24/2017   Seborrheic keratoses 04/01/2016   Diverticulosis of colon without hemorrhage 02/26/2016   Anxiety state 10/02/2015   BPH (benign prostatic hyperplasia) 07/31/2015   Carpal tunnel syndrome, left upper limb 06/04/2015   Hypogonadotropic hypogonadism 03/06/2015   Vitamin D  deficiency 03/06/2015   History of pancreatitis 03/06/2015   Lumbar spondylosis 03/04/2015   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   Obstructive sleep apnea  05/15/2014   Hyperlipidemia 05/15/2014   Glaucoma suspect 10/21/2011    PCP: Alvia Bring, DO  REFERRING PROVIDER: Jerri Kay HERO, MD  REFERRING DIAG: [phone] (ICD-10-CM) - Chronic left shoulder pain M75.42 (ICD-10-CM) - Impingement syndrome of left shoulder  THERAPY DIAG:  Left shoulder pain, unspecified chronicity  Muscle weakness (generalized)  Localized edema  Rationale for Evaluation and Treatment: Rehabilitation  ONSET DATE: scope + DCE/SAD 01/04/24 L shoulder  SUBJECTIVE:                                                                                                                                                                                      SUBJECTIVE STATEMENT: 05/22/2024: shoulder was doing real good til yesterday. Caught his shoulder on a railing and irritated it - felt instant pain, eased up after about 30 min. No issues after last session. Knees still bothering him (6-7/10), states he reached out to ortho  EVAL: Pt endorses chronic history with shoulder injuries, very active in sports, athletics, and [employer]. Worsening over past year prior to surgery. States since surgery pain has been steadily improving, is having difficulty w/ self care tasks, household tasks. Not performing heavier tasks around yard or home. Reports a bit of swelling in shoulder as expected, and bruising that is improving. States surgical follow up went well, reports being advised sling use PRN for comfort and no lifting 5# or greater. States yesterday he did remove one of his steri strips and it had some mild bleeding initially but no issues since. Denies constitutional symptoms (fevers, chills, SOB, etc). Has a bit of tingling in L lateral palm which he states is chronic, unchanged since surgery. Hand dominance: Right  PERTINENT HISTORY: DM, hx chest pain, cervical fusion, lumbar fusion (pt denies), HTN, hypothyroidism, LLE ulcer, R shoulder scope 2021, DM2, essential tremor,  diabetic retinopathy Self reports aortic aneurysm, describes as stable and follows with cardiology  PAIN:  Are you having pain: 0/10 at present, 0-7/10 in past week (updated 05/16/24)    Per eval:  Location/description: pinching in front of shoulder, posterior shoulder Best-worst over past week: 5-11.9/10  - aggravating factors: difficulty positioning while sitting, use of arm, picking  up pots/pans, opening jar - Easing factors: sling use, icing, posiitoning    PRECAUTIONS: s/p shoulder scope, SAD, DCE (no formal restrictions on referral).   RED FLAGS: No fevers/chills, no SOB or other constitutional symptoms  WEIGHT BEARING RESTRICTIONS: No  FALLS:  Has patient fallen in last 6 months? 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  DIAGNOSTIC FINDINGS:  S/p L shoulder debridement, SAD, DCE 01/04/24  PATIENT SURVEYS:  QuickDASH: 70.45%   02/21/24 quickDASH: 47.7%   04/18/24 quickdash: 25%   COGNITION: Overall cognitive status: Within functional limits for tasks assessed     SENSATION: Numbness L hand, chronic and stable, unchanging since surgery per pt report  EDEMA/INSPECTION:  Incisions well appearing without drainage or erythema, steri strips over posterior and lateral incisions, anterior does not have steri strip but no apparent opening of incision ; gross edema apparent about L GH joint WNL; mild bruising upper arm which pt states is improving since surgery  UPPER EXTREMITY ROM:  A/PROM Right eval Left eval Left 01/24/24 Left  01/31/24 L 02/07/24 L 02/09/24 L 02/16/24 L 02/21/24 L 02/28/24 L 03/06/24 L 03/13/24   L 04/18/24   L 05/22/24  Shoulder flexion A: 128 deg 91 deg passively A: 99 deg, mild pain on way down A: 120 deg  A: 125 deg AA: 134 deg  A: 135 deg *  A: 138 deg *   A: 131 deg*  AA: 141 deg A: 142 deg * A: 156 deg    Shoulder abduction       A: 98 deg pain eccentrically A: 99 deg *    A: 110 deg * A: 135 deg * A: 128 deg *  Shoulder internal rotation         Functional combo; upper glute Functional combo: belt line Functional combo: iliac crest FC: iliac crest   Shoulder external rotation         Functional combo; occiput Functional combo: C4 Functional combo: C4 FC: C4 AA: 50 deg  FC: C3/4  Elbow flexion               Elbow extension               Wrist flexion               Wrist extension                (Blank rows = not tested) (Key: WFL = within functional limits not formally assessed, * = concordant pain, s = stiffness/stretching sensation, NT = not tested)  Comments:    UPPER EXTREMITY MMT:  MMT Right eval Left eval 01/31/24 R / L 03/13/24 R/L  04/17/24 R/L   Shoulder flexion    5/4- 5/4 (mild pain)   Shoulder extension    5/4- * 5/4 (mild pain)  Shoulder abduction       Shoulder extension       Shoulder internal rotation       Shoulder external rotation       Elbow flexion  Elbow extension       Grip strength 55# 65# 55# / 80# 70# / 60#   (Blank rows = not tested)  (Key: WFL = within functional limits not formally assessed, * = concordant pain, s = stiffness/stretching sensation, NT = not tested)  Comments: MMT deferred given acuity of surgery   PALPATION:  TTP L UT, LS, rhomboid 04/18/24: globally tender L shoulder anteriorly, trigger points L LS/UT                                                                                                                               [facility] Adult PT Treatment:                                                DATE: 05/22/24 Therapeutic  Exercise: Seated OH press w/ cane x10, x12  cues for posture Standing shoulder abduction AAROM w/ cane 2x10 GB seated OH press LUE x10 cues for positioning   HEP update + education/handout  Neuromuscular re-ed: Standing serratus wall slide 2x12 cues for motor control  GB horizontal abduction x10 GB double ER + scap retraction 2x10 cues for posture Therapeutic Activity: ROM measurements + education, discussing functional capacity and progress over last couple weeks 2# DB shuffle into cabinet 2x62min LUE    [facility] Adult PT Treatment:                                                DATE: 05/17/24 Therapeutic Exercise: Supine cane chest press + OH pullover 2x15 Sidelying LUE horizontal abd at 90 deg flex 2x8  cues for comfortable ROM Cane assisted scaption, standing 3x8 cues for appropriate angle Green band tricep pull LUE 2x12 cues for elbow positioning HEP discussion/education  Neuromuscular re-ed: Standing serratus wall slides 3x8 cues for periscapular stability, tactile cues at serratus Small horizontal circles at ~110 deg LUE 2x8 cues for positioning, motor control     [facility] Adult PT Treatment:                                                DATE: 05/16/24 Therapeutic Exercise: Supine 2# DB flexion x12 cues for comfortable ROM  Supine serratus press unresisted x8, 2# x8 ; cues for scapular positioning and reduced elbow compensations YB horizontal abduction (band at wrist to accommodate L ring finger discomfort - hx trigger finger) Doorway rhomboid stretch 2x30sec cues for gentle stretch, comfortable ROM  HEP discussion/education + handout   Therapeutic Activity: 10# KB suitcase carry 2 laps BIL 10# front carry 2x2 laps  1# counter<>shelf (bottom shelf, about eye level) shuffle 2x30sec cues for pacing, 3# x30sec 1# counter<>top shelf shuffle x30sec     [facility] Adult PT Treatment:                                                DATE: 05/08/24 Therapeutic Exercise: Supine 3#  serratus press x10, x8 unweighted  Supine 3# shoulder flexion LUE 2x10 cues for comfortable ROM  RB mid row x12, green band x10 cues for mechanics and reduced elbow compensations Blue band shoulder ext + tricep pull x10 HEP discussion/education  Therapeutic Activity: 10# KB front carry 2 laps, 20# 2x1 laps 10# suitcase carry LUE 1 lap, 2 laps w/ slight abd     PATIENT EDUCATION: Education details: rationale for interventions, HEP  Person educated: Patient Education method: Explanation, Demonstration, Tactile cues, Verbal cues Education comprehension: verbalized understanding, returned demonstration, verbal cues required, tactile cues required, and needs further education       HOME EXERCISE PROGRAM: Access Code: [email or web address] URL: [email or web address] Date: 05/22/2024 Prepared by: Alm Jenny  Exercises - Standing Shoulder Abduction AAROM with Dowel  - 2-3 x daily - 7 x weekly - 1-2 sets - 8 reps - Seated Shoulder External Rotation AAROM with Dowel  - 2-3 x daily - 7 x weekly - 1-2 sets - 10 reps - Wall Clock with Theraband  - 3-4 x weekly - 2-3 sets - 8 reps - Shoulder External Rotation and Scapular Retraction with Resistance  - 3-4 x weekly - 1-2 sets - 10 reps - 3 sec hold - Standing Shoulder Horizontal Abduction with Resistance  - 3-4 x weekly - 2-3 sets - 10-12 reps - Seated Overhead Press  - 3-4 x weekly - 2-3 sets - 5 reps - Serratus Activation at Wall  - 3-4 x weekly - 2-3 sets - 8-12 reps  ASSESSMENT:  CLINICAL IMPRESSION:  05/22/2024: Pt arrives w/o shoulder pain, no issues after last session but does report some irritation yesterday (see subjective). Today we continue working on active mobility, shoulder stability exercises within pt tolerance. HEP update as above. Tolerates session well without adverse events, tendency for reduced stiffness and improved performance w/ repetition today. Recommend continuing along current POC in order to address relevant  deficits and improve functional tolerance. Pt departs today's session in no acute distress, all voiced questions/concerns addressed appropriately from PT perspective.      EVAL: Patient is a 73 y.o. gentleman who was seen today for physical therapy evaluation and treatment for L shoulder s/p debridement, SAD, DCE 01/04/24. No red flags or signs of post op complications today, did encourage monitoring of his incision as he states he removed anterior steri strip yesterday. Endorses difficulty with household tasks and limiting heavier tasks as expected post op. On exam he demonstrates limitations in passive mobility secondary to transient pain. Tolerates exam/HEP well overall without adverse event, HEP and education as above. Recommend trial of skilled PT to address aforementioned deficits with aim of improving functional tolerance and reducing pain with typical activities. Pt departs today's session in no acute distress, all voiced concerns/questions addressed appropriately from PT perspective.      OBJECTIVE IMPAIRMENTS: decreased activity tolerance, decreased endurance, decreased mobility, decreased ROM, decreased strength, impaired perceived functional ability, impaired UE functional use, improper body mechanics, postural dysfunction, and pain.   ACTIVITY LIMITATIONS: carrying, lifting, dressing,  reach over head, and hygiene/grooming  PARTICIPATION LIMITATIONS: meal prep, cleaning, laundry, driving, shopping, and community activity  PERSONAL FACTORS: Age, Time since onset of injury/illness/exacerbation, and 3+ comorbidities: hx chest pain, cervical fusion, lumbarfusion, HTN, hypothyroidism, LLE ulcer, R shoulder scope 2021, DM2, essential tremor, diabetic retinopathy are also affecting patient's functional outcome.   REHAB POTENTIAL: Good  CLINICAL DECISION MAKING: Stable/uncomplicated  EVALUATION COMPLEXITY: Low   GOALS:  SHORT TERM GOALS: Target date: 02/14/2024  Pt will demonstrate  appropriate understanding and performance of initially prescribed HEP in order to facilitate improved independence with management of symptoms.  Baseline: HEP established  02/14/24: reports good HEP adherence  Goal status: MET  2. Pt will report at least 25% improvement in overall pain levels over past week in order to facilitate improved tolerance to typical daily activities.   Baseline: 5-11.910 02/21/24: 0-8/10 03/13/24: 4-6/10 04/18/24: 0-6/10 Goal status: MET   LONG TERM GOALS: Target date: 05/30/2024  (Updated 04/18/24)   Pt will score less than or equal to 50% on Quick DASH in order to indicate reduced levels of disability due to shoulder pain (MDC 16-20pts).  Baseline: 70.45%   02/21/24: 47.7%  Goal status: MET  2.  Pt will demonstrate at least 130 degrees of active L shoulder elevation in order to demonstrate improved tolerance to functional movement patterns such as reaching overhead.  Baseline: see ROM chart above 02/21/24: see ROM chart above (flexion >130 deg, abduction <100) 03/13/24: see ROM chart above 04/18/24: see ROM chart above Goal status: MET  3.  Pt will demonstrate at least 4+/5 shoulder flex/abd MMT for improved symmetry of UE strength and improved tolerance to functional movements.  Baseline: deferred on eval given acuity of surgery 02/21/24: deferred given proximity to surgery 03/13/24: see MMT chart above 04/18/24: see MMT chart above Goal status: PROGRESSING  4. Pt will report at least 50% decrease in overall pain levels in past week in order to facilitate improved tolerance to basic ADLs/mobility.   Baseline: 5-11.9/10 02/21/24: 0-8/10 03/13/24: 4-6/10 04/18/24: 0-6/10 Goal status: PROGRESSING  5. Pt will endorse ability to mow lawn with less than 3 pt increase in pain in order to facilitate improved tolerance to usual tasks.  Baseline: unable to perform  02/21/24: not mowing lawn yet  03/13/24: was able to mow the lawn but did increase  pain  04/18/24: reports some increase in pain with yardwork  Goal status: ONGOING  6. Pt will endorse ability to perform usual self-care ADLs such as dressing/hygiene in order to facilitate improved return to PLOF.  Baseline: difficulty w/ upper body dressing, putting on deodorant  02/21/24: still having inc effort with deodorant, difficulty removing pullover shirts  03/13/24: improved tolerance w/ upper body dressing, difficulty w/ deodorant  04/18/24: difficulty with jackets, mild pain/difficulty with deodorant but reports improvement  Goal status: PROGRESSING  PLAN: (updated 04/18/24)  PT FREQUENCY: 1-2x/week  PT DURATION: 6 weeks  PLANNED INTERVENTIONS: 97164- PT Re-evaluation, 97750- Physical Performance Testing, 97110-Therapeutic exercises, 97530- Therapeutic activity, 97112- Neuromuscular re-education, 97535- Self Care, [id number]- Manual therapy, 97016- Vasopneumatic device, 79439 (1-2 muscles), 20561 (3+ muscles)- Dry Needling, Patient/Family education, Taping, Joint mobilization, Spinal mobilization, Scar mobilization, Cryotherapy, and Moist heat  PLAN FOR NEXT SESSION: continue working on active shoulder mobility, light GH strengthening (non-consecutive days) and periscapular strength/stability. Re-assessment vs DC next session   Alm DELENA Jenny PT, DPT 05/22/2024 10:15 AM  "

## 2024-05-22 ENCOUNTER — Encounter: Payer: Self-pay | Admitting: Physical Therapy

## 2024-05-22 ENCOUNTER — Ambulatory Visit: Admitting: Physical Therapy

## 2024-05-22 DIAGNOSIS — R600 Localized edema: Secondary | ICD-10-CM

## 2024-05-22 DIAGNOSIS — M6281 Muscle weakness (generalized): Secondary | ICD-10-CM

## 2024-05-22 DIAGNOSIS — M25512 Pain in left shoulder: Secondary | ICD-10-CM | POA: Diagnosis not present

## 2024-05-28 NOTE — Therapy (Signed)
 " OUTPATIENT PHYSICAL THERAPY TREATMENT   Patient Name: Gregory WELTZ [name] MRN: [id number] DOB:[birth year], 73 y.o., male Today's Date: 05/28/2024      END OF SESSION:           Past Medical History:  Diagnosis Date   Allergy not sure   Arteriosclerosis of coronary artery 04/05/2024   Arthritis    Chest pain    a. Normal cors 2001. b. Neg stress test 2012, 05/2014.   Diabetes mellitus without complication (HCC)    GERD (gastroesophageal reflux disease)    Hyperlipidemia    Hypertension    Hypertensive response to exercise    Hypothyroidism    Obesity    Obstructive sleep apnea    Currently untreated    PONV (postoperative nausea and vomiting)    Sleep apnea    +cpap   Ulcer of left lower extremity (HCC) 12/16/2022   Past Surgical History:  Procedure Laterality Date   CARDIAC CATHETERIZATION     CERVICAL FUSION     HERNIA REPAIR     KNEE ARTHROSCOPY  1984   both knee   [facility]   POSTERIOR LUMBAR FUSION 2 WITH HARDWARE REMOVAL Left 01/04/2024   Procedure: ARTHROSCOPY, SHOULDER WITH DEBRIDEMENT;  Surgeon: Jerri Kay HERO, MD;  Location: [facility];  Service: Orthopedics;  Laterality: Left;  LEFT SHOULDER ARTHROSCOPY, EXTENSIVE DEBRIDEMENT, DISTAL CLAVICLE EXCISION, SUBACROMIAL DECOMPRESSION   SHOULDER ARTHROSCOPY WITH SUBACROMIAL DECOMPRESSION Right 09/13/2019   Procedure: RIGHT SHOULDER ARTHROSCOPY WITH EXTENSIVE DEBRIDEMENT, SUBACROMIAL DECOMPRESSION,;  Surgeon: Vernetta Lonni GRADE, MD;  Location: [facility];  Service: Orthopedics;  Laterality: Right;   SPINE SURGERY  1st Sept 1977   2nd Feb 2011   Patient Active Problem List   Diagnosis Date Noted   Osteoarthritis 04/05/2024   Hypothyroidism 04/05/2024   Arteriosclerosis of coronary artery 04/05/2024   Acute right-sided low back pain without sciatica 04/05/2024   Arthritis of left acromioclavicular joint 01/04/2024   Diabetic retinopathy (HCC)  12/29/2023   On long term drug therapy 10/27/2023   Pure hypercholesterolemia 10/27/2023   Viral URI 06/28/2023   Splinter in skin 03/08/2023   COVID 02/02/2023   Bee sting reaction 01/11/2023   Ulcer of left lower extremity (HCC) 12/16/2022   Bilateral hand numbness 10/06/2022   Atypical pigmented lesion 09/29/2022   ED (erectile dysfunction) 09/29/2022   Fusion of spine of cervical region 09/14/2022   Type 2 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 10/30/2021   Exercise counseling 07/29/2021   Other specified counseling 07/29/2021   Primary open-angle glaucoma, bilateral, mild stage 07/29/2021   Diabetic neuropathy (HCC) 07/31/2020   Abdominal aortic aneurysm 07/31/2020   Insomnia 07/31/2020   GERD (gastroesophageal reflux disease) 01/09/2020   Impingement syndrome of left shoulder 08/02/2019   Interstitial pulmonary disease (HCC) 06/29/2018   Hypertension associated with diabetes (HCC) 07/13/2017   Benign essential tremor 07/13/2017   Kidney cyst, acquired 01/24/2017   Seborrheic keratoses 04/01/2016   Diverticulosis of colon without hemorrhage 02/26/2016   Anxiety state 10/02/2015   BPH (benign prostatic hyperplasia) 07/31/2015   Carpal tunnel syndrome, left upper limb 06/04/2015   Hypogonadotropic hypogonadism 03/06/2015   Vitamin D  deficiency 03/06/2015   History of pancreatitis 03/06/2015   Lumbar spondylosis 03/04/2015   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   Obstructive sleep apnea 05/15/2014   Hyperlipidemia 05/15/2014   Glaucoma  suspect 10/21/2011    PCP: Alvia Bring, DO  REFERRING PROVIDER: Jerri Kay HERO, MD  REFERRING DIAG: [phone] (ICD-10-CM) - Chronic left shoulder pain M75.42 (ICD-10-CM) - Impingement syndrome of left shoulder  THERAPY DIAG:  No diagnosis found.  Rationale for Evaluation and  Treatment: Rehabilitation  ONSET DATE: scope + DCE/SAD 01/04/24 L shoulder  SUBJECTIVE:                                                                                                                                                                                      SUBJECTIVE STATEMENT: 05/28/2024: ***  *** shoulder was doing real good til yesterday. Caught his shoulder on a railing and irritated it - felt instant pain, eased up after about 30 min. No issues after last session. Knees still bothering him (6-7/10), states he reached out to ortho  EVAL: Pt endorses chronic history with shoulder injuries, very active in sports, athletics, and [employer]. Worsening over past year prior to surgery. States since surgery pain has been steadily improving, is having difficulty w/ self care tasks, household tasks. Not performing heavier tasks around yard or home. Reports a bit of swelling in shoulder as expected, and bruising that is improving. States surgical follow up went well, reports being advised sling use PRN for comfort and no lifting 5# or greater. States yesterday he did remove one of his steri strips and it had some mild bleeding initially but no issues since. Denies constitutional symptoms (fevers, chills, SOB, etc). Has a bit of tingling in L lateral palm which he states is chronic, unchanged since surgery. Hand dominance: Right  PERTINENT HISTORY: DM, hx chest pain, cervical fusion, lumbar fusion (pt denies), HTN, hypothyroidism, LLE ulcer, R shoulder scope 2021, DM2, essential tremor, diabetic retinopathy Self reports aortic aneurysm, describes as stable and follows with cardiology  PAIN:  Are you having pain: 0/10 at present, 0-7/10 in past week (updated 05/29/24)  ***  Per eval:  Location/description: pinching in front of shoulder, posterior shoulder Best-worst over past week: 5-11.9/10  - aggravating factors: difficulty positioning while sitting, use of arm, picking up pots/pans,  opening jar - Easing factors: sling use, icing, posiitoning    PRECAUTIONS: s/p shoulder scope, SAD, DCE (no formal restrictions on referral).   RED FLAGS: No fevers/chills, no SOB or other constitutional symptoms  WEIGHT BEARING RESTRICTIONS: No  FALLS:  Has patient fallen in last 6 months? 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  DIAGNOSTIC FINDINGS:  S/p L shoulder debridement, SAD, DCE 01/04/24  PATIENT SURVEYS:  QuickDASH: 70.45%   02/21/24 quickDASH: 47.7%   04/18/24 quickdash: 25%   05/29/24 quickdash: ***   COGNITION: Overall cognitive status: Within functional limits for tasks assessed     SENSATION: Numbness L hand, chronic and stable, unchanging since surgery per pt report  EDEMA/INSPECTION:  Incisions well appearing without drainage or erythema, steri strips over posterior and lateral incisions, anterior does not have steri strip but no apparent opening of incision ; gross edema apparent about L GH joint WNL; mild bruising upper arm which pt states is improving since surgery  UPPER EXTREMITY ROM:  A/PROM Right eval Left eval Left 01/24/24 Left 01/31/24 L 02/07/24 L 02/09/24 L 02/16/24 L 02/21/24 L 02/28/24 L 03/06/24 L 03/13/24   L 04/18/24   L 05/22/24 L 05/29/24  Shoulder flexion A: 128 deg 91 deg passively A: 99 deg, mild pain on way down A: 120 deg  A: 125 deg AA: 134 deg  A: 135 deg *  A: 138 deg *   A: 131 deg*  AA: 141 deg A: 142 deg * A: 156 deg   ***  Shoulder abduction       A: 98 deg pain eccentrically A: 99 deg *     A: 110 deg * A: 135 deg * A: 128 deg *   Shoulder internal rotation         Functional combo; upper glute Functional combo: belt line Functional combo: iliac crest FC: iliac crest    Shoulder external rotation         Functional combo; occiput Functional combo: C4 Functional combo: C4 FC: C4 AA: 50 deg  FC: C3/4   Elbow flexion                Elbow extension                Wrist flexion                Wrist extension                 (Blank rows = not tested) (Key: WFL = within functional limits not formally assessed, * = concordant pain, s = stiffness/stretching sensation, NT = not tested)  Comments:    UPPER EXTREMITY MMT:  MMT Right eval Left eval 01/31/24 R / L 03/13/24 R/L  04/17/24 R/L  05/29/24 R/L  Shoulder flexion    5/4- 5/4 (mild pain)  ***  Shoulder extension    5/4- * 5/4 (mild pain)   Shoulder abduction        Shoulder extension        Shoulder internal rotation        Shoulder external rotation        Elbow flexion        Elbow extension        Grip strength 55# 65# 55# / 80# 70# / 60#    (Blank rows = not tested)  (Key: WFL = within functional limits not formally assessed, * = concordant pain, s = stiffness/stretching sensation, NT = not tested)  Comments: MMT deferred given acuity of surgery   PALPATION:  TTP L UT, LS, rhomboid 04/18/24: globally  tender L shoulder anteriorly, trigger points L LS/UT                                                                                                                              [facility] Adult PT Treatment:                                                DATE: 05/29/24 Therapeutic Exercise: *** Manual Therapy: *** Neuromuscular re-ed: *** Therapeutic Activity: *** Modalities: *** Self Care: ***    [facility] Adult PT Treatment:                                                DATE: 05/22/24 Therapeutic Exercise: Seated OH press w/ cane x10, x12  cues for posture Standing shoulder abduction AAROM w/ cane 2x10 GB  seated OH press LUE x10 cues for positioning   HEP update + education/handout  Neuromuscular re-ed: Standing serratus wall slide 2x12 cues for motor control  GB horizontal abduction x10 GB double ER + scap retraction 2x10 cues for posture Therapeutic Activity: ROM measurements + education, discussing functional capacity and progress over last couple weeks 2# DB shuffle into cabinet 2x78min LUE    [facility] Adult PT Treatment:                                                DATE: 05/17/24 Therapeutic Exercise: Supine cane chest press + OH pullover 2x15 Sidelying LUE horizontal abd at 90 deg flex 2x8  cues for comfortable ROM Cane assisted scaption, standing 3x8 cues for appropriate angle Green band tricep pull LUE 2x12 cues for elbow positioning HEP discussion/education  Neuromuscular re-ed: Standing serratus wall slides 3x8 cues for periscapular stability, tactile cues at serratus Small horizontal circles at ~110 deg LUE 2x8 cues for positioning, motor control     [facility] Adult PT Treatment:                                                DATE: 05/16/24 Therapeutic Exercise: Supine 2# DB flexion x12 cues for comfortable ROM  Supine serratus press unresisted x8, 2# x8 ; cues for scapular positioning and reduced elbow compensations YB horizontal abduction (band at wrist to accommodate L ring finger discomfort - hx trigger finger) Doorway rhomboid stretch 2x30sec cues for gentle stretch, comfortable ROM  HEP discussion/education + handout   Therapeutic Activity: 10# KB suitcase carry 2 laps  BIL 10# front carry 2x2 laps  1# counter<>shelf (bottom shelf, about eye level) shuffle 2x30sec cues for pacing, 3# x30sec 1# counter<>top shelf shuffle x30sec     [facility] Adult PT Treatment:                                                DATE: 05/08/24 Therapeutic Exercise: Supine 3# serratus press x10, x8 unweighted  Supine 3# shoulder flexion LUE 2x10 cues for comfortable ROM  RB mid row x12, green  band x10 cues for mechanics and reduced elbow compensations Blue band shoulder ext + tricep pull x10 HEP discussion/education  Therapeutic Activity: 10# KB front carry 2 laps, 20# 2x1 laps 10# suitcase carry LUE 1 lap, 2 laps w/ slight abd     PATIENT EDUCATION: Education details: rationale for interventions, HEP  Person educated: Patient Education method: Explanation, Demonstration, Tactile cues, Verbal cues Education comprehension: verbalized understanding, returned demonstration, verbal cues required, tactile cues required, and needs further education       HOME EXERCISE PROGRAM: Access Code: [email or web address] URL: [email or web address] Date: 05/22/2024 Prepared by: Alm Jenny  Exercises - Standing Shoulder Abduction AAROM with Dowel  - 2-3 x daily - 7 x weekly - 1-2 sets - 8 reps - Seated Shoulder External Rotation AAROM with Dowel  - 2-3 x daily - 7 x weekly - 1-2 sets - 10 reps - Wall Clock with Theraband  - 3-4 x weekly - 2-3 sets - 8 reps - Shoulder External Rotation and Scapular Retraction with Resistance  - 3-4 x weekly - 1-2 sets - 10 reps - 3 sec hold - Standing Shoulder Horizontal Abduction with Resistance  - 3-4 x weekly - 2-3 sets - 10-12 reps - Seated Overhead Press  - 3-4 x weekly - 2-3 sets - 5 reps - Serratus Activation at Wall  - 3-4 x weekly - 2-3 sets - 8-12 reps  ASSESSMENT:  CLINICAL IMPRESSION:  05/28/2024: ***  *** Pt arrives w/o shoulder pain, no issues after last session but does report some irritation yesterday (see subjective). Today we continue working on active mobility, shoulder stability exercises within pt tolerance. HEP update as above. Tolerates session well without adverse events, tendency for reduced stiffness and improved performance w/ repetition today. Recommend continuing along current POC in order to address relevant deficits and improve functional tolerance. Pt departs today's session in no acute distress, all voiced  questions/concerns addressed appropriately from PT perspective.      EVAL: Patient is a 73 y.o. gentleman who was seen today for physical therapy evaluation and treatment for L shoulder s/p debridement, SAD, DCE 01/04/24. No red flags or signs of post op complications today, did encourage monitoring of his incision as he states he removed anterior steri strip yesterday. Endorses difficulty with household tasks and limiting heavier tasks as expected post op. On exam he demonstrates limitations in passive mobility secondary to transient pain. Tolerates exam/HEP well overall without adverse event, HEP and education as above. Recommend trial of skilled PT to address aforementioned deficits with aim of improving functional tolerance and reducing pain with typical activities. Pt departs today's session in no acute distress, all voiced concerns/questions addressed appropriately from PT perspective.      OBJECTIVE IMPAIRMENTS: decreased activity tolerance, decreased endurance, decreased mobility, decreased ROM, decreased strength, impaired perceived functional ability, impaired UE functional use, improper body mechanics, postural  dysfunction, and pain.   ACTIVITY LIMITATIONS: carrying, lifting, dressing, reach over head, and hygiene/grooming  PARTICIPATION LIMITATIONS: meal prep, cleaning, laundry, driving, shopping, and community activity  PERSONAL FACTORS: Age, Time since onset of injury/illness/exacerbation, and 3+ comorbidities: hx chest pain, cervical fusion, lumbarfusion, HTN, hypothyroidism, LLE ulcer, R shoulder scope 2021, DM2, essential tremor, diabetic retinopathy are also affecting patient's functional outcome.   REHAB POTENTIAL: Good  CLINICAL DECISION MAKING: Stable/uncomplicated  EVALUATION COMPLEXITY: Low   GOALS:  SHORT TERM GOALS: Target date: 02/14/2024  Pt will demonstrate appropriate understanding and performance of initially prescribed HEP in order to facilitate improved  independence with management of symptoms.  Baseline: HEP established  02/14/24: reports good HEP adherence  Goal status: MET  2. Pt will report at least 25% improvement in overall pain levels over past week in order to facilitate improved tolerance to typical daily activities.   Baseline: 5-11.910 02/21/24: 0-8/10 03/13/24: 4-6/10 04/18/24: 0-6/10 Goal status: MET   LONG TERM GOALS: Target date: 05/30/2024  (Updated 04/18/24)   Pt will score less than or equal to 50% on Quick DASH in order to indicate reduced levels of disability due to shoulder pain (MDC 16-20pts).  Baseline: 70.45%   02/21/24: 47.7%  Goal status: MET  2.  Pt will demonstrate at least 130 degrees of active L shoulder elevation in order to demonstrate improved tolerance to functional movement patterns such as reaching overhead.  Baseline: see ROM chart above 02/21/24: see ROM chart above (flexion >130 deg, abduction <100) 03/13/24: see ROM chart above 04/18/24: see ROM chart above Goal status: MET  3.  Pt will demonstrate at least 4+/5 shoulder flex/abd MMT for improved symmetry of UE strength and improved tolerance to functional movements.  Baseline: deferred on eval given acuity of surgery 02/21/24: deferred given proximity to surgery 03/13/24: see MMT chart above 04/18/24: see MMT chart above 05/29/24: *** Goal status: ***  4. Pt will report at least 50% decrease in overall pain levels in past week in order to facilitate improved tolerance to basic ADLs/mobility.   Baseline: 5-11.9/10 02/21/24: 0-8/10 03/13/24: 4-6/10 04/18/24: 0-6/10 05/29/24: *** Goal status: ***  5. Pt will endorse ability to mow lawn with less than 3 pt increase in pain in order to facilitate improved tolerance to usual tasks.  Baseline: unable to perform  02/21/24: not mowing lawn yet  03/13/24: was able to mow the lawn but did increase pain  04/18/24: reports some increase in pain with yardwork  05/29/24: ***  Goal status:  ***  6. Pt will endorse ability to perform usual self-care ADLs such as dressing/hygiene in order to facilitate improved return to PLOF.  Baseline: difficulty w/ upper body dressing, putting on deodorant  02/21/24: still having inc effort with deodorant, difficulty removing pullover shirts  03/13/24: improved tolerance w/ upper body dressing, difficulty w/ deodorant  04/18/24: difficulty with jackets, mild pain/difficulty with deodorant but reports improvement  05/29/24: ***  Goal status: ***  PLAN: (updated 04/18/24)  PT FREQUENCY: 1-2x/week  PT DURATION: 6 weeks  PLANNED INTERVENTIONS: 97164- PT Re-evaluation, 97750- Physical Performance Testing, 97110-Therapeutic exercises, 97530- Therapeutic activity, 97112- Neuromuscular re-education, 97535- Self Care, [id number]- Manual therapy, 97016- Vasopneumatic device, 79439 (1-2 muscles), 20561 (3+ muscles)- Dry Needling, Patient/Family education, Taping, Joint mobilization, Spinal mobilization, Scar mobilization, Cryotherapy, and Moist heat  PLAN FOR NEXT SESSION: continue working on active shoulder mobility, light GH strengthening (non-consecutive days) and periscapular strength/stability. Re-assessment vs DC next session   Alm DELENA Jenny PT, DPT 05/28/2024 2:00  PM  "

## 2024-05-29 ENCOUNTER — Ambulatory Visit: Admitting: Physical Therapy

## 2024-05-29 ENCOUNTER — Encounter: Payer: Self-pay | Admitting: Physical Therapy

## 2024-05-29 DIAGNOSIS — M25512 Pain in left shoulder: Secondary | ICD-10-CM | POA: Diagnosis not present

## 2024-05-29 DIAGNOSIS — R600 Localized edema: Secondary | ICD-10-CM

## 2024-05-29 DIAGNOSIS — M6281 Muscle weakness (generalized): Secondary | ICD-10-CM

## 2024-06-05 ENCOUNTER — Encounter (HOSPITAL_BASED_OUTPATIENT_CLINIC_OR_DEPARTMENT_OTHER): Payer: Self-pay | Admitting: Orthopaedic Surgery

## 2024-06-21 ENCOUNTER — Ambulatory Visit (INDEPENDENT_AMBULATORY_CARE_PROVIDER_SITE_OTHER): Admitting: Physician Assistant

## 2024-06-21 DIAGNOSIS — Z9889 Other specified postprocedural states: Secondary | ICD-10-CM | POA: Diagnosis not present

## 2024-06-21 NOTE — Progress Notes (Signed)
 "  Post-Op Visit Note   Patient: Gregory CARRICO [name]           Date of Birth: 04-05-1951           MRN: [id number] Visit Date: 06/21/2024 PCP: Alvia Bring, DO   Assessment & Plan:  Chief Complaint:  Chief Complaint  Patient presents with   Left Shoulder - Follow-up    Left shoulder scope 01/04/2024   Visit Diagnoses:  1. History of arthroscopy of left shoulder     Plan: Patient is a pleasant 74 year old gentleman who comes in today approximately 5 months status post left shoulder arthroscopic debridement decompression.  He is doing much better.  He notes occasional discomfort with change in weather which is primarily relieved with Tylenol .  He finished PT at the end of December and is pleased with his progress.  Examination of his left shoulder reveals full forward flexion abduction.  He can only internally rotate to L5 but notes this has progressed quite a bit with PT.  He also notes that he was unable to do much more than this for several years prior to surgery.  Full strength.  He is neurovascular intact distally.  At this point, we will continue with his home exercise program.  Follow-up as needed.  Follow-Up Instructions: Return if symptoms worsen or fail to improve.   Orders:  No orders of the defined types were placed in this encounter.  No orders of the defined types were placed in this encounter.   Imaging: No new imaging  PMFS History: Patient Active Problem List   Diagnosis Date Noted   Osteoarthritis 04/05/2024   Hypothyroidism 04/05/2024   Arteriosclerosis of coronary artery 04/05/2024   Acute right-sided low back pain without sciatica 04/05/2024   Arthritis of left acromioclavicular joint 01/04/2024   Diabetic retinopathy (HCC) 12/29/2023   On long term drug therapy 10/27/2023   Pure hypercholesterolemia 10/27/2023   Viral URI 06/28/2023   Splinter in skin 03/08/2023   COVID 02/02/2023   Bee sting reaction 01/11/2023   Ulcer of left lower extremity  (HCC) 12/16/2022   Bilateral hand numbness 10/06/2022   Atypical pigmented lesion 09/29/2022   ED (erectile dysfunction) 09/29/2022   Fusion of spine of cervical region 09/14/2022   Type 2 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 10/30/2021   Exercise counseling 07/29/2021   Other specified counseling 07/29/2021   Primary open-angle glaucoma, bilateral, mild stage 07/29/2021   Diabetic neuropathy (HCC) 07/31/2020   Abdominal aortic aneurysm 07/31/2020   Insomnia 07/31/2020   GERD (gastroesophageal reflux disease) 01/09/2020   Impingement syndrome of left shoulder 08/02/2019   Interstitial pulmonary disease (HCC) 06/29/2018   Hypertension associated with diabetes (HCC) 07/13/2017   Benign essential tremor 07/13/2017   Kidney cyst, acquired 01/24/2017   Seborrheic keratoses 04/01/2016   Diverticulosis of colon without hemorrhage 02/26/2016   Anxiety state 10/02/2015   BPH (benign prostatic hyperplasia) 07/31/2015   Carpal tunnel syndrome, left upper limb 06/04/2015   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 Normal cors 2001. b. Neg stress test 2012, 05/2014.   Diabetes mellitus without complication (HCC)    GERD (gastroesophageal reflux disease)    Hyperlipidemia    Hypertension    Hypertensive response to exercise    Hypothyroidism    Obesity    Obstructive sleep apnea    Currently untreated    PONV (postoperative nausea and vomiting)    Sleep apnea     +cpap   Ulcer of left lower extremity (HCC) 12/16/2022    Family History  Problem Relation Age of Onset   Diabetes Mother    Heart disease Mother    Hypertension Mother    Stroke Mother    Heart attack Mother    COPD Mother    Cancer Father    Heart disease Father    Diabetes Father    Hyperlipidemia Sister    Hypertension Sister    Diabetes Maternal Grandmother    Stroke Maternal Grandmother    Heart attack Maternal Grandmother    Heart disease Maternal Grandmother    Diabetes Maternal Grandfather    Hypertension Maternal Grandfather    Heart attack Maternal Grandfather    Heart disease Maternal Grandfather    Diabetes Sister    Hypertension Sister    Cancer Sister    Hyperlipidemia Sister    Heart attack Paternal Grandmother    Heart attack Paternal Grandfather    Cancer Sister    Hyperlipidemia Sister    COPD Maternal Aunt    Hypertension Maternal Aunt    Diabetes Son    Diabetes Maternal Aunt     Past Surgical History:  Procedure Laterality Date   CARDIAC CATHETERIZATION     CERVICAL FUSION     HERNIA REPAIR     KNEE ARTHROSCOPY  1984   both knee   KN[facility] AR[facility]ROS[facility] Right [facility]   SHOULDER ARTHROSCOPY WITH SUBACROMIAL DECOMPRESSION Right 09/13/2019   Procedure: RIGHT SHOULDER ARTHROSCOPY WITH EXTENSIVE DEBRIDEMENT, SUBACROMIAL DECOMPRESSION,;  Surgeon: Vernetta Lonni GRADE, MD;  Location: [facility];  Service: Orthopedics;  Laterality: Right;   SPINE SURGERY  1st Sept 1977   2nd Feb 2011   Social History   Occupational History    Comment: Retired  Tobacco Use   Smoking status: Former    Current packs/day: 0.00    Average packs/day: 1.5 packs/day for 30.0 years (45.0 ttl pk-yrs)    Types: Cigarettes    Start date: 06/01/1963    Quit date: 05/31/1993    Years since quitting: 31.0   Smokeless tobacco: Never   Tobacco comments:    24 years smoke free and alcohol free  Vaping Use   Vaping status: Never Used  Substance and Sexual  Activity   Alcohol use: No   Drug use: No   Sexual activity: Yes    Birth control/protection: None    Comment: Not Necessary     "

## 2024-06-25 ENCOUNTER — Other Ambulatory Visit: Payer: Self-pay | Admitting: Family Medicine

## 2024-06-25 NOTE — Telephone Encounter (Signed)
 Requesting rx rf of Trazodone  50mg   Last written 11/22/2023 Last OV 04/05/2024 sick visit Upcoming appt 04/28/206 AWV

## 2024-09-25 ENCOUNTER — Ambulatory Visit

## 2024-11-16 ENCOUNTER — Ambulatory Visit: Admitting: Family Medicine
